# Patient Record
Sex: Female | Born: 1965 | Race: White | Hispanic: No | Marital: Married | Smoking: Never smoker
Health system: Southern US, Community
[De-identification: ages and names within clinical notes are randomized; demographics above are authoritative.]

## PROBLEM LIST (undated history)

## (undated) DIAGNOSIS — M1990 Unspecified osteoarthritis, unspecified site: Secondary | ICD-10-CM

## (undated) DIAGNOSIS — M797 Fibromyalgia: Secondary | ICD-10-CM

## (undated) DIAGNOSIS — K760 Fatty (change of) liver, not elsewhere classified: Secondary | ICD-10-CM

## (undated) DIAGNOSIS — E282 Polycystic ovarian syndrome: Secondary | ICD-10-CM

## (undated) DIAGNOSIS — I10 Essential (primary) hypertension: Secondary | ICD-10-CM

## (undated) DIAGNOSIS — J189 Pneumonia, unspecified organism: Secondary | ICD-10-CM

## (undated) DIAGNOSIS — F339 Major depressive disorder, recurrent, unspecified: Secondary | ICD-10-CM

## (undated) DIAGNOSIS — K829 Disease of gallbladder, unspecified: Secondary | ICD-10-CM

## (undated) DIAGNOSIS — E6609 Other obesity due to excess calories: Secondary | ICD-10-CM

## (undated) DIAGNOSIS — R112 Nausea with vomiting, unspecified: Secondary | ICD-10-CM

## (undated) DIAGNOSIS — E785 Hyperlipidemia, unspecified: Secondary | ICD-10-CM

## (undated) DIAGNOSIS — K869 Disease of pancreas, unspecified: Secondary | ICD-10-CM

## (undated) DIAGNOSIS — R0602 Shortness of breath: Secondary | ICD-10-CM

## (undated) DIAGNOSIS — Z9889 Other specified postprocedural states: Secondary | ICD-10-CM

## (undated) DIAGNOSIS — J45909 Unspecified asthma, uncomplicated: Secondary | ICD-10-CM

## (undated) DIAGNOSIS — F32A Depression, unspecified: Secondary | ICD-10-CM

## (undated) DIAGNOSIS — E119 Type 2 diabetes mellitus without complications: Secondary | ICD-10-CM

## (undated) DIAGNOSIS — F419 Anxiety disorder, unspecified: Secondary | ICD-10-CM

## (undated) DIAGNOSIS — E559 Vitamin D deficiency, unspecified: Secondary | ICD-10-CM

## (undated) DIAGNOSIS — M2550 Pain in unspecified joint: Secondary | ICD-10-CM

## (undated) HISTORY — DX: Hyperlipidemia, unspecified: E78.5

## (undated) HISTORY — DX: Disease of gallbladder, unspecified: K82.9

## (undated) HISTORY — DX: Fatty (change of) liver, not elsewhere classified: K76.0

## (undated) HISTORY — DX: Disease of pancreas, unspecified: K86.9

## (undated) HISTORY — PX: ABDOMINAL HYSTERECTOMY: SHX81

## (undated) HISTORY — DX: Major depressive disorder, recurrent, unspecified: F33.9

## (undated) HISTORY — DX: Vitamin D deficiency, unspecified: E55.9

## (undated) HISTORY — DX: Unspecified asthma, uncomplicated: J45.909

## (undated) HISTORY — DX: Unspecified osteoarthritis, unspecified site: M19.90

## (undated) HISTORY — DX: Other obesity due to excess calories: E66.09

## (undated) HISTORY — DX: Polycystic ovarian syndrome: E28.2

## (undated) HISTORY — PX: TOTAL ABDOMINAL HYSTERECTOMY: SHX209

## (undated) HISTORY — DX: Essential (primary) hypertension: I10

## (undated) HISTORY — DX: Shortness of breath: R06.02

## (undated) HISTORY — PX: REDUCTION MAMMAPLASTY: SUR839

## (undated) HISTORY — DX: Pain in unspecified joint: M25.50

## (undated) HISTORY — DX: Fibromyalgia: M79.7

## (undated) HISTORY — DX: Depression, unspecified: F32.A

---

## 1993-07-12 HISTORY — PX: GALLBLADDER SURGERY: SHX652

## 2003-03-13 ENCOUNTER — Encounter: Admission: RE | Admit: 2003-03-13 | Discharge: 2003-03-13 | Payer: Self-pay | Admitting: Family Medicine

## 2003-03-13 ENCOUNTER — Encounter: Payer: Self-pay | Admitting: Family Medicine

## 2003-06-18 ENCOUNTER — Encounter (INDEPENDENT_AMBULATORY_CARE_PROVIDER_SITE_OTHER): Payer: Self-pay

## 2003-06-18 ENCOUNTER — Inpatient Hospital Stay (HOSPITAL_COMMUNITY): Admission: RE | Admit: 2003-06-18 | Discharge: 2003-06-21 | Payer: Self-pay | Admitting: *Deleted

## 2003-06-18 ENCOUNTER — Encounter (INDEPENDENT_AMBULATORY_CARE_PROVIDER_SITE_OTHER): Payer: Self-pay | Admitting: *Deleted

## 2003-06-22 ENCOUNTER — Inpatient Hospital Stay (HOSPITAL_COMMUNITY): Admission: AD | Admit: 2003-06-22 | Discharge: 2003-06-22 | Payer: Self-pay | Admitting: Obstetrics and Gynecology

## 2003-07-13 LAB — HM COLONOSCOPY

## 2003-10-17 ENCOUNTER — Encounter: Admission: RE | Admit: 2003-10-17 | Discharge: 2003-10-17 | Payer: Self-pay | Admitting: Internal Medicine

## 2003-12-19 ENCOUNTER — Ambulatory Visit (HOSPITAL_COMMUNITY): Admission: RE | Admit: 2003-12-19 | Discharge: 2003-12-19 | Payer: Self-pay | Admitting: Obstetrics and Gynecology

## 2004-01-10 ENCOUNTER — Encounter: Admission: RE | Admit: 2004-01-10 | Discharge: 2004-01-10 | Payer: Self-pay | Admitting: Gastroenterology

## 2004-01-29 ENCOUNTER — Emergency Department (HOSPITAL_COMMUNITY): Admission: EM | Admit: 2004-01-29 | Discharge: 2004-01-30 | Payer: Self-pay | Admitting: Unknown Physician Specialty

## 2004-02-04 ENCOUNTER — Ambulatory Visit (HOSPITAL_COMMUNITY): Admission: RE | Admit: 2004-02-04 | Discharge: 2004-02-04 | Payer: Self-pay | Admitting: Gastroenterology

## 2004-02-04 ENCOUNTER — Encounter (INDEPENDENT_AMBULATORY_CARE_PROVIDER_SITE_OTHER): Payer: Self-pay | Admitting: *Deleted

## 2007-02-24 ENCOUNTER — Ambulatory Visit (HOSPITAL_COMMUNITY): Admission: RE | Admit: 2007-02-24 | Discharge: 2007-02-24 | Payer: Self-pay | Admitting: Emergency Medicine

## 2009-04-24 ENCOUNTER — Other Ambulatory Visit: Admission: RE | Admit: 2009-04-24 | Discharge: 2009-04-24 | Payer: Self-pay | Admitting: Family Medicine

## 2010-11-27 NOTE — Op Note (Signed)
NAME:  Megan Murray, Megan Murray                    ACCOUNT NO.:  [id number]   MEDICAL RECORD NO.:  [id number]                   PATIENT TYPE:  INP   LOCATION:  NA                                   FACILITY:  [facility]   PHYSICIAN:  Tracie Harrier, M.D.              DATE OF BIRTH:  02/28/1966   DATE OF PROCEDURE:  06/18/2003  DATE OF DISCHARGE:                                 OPERATIVE REPORT   PREOPERATIVE DIAGNOSES:  1. Right pelvic mass.  2. Abnormal uterine bleeding.   POSTOPERATIVE DIAGNOSIS:  Benign right ovarian mucinous cystadenoma.   PROCEDURES:  1. Total abdominal hysterectomy and right salpingo-oophorectomy.  2. Pelvic washings.   SURGEON:  Tracie Harrier, M.D.   ASSISTANTFreddy Finner, M.D.   ANESTHESIA:  General.   ESTIMATED BLOOD LOSS:  450 mL.   COMPLICATIONS:  None.   FINDINGS:  At time of laparotomy, a right ovarian neoplasm was encountered,  12 x 12 cm in size.  This was smooth-surfaced but complex on the inside.  Frozen section showed a benign mucinous cystadenoma.  The left ovary was  normal.  The uterus was likewise normal.   The abdomen otherwise was normal to gross inspection.  The appendix was not  visualized and appeared to be retrocecal.   DESCRIPTION OF PROCEDURE:  The patient was taken to the operating room,  where a general endotracheal anesthetic was administered.  The patient was  placed on the operating table, and the abdomen, perineum, and vagina were  prepped and draped in the usual sterile fashion with Betadine and sterile  drapes.  A Foley catheter was inserted.  The abdomen was entered through a  vertical incision and carried down sharply in the usual fashion.  The  peritoneum was atraumatically entered.  The above-noted findings were  encountered.  Pelvic washings were obtained.  The pelvic mass was gently  elevated into the incision.  The infundibulopelvic ligament was clamped and  the right ovary dissected free.  This was elevated  well away from the  ureter.  The infundibulopelvic ligament was then tied with two free ties of  0 Vicryl suture.  This was then submitted for frozen section.  Frozen  section showed a benign mucinous cystadenoma.  Attention was then turned to  hysterectomy.   The uterus was grasped in its cornual regions with two large Kelly clamps.  The retroperitoneal space was entered by transecting the round ligament.  The uterine-ovarian ligaments were then cauterized thoroughly using the  LigaSure device.  Successive bites were then carried down the body of the  uterus using the LigaSure device.  Once we were past the uterine artery  pedicles, curved Heaney clamps were then used to continue the hysterectomy  with dissection occurring along the border of the uterus.  Dissection was  carried out until the vagina was met.  All vascular pedicles were created  very close to the uterine body.  Vascular pedicles were ligated sharply and  suture ligated with 0 Vicryl suture until the vaginal angles were  encountered.  Curved Heaney clamps were then placed on the vaginal angles  and the uterus dissected free.  The vaginal angles were closed with  transfixing sutures of 0 Vicryl.  The vagina was closed with multiple  interrupted sutures of 0 Vicryl.  The pelvis was then thoroughly irrigated  and hemostasis achieved.  Again frozen section showed benign serous  cystadenoma.  The left ovary was normal.  Attention was then turned to  closure.  The pelvis was thoroughly irrigated with hemostasis noted.   All abdominal instruments and packs were removed.  The rectus muscle was  then closed with a running suture of 0 PDS.  The peritoneum was closed with  one ligature in the midline of the incision with 0 Vicryl prior to this.  The fascia was closed with two running sutures placed at the periphery of  the incision and with two running stitches meeting in the midline.  The  subcutaneous tissue was irrigated and made  hemostatic using the Bovie  cautery.  The subcu was then closed with three interrupted sutures of #1  Vicryl.  The skin was then reapproximated with staples.  The final sponge,  needle, and instrument count was correct x3.  The patient received a  preoperative antibiotic.  The patient was then awakened, extubated, and then  taken to the recovery room in good condition.  There were no perioperative  complications.                                               Tracie Harrier, M.D.    REG/MEDQ  D:  06/18/2003  T:  06/18/2003  Job:  [id number]

## 2010-11-27 NOTE — Discharge Summary (Signed)
NAME:  Megan Murray, Megan Murray                    ACCOUNT NO.:  [id number]   MEDICAL RECORD NO.:  [id number]                   PATIENT TYPE:  INP   LOCATION:  9311                                 FACILITY:  [facility]   PHYSICIAN:  Tracie Harrier, M.D.              DATE OF BIRTH:  10/28/1965   DATE OF ADMISSION:  06/18/2003  DATE OF DISCHARGE:  06/21/2003                                 DISCHARGE SUMMARY   HISTORY OF PRESENT ILLNESS:  Mrs. Megan Murray is a 45 year old female gravida 2  para 1 admitted for surgical intervention for a right pelvic mass.  She  underwent evaluation for this which revealed a benign process.  However,  this was a 13 cm right pelvic mass with a normal CA125.  She requested  abdominal hysterectomy at time of surgery.   PAST MEDICAL HISTORY AND SURGICAL HISTORY:  Please see clinic admission  History and Physical.   CURRENT MEDICATIONS:  1. Neurontin.  2. Ambien.  3. Elavil.  4. Zoloft.  5. Wellbutrin.  6. Albuterol p.r.n.   ALLERGIES:  None known.   PHYSICAL EXAMINATION:  Please see clinic admission History and Physical.   ADMITTING DIAGNOSIS:  Pelvic mass.   HOSPITAL COURSE:  The patient was admitted on the same day of surgery -  June 18, 2003 - where she underwent a total abdominal hysterectomy and  right salpingo-oophorectomy.  Intraoperative findings were consistent with a  benign mucinous cystadenoma.  A TAH and RSO was performed and went  uneventfully.   The patient's postoperative course was likewise uneventful.  She was slowly  advanced to a regular diet which she was tolerating well prior to discharge.  She quickly resumed normal bowel and bladder function.  Her hemoglobin  stabilized at 11.6.   Final pathology showed a benign right ovarian mucinous cystadenoma with no  evidence of carcinoma.  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   The patient did do well and was discharged routinely on postoperative day #3  in stable  condition.   DISCHARGE DIAGNOSIS:  Right pelvic mass, now status post total abdominal  hysterectomy and right salpingo-oophorectomy.   PLAN:  1. Home.  2. Percocet #50.  3. Motrin #30.  4. Routine postoperative instructions given.  5. Follow up in the office in 1 week for a routine postoperative checkup and     incision check.  She will have staples removed at that time.                                               Tracie Harrier, M.D.    REG/MEDQ  D:  07/24/2003  T:  07/24/2003  Job:  [id number]

## 2010-11-27 NOTE — Op Note (Signed)
NAME:  Megan Murray, Megan Murray                         ACCOUNT NO.:  [id number]   MEDICAL RECORD NO.:  [id number]                   PATIENT TYPE:  AMB   LOCATION:  ENDO                                 FACILITY:  [facility]   PHYSICIAN:  James L. Malon Kindle., M.D.          DATE OF BIRTH:  08-29-1965   DATE OF PROCEDURE:  02/04/2004  DATE OF DISCHARGE:                                 OPERATIVE REPORT   PROCEDURE:  Colonoscopy and biopsy.   MEDICATIONS:  Fentanyl 140 mcg and Versed 10 mg IV.   INDICATIONS FOR PROCEDURE:  Persistent abdominal pain and diarrhea.  This  was done to rule out inflammatory bowel disease.   DESCRIPTION OF PROCEDURE:  The procedure had been explained to the patient  and consent obtained.  Left lateral decubitus position, the Olympus scope  was inserted and advanced.  We were able to advance easily to the cecum.  The ileocecal valve and appendiceal orifice were seen and the scope was  withdrawn and the cecum, ascending colon, transverse colon, splenic flexure,  descending and sigmoid colon were seen well.  There were no polyps, no other  lesions.  The mucosa was completely endoscopically normal throughout,  although it was slightly friable.  Multiple random biopsies were obtained.  Rectum was free of polyps or other lesions.  There was no significant  diverticular disease.   ASSESSMENT:  Diarrhea, questionable cause, may be functional, 564.5.  Will  rule out microscopic colitis.   PLAN:  Will check pathology and see back in the office in four to six weeks.                                               James L. Malon Kindle., M.D.    Waldron Session  D:  02/04/2004  T:  02/04/2004  Job:  [id number]   cc:   Fayrene Fearing A. Ashley Royalty, M.D.  [address]  [place], [place] [address]  Fax: [phone]   Teena Irani. Arlyce Dice, M.D.  [address]  [place]  [place] [address]  Fax: [phone]

## 2010-11-27 NOTE — H&P (Signed)
NAME:  Megan, Murray NO.:  [id number]   MEDICAL RECORD NO.:  [id number]                   PATIENT TYPE:  INP   LOCATION:  NA                                   [facility]   PHYSICIAN:  Tracie Harrier, M.D.              DATE OF BIRTH:  05-05-1966   DATE OF ADMISSION:  06/17/2003  DATE OF DISCHARGE:                                HISTORY & PHYSICAL   HISTORY OF PRESENT ILLNESS:  [name] is a 45 year old female gravida 2,  para 1, admitted after clinical findings of a pelvic mass.  The patient was  seen by her local medical physician where she was having irregular menstrual  cycles.  Examination at that time showed a pelvic mass.  An ultrasound at  [facility] showed a right pelvic mass 13 cm in size.  This was a  complex mass of the right adnexa, possibly and ovarian neoplasm.  The  patient was seen in my office on June 12, 2003 where she underwent  discussion, review of labs, and CA-125.  Again there is a right pelvic mass 13 cm in size.  The CA-125 obtained was  normal at 24.   Different treatment options were reviewed with the patient.  This is a well  circumscribed entity with a normal CA-125, given her age it is unlikely to  be a carcinoma.  However her surgery will be approached with that  possibility in mind.   After discussion with the patient she is admitted for laparotomy with  vertical incision with right oophorectomy, frozen section and definitive  abdominal hysterectomy, possible left salpingo-oophorectomy as well.  Again  this clinical process discussed with her at length.   PAST MEDICAL HISTORY:  1. History of fibromyalgia.  2. History of depression.  3. History of irritable bowel syndrome.  4. History of asthma.   PAST SURGICAL HISTORY:  1. Cesarean section.  2. Breast reduction.  3. Cholecystectomy.  4. Sinus surgery.   OBSTETRIC HISTORY:  1. Cesarean section times one.  2. Abortion times one.   CURRENT MEDICATIONS:  1. Neurontin.  2. Ambien.  3. Elavil.  4. Zoloft.  5. Wellbutrin.  6. Albuterol p.r.n.   SOCIAL HISTORY:  Negative for alcohol or smoking.   ALLERGIES:  None known.   PHYSICAL EXAMINATION:  VITAL SIGNS:  Stable.  GENERAL:  She is a well-developed, well-nourished female in no acute  distress.  HEENT:  Within normal limits.  NECK:  Supple without adenopathy or thyromegaly.  HEART:  Regular rate and rhythm without murmur, rub, or gallop.  LUNGS:  Clear without wheezing.  BREAST EXAM:  Deferred.  ABDOMEN:  Soft and benign without masses, tenderness or organomegaly.  PELVIC EXAM:  Normal external female genitalia, vagina, cervix clear.  The  uterus is enlarged, it is difficult to ascertain what is enlarged but there  is an enlarged presence about 10 cm in size to the right.  The adnexa on the  left is clear.  EXTREMITIES:  Grossly normal.  NEUROLOGIC:  Grossly normal.   ADMISSION DIAGNOSIS:  Pelvic mass.   PLAN:  1. Laparotomy with right salpingo-oophorectomy and frozen section.  2. Then, abdominal hysterectomy.  3. Left salpingo-oophorectomy and surgical staging if malignancy.   DISCUSSION:  The risk and benefits as well as clinical implications of this  pelvic mass discussed.  Again I discussed the clinical approach to this.  She definitely wants abdominal hysterectomy which I will respect.  The risk  of bleeding, infection, risk of injury to surrounding organs and pulmonary  embolism and deep venous thrombosis discussed with her.  Questions were  answered regarding this surgery.  Her Pap smear is pending, however she has  no history of abnormal Pap smear.                                               Tracie Harrier, M.D.    REG/MEDQ  D:  06/17/2003  T:  06/18/2003  Job:  [id number]

## 2011-05-13 ENCOUNTER — Institutional Professional Consult (permissible substitution): Payer: Self-pay | Admitting: Internal Medicine

## 2012-06-19 ENCOUNTER — Other Ambulatory Visit: Payer: Self-pay | Admitting: Family Medicine

## 2012-06-19 DIAGNOSIS — Z1231 Encounter for screening mammogram for malignant neoplasm of breast: Secondary | ICD-10-CM

## 2012-07-06 ENCOUNTER — Ambulatory Visit: Payer: Self-pay

## 2012-07-31 ENCOUNTER — Ambulatory Visit
Admission: RE | Admit: 2012-07-31 | Discharge: 2012-07-31 | Disposition: A | Discharge status: Home / Self Care | Payer: Medicare Other | Source: Ambulatory Visit | Attending: Family Medicine | Admitting: Family Medicine

## 2012-07-31 DIAGNOSIS — Z1231 Encounter for screening mammogram for malignant neoplasm of breast: Secondary | ICD-10-CM

## 2012-09-05 ENCOUNTER — Other Ambulatory Visit: Payer: Self-pay | Admitting: Family Medicine

## 2012-09-05 DIAGNOSIS — R109 Unspecified abdominal pain: Secondary | ICD-10-CM

## 2012-09-05 DIAGNOSIS — R1031 Right lower quadrant pain: Secondary | ICD-10-CM

## 2012-09-06 ENCOUNTER — Ambulatory Visit
Admission: RE | Admit: 2012-09-06 | Discharge: 2012-09-06 | Disposition: A | Discharge status: Home / Self Care | Payer: Medicare Other | Source: Ambulatory Visit | Attending: Family Medicine | Admitting: Family Medicine

## 2012-09-06 ENCOUNTER — Other Ambulatory Visit: Payer: Self-pay | Admitting: Family Medicine

## 2012-09-06 DIAGNOSIS — R1031 Right lower quadrant pain: Secondary | ICD-10-CM

## 2012-09-06 DIAGNOSIS — R109 Unspecified abdominal pain: Secondary | ICD-10-CM

## 2012-09-06 MED ORDER — IOHEXOL 300 MG/ML  SOLN
100.0000 mL | Freq: Once | INTRAMUSCULAR | Status: AC | PRN
  Administered 2012-09-06: 100 mL via INTRAVENOUS

## 2013-08-02 ENCOUNTER — Ambulatory Visit (INDEPENDENT_AMBULATORY_CARE_PROVIDER_SITE_OTHER): Payer: Medicare Other

## 2013-08-02 ENCOUNTER — Ambulatory Visit (INDEPENDENT_AMBULATORY_CARE_PROVIDER_SITE_OTHER): Payer: Medicare Other | Admitting: Podiatrist

## 2013-08-02 ENCOUNTER — Encounter: Payer: Self-pay | Admitting: Podiatrist

## 2013-08-02 VITALS — BP 155/90 | HR 86 | Resp 12 | Ht 61.0 in | Wt 194.0 lb

## 2013-08-02 DIAGNOSIS — M722 Plantar fascial fibromatosis: Secondary | ICD-10-CM

## 2013-08-02 DIAGNOSIS — R52 Pain, unspecified: Secondary | ICD-10-CM

## 2013-08-02 NOTE — Progress Notes (Signed)
   Subjective:    Patient ID: Megan Murray, female    DOB: 04/28/1966, 48 y.o.   MRN: [id number]  HPI Comments: '' BOTH HEELS ARE SWOLLEN AND HURTING FOR 3 WEEKS. TREATMENT TRIED ORTHOTICS AND ALEVE FOR PAIN BUT NOTHING IS HELPING.''     Review of Systems  Constitutional: Positive for fatigue.  Musculoskeletal: Positive for joint swelling.  Neurological: Positive for weakness.  Psychiatric/Behavioral: The patient is nervous/anxious.   All other systems reviewed and are negative.       Objective:   Physical Exam GENERAL APPEARANCE: Alert, conversant. Appropriately groomed. No acute distress.  VASCULAR: Pedal pulses palpable and strong bilateral.  Capillary refill time is immediate to all digits,  Proximal to distal cooling it warm to warm.  Digital hair growth is present bilateral  NEUROLOGIC: sensation is intact epicritically and protectively to 5.07 monofilament at 5/5 sites bilateral.  Light touch is intact bilateral, vibratory sensation intact bilateral, achilles tendon reflex is intact bilateral.  MUSCULOSKELETAL: Pain on palpation plantar medial aspect of bilateral heels is noted consistent with plantar fasciitis symptomatology. Swelling is also present of bilateral heels.  DERMATOLOGIC: skin color, texture, and turger are within normal limits.  No preulcerative lesions are seen, no interdigital maceration noted.  No open lesions present.  Digital nails are asymptomatic.   X-rays demonstrate inferior and posterior calcaneal spurring bilateral.       Assessment & Plan:  Plantar fasciitis bilateral,   Discussed etiology, pathology, conservative vs. Surgical therapies and at this time a plantar fascial injection was recommended.  The patient agreed and a sterile skin prep was applied.  An injection consisting of kenalog and marcaine mixture was infiltrated at the point of maximal tenderness on the bilateral Heels.  The patient tolerated this well and was given instructions for  aftercare. She was then given instructions for stretching exercises and shoe gear changes. She has inserts from the [employer] store and I encouraged continued use. If these are not beneficial we will consider custom devices.

## 2013-08-02 NOTE — Patient Instructions (Signed)
Wear a good fitting shoe-  Running shoe brands I recommend are [employer], [employer] and [employer].  Always have a shoe fit specialist help you choose your shoes as there are many "varieties" of shoes and they can find you the best fit.  [employer] sports/ [employer] ([place], [place] [employer], [employer] [place] have trained staff to help you in this process.  The [employer] in [place] has a great selection of euro-comfort casual shoes with good comfort and support as well.  A night splint is also good for helping with plantar fasciitis  Plantar Fasciitis (Heel Spur Syndrome) with Rehab The plantar fascia is a fibrous, ligament-like, soft-tissue structure that spans the bottom of the foot. Plantar fasciitis is a condition that causes pain in the foot due to inflammation of the tissue. SYMPTOMS   Pain and tenderness on the underneath side of the foot.  Pain that worsens with standing or walking. CAUSES  Plantar fasciitis is caused by irritation and injury to the plantar fascia on the underneath side of the foot. Common mechanisms of injury include:  Direct trauma to bottom of the foot.  Damage to a small nerve that runs under the foot where the main fascia attaches to the heel bone. Stress placed on the plantar fascia due to any mild increased activity or injury RISK INCREASES WITH:   Obesity.  Poor strength and flexibility.  Improperly fitted shoes.  Tight calf muscles.  Flat feet.  Failure to warm-up properly before activity.  PREVENTION  Warm up and stretch properly before activity.  Strength, flexibility  Maintain a health body weight.  Avoid stress on the plantar fascia.  Wear properly fitted shoes, including arch supports for individuals who have flat feet. PROGNOSIS  If treated properly, then the symptoms of plantar fasciitis usually resolve without surgery. However, occasionally surgery is necessary. RELATED COMPLICATIONS    Recurrent symptoms that may result in a chronic condition.  Problems of the lower back that are caused by compensating for the injury, such as limping.  Pain or weakness of the foot during push-off following surgery.  Chronic inflammation, scarring, and partial or complete fascia tear, occurring more often from repeated injections. TREATMENT  Treatment initially involves the use of ice and medication to help reduce pain and inflammation. The use of strengthening and stretching exercises may help reduce pain with activity, especially stretches of the Achilles tendon.  Your caregiver may recommend that you use arch supports to help reduce stress on the plantar fascia. Often, corticosteroid injections are given to reduce inflammation. If symptoms persist for greater than 6 months despite non-surgical (conservative), then surgery may be recommended.  MEDICATION   If pain medication is necessary, then nonsteroidal anti-inflammatory medications, such as aspirin and ibuprofen, or other minor pain relievers, such as acetaminophen, are often recommended. Corticosteroid injections may be given by your caregiver.  HEAT AND COLD  Cold treatment (icing) relieves pain and reduces inflammation. Cold treatment should be applied for 10 to 15 minutes every 2 to 3 hours for inflammation and pain and immediately after any activity that aggravates your symptoms. Use ice packs or massage the area with a piece of ice (ice massage).  Heat treatment may be used prior to performing the stretching and strengthening activities prescribed by your caregiver, physical therapist, or athletic trainer. Use a heat pack or soak the injury in warm water. SEEK IMMEDIATE MEDICAL CARE IF:  Treatment seems to offer no benefit, or the condition worsens.  Any medications  produce adverse side effects.    EXERCISES-- perform each exercise a total of 10-15 repetitions.  Hold for 30 seconds and perform 3 times per day   RANGE OF  MOTION (ROM) AND STRETCHING EXERCISES - Plantar Fasciitis (Heel Spur Syndrome) These exercises may help you when beginning to rehabilitate your injury.   While completing these exercises, remember:   Restoring tissue flexibility helps normal motion to return to the joints. This allows healthier, less painful movement and activity.  An effective stretch should be held for at least 30 seconds.  A stretch should never be painful. You should only feel a gentle lengthening or release in the stretched tissue. RANGE OF MOTION - Toe Extension, Flexion  Sit with your right / left leg crossed over your opposite knee.  Grasp your toes and gently pull them back toward the top of your foot. You should feel a stretch on the bottom of your toes and/or foot.  Hold this stretch for __________ seconds.  Now, gently pull your toes toward the bottom of your foot. You should feel a stretch on the top of your toes and or foot.  Hold this stretch for __________ seconds. Repeat __________ times. Complete this stretch __________ times per day.  RANGE OF MOTION - Ankle Dorsiflexion, Active Assisted  Remove shoes and sit on a chair that is preferably not on a carpeted surface.  Place right / left foot under knee. Extend your opposite leg for support.  Keeping your heel down, slide your right / left foot back toward the chair until you feel a stretch at your ankle or calf. If you do not feel a stretch, slide your bottom forward to the edge of the chair, while still keeping your heel down.  Hold this stretch for __________ seconds. Repeat __________ times. Complete this stretch __________ times per day.  STRETCH  Gastroc, Standing  Place hands on wall.  Extend right / left leg, keeping the front knee somewhat bent.  Slightly point your toes inward on your back foot.  Keeping your right / left heel on the floor and your knee straight, shift your weight toward the wall, not allowing your back to arch.  You  should feel a gentle stretch in the right / left calf. Hold this position for __________ seconds. Repeat __________ times. Complete this stretch __________ times per day. STRETCH  Soleus, Standing  Place hands on wall.  Extend right / left leg, keeping the other knee somewhat bent.  Slightly point your toes inward on your back foot.  Keep your right / left heel on the floor, bend your back knee, and slightly shift your weight over the back leg so that you feel a gentle stretch deep in your back calf.  Hold this position for __________ seconds. Repeat __________ times. Complete this stretch __________ times per day. STRETCH  Gastrocsoleus, Standing  Note: This exercise can place a lot of stress on your foot and ankle. Please complete this exercise only if specifically instructed by your caregiver.   Place the ball of your right / left foot on a step, keeping your other foot firmly on the same step.  Hold on to the wall or a rail for balance.  Slowly lift your other foot, allowing your body weight to press your heel down over the edge of the step.  You should feel a stretch in your right / left calf.  Hold this position for __________ seconds.  Repeat this exercise with a slight bend in  your right / left knee. Repeat __________ times. Complete this stretch __________ times per day.  STRENGTHENING EXERCISES - Plantar Fasciitis (Heel Spur Syndrome)  These exercises may help you when beginning to rehabilitate your injury. They may resolve your symptoms with or without further involvement from your physician, physical therapist or athletic trainer. While completing these exercises, remember:   Muscles can gain both the endurance and the strength needed for everyday activities through controlled exercises.  Complete these exercises as instructed by your physician, physical therapist or athletic trainer. Progress the resistance and repetitions only as guided.

## 2013-08-24 ENCOUNTER — Ambulatory Visit (INDEPENDENT_AMBULATORY_CARE_PROVIDER_SITE_OTHER): Payer: Medicare Other | Admitting: Podiatrist

## 2013-08-24 ENCOUNTER — Encounter: Payer: Self-pay | Admitting: Podiatrist

## 2013-08-24 VITALS — BP 128/80 | HR 86 | Resp 12

## 2013-08-24 DIAGNOSIS — M722 Plantar fascial fibromatosis: Secondary | ICD-10-CM

## 2013-08-24 NOTE — Progress Notes (Signed)
''   BOTH FOOT ARE STILL HURTING.''  Patent presents for follow up plantar fasciisit bilateral.  States she still has pain but it is much improved from injection therapy.  Relates continued pain with first step in morning.  Objective:  Neurovascular status unchanged and intact.  Continued plantar fasciitis symptomatology is present bilaterally.    Assessment:  Plantar fasciitis  Plan:  Injected bilateral heels with kenalog and marcaine mix for injection 2.  Recommended a night splint as well as an ice pack and continueation of stretching exercises.  Discussed the positive Bacci term benefits of orthotics. Will verify orthotic coverage and notify patient.  She will be seen back for follow up in 3 weeks.

## 2013-08-24 NOTE — Progress Notes (Deleted)
Injections on both heels, night splint with ice pack

## 2013-09-19 ENCOUNTER — Ambulatory Visit: Payer: Medicare Other | Admitting: Podiatrist

## 2013-11-26 ENCOUNTER — Encounter: Payer: Self-pay | Admitting: Podiatry

## 2013-11-26 ENCOUNTER — Ambulatory Visit (INDEPENDENT_AMBULATORY_CARE_PROVIDER_SITE_OTHER): Payer: Medicare Other | Admitting: Podiatry

## 2013-11-26 VITALS — BP 149/87 | HR 74 | Resp 16

## 2013-11-26 DIAGNOSIS — M722 Plantar fascial fibromatosis: Secondary | ICD-10-CM

## 2013-11-26 NOTE — Progress Notes (Signed)
Subjective:     Patient ID: Megan SalesLaurie K Llorente, female   DOB: 01/30/1966, 48 y.o.   MRN: [id number]  HPI patient presents stating my heel started to hurt me in the last week and I'm not sure what I've done. They have never really gotten better I feel like I don't have enough support and the night splint helps 1 foot at a needed for both   Review of Systems     Objective:   Physical Exam Neurovascular status intact with no health history changes noted in patient's noted to have pain in the plantar of both heels with slight discoloration on the medial side spontaneously on both heels.    Assessment:     Chronic plantar fasciitis which has not responded so far to conservative care    Plan:     I don't believe these are reactions to steroid injections but I cannot take that chance I'm not going to reinject them. I dispensed a second night splint scanned for customized orthotics and discussed Chohan-term possibility for shockwave therapy which I think would be an excellent modality for this patient

## 2013-11-27 ENCOUNTER — Telehealth: Payer: Self-pay | Admitting: *Deleted

## 2013-11-27 NOTE — Telephone Encounter (Signed)
Message copied by Enedina FinnerMEADOWS, Boden Stucky J on Tue Nov 27, 2013  3:59 PM ------      Message from: Darreld McleanSMITH, JESSICA M      Created: Tue Nov 27, 2013  1:15 PM      Regarding: Patient in Severe Pain      Contact: [phone]       Patient saw Dr. Charlsie Merlesegal yesterday. States she is in severe pain. Requests a call back today. ------

## 2013-11-27 NOTE — Telephone Encounter (Signed)
i can call her in a prednisone dose kit for her-- I don't do narcotic for plantar fasciitis (but I don't think she is asking for that anyway)  Ask if she has ever had a problem with prednisone pills and if not, call in a prednisone 10 mg taper kit.  That will be immediate relief until she can get the inserts back.    I couldn't really tell if she got an injection from her note-- but it may be a steroid flare if she did and that would go away by tomorrow.

## 2013-11-27 NOTE — Telephone Encounter (Signed)
Yesterday they worked me in.  I have bruises on the bottom of both of my feet.  I saw Dr. Charlsie Merlesegal.  I'm in worse pain today.  I can't wait 7 weeks, he said he wants me to get orthotics but it will take 2 weeks to get them.  Then he wants me to wear them for 5 weeks.  I had to leave work today they hurt so bad.  I feel like he was just blowing me off.  I feel like I wasted a $50 co-pay because I left out of there knowing nothing different.   I have Fibromyalgia on top of the Plantar Fasciitis.  I take Tramadol for it already.  Is there something else that Dr. Irving ShowsEgerton can do for me, she's who I normally see?  I have to have some type of relief, I can't go to work like this.  I told her I would try and get in contact with Dr. Irving ShowsEgerton and get back with her with a response.

## 2013-11-27 NOTE — Telephone Encounter (Signed)
I called and gave her Dr. Faylene MillionEgerton's response.  She stated she's Diabetic,  is it okay for her to take a steroid?  I told her I would have to check with Dr. Irving ShowsEgerton and call her back.

## 2013-11-27 NOTE — Telephone Encounter (Signed)
Its OK but not ideal--- ask if her blood sugar is OK--  Instead of the 10's kit, please call in the prednisone 5 mg and just have her take 1 tab daily for 5 days   OR-- we can up her tramadol.Marland Kitchen

## 2013-11-28 MED ORDER — METHYLPREDNISOLONE 4 MG PO TABS: 4.0000 mg | ORAL_TABLET | Freq: Every day | ORAL | Status: DC

## 2013-11-28 NOTE — Telephone Encounter (Signed)
I called and informed the patient that Dr. Irving ShowsEgerton said if her glucose is under control it should be okay.  She stated it's under control with medicine.  I told her we would send the prescription in to the pharmacy.  She said to go ahead but she's going to check with her doctor before she takes the prednisone.  I told her that's fine.

## 2013-12-10 ENCOUNTER — Other Ambulatory Visit: Payer: Medicare Other

## 2014-01-02 ENCOUNTER — Ambulatory Visit: Payer: Medicare Other | Admitting: Podiatrist

## 2014-07-24 ENCOUNTER — Other Ambulatory Visit: Payer: Self-pay

## 2014-07-24 DIAGNOSIS — Z1231 Encounter for screening mammogram for malignant neoplasm of breast: Secondary | ICD-10-CM

## 2014-08-02 ENCOUNTER — Ambulatory Visit: Payer: Self-pay

## 2014-08-06 ENCOUNTER — Ambulatory Visit: Payer: Self-pay

## 2015-01-23 DIAGNOSIS — E538 Deficiency of other specified B group vitamins: Secondary | ICD-10-CM

## 2015-01-23 HISTORY — DX: Deficiency of other specified B group vitamins: E53.8

## 2015-07-09 ENCOUNTER — Encounter (HOSPITAL_COMMUNITY): Payer: Self-pay | Admitting: Emergency Medicine

## 2015-07-09 ENCOUNTER — Emergency Department (HOSPITAL_COMMUNITY): Payer: Medicare Other

## 2015-07-09 ENCOUNTER — Emergency Department (HOSPITAL_COMMUNITY)
Admission: EM | Admit: 2015-07-09 | Discharge: 2015-07-09 | Disposition: A | Discharge status: Home / Self Care | Payer: Medicare Other | Attending: Emergency Medicine | Admitting: Emergency Medicine

## 2015-07-09 DIAGNOSIS — Z7952 Long term (current) use of systemic steroids: Secondary | ICD-10-CM | POA: Diagnosis not present

## 2015-07-09 DIAGNOSIS — W010XXA Fall on same level from slipping, tripping and stumbling without subsequent striking against object, initial encounter: Secondary | ICD-10-CM | POA: Insufficient documentation

## 2015-07-09 DIAGNOSIS — Y998 Other external cause status: Secondary | ICD-10-CM | POA: Diagnosis not present

## 2015-07-09 DIAGNOSIS — Y9289 Other specified places as the place of occurrence of the external cause: Secondary | ICD-10-CM | POA: Insufficient documentation

## 2015-07-09 DIAGNOSIS — Y9389 Activity, other specified: Secondary | ICD-10-CM | POA: Insufficient documentation

## 2015-07-09 DIAGNOSIS — Z7984 Long term (current) use of oral hypoglycemic drugs: Secondary | ICD-10-CM | POA: Insufficient documentation

## 2015-07-09 DIAGNOSIS — E119 Type 2 diabetes mellitus without complications: Secondary | ICD-10-CM | POA: Insufficient documentation

## 2015-07-09 DIAGNOSIS — S6992XA Unspecified injury of left wrist, hand and finger(s), initial encounter: Secondary | ICD-10-CM | POA: Insufficient documentation

## 2015-07-09 HISTORY — DX: Type 2 diabetes mellitus without complications: E11.9

## 2015-07-09 MED ORDER — ACETAMINOPHEN 325 MG PO TABS
650.0000 mg | ORAL_TABLET | Freq: Once | ORAL | Status: AC
  Administered 2015-07-09: 650 mg via ORAL
  Filled 2015-07-09: qty 2

## 2015-07-09 MED ORDER — OXYCODONE-ACETAMINOPHEN 5-325 MG PO TABS
1.0000 | ORAL_TABLET | Freq: Once | ORAL | Status: DC
  Filled 2015-07-09: qty 1

## 2015-07-09 MED ORDER — IBUPROFEN 800 MG PO TABS: 800.0000 mg | ORAL_TABLET | Freq: Three times a day (TID) | ORAL | Status: DC

## 2015-07-09 MED ORDER — CYCLOBENZAPRINE HCL 10 MG PO TABS: 5.0000 mg | ORAL_TABLET | Freq: Two times a day (BID) | ORAL | Status: DC | PRN

## 2015-07-09 NOTE — ED Notes (Signed)
Tripped over chainsaw in garage , fell onto concrete floor-- left wrist hurts, slight swelling, positive radial pulse, brisk cap refill-- abrasion to left knee-- pt is ambulatory.

## 2015-07-09 NOTE — ED Notes (Signed)
Ice applied to left wrist

## 2015-07-09 NOTE — ED Notes (Signed)
Pt states she cannot take Codeine, "not even synthetic codeine".

## 2015-07-09 NOTE — Discharge Instructions (Signed)
Wrist Sprain °A wrist sprain is a stretch or tear in the strong, fibrous tissues (ligaments) that connect your wrist bones. The ligaments of your wrist may be easily sprained. There are three types of wrist sprains. °· Grade 1. The ligament is not stretched or torn, but the sprain causes pain. °· Grade 2. The ligament is stretched or partially torn. You may be able to move your wrist, but not very much. °· Grade 3. The ligament or muscle completely tears. You may find it difficult or extremely painful to move your wrist even a little. °CAUSES °Often, wrist sprains are a result of a fall or an injury. The force of the impact causes the fibers of your ligament to stretch too much or tear. Common causes of wrist sprains include: °· Overextending your wrist while catching a ball with your hands. °· Repetitive or strenuous extension or bending of your wrist. °· Landing on your hand during a fall. °RISK FACTORS °· Having previous wrist injuries. °· Playing contact sports, such as boxing or wrestling. °· Participating in activities in which falling is common. °· Having poor wrist strength and flexibility. °SIGNS AND SYMPTOMS °· Wrist pain. °· Wrist tenderness. °· Inflammation or bruising of the wrist area. °· Hearing a "pop" or feeling a tear at the time of the injury. °· Decreased wrist movement due to pain, stiffness, or weakness. °DIAGNOSIS °Your health care provider will examine your wrist. In some cases, an X-ray will be taken to make sure you did not break any bones. If your health care provider thinks that you tore a ligament, he or she may order an MRI of your wrist. °TREATMENT °Treatment involves resting and icing your wrist. You may also need to take pain medicines to help lessen pain and inflammation. Your health care provider may recommend keeping your wrist still (immobilized) with a splint to help your sprain heal. When the splint is no longer necessary, you may need to perform strengthening and stretching  exercises. These exercises help you to regain strength and full range of motion in your wrist. Surgery is not usually needed for wrist sprains unless the ligament completely tears. °HOME CARE INSTRUCTIONS °· Rest your wrist. Do not do things that cause pain. °· Wear your wrist splint as directed by your health care provider. °· Take medicines only as directed by your health care provider. °· To ease pain and swelling, apply ice to the injured area. °¨ Put ice in a plastic bag. °¨ Place a towel between your skin and the bag. °¨ Leave the ice on for 20 minutes, 2-3 times a day. °SEEK MEDICAL CARE IF: °· Your pain, discomfort, or swelling gets worse even with treatment. °· You feel sudden numbness in your hand. °  °This information is not intended to replace advice given to you by your health care provider. Make sure you discuss any questions you have with your health care provider. °  °Document Released: 03/01/2014 Document Reviewed: 03/01/2014 °Elsevier Interactive Patient Education ©2016 [employer]vier Inc[employer] ° °

## 2015-07-09 NOTE — ED Notes (Signed)
Ortho Tech called. 

## 2015-07-09 NOTE — Progress Notes (Signed)
Orthopedic Tech Progress Note Patient Details:  Megan Murray 10/31/1965 [id number]  Ortho Devices Type of Ortho Device: Ace wrap, Thumb spica splint, Volar splint Splint Material: Plaster Ortho Device/Splint Location: LUE Ortho Device/Splint Interventions: Ordered, Application   Jennye MoccasinHughes, Megan Murray 07/09/2015, 3:26 PM

## 2015-07-09 NOTE — ED Provider Notes (Signed)
CSN: [id number]     Arrival date & time 07/09/15  1246 History  By signing my name below, I, Essence Howell, attest that this documentation has been prepared under the direction and in the presence of Marlon Pel, PA-C Electronically Signed: Charline Bills, ED Scribe 07/09/2015 at 3:05 PM.   Chief Complaint  Patient presents with  . Wrist Injury   The history is provided by the patient. No language interpreter was used.   HPI Comments: Megan Murray is a 49 y.o. female who presents to the Emergency Department complaining of a fall that occurred approximately 2 hours ago. Pt states that she tripped over a chainsaw and landed on concrete with both hands and knees. She denies head injury or LOC. Pt reports constant left wrist pain that is exacerbated with palpation and moving her fingers. She also reports associated swelling to her left wrist and an abrasion to her left knee. Pt has tried ice since the incident. She denies elbow pain. Pt's tetanus is UTD. Allergy to Codeine.   Past Medical History  Diagnosis Date  . Diabetes mellitus without complication [facility])    Past Surgical History  Procedure Laterality Date  . Abdominal hysterectomy     No family history on file. Social History  Substance Use Topics  . Smoking status: Never Smoker   . Smokeless tobacco: None  . Alcohol Use: No   OB History    No data available     Review of Systems  Musculoskeletal: Positive for joint swelling and arthralgias.  Skin: Positive for wound.  All other systems reviewed and are negative.  Allergies  Codeine  Home Medications   Prior to Admission medications   Medication Sig Start Date End Date Taking? Authorizing Provider  cyclobenzaprine (FLEXERIL) 10 MG tablet Take 0.5-1 tablets (5-10 mg total) by mouth 2 (two) times daily as needed. 07/09/15   Yuriel Lopezmartinez Neva Seat, PA-C  estradiol (ESTRACE) 0.1 MG/GM vaginal cream Place 1 Applicatorful vaginally at bedtime.    Historical Provider, MD   ibuprofen (ADVIL,MOTRIN) 800 MG tablet Take 1 tablet (800 mg total) by mouth 3 (three) times daily. 07/09/15   Marlon Pel, PA-C  JARDIANCE 10 MG TABS  07/13/13   Historical Provider, MD  metFORMIN (GLUCOPHAGE) 500 MG tablet Take by mouth 2 (two) times daily with a meal.    Historical Provider, MD  methylPREDNISolone (MEDROL) 4 MG tablet Take 1 tablet (4 mg total) by mouth daily. 11/28/13   Delories Heinz, DPM  VICTOZA 18 MG/3ML SOPN  07/12/13   Historical Provider, MD  zolpidem (AMBIEN) 10 MG tablet  07/22/13   Historical Provider, MD   BP 158/86 mmHg  Pulse 97  Temp(Src) 99.2 F (37.3 C)  SpO2 97% Physical Exam  Constitutional: She is oriented to person, place, and time. She appears well-developed and well-nourished. No distress.  HENT:  Head: Normocephalic and atraumatic.  Eyes: Conjunctivae and EOM are normal.  Neck: Neck supple. No tracheal deviation present.  Cardiovascular: Normal rate.   Pulmonary/Chest: Effort normal. No respiratory distress.  Musculoskeletal: Normal range of motion.  Pain with ROM of left wrist. Will apply a small amount of flexion and extension to the thumb. Tenderness with movement of all 5 fingers. Tenderness is the most severe at the proximal interphalangeal joint. No ecchymosis, strong radial pulse, no deformity. Sensation intact.  Neurological: She is alert and oriented to person, place, and time.  Skin: Skin is warm and dry.  Psychiatric: She has a normal mood and affect.  Her behavior is normal.  Nursing note and vitals reviewed.  ED Course  Procedures (including critical care time) DIAGNOSTIC STUDIES: Oxygen Saturation is 97% on RA, normal by my interpretation.    COORDINATION OF CARE: 2:46 PM-Discussed treatment plan which includes XR, splint and Tylenol with pt at bedside and pt agreed to plan.   Labs Review Labs Reviewed - No data to display  Imaging Review Dg Wrist Complete Left  07/09/2015  CLINICAL DATA:  Tripped and fell over a  chain saw in her garage today, felt to her knees, caught herself with both hands, severe lateral LEFT wrist pain extending to thumb, initial encounter EXAM: LEFT WRIST - COMPLETE 3+ VIEW COMPARISON:  None FINDINGS: Osseous mineralization normal. Upper normal scapholunate interval. Joint spaces otherwise preserved. No acute fracture, dislocation, or bone destruction. IMPRESSION: No acute osseous abnormalities. Electronically Signed   By: Ulyses SouthwardMark  Boles M.D.   On: 07/09/2015 14:26   I have personally reviewed and evaluated these images and lab results as part of my medical decision-making.   EKG Interpretation None      MDM   Final diagnoses:  Wrist injury, left, initial encounter   Patient X-Ray negative of left wrist for obvious fracture or dislocation. Pain managed in ED. Pt advised to follow up with orthopedics if symptoms persist for possibility of missed fracture diagnosis. Patient given a plaster  Volar/thumb spica splint while in ED since clinically she her exam raises concern for occult fracture. Conservative therapy recommended and discussed. Patient will be dc home & is agreeable with above plan.  I personally performed the services described in this documentation, which was scribed in my presence. The recorded information has been reviewed and is accurate.     Marlon Peliffany Algis Lehenbauer, PA-C 07/09/15 1520  Raeford RazorStephen Kohut, MD 07/10/15 [phone]

## 2015-07-13 LAB — HM PAP SMEAR

## 2015-07-29 DIAGNOSIS — S60212A Contusion of left wrist, initial encounter: Secondary | ICD-10-CM | POA: Insufficient documentation

## 2015-07-29 HISTORY — DX: Contusion of left wrist, initial encounter: S60.212A

## 2015-08-29 ENCOUNTER — Ambulatory Visit
Admission: RE | Admit: 2015-08-29 | Discharge: 2015-08-29 | Disposition: A | Discharge status: Home / Self Care | Payer: Medicare Other | Source: Ambulatory Visit

## 2015-08-29 DIAGNOSIS — Z1231 Encounter for screening mammogram for malignant neoplasm of breast: Secondary | ICD-10-CM

## 2016-10-01 ENCOUNTER — Other Ambulatory Visit: Payer: Self-pay | Admitting: Family Medicine

## 2016-10-01 DIAGNOSIS — Z1231 Encounter for screening mammogram for malignant neoplasm of breast: Secondary | ICD-10-CM

## 2016-10-25 ENCOUNTER — Ambulatory Visit
Admission: RE | Admit: 2016-10-25 | Discharge: 2016-10-25 | Disposition: A | Discharge status: Home / Self Care | Payer: Medicare Other | Source: Ambulatory Visit | Attending: Family Medicine | Admitting: Family Medicine

## 2016-10-25 DIAGNOSIS — Z1231 Encounter for screening mammogram for malignant neoplasm of breast: Secondary | ICD-10-CM

## 2016-10-25 LAB — HM MAMMOGRAPHY

## 2016-11-04 ENCOUNTER — Ambulatory Visit (INDEPENDENT_AMBULATORY_CARE_PROVIDER_SITE_OTHER): Payer: Medicare Other | Admitting: Cardiology

## 2016-11-04 ENCOUNTER — Encounter: Payer: Self-pay | Admitting: Cardiology

## 2016-11-04 ENCOUNTER — Ambulatory Visit: Payer: Medicare Other | Admitting: Cardiology

## 2016-11-04 DIAGNOSIS — R030 Elevated blood-pressure reading, without diagnosis of hypertension: Secondary | ICD-10-CM

## 2016-11-04 DIAGNOSIS — E785 Hyperlipidemia, unspecified: Secondary | ICD-10-CM

## 2016-11-04 DIAGNOSIS — E782 Mixed hyperlipidemia: Secondary | ICD-10-CM | POA: Insufficient documentation

## 2016-11-04 DIAGNOSIS — E781 Pure hyperglyceridemia: Secondary | ICD-10-CM

## 2016-11-04 DIAGNOSIS — E119 Type 2 diabetes mellitus without complications: Secondary | ICD-10-CM

## 2016-11-04 HISTORY — DX: Pure hyperglyceridemia: E78.1

## 2016-11-04 HISTORY — DX: Mixed hyperlipidemia: E78.2

## 2016-11-04 MED ORDER — FENOFIBRATE 54 MG PO TABS: 54.0000 mg | ORAL_TABLET | Freq: Every day | ORAL | 6 refills | Status: DC

## 2016-11-04 NOTE — Progress Notes (Signed)
PCP: Allean Found, MD - [facility]  Clinic Note: Chief Complaint  Patient presents with  . New Patient (Initial Visit)    Pt states no Sx. hypertriglyceridemia/hyperlipidemia    HPI: Megan Murray is a 51 y.o. female who is being seen today for the evaluation of elevated Triglycerides & Cholesterol levels at the request of Merri Brunette, MD.  Interval History: Megan Murray is a very pleasant woman with a history of obesity, diabetes mellitus, type II and difficult to control hyperlipidemia. She also has borderline hypertension. Essentially meeting criteria for Metabolic Syndrome.  She has extensive family history of diabetes and notes that her brother is also having some issues with his lipid management.  Apparently, she has tried different statins, but is not currently on 1. She seems to maybe had some myalgias although they're not listed. Most of what she had was GI issues of nausea and stomachache. She is not sure if she has tried fibrillates but thinks she has tried Educational psychologist. She is trying to get her glycemic control better monitored and managed with her medications and is now on 3 medications. She is aware of her weight and is hoping to adjust her diet with more diabetic appropriate diet. She has a hard time exercising because of arthritic pains in her knees and has been trying to do water exercises which the only issue is getting pool availability. 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  From a cardiac standpoint, besides having exertional dyspnea from deconditioning, she denies any actual cardiac symptoms of chest tightness pressure with rest or exertion. No heart failure symptoms of PND, orthopnea or edema. No rapid irregular heartbeats or palpitations. No syncope/near syncope or TIAs /amaurosis fugax symptoms. No claudication.  Recent Hospitalizations: n/a  Studies Reviewed: n/a  ROS: A comprehensive was  performed. Review of Systems  Constitutional: Negative for malaise/fatigue.  HENT: Negative for congestion.   Respiratory: Negative for cough and shortness of breath.        Unless her allergy symptoms act up  Gastrointestinal: Negative for blood in stool and melena.       Had GI upset with different statins.  Genitourinary: Negative for hematuria.  Musculoskeletal: Positive for back pain, joint pain and myalgias (Various fibromyalgia pain). Negative for falls.  Neurological: Negative for dizziness, focal weakness and weakness.  Endo/Heme/Allergies: Negative for environmental allergies.  Psychiatric/Behavioral: Negative for depression and memory loss. The patient is nervous/anxious. The patient does not have insomnia.    I have reviewed and (if needed) personal updated the patient's problem list, medications, allergies, past medical and surgical history, social and family history. (Since this was her first visit, the entire medical history, social and family history was all personally entered.)  Past Medical History:  Diagnosis Date  . Asthma   . Diabetes mellitus without complication (HCC)   . Dyslipidemia, goal to be determined    High triglycerides and LDL  . Fibromyalgia   . Obesity due to excess calories without serious comorbidity   . Osteoarthritis     Past Surgical History:  Procedure Laterality Date  . ABDOMINAL HYSTERECTOMY    . REDUCTION MAMMAPLASTY      Current Meds  Medication Sig  . ibuprofen (ADVIL,MOTRIN) 800 MG tablet Take 1 tablet (800 mg total) by mouth 3 (three) times daily.  Marland Kitchen JARDIANCE 10 MG TABS Take 10 mg by mouth daily.   . metFORMIN (GLUCOPHAGE) 500 MG tablet Take  500 mg by mouth at bedtime.   . methylPREDNISolone (MEDROL) 4 MG tablet Take 1 tablet (4 mg total) by mouth daily.  . traMADol (ULTRAM) 50 MG tablet Take 50 mg by mouth every 6 (six) hours as needed for moderate pain.  Marland Kitchen VICTOZA 18 MG/3ML SOPN Inject 1.8 mLs into the skin at bedtime.   Marland Kitchen  zolpidem (AMBIEN) 10 MG tablet Take 10 mg by mouth at bedtime as needed. For sleep    Allergies  Allergen Reactions  . Atorvastatin   . Cholestyramine     Gi upset  . Crestor [Rosuvastatin Calcium]   . Simvastatin   . Statins     GI upset; myalgias  . Codeine Palpitations    All codeine  . Meloxicam Rash    Social History   Social History  . Marital status: Married    Spouse name: N/A  . Number of children: 1  . Years of education: N/A   Social History Main Topics  . Smoking status: Never Smoker  . Smokeless tobacco: Never Used  . Alcohol use No  . Drug use: No  . Sexual activity: Yes   Other Topics Concern  . None   Social History Narrative  . None    family history includes COPD in her mother; Cancer in her paternal grandmother; Diabetes in her brother and maternal grandmother; Esophageal cancer in her father; Healthy in her sister; Heart failure in her mother; Liver disease in her paternal grandfather.  Wt Readings from Last 3 Encounters:  11/04/16 192 lb 9.6 oz (87.4 kg)  08/02/13 194 lb (88 kg)    PHYSICAL EXAM BP (!) 146/90   Pulse 90   Ht  (1.549 m)   Wt 192 lb 9.6 oz (87.4 kg)   BMI 36.39 kg/m  General appearance: alert, cooperative, appears stated age, no distress and Moderately obese. Well-nourished, well-groomed. Healthy-appearing HEENT: [place]/AT, EOMI, MMM, anicteric sclera Neck: no adenopathy, no carotid bruit and no JVD Lungs: clear to auscultation bilaterally, normal percussion bilaterally and non-labored Heart: regular rate and rhythm, S1 & S2 normal, no murmur, click, rub or gallop; nondisplaced PMI Abdomen: soft, non-tender; bowel sounds normal; no masses,  no organomegaly;  Extremities: extremities normal, atraumatic, no cyanosis, and edema trace Pulses: 2+ and symmetric;  Skin: mobility and turgor normal and no edema; no rash or lesions Neurologic: Mental status: Alert &  Oriented x 3, thought content appropriate; pleasant mood and  affect Cranial nerves: normal (II-XII grossly intact)    Adult ECG Report n/a   Other studies Reviewed: Additional studies/ records that were reviewed today include:  Recent Labs:  From her PCPs office notes the most recent labs I have are from 2016 and 2017. She had available to me her most recent lipid panel on her smart phone  TC to 75, TG 331, LDL 163, HDL 46 (non-HDL 230)  ASSESSMENT / PLAN: Problem List Items Addressed This Visit    Diabetes mellitus type II, non insulin dependent (HCC)   Elevated blood pressure reading in office without diagnosis of hypertension    She says that she does not have a diagnosis of hypertension, but in response to her blood pressure being high today, she still says that she was quite stressed out rushing to come every to this clinic. She had gone to the wrong [facility] office.  We can reassess her pressures when she comes back to [facility] for lipid management.      Hyperlipidemia with target LDL less  than 100 (Chronic)    With her comorbidities of obesity and levels a potential hypertension and diabetes, she is at risk for metabolic syndrome and therefore should have an LDL goal less than 100. She does not have documented atherosclerotic heart disease, however does have difficult to control lipids and significant risk factors. With elevated triglycerides, this goes along with her diabetes and think appropriate lifestyle modification with diet and exercise step 1. We talked about the importance of the DASH or Mediterranean diet are good options. Essentially reducing carbohydrates and animal fats. Type of the importance of making sure she gets something of exercise and actually doing water aerobics. Her biggest issue has been finding it warmer pool. I suggested the [facility] which has a warm pool: Located with her cold pool.  Plan for now will be to start fenofibrate at low-dose and have her follow-up with our Pharmacist [facility] or Risk  Reduction ([facility]) clinic. We will check another set lipid panel in roughly 3 months to see the effect of fenofibrate. Quite likely would need a higher dose. At [facility], they can review what medications she is actually tried in order to potentially make other recommendations. There are potential new options including a trial medication and even potentially PCSK9 inhibitors. Graciela Husbands in order to get approval for PCSK9 inhibitor we may need to do coronary calcium scoring or CTA down just to establish the presence of atherosclerotic cardiovascular disease.      Relevant Medications   fenofibrate 54 MG tablet   Other Relevant Orders   Lipid panel   Hepatic function panel   Hypertriglyceridemia    See detailed discussion and hyperlipidemia segment. Plan for now we did start fenofibrate 67 mg daily.  We'll also have her increase her crit well to 3000 mg daily Would also recommend starting to CoQ10 titrating up to 300 mg daily which could be 100 mg 3 times a day       Relevant Medications   fenofibrate 54 MG tablet   Other Relevant Orders   Lipid panel   Hepatic function panel      Current medicines are reviewed at length with the patient today. (+/- concerns) She rushed in to the office today having gone to the wrong office and therefore thinks her blood pressures higher than usual. The following changes have been made: See below  Patient Instructions  MEDICATION ADDITIONS  - INCREASE KRILL OIL 3,000 MG  THREE TIME A DAY  - CoQ10  300 mg - start with 100 mg daily for 2 weeks then increase by 100 mg every 2 weeks until you reach 300 mg daily  -Start fenofibrate 67 mg -take one tablet daily    We do labs in 3 months - will mail you labslip at that time Do not eat or drink the day of test until completed LIPID HEPATIC PANEL     Your physician recommends that you schedule a follow-up appointment in  [facility]- DISCUSS CHOLESTEROL 3 MONTHS     Studies Ordered:   Orders  Placed This Encounter  Procedures  . Lipid panel  . Hepatic function panel      Bryan Lemma, M.D., M.S. Interventional Cardiologist   Pager # [phone] Phone # [phone] [address] [place], [place] [address]

## 2016-11-04 NOTE — Patient Instructions (Addendum)
MEDICATION ADDITIONS  - INCREASE KRILL OIL 3,000 MG  THREE TIME A DAY  - CoQ10  300 mg - start with 100 mg daily for 2 weeks then increase by 100 mg every 2 weeks until you reach 300 mg daily  -Start fenofibrate 67 mg -take one tablet daily    We do labs in 3 months - will mail you labslip at that time Do not eat or drink the day of test until completed LIPID HEPATIC PANEL     Your physician recommends that you schedule a follow-up appointment in  [facility]- DISCUSS CHOLESTEROL 3 MONTHS

## 2016-11-09 ENCOUNTER — Encounter: Payer: Self-pay | Admitting: Cardiology

## 2016-11-09 DIAGNOSIS — Z794 Long term (current) use of insulin: Secondary | ICD-10-CM | POA: Insufficient documentation

## 2016-11-09 DIAGNOSIS — E119 Type 2 diabetes mellitus without complications: Secondary | ICD-10-CM | POA: Insufficient documentation

## 2016-11-09 DIAGNOSIS — I152 Hypertension secondary to endocrine disorders: Secondary | ICD-10-CM

## 2016-11-09 DIAGNOSIS — E1159 Type 2 diabetes mellitus with other circulatory complications: Secondary | ICD-10-CM

## 2016-11-09 DIAGNOSIS — I10 Essential (primary) hypertension: Secondary | ICD-10-CM | POA: Insufficient documentation

## 2016-11-09 HISTORY — DX: Hypertension secondary to endocrine disorders: I15.2

## 2016-11-09 HISTORY — DX: Hypertension secondary to endocrine disorders: E11.59

## 2016-11-09 HISTORY — DX: Type 2 diabetes mellitus without complications: E11.9

## 2016-11-09 HISTORY — DX: Essential (primary) hypertension: I10

## 2016-11-09 NOTE — Assessment & Plan Note (Addendum)
See detailed discussion and hyperlipidemia segment. Plan for now we did start fenofibrate 67 mg daily.  We'll also have her increase her crit well to 3000 mg daily Would also recommend starting to CoQ10 titrating up to 300 mg daily which could be 100 mg 3 times a day

## 2016-11-09 NOTE — Assessment & Plan Note (Signed)
With her comorbidities of obesity and levels a potential hypertension and diabetes, she is at risk for metabolic syndrome and therefore should have an LDL goal less than 100. She does not have documented atherosclerotic heart disease, however does have difficult to control lipids and significant risk factors. With elevated triglycerides, this goes along with her diabetes and think appropriate lifestyle modification with diet and exercise step 1. We talked about the importance of the DASH or Mediterranean diet are good options. Essentially reducing carbohydrates and animal fats. Type of the importance of making sure she gets something of exercise and actually doing water aerobics. Her biggest issue has been finding it warmer pool. I suggested the [facility] which has a warm pool: Located with her cold pool.  Plan for now will be to start fenofibrate at low-dose and have her follow-up with our [facility]. We will check another set lipid panel in roughly 3 months to see the effect of fenofibrate. Quite likely would need a higher dose. At [facility], they can review what medications she is actually tried in order to potentially make other recommendations. There are potential new options including a trial medication and even potentially PCSK9 inhibitors. Megan Murray in order to get approval for PCSK9 inhibitor we may need to do coronary calcium scoring or CTA down just to establish the presence of atherosclerotic cardiovascular disease.

## 2016-11-09 NOTE — Assessment & Plan Note (Signed)
She says that she does not have a diagnosis of hypertension, but in response to her blood pressure being high today, she still says that she was quite stressed out rushing to come every to this clinic. She had gone to the wrong [facility] office.  We can reassess her pressures when she comes back to [facility] for lipid management.

## 2016-11-12 ENCOUNTER — Telehealth: Payer: Self-pay | Admitting: Cardiology

## 2016-11-12 NOTE — Telephone Encounter (Signed)
Spoke with pt, she has stopped the fenofibrate and her symptoms went away immediately. Will forward to dr harding to see if he wants her to see the lipid clinic sooner than 3 months.

## 2016-11-12 NOTE — Telephone Encounter (Signed)
Pt c/o medication issue:  1. Name of Medication: fendfibrate 54mg  2. How are you currently taking this medication (dosage and times per day)? 1x day  3. Are you having a reaction (difficulty breathing--STAT)? no 4. What is your medication issue? Cramping and diarrhea

## 2016-11-13 NOTE — Telephone Encounter (Signed)
I find this hard to believe - sounds like a mental block. Unfortunately this is where we stand. She is post be seen in [facility] lipid clinic soon -- will let them try to treat her.  The best way for her to reduce her TG levels along with lipids is to exercise & reduce her dietary carbohydrate & fat intake.  Lower portion size & no grazing.    Bryan Lemmaavid Harding, MD

## 2016-11-15 ENCOUNTER — Other Ambulatory Visit: Payer: Self-pay | Admitting: *Deleted

## 2016-11-15 NOTE — Telephone Encounter (Signed)
Spoke to patient   instruction given - watch carb intake , exercise  Smaller portion.  will move appointment up sooner to discuss other options with [facility].  PATIENT verbalized understanding.

## 2016-11-19 ENCOUNTER — Ambulatory Visit: Payer: Medicare Other | Admitting: Cardiology

## 2016-11-24 ENCOUNTER — Encounter: Payer: Self-pay | Admitting: Pharmacist Clinician (PhC)/ Clinical Pharmacy Specialist

## 2016-11-24 ENCOUNTER — Ambulatory Visit (INDEPENDENT_AMBULATORY_CARE_PROVIDER_SITE_OTHER): Payer: Medicare Other | Admitting: Pharmacist Clinician (PhC)/ Clinical Pharmacy Specialist

## 2016-11-24 DIAGNOSIS — E785 Hyperlipidemia, unspecified: Secondary | ICD-10-CM | POA: Diagnosis not present

## 2016-11-24 NOTE — Patient Instructions (Signed)

## 2016-11-24 NOTE — Assessment & Plan Note (Signed)
Patient with elevated LDL and triglycerides, unable to tolerate everything except krill oil (only 1 capsule/day).  States GI issues too severe to take for more than 2 days after it was suggested that she take for 7-10 days to see if symptoms resolved.  After a [place] discussion, there is really nothing we can give her.  She has not tried ezetimibe, but with her history, I doubt she would take more than 1-2 tablets.  I did encourage her to try increasing the krill oil over the next month or two, up to 4 capsules per day.  I also encouraged her to eat oatmeal as a way to naturally lower cholesterol, but she states it raises her blood sugar because she cannot eat it without dried fruit in it.

## 2016-11-24 NOTE — Progress Notes (Signed)
11/24/2016 Megan Murray Jun 04, 1966 [id number]   HPI:  Megan Murray is a 51 y.o. female patient of Dr Megan Murray, who presents today for a lipid clinic evaluation.  Her medical history is significant for type 2 diabetes (diagnosed 7-8 years ago),  fibromyalgia and anklyosing spondylitis.  She appears to have an extreme sensitivity to cholesterol medications, and has been unable to tolerate anything for more than just 1-2 days.  Regardless of the medication or class, she states they cause severe GI upset and diarrhea.  Interestingly, she has does well on metformin and Victoza for her diabetes, both know for GI issues.  Patient reports last A1c to be 6.7 and most morning blood sugars run 130-140.   She has no cardiovascular risk factors at this time, despite the elevated LDL.    Current Medications:  none  Cholesterol Goals:   LDL <100  Intolerant/previously tried:  All statins were tried thru [facility] Dr. Merri Murray.  EPIC lists atorvastatin, simvastatin, and rosuvastatin as specific contraindications, patient describes GI upset and diarrhea after just 1-3 days on each (dates and strengths unknown)  Tried fenofibrate prescribed by Dr. Herbie Murray. Took x 2 days when same symptoms appeared.  Fish oil also caused similar symptoms, but is tolerating 1 krill oil capsule daily  Family history:   Mother died of HF/COPD at 19, had her first MI in her 45's  Father now 27 no health issues (prev esophageal cancer)  brother with diabetes  Diet:   Mostly home cooked meals; lot of chicken red meat, [place], not much fish; eggs most days of the week; fresh/frozen vegetables (never canned), doesn't eat much fruit due to DM Exercise:    probs with ankles, feet, knees and legs due to chronic pain; has rheumatologist; was told to do water aerobics, but pools too cold; was also told not to take walks because was damaging her her joints.    Labs:    Date ?  TC 275, TG 331, HDL 46, LDL 163  (non-HDL  230)  Current Outpatient Prescriptions  Medication Sig Dispense Refill  . Coenzyme Q10 100 MG TABS Take 100 mg by mouth daily.    Marland Kitchen KRILL OIL PO Take 150 mg by mouth daily.    . naproxen (NAPROSYN) 500 MG tablet Take 500 mg by mouth 2 (two) times daily at 8 am and 10 pm.    . TURMERIC CURCUMIN PO Take 1 tablet by mouth daily.    Marland Kitchen JARDIANCE 10 MG TABS Take 10 mg by mouth daily.     . metFORMIN (GLUCOPHAGE) 500 MG tablet Take 500 mg by mouth at bedtime.     . traMADol (ULTRAM) 50 MG tablet Take 50 mg by mouth every 6 (six) hours as needed for moderate pain.    Marland Kitchen VICTOZA 18 MG/3ML SOPN Inject 1.8 mLs into the skin at bedtime.     Marland Kitchen zolpidem (AMBIEN) 10 MG tablet Take 10 mg by mouth at bedtime as needed. For sleep     No current facility-administered medications for this visit.     Allergies  Allergen Reactions  . Atorvastatin   . Cholestyramine     Gi upset  . Crestor [Rosuvastatin Calcium]   . Fenofibrate Other (See Comments)    STOMACH CRAMPS,AND DIARRHEA  . Simvastatin   . Soy Allergy   . Statins     GI upset; myalgias  . Codeine Palpitations    All codeine  . Meloxicam Rash    Past Medical  History:  Diagnosis Date  . Asthma   . Diabetes mellitus without complication (HCC)   . Dyslipidemia, goal to be determined    High triglycerides and LDL  . Fibromyalgia   . Obesity due to excess calories without serious comorbidity   . Osteoarthritis     There were no vitals taken for this visit.   Hyperlipidemia with target LDL less than 100 Patient with elevated LDL and triglycerides, unable to tolerate everything except krill oil (only 1 capsule/day).  States GI issues too severe to take for more than 2 days after it was suggested that she take for 7-10 days to see if symptoms resolved.  After a [place] discussion, there is really nothing we can give her.  She has not tried ezetimibe, but with her history, I doubt she would take more than 1-2 tablets.  I did encourage her to try  increasing the krill oil over the next month or two, up to 4 capsules per day.  I also encouraged her to eat oatmeal as a way to naturally lower cholesterol, but she states it raises her blood sugar because she cannot eat it without dried fruit in it.     Megan HayKristin Murray PharmD CPP [facility]

## 2017-08-30 LAB — HM DIABETES EYE EXAM

## 2017-09-19 ENCOUNTER — Other Ambulatory Visit: Payer: Self-pay | Admitting: Family Medicine

## 2017-09-19 DIAGNOSIS — Z1231 Encounter for screening mammogram for malignant neoplasm of breast: Secondary | ICD-10-CM

## 2017-10-27 ENCOUNTER — Ambulatory Visit: Payer: Medicare Other

## 2017-11-28 ENCOUNTER — Ambulatory Visit: Payer: Medicare Other

## 2018-03-12 LAB — HEMOGLOBIN A1C: HEMOGLOBIN A1C: 7.1

## 2018-03-14 LAB — LIPID PANEL
Cholesterol: 151 (ref 0–200)
HDL: 47 (ref 35–70)
LDL Cholesterol: 104
Triglycerides: 273 — AB (ref 40–160)

## 2018-03-14 LAB — BASIC METABOLIC PANEL
BUN: 11 (ref 4–21)
Creatinine: 0.5 (ref 0.5–1.1)
GLUCOSE: 169
Potassium: 3.9 (ref 3.4–5.3)
Sodium: 142 (ref 137–147)

## 2018-03-14 LAB — VITAMIN D 25 HYDROXY (VIT D DEFICIENCY, FRACTURES): VIT D 25 HYDROXY: 73.3

## 2018-03-14 LAB — HEPATIC FUNCTION PANEL
ALT: 15 (ref 7–35)
AST: 11 — AB (ref 13–35)

## 2018-04-25 ENCOUNTER — Other Ambulatory Visit: Payer: Self-pay

## 2018-04-25 ENCOUNTER — Encounter: Payer: Self-pay | Admitting: Family Medicine

## 2018-04-25 ENCOUNTER — Ambulatory Visit (INDEPENDENT_AMBULATORY_CARE_PROVIDER_SITE_OTHER): Payer: Medicare Other | Admitting: Family Medicine

## 2018-04-25 ENCOUNTER — Ambulatory Visit (INDEPENDENT_AMBULATORY_CARE_PROVIDER_SITE_OTHER): Payer: Medicare Other

## 2018-04-25 VITALS — BP 124/82 | HR 84 | Temp 98.1°F | Ht 61.0 in | Wt 187.8 lb

## 2018-04-25 DIAGNOSIS — E119 Type 2 diabetes mellitus without complications: Secondary | ICD-10-CM | POA: Diagnosis not present

## 2018-04-25 DIAGNOSIS — M159 Polyosteoarthritis, unspecified: Secondary | ICD-10-CM

## 2018-04-25 DIAGNOSIS — M797 Fibromyalgia: Secondary | ICD-10-CM | POA: Insufficient documentation

## 2018-04-25 DIAGNOSIS — M79672 Pain in left foot: Secondary | ICD-10-CM

## 2018-04-25 DIAGNOSIS — E782 Mixed hyperlipidemia: Secondary | ICD-10-CM | POA: Diagnosis not present

## 2018-04-25 DIAGNOSIS — F339 Major depressive disorder, recurrent, unspecified: Secondary | ICD-10-CM | POA: Insufficient documentation

## 2018-04-25 DIAGNOSIS — M150 Primary generalized (osteo)arthritis: Secondary | ICD-10-CM | POA: Diagnosis not present

## 2018-04-25 HISTORY — DX: Major depressive disorder, recurrent, unspecified: F33.9

## 2018-04-25 HISTORY — DX: Polyosteoarthritis, unspecified: M15.9

## 2018-04-25 MED ORDER — EMPAGLIFLOZIN-METFORMIN HCL ER 25-1000 MG PO TB24: 1.0000 | ORAL_TABLET | Freq: Every day | ORAL | 11 refills | Status: DC

## 2018-04-25 MED ORDER — DULAGLUTIDE 1.5 MG/0.5ML ~~LOC~~ SOAJ: 1.5000 mg | SUBCUTANEOUS | 11 refills | Status: DC

## 2018-04-25 NOTE — Patient Instructions (Addendum)
Please return in 2 months for diabetes follow up.  Also schedule a cpe as well, December or after. Please request records from Dr. Katrinka Blazing.   It was a pleasure meeting you today! Thank you for choosing [place] to meet your healthcare needs! I truly look forward to working with you. If you have any questions or concerns, please send me a message via Mychart or call the office at [phone].  Please go to our [facility] office to get your xrays done. You can walk in M-F between 8am and 5pm. Tell them you are there for xrays ordered by me. They will send me the results, then I will let you know the results with instructions.   Address: [address] [place], [place], [place] [phone]  (office sits at [address] at [address] intersection; from here, turn left onto [place] [address]), take to [address], turn right and go for a mile or so, office will be on left across form [employer] [address] )  We are adjusting your diabetes medications. Please let me know if you have trouble getting them or with them.    Diabetes Mellitus and Nutrition When you have diabetes (diabetes mellitus), it is very important to have healthy eating habits because your blood sugar (glucose) levels are greatly affected by what you eat and drink. Eating healthy foods in the appropriate amounts, at about the same times every day, can help you:  Control your blood glucose.  Lower your risk of heart disease.  Improve your blood pressure.  Reach or maintain a healthy weight.  Every person with diabetes is different, and each person has different needs for a meal plan. Your health care provider may recommend that you work with a diet and nutrition specialist (dietitian) to make a meal plan that is best for you. Your meal plan may vary depending on factors such as:  The calories you need.  The medicines you take.  Your weight.  Your blood glucose, blood pressure, and cholesterol  levels.  Your activity level.  Other health conditions you have, such as heart or kidney disease.  How do carbohydrates affect me? Carbohydrates affect your blood glucose level more than any other type of food. Eating carbohydrates naturally increases the amount of glucose in your blood. Carbohydrate counting is a method for keeping track of how many carbohydrates you eat. Counting carbohydrates is important to keep your blood glucose at a healthy level, especially if you use insulin or take certain oral diabetes medicines. It is important to know how many carbohydrates you can safely have in each meal. This is different for every person. Your dietitian can help you calculate how many carbohydrates you should have at each meal and for snack. Foods that contain carbohydrates include:  Bread, cereal, rice, pasta, and crackers.  Potatoes and corn.  Peas, beans, and lentils.  Milk and yogurt.  Fruit and juice.  Desserts, such as cakes, cookies, ice cream, and candy.  How does alcohol affect me? Alcohol can cause a sudden decrease in blood glucose (hypoglycemia), especially if you use insulin or take certain oral diabetes medicines. Hypoglycemia can be a life-threatening condition. Symptoms of hypoglycemia (sleepiness, dizziness, and confusion) are similar to symptoms of having too much alcohol. If your health care provider says that alcohol is safe for you, follow these guidelines:  Limit alcohol intake to no more than 1 drink per day for nonpregnant women and 2 drinks per day for men. One drink  equals 12 oz of beer, 5 oz of wine, or 1 oz of hard liquor.  Do not drink on an empty stomach.  Keep yourself hydrated with water, diet soda, or unsweetened iced tea.  Keep in mind that regular soda, juice, and other mixers may contain a lot of sugar and must be counted as carbohydrates.  What are tips for following this plan? Reading food labels  Start by checking the serving size on the  label. The amount of calories, carbohydrates, fats, and other nutrients listed on the label are based on one serving of the food. Many foods contain more than one serving per package.  Check the total grams (g) of carbohydrates in one serving. You can calculate the number of servings of carbohydrates in one serving by dividing the total carbohydrates by 15. For example, if a food has 30 g of total carbohydrates, it would be equal to 2 servings of carbohydrates.  Check the number of grams (g) of saturated and trans fats in one serving. Choose foods that have low or no amount of these fats.  Check the number of milligrams (mg) of sodium in one serving. Most people should limit total sodium intake to less than 2,300 mg per day.  Always check the nutrition information of foods labeled as "low-fat" or "nonfat". These foods may be higher in added sugar or refined carbohydrates and should be avoided.  Talk to your dietitian to identify your daily goals for nutrients listed on the label. Shopping  Avoid buying canned, premade, or processed foods. These foods tend to be high in fat, sodium, and added sugar.  Shop around the outside edge of the grocery store. This includes fresh fruits and vegetables, bulk grains, fresh meats, and fresh dairy. Cooking  Use low-heat cooking methods, such as baking, instead of high-heat cooking methods like deep frying.  Cook using healthy oils, such as olive, canola, or sunflower oil.  Avoid cooking with butter, cream, or high-fat meats. Meal planning  Eat meals and snacks regularly, preferably at the same times every day. Avoid going [place] periods of time without eating.  Eat foods high in fiber, such as fresh fruits, vegetables, beans, and whole grains. Talk to your dietitian about how many servings of carbohydrates you can eat at each meal.  Eat 4-6 ounces of lean protein each day, such as lean meat, chicken, fish, eggs, or tofu. 1 ounce is equal to 1 ounce of  meat, chicken, or fish, 1 egg, or 1/4 cup of tofu.  Eat some foods each day that contain healthy fats, such as avocado, nuts, seeds, and fish. Lifestyle   Check your blood glucose regularly.  Exercise at least 30 minutes 5 or more days each week, or as told by your health care provider.  Take medicines as told by your health care provider.  Do not use any products that contain nicotine or tobacco, such as cigarettes and e-cigarettes. If you need help quitting, ask your health care provider.  Work with a Veterinary surgeon or diabetes educator to identify strategies to manage stress and any emotional and social challenges. What are some questions to ask my health care provider?  Do I need to meet with a diabetes educator?  Do I need to meet with a dietitian?  What number can I call if I have questions?  When are the best times to check my blood glucose? Where to find more information:  American Diabetes Association: diabetes.org/food-and-fitness/food  Academy of Nutrition and Dietetics: https://www.vargas.com/  [employer]  Institute of Diabetes and Digestive and Kidney Diseases (NIH): FindJewelers.cz Summary  A healthy meal plan will help you control your blood glucose and maintain a healthy lifestyle.  Working with a diet and nutrition specialist (dietitian) can help you make a meal plan that is best for you.  Keep in mind that carbohydrates and alcohol have immediate effects on your blood glucose levels. It is important to count carbohydrates and to use alcohol carefully. This information is not intended to replace advice given to you by your health care provider. Make sure you discuss any questions you have with your health care provider. Document Released: 03/25/2005 Document Revised: 08/02/2016 Document Reviewed: 08/02/2016 Elsevier Interactive Patient Education  [employer].

## 2018-04-25 NOTE — Progress Notes (Signed)
Subjective  CC:  Chief Complaint  Patient presents with  . Establish Care    Transfer Care from [facility], [place]  . Foot Pain    pain in the Left side of food, stabbing pain     HPI: Megan Murray is a 52 y.o. female who presents to [facility] today to establish care with me as a new patient.   She has the following concerns or needs:  52 year old diabetic patient with multiple chronic problems presents to establish care.  Had been with her primary care doctor for the last 12 years, however she was unable to see her due to how busy she got.  She is anxious about the change.  I reviewed records in care everywhere but do not have her most recent records.  Diabetes follow up: Her diabetic control is reported as Worse.  She reports her diabetes has been well controlled however most recently it was 7.1 and she imparts this change to starting Repatha.  She denies diabetic complications.  She is on multiple medications.  She is overweight. She denies exertional CP or SOB or symptomatic hypoglycemia. She denies foot sores or paresthesias.  She takes metformin 500 mg once nightly.  She takes Victoza nightly injections.  She is on low-dose Jardiance.  These medications have been stable and unchanged for several years.  She thinks she has had the Pneumovax but is not sure.  Will need to check old records  She has hypertension and hyperlipidemia.  She reports these of been well controlled.  She denies chest pain or CAD or CHF  She struggles with depression and anxiety which is multifactorial.  She has chronic pain and fibromyalgia.  Recently, her brother has been very sick and this is worrying her.  She is tearful during the interview.  She uses tramadol and Naprosyn for her pain.  Health maintenance: Due for colorectal cancer screening, mammogram, complete physical.  Flu shot up-to-date  Her main concern is intermittent sharp left stabbing foot pain.  Has been  ongoing for the last 1 or 2 months.  Unrelated to walking or sitting.  Pain is fleeting.  On the lateral midfoot.  She denies trauma.  She does have a diagnosis of osteoarthritis.  No heel pain.  No toe pain.  No redness, warmth or swelling. Immunization History  Administered Date(s) Administered  . Influenza-Unspecified 03/12/2018  . Tdap 07/13/2015    Diabetes Related Lab Review: Lab Results  Component Value Date   HGBA1C 7.1 03/12/2018   No results found for: MICROALBUR, MALB24HUR No results found for: CREATININE, BUN, NA, K, CL, CO2 No results found for: CHOL No results found for: HDL No results found for: LDLCALC No results found for: TRIG No results found for: CHOLHDL No results found for: LDLDIRECT The ASCVD Risk score Denman George DC Jr., et al., 2013) failed to calculate for the following reasons:   Cannot find a previous HDL lab   Cannot find a previous total cholesterol lab  Assessment  1. Type 2 diabetes mellitus without complication, without [place]ert-term current use of insulin (HCC)   2. Fibromyalgia   3. Mixed hyperlipidemia   4. Primary osteoarthritis involving multiple joints   5. Left foot pain      Plan   Type 2 diabetes: Marginally controlled.  Educated on goals.  Increase Jardiance to 25 and see if she can tolerate metformin at 1000.  Combination pill ordered.  Change to Trulicity once weekly injections.  Follow-up 2 months.  Work on diet.  Needs eye exam.  Fibromyalgia stable by her report  Mixed hyperlipidemia and hypertension currently well controlled.  Will check old records for recent labs  Foot pain: Check x-ray.  Firm soled shoes.  Monitor.  To podiatry if worsens.  Follow up:  Return in about 8 weeks (around 06/20/2018) for follow up of diabetes and hypertension. Orders Placed This Encounter  Procedures  . DG Foot Complete Left  . HM PAP SMEAR  . Hemoglobin A1c  . HM COLONOSCOPY   Meds ordered this encounter  Medications  . Empagliflozin-metFORMIN  HCl ER 25-1000 MG TB24    Sig: Take 1 tablet by mouth daily.    Dispense:  30 tablet    Refill:  11  . Dulaglutide (TRULICITY) 1.5 MG/0.5ML SOPN    Sig: Inject 1.5 mg into the skin once a week.    Dispense:  4 pen    Refill:  11     Depression screen PHQ 2/9 04/25/2018  Decreased Interest 1  Down, Depressed, Hopeless 1  PHQ - 2 Score 2  Altered sleeping 2  Tired, decreased energy 3  Change in appetite 1  Feeling bad or failure about yourself  2  Trouble concentrating 0  Moving slowly or fidgety/restless 0  Suicidal thoughts 0  PHQ-9 Score 10  Difficult doing work/chores Somewhat difficult   GAD 7 : Generalized Anxiety Score 04/25/2018  Nervous, Anxious, on Edge 1  Control/stop worrying 1  Worry too much - different things 1  Trouble relaxing 2  Restless 0  Easily annoyed or irritable 2  Afraid - awful might happen 0  Total GAD 7 Score 7  Anxiety Difficulty Somewhat difficult     We updated and reviewed the patient's past history in detail and it is documented below.  Patient Active Problem List   Diagnosis Date Noted  . Fibromyalgia 04/25/2018  . Major depression, recurrent, chronic (HCC) 04/25/2018    Dr. Evelene Croon   . Osteoarthritis, multiple sites 04/25/2018  . Essential hypertension 11/09/2016  . Type 2 diabetes mellitus without complications (HCC) 11/09/2016  . Hypertriglyceridemia 11/04/2016  . Mixed hyperlipidemia 11/04/2016  . B12 deficiency 01/23/2015   Health Maintenance  Topic Date Due  . PNEUMOCOCCAL POLYSACCHARIDE VACCINE AGE 76-64 HIGH RISK  05/16/1967  . FOOT EXAM  05/16/1975  . HIV Screening  05/15/1980  . COLONOSCOPY  05/16/2015  . MAMMOGRAM  10/25/2017  . HEMOGLOBIN A1C  09/10/2018  . OPHTHALMOLOGY EXAM  10/11/2018  . TETANUS/TDAP  07/12/2025  . INFLUENZA VACCINE  Completed   Immunization History  Administered Date(s) Administered  . Influenza-Unspecified 03/12/2018  . Tdap 07/13/2015   Current Meds  Medication Sig  . cyclobenzaprine  (FLEXERIL) 10 MG tablet Take by mouth.  Marland Kitchen glucose blood (ONE TOUCH ULTRA TEST) test strip TEST BID  . naproxen (NAPROSYN) 500 MG tablet Take 500 mg by mouth 2 (two) times daily at 8 am and 10 pm.  . traMADol (ULTRAM) 50 MG tablet Take 50 mg by mouth every 6 (six) hours as needed for moderate pain.  Marland Kitchen zolpidem (AMBIEN) 10 MG tablet Take 10 mg by mouth at bedtime as needed. For sleep  . [DISCONTINUED] JARDIANCE 10 MG TABS Take 10 mg by mouth daily.   . [DISCONTINUED] metFORMIN (GLUCOPHAGE) 500 MG tablet Take 500 mg by mouth at bedtime.   . [DISCONTINUED] TURMERIC CURCUMIN PO Take 1 tablet by mouth daily.  . [DISCONTINUED] VICTOZA 18 MG/3ML SOPN Inject 1.8 mLs into  the skin at bedtime.     Allergies: Patient is allergic to atorvastatin; cholestyramine; crestor [rosuvastatin calcium]; fenofibrate; simvastatin; soy allergy; statins; codeine; and meloxicam. Past Medical History Patient  has a past medical history of Asthma, Diabetes mellitus without complication (HCC), Dyslipidemia, goal to be determined, Fibromyalgia, Hyperlipidemia, Hypertension, Major depression, recurrent, chronic (HCC) (04/25/2018), Obesity due to excess calories without serious comorbidity, and Osteoarthritis. Past Surgical History Patient  has a past surgical history that includes Abdominal hysterectomy and Reduction mammaplasty. Family History: Patient family history includes Arthritis in her maternal grandmother; COPD in her mother; Cancer in her paternal grandmother; Depression in her brother; Diabetes in her brother, maternal grandfather, and maternal grandmother; Drug abuse in her father; Esophageal cancer in her father; Healthy in her sister; Hearing loss in her paternal grandmother; Heart attack in her brother; Heart disease in her brother; Heart failure in her mother; Liver disease in her paternal grandfather. Social History:  Patient  reports that she has never smoked. She has never used smokeless tobacco. She reports  that she does not drink alcohol or use drugs.  Review of Systems: Constitutional: negative for fever or malaise Ophthalmic: negative for photophobia, double vision or loss of vision Cardiovascular: negative for chest pain, dyspnea on exertion, or new LE swelling Respiratory: negative for SOB or persistent cough Gastrointestinal: negative for abdominal pain, change in bowel habits or melena Genitourinary: negative for dysuria or gross hematuria Musculoskeletal: negative for new gait disturbance or muscular weakness Integumentary: negative for new or persistent rashes Neurological: negative for TIA or stroke symptoms Psychiatric: negative for SI or delusions Allergic/Immunologic: negative for hives  Patient Care Team    Relationship Specialty Notifications Start End  Willow Ora, MD PCP - General Family Medicine  04/25/18     Objective  Vitals: BP 124/82   Pulse 84   Temp 98.1 F (36.7 C)   Ht 5\' 1"  (1.549 m)   Wt 187 lb 12.8 oz (85.2 kg)   SpO2 94%   BMI 35.48 kg/m  General:  Well developed, well nourished, no acute distress  Psych:  Alert and oriented,normal mood and affect, but tearful HEENT:  Normocephalic, atraumatic, non-icteric sclera, PERRL, oropharynx is without mass or exudate, supple neck without adenopathy, mass or thyromegaly Cardiovascular:  RRR without gallop, rub or murmur, nondisplaced PMI Respiratory:  Good breath sounds bilaterally, CTAB with normal respiratory effort Gastrointestinal: normal bowel sounds, soft, non-tender, no noted masses. No HSM MSK: no deformities, contusions. Joints are without erythema or swelling, left base of metatarsal, fifth is tender without warmth redness or swelling.  Negative squeeze test.  Negative heel tenderness. Skin:  Warm, no rashes or suspicious lesions noted Neurologic:    Mental status is normal. Gross motor and sensory exams are normal. Normal gait Diabetic Foot Exam: Appearance - no lesions, ulcers or calluses Skin  - no sigificant pallor or erythema Monofilament testing - sensitive bilaterally in following locations:  Right - Great toe, medial, central, lateral ball and posterior foot intact  Left - Great toe, medial, central, lateral ball and posterior foot intact Pulses - +2 distally bilaterally    Commons side effects, risks, benefits, and alternatives for medications and treatment plan prescribed today were discussed, and the patient expressed understanding of the given instructions. Patient is instructed to call or message via MyChart if he/she has any questions or concerns regarding our treatment plan. No barriers to understanding were identified. We discussed Red Flag symptoms and signs in detail. Patient expressed understanding regarding what  to do in case of urgent or emergency type symptoms.   Medication list was reconciled, printed and provided to the patient in AVS. Patient instructions and summary information was reviewed with the patient as documented in the AVS. This note was prepared with assistance of Dragon voice recognition software. Occasional wrong-word or sound-a-like substitutions may have occurred due to the inherent limitations of voice recognition software

## 2018-04-26 ENCOUNTER — Encounter: Payer: Self-pay | Admitting: Family Medicine

## 2018-05-09 ENCOUNTER — Other Ambulatory Visit: Payer: Self-pay | Admitting: Family Medicine

## 2018-05-09 NOTE — Telephone Encounter (Signed)
Requested medication (s) are due for refill today: yes  Requested medication (s) are on the active medication list: yes  Last refill:  By another provider  Future visit scheduled: yes, 06/21/18  Notes to clinic:  Medication previously filled by a different provider; LOV: 04/25/18    Requested Prescriptions  Pending Prescriptions Disp Refills   traMADol (ULTRAM) 50 MG tablet 30 tablet     Sig: Take 1 tablet (50 mg total) by mouth every 6 (six) hours as needed for moderate pain.     Not Delegated - Analgesics:  Opioid Agonists Failed - 05/09/2018 11:28 AM      Failed - This refill cannot be delegated      Failed - Urine Drug Screen completed in last 360 days.      Passed - Valid encounter within last 6 months    Recent Outpatient Visits          2 weeks ago Type 2 diabetes mellitus without complication, without [place]y-term current use of insulin [facility])   [facility] Ji[place], Malachi Bonds, MD      Future Appointments            In 1 month Willow Ora, MD [facility], [place]   In 2 months Willow Ora, MD [facility], [place]

## 2018-05-09 NOTE — Telephone Encounter (Signed)
Copied from CRM [id number]. Topic: Quick Communication - Rx Refill/Question >> May 09, 2018 11:24 AM Gerrianne Scale wrote: Medication: traMADol (ULTRAM) 50 MG tablet  Has the patient contacted their pharmacy? No. Pt is new to Dr Mardelle Matte (Agent: If no, request that the patient contact the pharmacy for the refill.) (Agent: If yes, when and what did the pharmacy advise?)  Preferred Pharmacy (with phone number or street name): [id number] - [place], [place] - [address] [phone] (Phone) [phone] (Fax)    Agent: Please be advised that RX refills may take up to 3 business days. We ask that you follow-up with your pharmacy.

## 2018-05-11 ENCOUNTER — Encounter: Payer: Self-pay | Admitting: Emergency Medicine

## 2018-05-16 ENCOUNTER — Encounter: Payer: Self-pay | Admitting: Family Medicine

## 2018-05-16 DIAGNOSIS — K760 Fatty (change of) liver, not elsewhere classified: Secondary | ICD-10-CM

## 2018-05-16 HISTORY — DX: Fatty (change of) liver, not elsewhere classified: K76.0

## 2018-05-16 MED ORDER — TRAMADOL HCL 50 MG PO TABS: 50.0000 mg | ORAL_TABLET | Freq: Four times a day (QID) | ORAL | 0 refills | Status: DC | PRN

## 2018-05-18 ENCOUNTER — Ambulatory Visit: Payer: Self-pay

## 2018-05-18 NOTE — Telephone Encounter (Signed)
Pt c/o productive cough x 3 days. The cough is continuous and makes it hard for pt to sleep. Pt stated that her chest feels "heavy." Pt denies shortness of breath, wheezing, fever. Pt denies any radiating pain to the neck, jaw, arm, shoulder or back. Pt is having post nasal drip and chest congestion. Pt stated the coughing and drainage is worse at night. Pt is coughing up clear thick phlegm. Pt stated that she has tried drinking extra fluids and warm fluids at night. She has been sucking on sugar free cough drops and taking Dimetapp.  Care advice given to pt and pt verbalized understanding. Appointment made for pt tomorrow at 9:30 with PCP.  Reason for Disposition . [1] Continuous (nonstop) coughing interferes with work or school AND [2] no improvement using cough treatment per Care Advice  Answer Assessment - Initial Assessment Questions 1. ONSET: "When did the cough begin?"      3 days ago 2. SEVERITY: "How bad is the cough today?"      Frequent cough and at night keeping pt awake 3. RESPIRATORY DISTRESS: "Describe your breathing."      starts to cough and has a "coughing spasm" 4. FEVER: "Do you have a fever?" If so, ask: "What is your temperature, how was it measured, and when did it start?"     no 5. SPUTUM: "Describe the color of your sputum" (clear, white, yellow, green)     Clear- thick 6. HEMOPTYSIS: "Are you coughing up any blood?" If so ask: "How much?" (flecks, streaks, tablespoons, etc.)     no 7. CARDIAC HISTORY: "Do you have any history of heart disease?" (e.g., heart attack, congestive heart failure)      no 8. LUNG HISTORY: "Do you have any history of lung disease?"  (e.g., pulmonary embolus, asthma, emphysema)     2004 chronic bronchitis (the winter of 2004) 9. PE RISK FACTORS: "Do you have a history of blood clots?" (or: recent major surgery, recent prolonged travel, bedridden)     no 10. OTHER SYMPTOMS: "Do you have any other symptoms?" (e.g., runny nose, wheezing, chest  pain)       Runny nose, chest heavyness, chest congestion 11. PREGNANCY: "Is there any chance you are pregnant?" "When was your last menstrual period?"       n/a 12. TRAVEL: "Have you traveled out of the country in the last month?" (e.g., travel history, exposures)       n/a  Protocols used: COUGH - ACUTE PRODUCTIVE-A-AH

## 2018-05-19 ENCOUNTER — Encounter: Payer: Self-pay | Admitting: Family Medicine

## 2018-05-19 ENCOUNTER — Ambulatory Visit (INDEPENDENT_AMBULATORY_CARE_PROVIDER_SITE_OTHER): Payer: Medicare Other | Admitting: Family Medicine

## 2018-05-19 ENCOUNTER — Other Ambulatory Visit: Payer: Self-pay

## 2018-05-19 VITALS — BP 124/84 | HR 85 | Temp 98.3°F | Ht 61.0 in | Wt 189.6 lb

## 2018-05-19 DIAGNOSIS — J301 Allergic rhinitis due to pollen: Secondary | ICD-10-CM

## 2018-05-19 DIAGNOSIS — J4521 Mild intermittent asthma with (acute) exacerbation: Secondary | ICD-10-CM

## 2018-05-19 DIAGNOSIS — R05 Cough: Secondary | ICD-10-CM

## 2018-05-19 DIAGNOSIS — J42 Unspecified chronic bronchitis: Secondary | ICD-10-CM | POA: Diagnosis not present

## 2018-05-19 DIAGNOSIS — B9689 Other specified bacterial agents as the cause of diseases classified elsewhere: Secondary | ICD-10-CM

## 2018-05-19 DIAGNOSIS — R059 Cough, unspecified: Secondary | ICD-10-CM

## 2018-05-19 HISTORY — DX: Allergic rhinitis due to pollen: J30.1

## 2018-05-19 MED ORDER — PREDNISONE 10 MG PO TABS: ORAL_TABLET | ORAL | 0 refills | Status: DC

## 2018-05-19 MED ORDER — FLUCONAZOLE 150 MG PO TABS: ORAL_TABLET | ORAL | 0 refills | Status: DC

## 2018-05-19 MED ORDER — AZITHROMYCIN 250 MG PO TABS: ORAL_TABLET | ORAL | 0 refills | Status: DC

## 2018-05-19 MED ORDER — BENZONATATE 100 MG PO CAPS: 100.0000 mg | ORAL_CAPSULE | Freq: Two times a day (BID) | ORAL | 0 refills | Status: DC | PRN

## 2018-05-19 NOTE — Progress Notes (Signed)
Subjective  CC:  Chief Complaint  Patient presents with  . Cough    productive, with clear draingage, tightness in her chest x 1 week     HPI: SUBJECTIVE:  Megan Murray is a 52 y.o. female who complains of congestion, nasal blockage, post nasal drip, cough described as barky, dry, harsh, painful and paroxysmal and denies sinus, high fevers. Symptoms have been present for 5-6 weeks! And started with URI sxs. 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 feels chest tightness and chest soreness from coughing. Using robitussin with some relief. Holding ace inhibitor (started about 6 weeks ago). Patient does not smoke cigarettes.  Assessment  1. Cough   2. Protracted bacterial bronchitis (HCC)      Plan  Discussion:  Treat for bacterial bronchitis due to prolonged course and worsening symptoms. ? Atypical pertussis etc ... Treat cough for bronchospasm with pred. Treat allergies.  Education regarding differences between viral and bacterial infections and treatment options are discussed.  Supportive care measures are recommended.  We discussed the use of mucolytic's, decongestants, antihistamines and antitussives as needed.  Tylenol or Advil are recommended if needed.  Follow up: prn   No orders of the defined types were placed in this encounter.  Meds ordered this encounter  Medications  . predniSONE (DELTASONE) 10 MG tablet    Sig: Take 4 tabs qd x 2 days, 3 qd x 2 days, 2 qd x 2d, 1qd x 3 days    Dispense:  21 tablet    Refill:  0  . azithromycin (ZITHROMAX) 250 MG tablet    Sig: Take 2 tabs today, then 1 tab daily for 4 days    Dispense:  1 each    Refill:  0  . benzonatate (TESSALON) 100 MG capsule    Sig: Take 1 capsule (100 mg total) by mouth 2 (two) times daily as needed for cough.    Dispense:  20 capsule    Refill:  0      I reviewed  the patients updated PMH, FH, and SocHx.  Social History: Patient  reports that she has never smoked. She has never used smokeless tobacco. She reports that she does not drink alcohol or use drugs.  Patient Active Problem List   Diagnosis Date Noted  . Fatty liver 05/16/2018  . Fibromyalgia 04/25/2018  . Major depression, recurrent, chronic (HCC) 04/25/2018  . Osteoarthritis, multiple sites 04/25/2018  . Essential hypertension 11/09/2016  . Type 2 diabetes mellitus without complications (HCC) 11/09/2016  . Hypertriglyceridemia 11/04/2016  . Mixed hyperlipidemia 11/04/2016  . B12 deficiency 01/23/2015    Review of Systems: Cardiovascular: negative for chest pain Respiratory: negative for SOB or hemoptysis Gastrointestinal: negative for abdominal pain Genitourinary: negative for dysuria or gross hematuria Current Meds  Medication Sig  . BD PEN NEEDLE NANO U/F 32G X 4 MM MISC U UTD FOR INJECTION OF VICTOZA  . Dulaglutide (TRULICITY) 1.5 MG/0.5ML SOPN Inject 1.5 mg into the skin once a week.  . Empagliflozin-metFORMIN HCl ER 25-1000 MG TB24 Take 1 tablet by mouth daily.  Marland Kitchen glucose blood (ONE TOUCH ULTRA TEST) test strip TEST BID  . naproxen (NAPROSYN) 500 MG tablet Take 500 mg by mouth 2 (two) times daily at 8 am and 10 pm.  . ONE TOUCH ULTRA TEST test strip 2 (two) times daily. for testing  .  REPATHA SURECLICK 140 MG/ML SOAJ ADM 1 ML [place] Q 2 WKS  . traMADol (ULTRAM) 50 MG tablet Take 1 tablet (50 mg total) by mouth every 6 (six) hours as needed for moderate pain.  Marland Kitchen zolpidem (AMBIEN) 10 MG tablet Take 10 mg by mouth at bedtime as needed. For sleep  . [DISCONTINUED] acetaminophen (TYLENOL) 500 MG tablet Take by mouth.  . [DISCONTINUED] cyclobenzaprine (FLEXERIL) 10 MG tablet Take by mouth.    Objective  Vitals: BP 124/84   Pulse 85   Temp 98.3 F (36.8 C)   Ht 5\' 1"  (1.549 m)   Wt 189 lb 9.6 oz (86 kg)   SpO2 98%   BMI 35.82 kg/m  General: no acute distress but hacking cough  present Psych:  Alert and oriented, normal mood and affect HEENT:  Normocephalic, atraumatic, supple neck, moist mucous membranes, mildly erythematous pharynx without exudate, mild lymphadenopathy, supple neck Cardiovascular:  RRR without murmur. no edema Respiratory:  Good breath sounds bilaterally, CTAB with normal respiratory effort with occasional rhonchi, + ant chest wall tpp Skin:  Warm, no rashes Neurologic:   Mental status is normal. normal gait  Commons side effects, risks, benefits, and alternatives for medications and treatment plan prescribed today were discussed, and the patient expressed understanding of the given instructions. Patient is instructed to call or message via MyChart if he/she has any questions or concerns regarding our treatment plan. No barriers to understanding were identified. We discussed Red Flag symptoms and signs in detail. Patient expressed understanding regarding what to do in case of urgent or emergency type symptoms.  Medication list was reconciled, printed and provided to the patient in AVS. Patient instructions and summary information was reviewed with the patient as documented in the AVS. This note was prepared with assistance of Dragon voice recognition software. Occasional wrong-word or sound-a-like substitutions may have occurred due to the inherent limitations of voice recognition software

## 2018-05-19 NOTE — Patient Instructions (Signed)
Please follow up if symptoms do not improve or as needed.   Start the new medications and add zyrtec nightly.   You can also try flonase.   Come back if things do not get better.   Continue to hold lisinopril for now.

## 2018-06-14 ENCOUNTER — Other Ambulatory Visit: Payer: Self-pay | Admitting: Family Medicine

## 2018-06-15 MED ORDER — TRAMADOL HCL 50 MG PO TABS: 50.0000 mg | ORAL_TABLET | Freq: Four times a day (QID) | ORAL | 0 refills | Status: DC | PRN

## 2018-06-15 NOTE — Telephone Encounter (Signed)
Last OV: 04/25/2018 Last Fill: 05/16/2018 #30 with 0 RF

## 2018-06-15 NOTE — Telephone Encounter (Signed)
Needs UDS and pain contract at next visit. This was med from prior PCP.

## 2018-06-21 ENCOUNTER — Encounter: Payer: Self-pay | Admitting: Family Medicine

## 2018-06-21 ENCOUNTER — Other Ambulatory Visit: Payer: Self-pay

## 2018-06-21 ENCOUNTER — Ambulatory Visit (INDEPENDENT_AMBULATORY_CARE_PROVIDER_SITE_OTHER): Payer: Medicare Other | Admitting: Family Medicine

## 2018-06-21 VITALS — BP 126/86 | HR 75 | Temp 98.8°F | Resp 14 | Ht 61.0 in | Wt 186.0 lb

## 2018-06-21 DIAGNOSIS — E119 Type 2 diabetes mellitus without complications: Secondary | ICD-10-CM | POA: Diagnosis not present

## 2018-06-21 DIAGNOSIS — E782 Mixed hyperlipidemia: Secondary | ICD-10-CM | POA: Diagnosis not present

## 2018-06-21 DIAGNOSIS — M797 Fibromyalgia: Secondary | ICD-10-CM

## 2018-06-21 DIAGNOSIS — E781 Pure hyperglyceridemia: Secondary | ICD-10-CM | POA: Diagnosis not present

## 2018-06-21 DIAGNOSIS — Z789 Other specified health status: Secondary | ICD-10-CM

## 2018-06-21 HISTORY — DX: Other specified health status: Z78.9

## 2018-06-21 LAB — POCT GLYCOSYLATED HEMOGLOBIN (HGB A1C): Hemoglobin A1C: 6.9 % — AB (ref 4.0–5.6)

## 2018-06-21 LAB — MICROALBUMIN / CREATININE URINE RATIO
Creatinine,U: 50.8 mg/dL
MICROALB UR: 1 mg/dL (ref 0.0–1.9)
Microalb Creat Ratio: 1.9 mg/g (ref 0.0–30.0)

## 2018-06-21 NOTE — Patient Instructions (Signed)
Please return in January as scheduled. Come fasting  Keep the same medicines. Its ok to stop the repatha.   Work on diet as much as you can. Eat veggies and fruit for snacks if possible (avoiding the processed snacks).  If you have any questions or concerns, please don't hesitate to send me a message via MyChart or call the office at [phone]. Thank you for visiting with us today! It's our pleasure caring for you.

## 2018-06-21 NOTE — Progress Notes (Signed)
Subjective  CC:  Chief Complaint  Patient presents with  . Diabetes    HPI: Megan Murray is a 52 y.o. female who presents to the office today for follow up of diabetes and problems listed above in the chief complaint.   Diabetes follow up: Her diabetic control is reported as Improved. She feels strongly repatha elevates her blood sugars and would like to stop it.  She denies exertional CP or SOB or symptomatic hypoglycemia. She denies foot sores or paresthesias. Diet has been difficult due to caring for her ill brother who was recently hospitalized. No new complaints. Her fasting sugars are down to 130s consistently. We increased jardiance and changed to trulicity at last visit: she is tolerating changes well.   Mixed hyperlidemia: reviewed old chart; has been to cards for lipid clinic.  I reviewed that note.  Has failed all statins.  Has multiple medication intolerances.  She had been started on Repatha but no longer wants to use that due to perceived hyperglycemia.  Cardiology did not want to start any other medications.  She is never been on Zetia but he did not think it would be likely that she tolerated it.  She does have hypertriglyceridemia.  She is never been on a new triglyceride lowering agent.    Assessment  1. Type 2 diabetes mellitus without complication, without Soderquist-term current use of insulin (HCC)   2. Fibromyalgia   3. Familial hypertriglyceridemia   4. Statin intolerance   5. Mixed hyperlipidemia    Plan   Diabetes is currently improving and fairly well controlled.  Continue current medications and recheck at her physical next month.  Check urine testing.    Marland Kitchen. Hypertriglyceridemia: Diabetic without heart disease.  In the future consider starting new shellfish based therapy.  Fibromyalgia and stress management: Counseling done.  Chronic pain medications, urine drug screen and contract for tramadol done today.  Follow up: Return in about 4 weeks (around 07/19/2018)  for complete physical.. Orders Placed This Encounter  Procedures  . Microalbumin / creatinine urine ratio  . Pain Mgmt, Profile 8 w/Conf, U  . POCT HgB A1C   No orders of the defined types were placed in this encounter.     Immunization History  Administered Date(s) Administered  . Influenza-Unspecified 03/12/2018  . Pneumococcal Polysaccharide-23 01/21/2015  . Tdap 03/03/2012, 07/13/2015    Diabetes Related Lab Review: Lab Results  Component Value Date   HGBA1C 6.9 (A) 06/21/2018   HGBA1C 7.1 03/12/2018    Lab Results  Component Value Date   HGBA1C 6.9 (A) 06/21/2018   HGBA1C 7.1 03/12/2018    No results found for: Concepcion ElkMICROALBUR, [id number] Lab Results  Component Value Date   CREATININE 0.5 03/14/2018   BUN 11 03/14/2018   NA 142 03/14/2018   K 3.9 03/14/2018   Lab Results  Component Value Date   CHOL 151 03/14/2018   Lab Results  Component Value Date   HDL 47 03/14/2018   Lab Results  Component Value Date   LDLCALC 104 03/14/2018   Lab Results  Component Value Date   TRIG 273 (A) 03/14/2018   No results found for: CHOLHDL No results found for: LDLDIRECT The 10-year ASCVD risk score Denman George(Goff DC Jr., et al., 2013) is: 2.6%*   Values used to calculate the score:     Age: [age] years     Sex: Female     Is Non-Hispanic African American: No     Diabetic: Yes  Tobacco smoker: No     Systolic Blood Pressure: 126 mmHg     Is BP treated: No     HDL Cholesterol: 47 mg/dL*     Total Cholesterol: 151 mg/dL*     * - Cholesterol units were assumed for this score calculation I have reviewed the PMH, Fam and Soc history. Patient Active Problem List   Diagnosis Date Noted  . Statin intolerance 06/21/2018    Priority: High  . Fibromyalgia 04/25/2018    Priority: High    On chronic tramadol   . Major depression, recurrent, chronic (HCC) 04/25/2018    Priority: High    Dr. Evelene Croon   . Essential hypertension 11/09/2016    Priority: High  . Type 2 diabetes mellitus  without complications (HCC) 11/09/2016    Priority: High  . 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    Priority: High  . Mixed hyperlipidemia 11/04/2016    Priority: High    Failed statins; hyperglycemia with repatha. Stopped 06/2018    . Fatty liver 05/16/2018    Priority: Medium    By CT scan   . Osteoarthritis, multiple sites 04/25/2018    Priority: Medium  . Seasonal allergic rhinitis due to pollen 05/19/2018    Priority: Low  . B12 deficiency 01/23/2015    Priority: Low    Social History: Patient  reports that she has never smoked. She has never used smokeless tobacco. She reports that she does not drink alcohol or use drugs.  Review of Systems: Ophthalmic: negative for eye pain, loss of vision or double vision Cardiovascular: negative for chest pain Respiratory: negative for SOB or persistent cough Gastrointestinal: negative for abdominal pain Genitourinary: negative for dysuria or gross hematuria MSK: negative for foot lesions Neurologic: negative for weakness or gait disturbance  Objective  Vitals: BP 126/86   Pulse 75   Temp 98.8 F (37.1 C) (Oral)   Resp 14   Ht 5\' 1"  (1.549 m)   Wt 186 lb (84.4 kg)   SpO2 99%   BMI 35.14 kg/m  General: well appearing, no acute distress  Psych:  Alert and oriented, normal mood and affect HEENT:  Normocephalic, atraumatic, moist mucous membranes, supple neck  Cardiovascular:  Nl S1 and S2, RRR without murmur, gallop or rub. no edema Respiratory:  Good breath sounds bilaterally, CTAB with normal effort, no rales Gastrointestinal: normal BS, soft, nontender Skin:  Warm, no rashes Neurologic:   Mental status is normal. normal gait Foot exam: no erythema, pallor, or cyanosis visible nl proprioception and sensation to monofilament testing bilaterally, +2 distal pulses bilaterally    Diabetic education: ongoing education regarding chronic disease management for diabetes was given today. We continue to reinforce the ABC's of  diabetic management: A1c (<7 or 8 dependent upon patient), tight blood pressure control, and cholesterol management with goal LDL < 100 minimally. We discuss diet strategies, exercise recommendations, medication options and possible side effects. At each visit, we review recommended immunizations and preventive care recommendations for diabetics and stress that good diabetic control can prevent other problems. See below for this patient's data.    Commons side effects, risks, benefits, and alternatives for medications and treatment plan prescribed today were discussed, and the patient expressed understanding of the given instructions. Patient is instructed to call or message via MyChart if he/she has any questions or concerns regarding our treatment plan. No barriers to understanding were identified. We discussed Red Flag symptoms and signs in detail. Patient expressed understanding regarding what to do in case  of urgent or emergency type symptoms.   Medication list was reconciled, printed and provided to the patient in AVS. Patient instructions and summary information was reviewed with the patient as documented in the AVS. This note was prepared with assistance of Dragon voice recognition software. Occasional wrong-word or sound-a-like substitutions may have occurred due to the inherent limitations of voice recognition software

## 2018-06-22 ENCOUNTER — Encounter: Payer: Self-pay | Admitting: Family Medicine

## 2018-06-22 LAB — PAIN MGMT, PROFILE 8 W/CONF, U
6 ACETYLMORPHINE: NEGATIVE ng/mL (ref <10)
ALCOHOL METABOLITES: NEGATIVE ng/mL (ref <500)
AMPHETAMINES: NEGATIVE ng/mL (ref <500)
Benzodiazepines: NEGATIVE ng/mL (ref <100)
Buprenorphine, Urine: NEGATIVE ng/mL (ref <5)
CREATININE: 51.7 mg/dL
Cocaine Metabolite: NEGATIVE ng/mL (ref <150)
MDMA: NEGATIVE ng/mL (ref <500)
Marijuana Metabolite: NEGATIVE ng/mL (ref <20)
OPIATES: NEGATIVE ng/mL (ref <100)
Oxidant: NEGATIVE ug/mL (ref <200)
Oxycodone: NEGATIVE ng/mL (ref <100)
pH: 5.51 (ref 4.5–9.0)

## 2018-07-07 ENCOUNTER — Other Ambulatory Visit: Payer: Self-pay | Admitting: Family Medicine

## 2018-07-07 NOTE — Telephone Encounter (Signed)
Reviewed chart. UDS and tramadol contract signed 06/2018. Refilled x 6 months.

## 2018-07-07 NOTE — Telephone Encounter (Signed)
Last OV: 06/21/18 Last Fill: 06/15/18 #30 no refills

## 2018-07-24 ENCOUNTER — Encounter: Payer: Self-pay | Admitting: Family Medicine

## 2018-07-24 ENCOUNTER — Other Ambulatory Visit: Payer: Self-pay

## 2018-07-24 ENCOUNTER — Ambulatory Visit (INDEPENDENT_AMBULATORY_CARE_PROVIDER_SITE_OTHER): Payer: Medicare Other | Admitting: Family Medicine

## 2018-07-24 VITALS — BP 130/86 | HR 76 | Temp 98.1°F | Resp 16 | Ht 62.0 in | Wt 188.2 lb

## 2018-07-24 DIAGNOSIS — E782 Mixed hyperlipidemia: Secondary | ICD-10-CM | POA: Diagnosis not present

## 2018-07-24 DIAGNOSIS — I10 Essential (primary) hypertension: Secondary | ICD-10-CM

## 2018-07-24 DIAGNOSIS — E781 Pure hyperglyceridemia: Secondary | ICD-10-CM | POA: Diagnosis not present

## 2018-07-24 DIAGNOSIS — M25531 Pain in right wrist: Secondary | ICD-10-CM

## 2018-07-24 DIAGNOSIS — Z1239 Encounter for other screening for malignant neoplasm of breast: Secondary | ICD-10-CM | POA: Diagnosis not present

## 2018-07-24 DIAGNOSIS — Z0000 Encounter for general adult medical examination without abnormal findings: Secondary | ICD-10-CM | POA: Diagnosis not present

## 2018-07-24 DIAGNOSIS — M797 Fibromyalgia: Secondary | ICD-10-CM

## 2018-07-24 DIAGNOSIS — Z789 Other specified health status: Secondary | ICD-10-CM

## 2018-07-24 DIAGNOSIS — E119 Type 2 diabetes mellitus without complications: Secondary | ICD-10-CM | POA: Diagnosis not present

## 2018-07-24 LAB — CBC WITH DIFFERENTIAL/PLATELET
BASOS PCT: 0.6 % (ref 0.0–3.0)
Basophils Absolute: 0.1 10*3/uL (ref 0.0–0.1)
Eosinophils Absolute: 0.2 10*3/uL (ref 0.0–0.7)
Eosinophils Relative: 2.4 % (ref 0.0–5.0)
HCT: 49.4 % — ABNORMAL HIGH (ref 36.0–46.0)
Hemoglobin: 16.6 g/dL — ABNORMAL HIGH (ref 12.0–15.0)
LYMPHS ABS: 2.7 10*3/uL (ref 0.7–4.0)
Lymphocytes Relative: 33.3 % (ref 12.0–46.0)
MCHC: 33.5 g/dL (ref 30.0–36.0)
MCV: 90.4 fl (ref 78.0–100.0)
MONO ABS: 0.4 10*3/uL (ref 0.1–1.0)
Monocytes Relative: 5.4 % (ref 3.0–12.0)
NEUTROS ABS: 4.8 10*3/uL (ref 1.4–7.7)
NEUTROS PCT: 58.3 % (ref 43.0–77.0)
PLATELETS: 238 10*3/uL (ref 150.0–400.0)
RBC: 5.46 Mil/uL — ABNORMAL HIGH (ref 3.87–5.11)
RDW: 13.5 % (ref 11.5–15.5)
WBC: 8.2 10*3/uL (ref 4.0–10.5)

## 2018-07-24 LAB — COMPREHENSIVE METABOLIC PANEL
ALT: 22 U/L (ref 0–35)
AST: 16 U/L (ref 0–37)
Albumin: 4.5 g/dL (ref 3.5–5.2)
Alkaline Phosphatase: 88 U/L (ref 39–117)
BUN: 11 mg/dL (ref 6–23)
CHLORIDE: 104 meq/L (ref 96–112)
CO2: 21 mEq/L (ref 19–32)
Calcium: 9.5 mg/dL (ref 8.4–10.5)
Creatinine, Ser: 0.47 mg/dL (ref 0.40–1.20)
GFR: 147.22 mL/min (ref >60.00)
GLUCOSE: 140 mg/dL — AB (ref 70–99)
Potassium: 4 mEq/L (ref 3.5–5.1)
SODIUM: 137 meq/L (ref 135–145)
Total Bilirubin: 0.6 mg/dL (ref 0.2–1.2)
Total Protein: 7 g/dL (ref 6.0–8.3)

## 2018-07-24 LAB — LIPID PANEL
CHOL/HDL RATIO: 6
Cholesterol: 248 mg/dL — ABNORMAL HIGH (ref 0–200)
HDL: 40.5 mg/dL (ref >39.00)
NonHDL: 207.45
Triglycerides: 362 mg/dL — ABNORMAL HIGH (ref 0.0–149.0)
VLDL: 72.4 mg/dL — AB (ref 0.0–40.0)

## 2018-07-24 LAB — POCT GLYCOSYLATED HEMOGLOBIN (HGB A1C): HEMOGLOBIN A1C: 7.1 % — AB (ref 4.0–5.6)

## 2018-07-24 LAB — TSH: TSH: 2.42 u[IU]/mL (ref 0.35–4.50)

## 2018-07-24 LAB — LDL CHOLESTEROL, DIRECT: Direct LDL: 139 mg/dL

## 2018-07-24 MED ORDER — ICOSAPENT ETHYL 0.5 G PO CAPS: 0.5000 mg | ORAL_CAPSULE | Freq: Every day | ORAL | 11 refills | Status: DC

## 2018-07-24 MED ORDER — SITAGLIPTIN PHOSPHATE 50 MG PO TABS: 50.0000 mg | ORAL_TABLET | Freq: Every day | ORAL | 3 refills | Status: DC

## 2018-07-24 MED ORDER — PREDNISONE 10 MG PO TABS: ORAL_TABLET | ORAL | 0 refills | Status: DC

## 2018-07-24 NOTE — Patient Instructions (Signed)
Please return in 3 months for diabetes and cholesterol follow up  We are starting 2 new medications today. One for diabetes and one for your cholesterol levels. Please keep taking all of your other medications.   I have ordered prednisone to take for one week for your wrist pain. Please follow up with Dr. Nickola Major as well. Hold your naprosyn while you are on the prednisone and note it will increase your sugars so avoid sugars and drink plenty of water.   We will call you with information regarding your referral appointment. Mammogram.  If you do not hear from [place] within the next 2 weeks, please let me know. It can take 1-2 weeks to get appointments set up with the specialists.   If you have any questions or concerns, please don't hesitate to send me a message via MyChart or call the office at [phone]. Thank you for visiting with [place] today! It's our pleasure caring for you.  Please do these things to maintain good health!   Exercise at least 30-45 minutes a day,  4-5 days a week.   Eat a low-fat diet with lots of fruits and vegetables, up to 7-9 servings per day.  Drink plenty of water daily. Try to drink 8 8oz glasses per day.  Seatbelts can save your life. Always wear your seatbelt.  Place Smoke Detectors on every level of your home and check batteries every year.  Schedule an appointment with an eye doctor for an eye exam every 1-2 years  Safe sex - use condoms to protect yourself from STDs if you could be exposed to these types of infections. Use birth control if you do not want to become pregnant and are sexually active.  Avoid heavy alcohol use. If you drink, keep it to less than 2 drinks/day and not every day.  Health Care Power of Attorney.  Choose someone you trust that could speak for you if you became unable to speak for yourself.  Depression is common in our stressful world.If you're feeling down or losing interest in things you normally enjoy, please come in for a  visit.  If anyone is threatening or hurting you, please get help. Physical or Emotional Violence is never OK.

## 2018-07-24 NOTE — Progress Notes (Signed)
Subjective  Chief Complaint  Patient presents with  . Annual Exam  . Diabetes  . Hyperlipidemia  . Hypertension    HPI: Megan Murray is a 53 y.o. female who presents to [facility] [place] today for a Female Wellness Visit. She also has the concerns and/or needs as listed above in the chief complaint. These will be addressed in addition to the Health Maintenance Visit.   Wellness Visit: annual visit with health maintenance review and exam without Pap   Health maintenance: Overdue for mammogram.  Status post hysterectomy.  Immunizations are up-to-date.  Obesity: Weight is stable.  Continues to struggle with diet and exercise mainly due to constant stress related to family problems, ailing brother, and currently undergoing a fibromyalgia flare.  Has had 2 to 4 weeks of increased pain.  Using tramadol with fair control.  She does have a rheumatologist but has not seen her in over a year.  Chronic disease f/u and/or acute problem visit: (deemed necessary to be done in addition to the wellness visit):  Diabetes follow up: Her diabetic control is reported as Unchanged.  Due for recheck after increasing her medications by adding Trulicity and SGLT2 inhibitor.  She is taking her medications without adverse effects.  Diet is unchanged. She denies exertional CP or SOB or symptomatic hypoglycemia. She denies foot sores or paresthesias.  Eye exam is up-to-date, no retinopathy  Hypertension is well controlled.Feeling well. Taking medications w/o adverse effects. No symptoms of CHF, angina; no palpitations, sob, cp or lower extremity edema. Compliant with meds.   Hyperlipidemia/hypertriglyceridemia, intolerant to most cholesterol-lowering medications: Fasting today for recheck.  Discussed option of starting Vascepa.  She complains of 2-week history of right wrist pain associated with some swelling and warmth.  No injury.  She denies history of gout.  She reports she does have  osteoarthritis.  Has used brace and NSAIDs with mild relief.  No radiation of pain or paresthesias.  Fibromyalgia is active, mainly induced and triggered by stress.  Using tramadol nightly.  She has failed other medications like Cymbalta. Immunization History  Administered Date(s) Administered  . Influenza-Unspecified 03/12/2018  . Pneumococcal Polysaccharide-23 01/21/2015  . Tdap 03/03/2012, 07/13/2015    Diabetes Related Lab Review: Lab Results  Component Value Date   HGBA1C 7.1 (A) 07/24/2018   HGBA1C 6.9 (A) 06/21/2018   HGBA1C 7.1 03/12/2018     Lab Results  Component Value Date   MICROALBUR 1.0 06/21/2018   Lab Results  Component Value Date   CREATININE 0.5 03/14/2018   BUN 11 03/14/2018   NA 142 03/14/2018   K 3.9 03/14/2018   Lab Results  Component Value Date   CHOL 151 03/14/2018   Lab Results  Component Value Date   HDL 47 03/14/2018   Lab Results  Component Value Date   LDLCALC 104 03/14/2018   Lab Results  Component Value Date   TRIG 273 (A) 03/14/2018   No results found for: CHOLHDL No results found for: LDLDIRECT The 10-year ASCVD risk score Denman George(Goff DC Jr., et al., 2013) is: 2.8%*   Values used to calculate the score:     Age: [age] years     Sex: Female     Is Non-Hispanic African American: No     Diabetic: Yes     Tobacco smoker: No     Systolic Blood Pressure: 130 mmHg     Is BP treated: No     HDL Cholesterol: 47 mg/dL*  Total Cholesterol: 151 mg/dL*     * - Cholesterol units were assumed for this score calculation   Assessment  1. Annual physical exam   2. Type 2 diabetes mellitus without complication, without Spiller-term current use of insulin (HCC)   3. Familial hypertriglyceridemia   4. Essential hypertension   5. Fibromyalgia   6. Mixed hyperlipidemia   7. Statin intolerance   8. Breast cancer screening   9. Arthralgia of right wrist      Plan  Female Wellness Visit:  Age appropriate Health Maintenance and Prevention  measures were discussed with patient. Included topics are cancer screening recommendations, ways to keep healthy (see AVS) including dietary and exercise recommendations, regular eye and dental care, use of seat belts, and avoidance of moderate alcohol use and tobacco use.  Mammogram ordered and encouraged patient to get it done.  BMI: discussed patient's BMI and encouraged positive lifestyle modifications to help get to or maintain a target BMI.  HM needs and immunizations were addressed and ordered. See below for orders. See HM and immunization section for updates.  Routine labs and screening tests ordered including cmp, cbc and lipids where appropriate.  Discussed recommendations regarding Vit D and calcium supplementation (see AVS)  Chronic disease management visit and/or acute problem visit:  Hypertension is well controlled  Diabetes: Control is fairly well controlled.  Would like A1c to be lower.  Education given.  She struggles with diet and stress.  Add Januvia.  Risk and benefits discussed.  Recheck 3 months.  Hyperlipidemia and hypertriglyceridemia: Start Vascepa.  Recheck 3 months   Arthralgia of right wrist: Question osteoarthritis flare versus gout.  Prednisone x7 days.  Risk and benefits discussed.  Follow-up with rheumatology recommended  Depression, chronic: Managed by psychiatry.  Well-controlled.  Discussed stress management.  Follow up: Return in about 3 months (around 10/23/2018) for follow up Diabetes, follow up hypercholesterolemia.  Orders Placed This Encounter  Procedures  . MM DIGITAL SCREENING BILATERAL  . CBC with Differential/Platelet  . Comprehensive metabolic panel  . Lipid panel  . HIV Antibody (routine testing w rflx)  . TSH  . POCT glycosylated hemoglobin (Hb A1C)   Meds ordered this encounter  Medications  . Icosapent Ethyl (VASCEPA) 0.5 g CAPS    Sig: Take 0.5 mg by mouth at bedtime.    Dispense:  30 capsule    Refill:  11  . sitaGLIPtin  (JANUVIA) 50 MG tablet    Sig: Take 1 tablet (50 mg total) by mouth daily.    Dispense:  90 tablet    Refill:  3  . predniSONE (DELTASONE) 10 MG tablet    Sig: Take 4 tabs qd x 2 days, 3 qd x 2 days, 2 qd x 2d, 1qd x 3 days    Dispense:  21 tablet    Refill:  0      Lifestyle: Body mass index is 34.42 kg/m. Wt Readings from Last 3 Encounters:  07/24/18 188 lb 3.2 oz (85.4 kg)  06/21/18 186 lb (84.4 kg)  05/19/18 189 lb 9.6 oz (86 kg)   Diet: low fat Exercise: rarely,   Patient Active Problem List   Diagnosis Date Noted  . Statin intolerance 06/21/2018    Priority: High  . Fibromyalgia 04/25/2018    Priority: High    On chronic tramadol   . Major depression, recurrent, chronic (HCC) 04/25/2018    Priority: High    Dr. Evelene Croon   . Essential hypertension 11/09/2016    Priority:  High  . Type 2 diabetes mellitus without complications (HCC) 11/09/2016    Priority: High  . 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    Priority: High  . Mixed hyperlipidemia 11/04/2016    Priority: High    Failed statins; hyperglycemia with repatha. Stopped 06/2018    . Fatty liver 05/16/2018    Priority: Medium    By CT scan   . Osteoarthritis, multiple sites 04/25/2018    Priority: Medium  . Seasonal allergic rhinitis due to pollen 05/19/2018    Priority: Low  . B12 deficiency 01/23/2015    Priority: Low   Health Maintenance  Topic Date Due  . HIV Screening  05/15/1980  . MAMMOGRAM  12/11/2018 (Originally 10/25/2017)  . OPHTHALMOLOGY EXAM  10/11/2018  . HEMOGLOBIN A1C  12/21/2018  . FOOT EXAM  04/26/2019  . URINE MICROALBUMIN  06/22/2019  . TETANUS/TDAP  07/12/2025  . COLONOSCOPY  01/09/2028  . INFLUENZA VACCINE  Completed  . PNEUMOCOCCAL POLYSACCHARIDE VACCINE AGE 23-64 HIGH RISK  Completed   Immunization History  Administered Date(s) Administered  . Influenza-Unspecified 03/12/2018  . Pneumococcal Polysaccharide-23 01/21/2015  . Tdap 03/03/2012, 07/13/2015   We updated  and reviewed the patient's past history in detail and it is documented below. Allergies: Patient is allergic to atorvastatin; cholestyramine; crestor [rosuvastatin calcium]; fenofibrate; simvastatin; soy allergy; statins; codeine; and meloxicam. Past Medical History Patient  has a past medical history of Asthma, Diabetes mellitus without complication (HCC), Dyslipidemia, goal to be determined, Fatty liver (05/16/2018), Fibromyalgia, Hyperlipidemia, Hypertension, Major depression, recurrent, chronic (HCC) (04/25/2018), Obesity due to excess calories without serious comorbidity, and Osteoarthritis. Past Surgical History Patient  has a past surgical history that includes Abdominal hysterectomy and Reduction mammaplasty. Family History: Patient family history includes Arthritis in her maternal grandmother; COPD in her mother; Cancer in her paternal grandmother; Depression in her brother; Diabetes in her brother, maternal grandfather, and maternal grandmother; Drug abuse in her father; Esophageal cancer in her father; Healthy in her sister; Hearing loss in her paternal grandmother; Heart attack in her brother; Heart disease in her brother; Heart failure in her mother; Liver disease in her paternal grandfather. Social History:  Patient  reports that she has never smoked. She has never used smokeless tobacco. She reports that she does not drink alcohol or use drugs.  Review of Systems: Constitutional: negative for fever or malaise Ophthalmic: negative for photophobia, double vision or loss of vision Cardiovascular: negative for chest pain, dyspnea on exertion, or new LE swelling Respiratory: negative for SOB or persistent cough Gastrointestinal: negative for abdominal pain, change in bowel habits or melena Genitourinary: negative for dysuria or gross hematuria, no abnormal uterine bleeding or disharge Musculoskeletal: negative for new gait disturbance or muscular weakness Integumentary: negative for new  or persistent rashes, no breast lumps Neurological: negative for TIA or stroke symptoms Psychiatric: negative for SI or delusions Allergic/Immunologic: negative for hives  Patient Care Team    Relationship Specialty Notifications Start End  Willow Ora, MD PCP - General Family Medicine  04/25/18   Milagros Evener, MD Consulting Physician Psychiatry  06/21/18   Marykay Lex, MD Consulting Physician Cardiology  06/21/18   Zenovia Jordan, MD Consulting Physician Rheumatology  07/24/18     Objective  Vitals: BP 130/86   Pulse 76   Temp 98.1 F (36.7 C) (Oral)   Resp 16   Ht 5\' 2"  (1.575 m)   Wt 188 lb 3.2 oz (85.4 kg)   SpO2 98%   BMI 34.42 kg/m  General:  Well developed, well nourished, no acute distress  Psych:  Alert and orientedx3,normal mood and affect HEENT:  Normocephalic, atraumatic, non-icteric sclera, PERRL, oropharynx is clear without mass or exudate, supple neck without adenopathy, mass or thyromegaly Cardiovascular:  Normal S1, S2, RRR without gallop, rub or murmur, nondisplaced PMI Respiratory:  Good breath sounds bilaterally, CTAB with normal respiratory effort Gastrointestinal: normal bowel sounds, soft, non-tender, no noted masses. No HSM MSK: no deformities, contusions. Joints are without erythema or swelling. Spine and CVA region are nontender Skin:  Warm, no rashes or suspicious lesions noted Neurologic:    Mental status is normal. CN 2-11 are normal. Gross motor and sensory exams are normal. Normal gait. No tremor Breast Exam: Status post breast reduction, well-healed scars.  No mass, skin retraction or nipple discharge is appreciated in either breast. No axillary adenopathy. Fibrocystic changes are not noted    Commons side effects, risks, benefits, and alternatives for medications and treatment plan prescribed today were discussed, and the patient expressed understanding of the given instructions. Patient is instructed to call or message via MyChart if  he/she has any questions or concerns regarding our treatment plan. No barriers to understanding were identified. We discussed Red Flag symptoms and signs in detail. Patient expressed understanding regarding what to do in case of urgent or emergency type symptoms.   Medication list was reconciled, printed and provided to the patient in AVS. Patient instructions and summary information was reviewed with the patient as documented in the AVS. This note was prepared with assistance of Dragon voice recognition software. Occasional wrong-word or sound-a-like substitutions may have occurred due to the inherent limitations of voice recognition software

## 2018-07-25 ENCOUNTER — Encounter: Payer: Self-pay | Admitting: Family Medicine

## 2018-07-25 LAB — HIV ANTIBODY (ROUTINE TESTING W REFLEX): HIV: NONREACTIVE

## 2018-07-31 ENCOUNTER — Other Ambulatory Visit: Payer: Self-pay | Admitting: Family Medicine

## 2018-07-31 DIAGNOSIS — Z1231 Encounter for screening mammogram for malignant neoplasm of breast: Secondary | ICD-10-CM

## 2018-08-04 DIAGNOSIS — M797 Fibromyalgia: Secondary | ICD-10-CM | POA: Diagnosis not present

## 2018-08-04 DIAGNOSIS — R5383 Other fatigue: Secondary | ICD-10-CM | POA: Diagnosis not present

## 2018-08-04 DIAGNOSIS — M150 Primary generalized (osteo)arthritis: Secondary | ICD-10-CM | POA: Diagnosis not present

## 2018-08-04 DIAGNOSIS — M2550 Pain in unspecified joint: Secondary | ICD-10-CM | POA: Diagnosis not present

## 2018-08-25 ENCOUNTER — Ambulatory Visit: Payer: Medicare Other

## 2018-10-13 ENCOUNTER — Ambulatory Visit: Payer: Medicare Other

## 2018-10-17 ENCOUNTER — Other Ambulatory Visit: Payer: Self-pay | Admitting: Family Medicine

## 2018-10-17 ENCOUNTER — Encounter: Payer: Self-pay | Admitting: Family Medicine

## 2018-10-17 MED ORDER — ZOLPIDEM TARTRATE 10 MG PO TABS: 10.0000 mg | ORAL_TABLET | Freq: Every evening | ORAL | 5 refills | Status: DC | PRN

## 2018-10-24 ENCOUNTER — Encounter: Payer: Self-pay | Admitting: Family Medicine

## 2018-10-24 ENCOUNTER — Other Ambulatory Visit: Payer: Self-pay

## 2018-10-24 ENCOUNTER — Ambulatory Visit (INDEPENDENT_AMBULATORY_CARE_PROVIDER_SITE_OTHER): Payer: Medicare Other | Admitting: Family Medicine

## 2018-10-24 VITALS — BP 124/88 | Temp 98.3°F | Resp 16 | Wt 183.4 lb

## 2018-10-24 DIAGNOSIS — E782 Mixed hyperlipidemia: Secondary | ICD-10-CM | POA: Diagnosis not present

## 2018-10-24 DIAGNOSIS — F339 Major depressive disorder, recurrent, unspecified: Secondary | ICD-10-CM

## 2018-10-24 DIAGNOSIS — E119 Type 2 diabetes mellitus without complications: Secondary | ICD-10-CM

## 2018-10-24 DIAGNOSIS — I10 Essential (primary) hypertension: Secondary | ICD-10-CM

## 2018-10-24 DIAGNOSIS — Z789 Other specified health status: Secondary | ICD-10-CM | POA: Diagnosis not present

## 2018-10-24 NOTE — Progress Notes (Signed)
I have discussed the procedure for the virtual visit with the patient who has given consent to proceed with assessment and treatment.   Tiara S Simmons, CMA     

## 2018-10-24 NOTE — Progress Notes (Signed)
Virtual Visit via Video Note  Subjective  CC:  Chief Complaint  Patient presents with  . Follow-up  . Diabetes    Average over past 14 days is 142 fasting  . Hypertension  . Hyperlipidemia    HPI:  I connected with Megan Murray on 10/24/18 at  9:00 AM EDT by a video enabled telemedicine application and verified that I am speaking with the correct person using two identifiers. Location patient: Home Location provider: [facility], Office Persons participating in the virtual visit: STEVETTE FOLLETTE, Willow Ora, MD Rita Ohara, CMA   I discussed the limitations of evaluation and management by telemedicine and the availability of in person appointments. The patient expressed understanding and agreed to proceed. . 53 y.o. female   Diabetes follow up: Her diabetic control is reported as Improved.  She was started on Januvia in addition to her other multiple medications at last visit.  She has been checking fasting sugars, ranging from 1 20-1 53 with an average of 142 over the last 2 weeks.  She has not been checking postprandials.  She denies symptoms of hyperglycemia.  No adverse effects from her medications.  She feels well. She denies exertional CP or SOB or symptomatic hypoglycemia. She denies foot sores or paresthesias.  Her eye exam is due but was postponed of the pandemic.  She is up-to-date on her immunizations  Hypertension control is good.  Hypertriglyceridemia and mixed lipidemia started on Vascepa at last visit due to statin intolerance.  No adverse effects.  She is eating at home all of her meals.  She has been able to lose 5 pounds.  Depression is doing much better since stay-at-home border.  She is less stressed because she has less to do.  She is coping better with stressors including brothers chronic illness and she is the main caretaker, as well her father was diagnosed with cirrhosis and suffering some of the complications.  She thought something  was wrong with his mental status at times, now feels validated.  Fibromyalgia pain continues to be present but not as debilitating. Immunization History  Administered Date(s) Administered  . Influenza-Unspecified 03/12/2018  . Pneumococcal Polysaccharide-23 01/21/2015  . Tdap 03/03/2012, 07/13/2015    Diabetes Related Lab Review: Lab Results  Component Value Date   HGBA1C 7.1 (A) 07/24/2018   Lab Results  Component Value Date   MICROALBUR 1.0 06/21/2018   Lab Results  Component Value Date   CREATININE 0.47 07/24/2018   BUN 11 07/24/2018   NA 137 07/24/2018   K 4.0 07/24/2018   CL 104 07/24/2018   CO2 21 07/24/2018   Lab Results  Component Value Date   CHOL 248 (H) 07/24/2018   CHOL 151 03/14/2018   Lab Results  Component Value Date   HDL 40.50 07/24/2018   HDL 47 03/14/2018   Lab Results  Component Value Date   LDLCALC 104 03/14/2018   Lab Results  Component Value Date   TRIG 362.0 (H) 07/24/2018   TRIG 273 (A) 03/14/2018   Lab Results  Component Value Date   CHOLHDL 6 07/24/2018   Lab Results  Component Value Date   LDLDIRECT 139.0 07/24/2018   The 10-year ASCVD risk score Denman George DC Jr., et al., 2013) is: 5.2%   Values used to calculate the score:     Age: 81 years     Sex: Female     Is Non-Hispanic African American: No     Diabetic:  Yes     Tobacco smoker: No     Systolic Blood Pressure: 124 mmHg     Is BP treated: No     HDL Cholesterol: 40.5 mg/dL     Total Cholesterol: 248 mg/dL Wt Readings from Last 3 Encounters:  10/24/18 183 lb 6.4 oz (83.2 kg)  07/24/18 188 lb 3.2 oz (85.4 kg)  06/21/18 186 lb (84.4 kg)    Assessment  1. Type 2 diabetes mellitus without complication, without Camire-term current use of insulin (HCC)   2. Essential hypertension   3. Statin intolerance   4. Mixed hyperlipidemia   5. Major depression, recurrent, chronic (HCC)      Plan   Diabetes type 2: Control is fair.  Sounds like it is improving.  Continue current  management strategies.  Recheck A1c in 3 months in the office.  No change in medications at this time.  She will get her eye visit when she can.  Continue diabetic diet and weight loss  Hypertension is well controlled  Hypertriglyceridemia: Continue medications and check fasting lipids at next visit.  If LDL remains elevated, consider PSK 9 drug therapy and/or Zetia  Depression and fibromyalgia are improving.  Counseling done I discussed the assessment and treatment plan with the patient. The patient was provided an opportunity to ask questions and all were answered. The patient agreed with the plan and demonstrated an understanding of the instructions.   The patient was advised to call back or seek an in-person evaluation if the symptoms worsen or if the condition fails to improve as anticipated. Follow up: Return in about 3 months (around 01/23/2019) for follow up of diabetes and hypertension, follow up hypercholesterolemia.  Visit date not found  No orders of the defined types were placed in this encounter.     I reviewed the patients updated PMH, FH, and SocHx.    Patient Active Problem List   Diagnosis Date Noted  . Statin intolerance 06/21/2018    Priority: High  . Fibromyalgia 04/25/2018    Priority: High  . Major depression, recurrent, chronic (HCC) 04/25/2018    Priority: High  . Essential hypertension 11/09/2016    Priority: High  . Type 2 diabetes mellitus without complications (HCC) 11/09/2016    Priority: High  . 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    Priority: High  . Mixed hyperlipidemia 11/04/2016    Priority: High  . Fatty liver 05/16/2018    Priority: Medium  . Osteoarthritis, multiple sites 04/25/2018    Priority: Medium  . Seasonal allergic rhinitis due to pollen 05/19/2018    Priority: Low  . B12 deficiency 01/23/2015    Priority: Low   Current Meds  Medication Sig  . diclofenac (VOLTAREN) 75 MG EC tablet TAKE 1 TABLET BY MOUTH TWICE DAILY  .  Dulaglutide (TRULICITY) 1.5 MG/0.5ML SOPN Inject 1.5 mg into the skin once a week.  . Empagliflozin-metFORMIN HCl ER 25-1000 MG TB24 Take 1 tablet by mouth daily.  Marland Kitchen glucose blood (ONE TOUCH ULTRA TEST) test strip TEST BID  . Icosapent Ethyl (VASCEPA) 0.5 g CAPS Take 0.5 mg by mouth at bedtime.  . naproxen (NAPROSYN) 500 MG tablet Take 500 mg by mouth 2 (two) times daily at 8 am and 10 pm.  . sitaGLIPtin (JANUVIA) 50 MG tablet Take 1 tablet (50 mg total) by mouth daily.  . traMADol (ULTRAM) 50 MG tablet TAKE 1 TABLET BY MOUTH EVERY 6 HOURS AS NEEDED FOR MODERATE PAIN  . zolpidem (AMBIEN) 10 MG tablet Take  1 tablet (10 mg total) by mouth at bedtime as needed. For sleep    Allergies: Patient is allergic to atorvastatin; cholestyramine; crestor [rosuvastatin calcium]; fenofibrate; simvastatin; soy allergy; statins; codeine; and meloxicam. Family History: Patient family history includes Arthritis in her maternal grandmother; COPD in her mother; Cancer in her paternal grandmother; Cirrhosis in her father; Depression in her brother; Diabetes in her brother, maternal grandfather, and maternal grandmother; Drug abuse in her father; Esophageal cancer in her father; Healthy in her sister; Hearing loss in her paternal grandmother; Heart attack in her brother; Heart disease in her brother; Heart failure in her mother; Liver disease in her paternal grandfather. Social History:  Patient  reports that she has never smoked. She has never used smokeless tobacco. She reports that she does not drink alcohol or use drugs.  Review of Systems: Constitutional: Negative for fever malaise or anorexia Cardiovascular: negative for chest pain Respiratory: negative for SOB or persistent cough Gastrointestinal: negative for abdominal pain  OBJECTIVE Vitals: BP 124/88   Temp 98.3 F (36.8 C) (Oral)   Resp 16   Wt 183 lb 6.4 oz (83.2 kg)   BMI 33.54 kg/m  General: no acute distress , A&Ox3 Psych: Appears happy and  calm, normal speech, normal cognition Willow Oraamille L Tramaine Sauls, MD

## 2018-10-24 NOTE — Patient Instructions (Addendum)
Please return in 3 months for diabetes follow up and fasting lipid follow up.  If you have any questions or concerns, please don't hesitate to send me a message via MyChart or call the office at [phone]. Thank you for visiting with [place] today! It's our pleasure caring for you.

## 2018-11-22 ENCOUNTER — Encounter: Payer: Self-pay | Admitting: Family Medicine

## 2018-11-23 ENCOUNTER — Encounter: Payer: Self-pay | Admitting: Family Medicine

## 2018-11-23 ENCOUNTER — Ambulatory Visit (INDEPENDENT_AMBULATORY_CARE_PROVIDER_SITE_OTHER): Payer: Medicare Other | Admitting: Family Medicine

## 2018-11-23 ENCOUNTER — Other Ambulatory Visit: Payer: Self-pay

## 2018-11-23 VITALS — Wt 181.4 lb

## 2018-11-23 DIAGNOSIS — B86 Scabies: Secondary | ICD-10-CM | POA: Diagnosis not present

## 2018-11-23 MED ORDER — PERMETHRIN 5 % EX CREA: 1.0000 "application " | TOPICAL_CREAM | Freq: Once | CUTANEOUS | 0 refills | Status: AC

## 2018-11-23 NOTE — Progress Notes (Signed)
Virtual Visit via Video Note  Subjective  CC:  Chief Complaint  Patient presents with  . Rash    She notice about 2 -3 weeks ago. It started on the back of one hand and it has now spread on to inside of both hands and down both arms. Denies changing any soaps, lotions, etc     I connected with Robby SermonLaurie K Mckiver on 11/23/18 at  8:40 AM EDT by a video enabled telemedicine application and verified that I am speaking with the correct person using two identifiers. Location patient: Home Location provider: [facility] [address] [address] Persons participating in the virtual visit: Megan SalesLaurie K Hiemstra, Willow Oraamille L Bradyn Vassey, MD Rita Oharaiara Simmons, CMA  I discussed the limitations of evaluation and management by telemedicine and the availability of in person appointments. The patient expressed understanding and agreed to proceed. HPI: Megan SalesLaurie K Murray is a 53 y.o. female who was contacted today to address the problems listed above in the chief complaint. . 53 year old female noticed the red itchy bumps on the back of her right hand about 2 weeks ago.since she has noticed several other red itching bumps on hands and left forearm. This week she noted a similar bump on her left cheek. She denies groin, axillary or torso involvement. No f/c/s or associated sxs. She has been mainly home but has been to the hospital on several occassions: her father passed suddenly on April 22nd and her brother had a limb amputation. As well, she has been out in the yard and outside more. She has been very stressed. Denies scratching or picking at skin. No tick bites. No other affected family members in the home. No hives or blisters or pain   Assessment  1. Scabies      Plan   Possible scabies:  Educated on differential. Will empirically treat. See avs. F/u if not improving.  I discussed the assessment and treatment plan with the patient. The patient was provided an opportunity to ask questions and all were answered.  The patient agreed with the plan and demonstrated an understanding of the instructions.   The patient was advised to call back or seek an in-person evaluation if the symptoms worsen or if the condition fails to improve as anticipated. Follow up: Return for as scheduled.  01/23/2019  Meds ordered this encounter  Medications  . permethrin (ELIMITE) 5 % cream    Sig: Apply 1 application topically once for 1 dose. Cover skin and leave on overnight, then shower in the am    Dispense:  60 g    Refill:  0      I reviewed the patients updated PMH, FH, and SocHx.    Patient Active Problem List   Diagnosis Date Noted  . Statin intolerance 06/21/2018    Priority: High  . Fibromyalgia 04/25/2018    Priority: High  . Major depression, recurrent, chronic (HCC) 04/25/2018    Priority: High  . Essential hypertension 11/09/2016    Priority: High  . Type 2 diabetes mellitus without complications (HCC) 11/09/2016    Priority: High  . 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    Priority: High  . Mixed hyperlipidemia 11/04/2016    Priority: High  . Fatty liver 05/16/2018    Priority: Medium  . Osteoarthritis, multiple sites 04/25/2018    Priority: Medium  . Seasonal allergic rhinitis due to pollen 05/19/2018    Priority: Low  . B12 deficiency 01/23/2015    Priority: Low  Current Meds  Medication Sig  . diclofenac (VOLTAREN) 75 MG EC tablet TAKE 1 TABLET BY MOUTH TWICE DAILY  . diclofenac sodium (VOLTAREN) 1 % GEL APP 0.5 TO 1 GRAM EXT AA QID PRN  . Dulaglutide (TRULICITY) 1.5 MG/0.5ML SOPN Inject 1.5 mg into the skin once a week.  . Empagliflozin-metFORMIN HCl ER 25-1000 MG TB24 Take 1 tablet by mouth daily.  Marland Kitchen glucose blood (ONE TOUCH ULTRA TEST) test strip TEST BID  . Icosapent Ethyl (VASCEPA) 0.5 g CAPS Take 0.5 mg by mouth at bedtime.  . ONE TOUCH ULTRA TEST test strip 2 (two) times daily. for testing  . sitaGLIPtin (JANUVIA) 50 MG tablet Take 1 tablet (50 mg total) by mouth daily.   . traMADol (ULTRAM) 50 MG tablet TAKE 1 TABLET BY MOUTH EVERY 6 HOURS AS NEEDED FOR MODERATE PAIN  . zolpidem (AMBIEN) 10 MG tablet Take 1 tablet (10 mg total) by mouth at bedtime as needed. For sleep    Allergies: Patient is allergic to atorvastatin; cholestyramine; crestor [rosuvastatin calcium]; fenofibrate; simvastatin; soy allergy; statins; codeine; and meloxicam. Family History: Patient family history includes Arthritis in her maternal grandmother; COPD in her mother; Cancer in her paternal grandmother; Cirrhosis in her father; Depression in her brother; Diabetes in her brother, maternal grandfather, and maternal grandmother; Drug abuse in her father; Esophageal cancer in her father; Healthy in her sister; Hearing loss in her paternal grandmother; Heart attack in her brother; Heart disease in her brother; Heart failure in her mother; Liver disease in her paternal grandfather. Social History:  Patient  reports that she has never smoked. She has never used smokeless tobacco. She reports that she does not drink alcohol or use drugs.  Review of Systems: Constitutional: Negative for fever malaise or anorexia Cardiovascular: negative for chest pain Respiratory: negative for SOB or persistent cough Gastrointestinal: negative for abdominal pain  OBJECTIVE Vitals: Wt 181 lb 6.4 oz (82.3 kg)   BMI 33.18 kg/m  General: no acute distress , A&Ox3 Difficult to see clearly on video but red distinct papules present on dorsal hands w/o associated erythema; no vesicles.   Willow Ora, MD

## 2018-11-23 NOTE — Patient Instructions (Signed)
Scabies, Adult  Scabies is a skin condition that happens when very small insects get under the skin (infestation). This causes a rash and severe itchiness. Scabies can spread from person to person (is contagious). If you get scabies, it is common for others in your household to get scabies too.  With proper treatment, symptoms usually go away in 2-4 weeks. Scabies usually does not cause lasting problems.  What are the causes?  This condition is caused by mites (Sarcoptes scabiei, or human itch mites) that can only be seen with a microscope. The mites get into the top layer of skin and lay eggs. Scabies can spread from person to person through:  · Close contact with a person who has scabies.  · Contact with infested items, such as towels, bedding, or clothing.  What increases the risk?  This condition is more likely to develop in:  · People who live in nursing homes and other extended-care facilities.  · People who have sexual contact with a partner who has scabies.  · Young children who attend child care facilities.  · People who care for others who are at increased risk for scabies.  What are the signs or symptoms?  Symptoms of this condition may include:  · Severe itchiness. This is often worse at night.  · A rash that includes tiny red bumps or blisters. The rash commonly occurs on the wrist, elbow, armpit, fingers, waist, groin, or buttocks. Bumps may form a line (burrow) in some areas.  · Skin irritation. This can include scaly patches or sores.  How is this diagnosed?  This condition is diagnosed with a physical exam. Your health care provider will look closely at your skin. In some cases, your health care provider may take a sample of your affected skin (skin scraping) and have it examined under a microscope.  How is this treated?  This condition may be treated with:  · Medicated cream or lotion that kills the mites. This is spread on the entire body and left on for several hours. Usually, one treatment with  medicated cream or lotion is enough to kill all of the mites. In severe cases, the treatment may be repeated.  · Medicated cream that relieves itching.  · Medicines that help to relieve itching.  · Medicines that kill the mites. This treatment is rarely used.  Follow these instructions at home:    Medicines  · Take or apply over-the-counter and prescription medicines as told by your health care provider.  · Apply medicated cream or lotion as told by your health care provider.  · Do not wash off the medicated cream or lotion until the necessary amount of time has passed.  Skin Care  · Avoid scratching your affected skin.  · Keep your fingernails closely trimmed to reduce injury from scratching.  · Take cool baths or apply cool washcloths to help reduce itching.  General instructions  · Clean all items that you recently had contact with, including bedding, clothing, and furniture. Do this on the same day that your treatment starts.  ? Use hot water when you wash items.  ? Place unwashable items into closed, airtight plastic bags for at least 3 days. The mites cannot live for more than 3 days away from human skin.  ? Vacuum furniture and mattresses that you use.  · Make sure that other people who may have been infested are examined by a health care provider. These include members of your household and anyone who   may have had contact with infested items.  · Keep all follow-up visits as told by your health care provider. This is important.  Contact a health care provider if:  · You have itching that does not go away after 4 weeks of treatment.  · You continue to develop new bumps or burrows.  · You have redness, swelling, or pain in your rash area after treatment.  · You have fluid, blood, or pus coming from your rash.  This information is not intended to replace advice given to you by your health care provider. Make sure you discuss any questions you have with your health care provider.  Document Released: 03/19/2015  Document Revised: 12/04/2015 Document Reviewed: 01/28/2015  Elsevier Interactive Patient Education © 2019 [employer]

## 2018-11-28 ENCOUNTER — Encounter: Payer: Self-pay | Admitting: Family Medicine

## 2018-12-01 ENCOUNTER — Ambulatory Visit (INDEPENDENT_AMBULATORY_CARE_PROVIDER_SITE_OTHER): Payer: Medicare Other | Admitting: Family Medicine

## 2018-12-01 ENCOUNTER — Ambulatory Visit: Payer: Medicare Other

## 2018-12-01 ENCOUNTER — Encounter: Payer: Self-pay | Admitting: Family Medicine

## 2018-12-01 ENCOUNTER — Other Ambulatory Visit: Payer: Self-pay

## 2018-12-01 VITALS — BP 130/78 | HR 78 | Temp 98.3°F | Resp 16 | Ht 62.0 in | Wt 184.4 lb

## 2018-12-01 DIAGNOSIS — B081 Molluscum contagiosum: Secondary | ICD-10-CM | POA: Diagnosis not present

## 2018-12-01 MED ORDER — IMIQUIMOD 5 % EX CREA: TOPICAL_CREAM | CUTANEOUS | 2 refills | Status: DC

## 2018-12-01 NOTE — Progress Notes (Signed)
Subjective  CC:  Chief Complaint  Patient presents with  . Rash    Started on hands and has moved up to elbows.. She has used the cream twice and can not tell any improvement    HPI: Megan Murray is a 53 y.o. female who presents to the office today to address the problems listed above in the chief complaint.  See last note. Has rash on hands.   Assessment  1. Molluscum contagiosum      Plan   MC:  Classic. Educated and discussed prognosis. May try aldara if would like. Reassured.   Follow up: prn  01/23/2019  No orders of the defined types were placed in this encounter.  Meds ordered this encounter  Medications  . imiquimod (ALDARA) 5 % cream    Sig: Apply topically 3 (three) times a week.    Dispense:  12 each    Refill:  2      I reviewed the patients updated PMH, FH, and SocHx.    Patient Active Problem List   Diagnosis Date Noted  . Statin intolerance 06/21/2018    Priority: High  . Fibromyalgia 04/25/2018    Priority: High  . Major depression, recurrent, chronic (HCC) 04/25/2018    Priority: High  . Essential hypertension 11/09/2016    Priority: High  . Type 2 diabetes mellitus without complications (HCC) 11/09/2016    Priority: High  . 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    Priority: High  . Mixed hyperlipidemia 11/04/2016    Priority: High  . Fatty liver 05/16/2018    Priority: Medium  . Osteoarthritis, multiple sites 04/25/2018    Priority: Medium  . Seasonal allergic rhinitis due to pollen 05/19/2018    Priority: Low  . B12 deficiency 01/23/2015    Priority: Low   Current Meds  Medication Sig  . diclofenac (VOLTAREN) 75 MG EC tablet TAKE 1 TABLET BY MOUTH TWICE DAILY  . diclofenac sodium (VOLTAREN) 1 % GEL APP 0.5 TO 1 GRAM EXT AA QID PRN  . Dulaglutide (TRULICITY) 1.5 MG/0.5ML SOPN Inject 1.5 mg into the skin once a week.  . Empagliflozin-metFORMIN HCl ER 25-1000 MG TB24 Take 1 tablet by mouth daily.  Marland Kitchen glucose blood (ONE TOUCH  ULTRA TEST) test strip TEST BID  . Icosapent Ethyl (VASCEPA) 0.5 g CAPS Take 0.5 mg by mouth at bedtime.  . ONE TOUCH ULTRA TEST test strip 2 (two) times daily. for testing  . sitaGLIPtin (JANUVIA) 50 MG tablet Take 1 tablet (50 mg total) by mouth daily.  . traMADol (ULTRAM) 50 MG tablet TAKE 1 TABLET BY MOUTH EVERY 6 HOURS AS NEEDED FOR MODERATE PAIN  . zolpidem (AMBIEN) 10 MG tablet Take 1 tablet (10 mg total) by mouth at bedtime as needed. For sleep    Allergies: Patient is allergic to atorvastatin; cholestyramine; crestor [rosuvastatin calcium]; fenofibrate; simvastatin; soy allergy; statins; codeine; and meloxicam. Family History: Patient family history includes Arthritis in her maternal grandmother; COPD in her mother; Cancer in her paternal grandmother; Cirrhosis in her father; Depression in her brother; Diabetes in her brother, maternal grandfather, and maternal grandmother; Drug abuse in her father; Esophageal cancer in her father; Healthy in her sister; Hearing loss in her paternal grandmother; Heart attack in her brother; Heart disease in her brother; Heart failure in her mother; Liver disease in her paternal grandfather. Social History:  Patient  reports that she has never smoked. She has never used smokeless tobacco. She reports that she does not drink alcohol  or use drugs.  Review of Systems: Constitutional: Negative for fever malaise or anorexia Cardiovascular: negative for chest pain Respiratory: negative for SOB or persistent cough Gastrointestinal: negative for abdominal pain  Objective  Vitals: BP 130/78   Pulse 78   Temp 98.3 F (36.8 C) (Oral)   Resp 16   Ht 5\' 2"  (1.575 m)   Wt 184 lb 6.4 oz (83.6 kg)   SpO2 98%   BMI 33.73 kg/m  General: no acute distress , A&Ox3 HEENT: PEERL, conjunctiva normal, Oropharynx moist,neck is supple Cardiovascular:  RRR without murmur or gallop.  Respiratory:  Good breath sounds bilaterally, CTAB with normal respiratory effort  Skin:  Warm, several annular dome shaped flesh colored lesions with central pitting on hands and one on face     Commons side effects, risks, benefits, and alternatives for medications and treatment plan prescribed today were discussed, and the patient expressed understanding of the given instructions. Patient is instructed to call or message via MyChart if he/she has any questions or concerns regarding our treatment plan. No barriers to understanding were identified. We discussed Red Flag symptoms and signs in detail. Patient expressed understanding regarding what to do in case of urgent or emergency type symptoms.   Medication list was reconciled, printed and provided to the patient in AVS. Patient instructions and summary information was reviewed with the patient as documented in the AVS. This note was prepared with assistance of Dragon voice recognition software. Occasional wrong-word or sound-a-like substitutions may have occurred due to the inherent limitations of voice recognition software

## 2018-12-01 NOTE — Patient Instructions (Signed)
Please follow up if symptoms do not improve or as needed.    Molluscum Contagiosum, Adult Molluscum contagiosum is a skin infection that can cause a rash. Your rash may go away on its own, or you may need to have a procedure or use medicine to treat the rash. What are the causes? This condition is caused by a virus. The virus can spread from person to person (is contagious). It can spread through:  Skin-to-skin contact with an infected person.  Contact with an object that has the virus on it (contaminated object), such as a towel or clothing.  Sexual activity. What increases the risk? You may be more likely to develop this condition if you:  Live in an area where the weather is moist and warm.  Have a weak disease-fighting system (immune system). What are the signs or symptoms? The main symptom of this condition is a painless rash that appears 2-7 weeks after exposure to the virus. The rash is made up of small, dome-shaped bumps on the skin. The bumps may:  Affect the genitals, thighs, face, neck, or abdomen.  Be pink or flesh-colored.  Appear one by one or in groups.  Range from the size of a pinhead to the size of a pencil eraser.  Feel firm, smooth, and waxy.  Have a pit in the middle.  Itch. For most people, the rash does not itch. How is this diagnosed? This condition may be diagnosed based on:  Your symptoms and medical history.  A physical exam.  Scraping the bumps to collect a skin sample for testing. How is this treated? The rash usually goes away within 2 months, but it can sometimes take 6-12 months for it to clear completely. For some people, the rash may go away on its own, without treatment. In some cases, treatment may be needed to keep the virus from infecting other people or to keep the rash from spreading to other parts of your body. Treatment may also be done if you have anxiety or stress because of the way the rash looks. If you do need treatment, the  options may include:  Surgery to remove the bumps by freezing them (cryosurgery).  A procedure to scrape off the bumps (curettage).  A procedure to remove the bumps with a laser.  Putting medicine on the bumps (topical treatment). Follow these instructions at home: General instructions  Take or apply over-the-counter and prescription medicines only as told by your health care provider.  Do not scratch or pick at the bumps. Scratching or picking can cause the rash to spread to other parts of your body. Preventing infection [place] as you have bumps on your skin, the infection can spread to other people. To prevent this from happening:  Do not share clothing or towels with others until the bumps go away.  Do not use a public swimming pool, sauna, or shower until the bumps go away.  Avoid close contact with others until the bumps go away. This includes sexual contact.  Wash your hands often with soap and water. If soap and water are not available, use hand sanitizer.  Cover the bumps with clothing or a bandage when you will be near other people. Contact a health care provider if:  The bumps are spreading.  The bumps are becoming red and sore.  The bumps have not gone away after 12 months. Summary  Molluscum contagiosum is a skin infection that can cause a rash made up of small, dome-shaped bumps.  The infection is caused by a virus.  The rash usually goes away within 2 months, but it can sometimes take 6-12 months for it to clear completely.  The rash often goes away on its own. However, treatment is sometimes recommended to keep the virus from infecting other people or to keep it from spreading to other parts of your body. This information is not intended to replace advice given to you by your health care provider. Make sure you discuss any questions you have with your health care provider. Document Released: 01/23/2014 Document Revised: 07/11/2017 Document Reviewed:  07/11/2017 Elsevier Interactive Patient Education  2019 [employer]

## 2018-12-11 ENCOUNTER — Other Ambulatory Visit: Payer: Self-pay | Admitting: Family Medicine

## 2018-12-20 ENCOUNTER — Encounter: Payer: Self-pay | Admitting: Family Medicine

## 2018-12-20 DIAGNOSIS — E119 Type 2 diabetes mellitus without complications: Secondary | ICD-10-CM

## 2018-12-21 MED ORDER — GLUCOSE BLOOD VI STRP: ORAL_STRIP | 1 refills | Status: DC

## 2018-12-21 MED ORDER — ONETOUCH ULTRA 2 W/DEVICE KIT: 1.0000 | PACK | Freq: Two times a day (BID) | 0 refills | Status: DC

## 2019-01-02 ENCOUNTER — Encounter: Payer: Self-pay | Admitting: Family Medicine

## 2019-01-02 DIAGNOSIS — M797 Fibromyalgia: Secondary | ICD-10-CM

## 2019-01-02 DIAGNOSIS — M159 Polyosteoarthritis, unspecified: Secondary | ICD-10-CM

## 2019-01-10 DIAGNOSIS — M79604 Pain in right leg: Secondary | ICD-10-CM | POA: Diagnosis not present

## 2019-01-10 DIAGNOSIS — M542 Cervicalgia: Secondary | ICD-10-CM | POA: Diagnosis not present

## 2019-01-10 DIAGNOSIS — M79605 Pain in left leg: Secondary | ICD-10-CM | POA: Diagnosis not present

## 2019-01-10 DIAGNOSIS — M79601 Pain in right arm: Secondary | ICD-10-CM | POA: Diagnosis not present

## 2019-01-10 DIAGNOSIS — M79602 Pain in left arm: Secondary | ICD-10-CM | POA: Diagnosis not present

## 2019-01-15 DIAGNOSIS — M25572 Pain in left ankle and joints of left foot: Secondary | ICD-10-CM | POA: Diagnosis not present

## 2019-01-15 DIAGNOSIS — M79604 Pain in right leg: Secondary | ICD-10-CM | POA: Diagnosis not present

## 2019-01-15 DIAGNOSIS — M25562 Pain in left knee: Secondary | ICD-10-CM | POA: Diagnosis not present

## 2019-01-15 DIAGNOSIS — M79605 Pain in left leg: Secondary | ICD-10-CM | POA: Diagnosis not present

## 2019-01-15 DIAGNOSIS — M25571 Pain in right ankle and joints of right foot: Secondary | ICD-10-CM | POA: Diagnosis not present

## 2019-01-19 DIAGNOSIS — M25571 Pain in right ankle and joints of right foot: Secondary | ICD-10-CM | POA: Diagnosis not present

## 2019-01-19 DIAGNOSIS — M25572 Pain in left ankle and joints of left foot: Secondary | ICD-10-CM | POA: Diagnosis not present

## 2019-01-19 DIAGNOSIS — M79604 Pain in right leg: Secondary | ICD-10-CM | POA: Diagnosis not present

## 2019-01-19 DIAGNOSIS — M25562 Pain in left knee: Secondary | ICD-10-CM | POA: Diagnosis not present

## 2019-01-19 DIAGNOSIS — M79605 Pain in left leg: Secondary | ICD-10-CM | POA: Diagnosis not present

## 2019-01-22 DIAGNOSIS — M25562 Pain in left knee: Secondary | ICD-10-CM | POA: Diagnosis not present

## 2019-01-22 DIAGNOSIS — M79604 Pain in right leg: Secondary | ICD-10-CM | POA: Diagnosis not present

## 2019-01-22 DIAGNOSIS — M25571 Pain in right ankle and joints of right foot: Secondary | ICD-10-CM | POA: Diagnosis not present

## 2019-01-22 DIAGNOSIS — M25572 Pain in left ankle and joints of left foot: Secondary | ICD-10-CM | POA: Diagnosis not present

## 2019-01-22 DIAGNOSIS — M79605 Pain in left leg: Secondary | ICD-10-CM | POA: Diagnosis not present

## 2019-01-23 ENCOUNTER — Other Ambulatory Visit: Payer: Self-pay

## 2019-01-23 ENCOUNTER — Encounter: Payer: Self-pay | Admitting: Family Medicine

## 2019-01-23 ENCOUNTER — Ambulatory Visit (INDEPENDENT_AMBULATORY_CARE_PROVIDER_SITE_OTHER): Payer: Medicare Other | Admitting: Family Medicine

## 2019-01-23 VITALS — BP 142/92 | HR 79 | Temp 98.0°F | Resp 16 | Ht 62.0 in | Wt 184.4 lb

## 2019-01-23 DIAGNOSIS — I10 Essential (primary) hypertension: Secondary | ICD-10-CM | POA: Diagnosis not present

## 2019-01-23 DIAGNOSIS — E781 Pure hyperglyceridemia: Secondary | ICD-10-CM | POA: Diagnosis not present

## 2019-01-23 DIAGNOSIS — F339 Major depressive disorder, recurrent, unspecified: Secondary | ICD-10-CM

## 2019-01-23 DIAGNOSIS — M797 Fibromyalgia: Secondary | ICD-10-CM | POA: Diagnosis not present

## 2019-01-23 DIAGNOSIS — E119 Type 2 diabetes mellitus without complications: Secondary | ICD-10-CM

## 2019-01-23 DIAGNOSIS — Z789 Other specified health status: Secondary | ICD-10-CM

## 2019-01-23 DIAGNOSIS — E782 Mixed hyperlipidemia: Secondary | ICD-10-CM

## 2019-01-23 LAB — POCT GLYCOSYLATED HEMOGLOBIN (HGB A1C): Hemoglobin A1C: 6.9 % — AB (ref 4.0–5.6)

## 2019-01-23 LAB — COMPREHENSIVE METABOLIC PANEL
ALT: 16 U/L (ref 0–35)
AST: 13 U/L (ref 0–37)
Albumin: 4.6 g/dL (ref 3.5–5.2)
Alkaline Phosphatase: 83 U/L (ref 39–117)
BUN: 11 mg/dL (ref 6–23)
CO2: 25 mEq/L (ref 19–32)
Calcium: 9 mg/dL (ref 8.4–10.5)
Chloride: 104 mEq/L (ref 96–112)
Creatinine, Ser: 0.4 mg/dL (ref 0.40–1.20)
GFR: 166.53 mL/min (ref >60.00)
Glucose, Bld: 140 mg/dL — ABNORMAL HIGH (ref 70–99)
Potassium: 4.2 mEq/L (ref 3.5–5.1)
Sodium: 139 mEq/L (ref 135–145)
Total Bilirubin: 0.7 mg/dL (ref 0.2–1.2)
Total Protein: 6.9 g/dL (ref 6.0–8.3)

## 2019-01-23 LAB — LDL CHOLESTEROL, DIRECT: Direct LDL: 118 mg/dL

## 2019-01-23 LAB — LIPID PANEL
Cholesterol: 224 mg/dL — ABNORMAL HIGH (ref 0–200)
HDL: 37.9 mg/dL — ABNORMAL LOW (ref >39.00)
Total CHOL/HDL Ratio: 6
Triglycerides: 424 mg/dL — ABNORMAL HIGH (ref 0.0–149.0)

## 2019-01-23 MED ORDER — BUPROPION HCL ER (XL) 150 MG PO TB24: 150.0000 mg | ORAL_TABLET | Freq: Every day | ORAL | 3 refills | Status: DC

## 2019-01-23 MED ORDER — EZETIMIBE 10 MG PO TABS: 10.0000 mg | ORAL_TABLET | Freq: Every day | ORAL | 3 refills | Status: DC

## 2019-01-23 NOTE — Progress Notes (Signed)
Subjective  CC:  Chief Complaint  Patient presents with  . Diabetes    Have been getting some high readings at home  . Hyperlipidemia  . Anxiety    Has gotten worse over the past few months, has had some home/life stress  . Depression    Has gotten worse over the past few months, has had some home/life stress  . Numbness    She reports that her bottom lip in the middle will get numb and twitch, unsure if it is stress related    HPI: Megan Murray is a 53 y.o. female who presents to the office today for follow up of diabetes, hypertension and problems listed above in the chief complaint.   Diabetic f/u: Her diabetic control is reported as Unchanged. Tolerating meds.  She denies exertional CP or SOB or symptomatic hypoglycemia. She denies foot sores.    Hypertension f/u: Control is good . Pt reports she is doing well. taking medications as instructed, no medication side effects noted, no TIAs, no chest pain on exertion, no dyspnea on exertion, no swelling of ankles.  She denies adverse effects from his BP medications. Compliance with medication is good.    Hyperlipidemia f/u: Patient presents for follow up of lipids. We started vascepa in January and tolerating it fairly well although gives her heartburn at times. Reviewed intolerances to statins and repatha. LDL remains above goal. Never tried zetia.    Depression and anxiety: very active again. "i'm falling apart", tearful. 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 She is disabled herself from fibromyalgia. Had seen Dr. Toy Care in past. Last treated for depression in early 2000's. Remembers doing ok on wellbutrin. No SI. Hasn't yet been able to establish with psych nor Trey Paula. But she would like to.   Fibromyalgia: stable on ultram. UDS and contract up to date. ambien for sleep. PMDD reviewed and is appropriate  Assessment  1. Essential hypertension   2. Type 2 diabetes mellitus without  complication, without [place]-term current use of insulin ([place])   3. Familial hypertriglyceridemia   4. Fibromyalgia   5. Major depression, recurrent, chronic ([place])   6. Mixed hyperlipidemia   7. Statin intolerance      Plan   Diabetes is currently well controlled. Reassured. Continue same meds  Hypertension is currently marginally controlled. Elevated reading today but she is very upset. Will get more readings; adjust meds IF remains elevated.   Hyperlipidemia f/u:  Recheck on vascepa. Fasting today. Start zetia. Failed statins. Not a psk-9 candidate.   Depression: restart wellbutrin. Refer to psych and [place].  Diabetic education: ongoing education regarding chronic disease management for diabetes was given today. We continue to reinforce the ABC's of diabetic management: A1c (<7 or 8 dependent upon patient), tight blood pressure control, and cholesterol management with goal LDL < 100 minimally. We discuss diet strategies, exercise recommendations, medication options and possible side effects. At each visit, we review recommended immunizations and preventive care recommendations for diabetics and stress that good diabetic control can prevent other problems. See below for this patient's data. Hypertension education: ongoing education regarding management of these chronic disease states was given. Management strategies discussed on successive visits include dietary and exercise recommendations, goals of achieving and maintaining IBW, and lifestyle modifications aiming for adequate sleep and minimizing stressors.   Follow up: Return in about 3 months (around 04/25/2019) for follow up of diabetes and hypertension, mood follow up..  Orders Placed This  Encounter  Procedures  . Lipid panel  . Comprehensive metabolic panel  . POCT glycosylated hemoglobin (Hb A1C)   Meds ordered this encounter  Medications  . buPROPion (WELLBUTRIN XL) 150 MG 24 hr tablet    Sig: Take 1 tablet (150 mg total) by  mouth daily.    Dispense:  90 tablet    Refill:  3  . ezetimibe (ZETIA) 10 MG tablet    Sig: Take 1 tablet (10 mg total) by mouth daily.    Dispense:  90 tablet    Refill:  3      I reviewed the patients updated PMH, FH, and SocHx.  Patient Active Problem List   Diagnosis Date Noted  . Statin intolerance 06/21/2018    Priority: High  . Fibromyalgia 04/25/2018    Priority: High  . Major depression, recurrent, chronic ([place]) 04/25/2018    Priority: High  . Essential hypertension 11/09/2016    Priority: High  . Type 2 diabetes mellitus without complications ([place]) 76/54/6503    Priority: High  . 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    Priority: High  . Mixed hyperlipidemia 11/04/2016    Priority: High  . Fatty liver 05/16/2018    Priority: Medium  . Osteoarthritis, multiple sites 04/25/2018    Priority: Medium  . Seasonal allergic rhinitis due to pollen 05/19/2018    Priority: Low  . B12 deficiency 01/23/2015    Priority: Low   Immunization History  Administered Date(s) Administered  . Influenza-Unspecified 03/12/2018  . Pneumococcal Polysaccharide-23 01/21/2015  . Tdap 03/03/2012, 07/13/2015   Health Maintenance  Topic Date Due  . MAMMOGRAM  10/25/2017  . OPHTHALMOLOGY EXAM  10/11/2018  . HEMOGLOBIN A1C  01/22/2019  . INFLUENZA VACCINE  02/10/2019  . FOOT EXAM  04/26/2019  . URINE MICROALBUMIN  06/22/2019  . TETANUS/TDAP  07/12/2025  . COLONOSCOPY  01/09/2028  . PNEUMOCOCCAL POLYSACCHARIDE VACCINE AGE 59-64 HIGH RISK  Completed  . HIV Screening  Completed   Diabetes and HTN Related Lab Review: Lab Results  Component Value Date   HGBA1C 6.9 (A) 01/23/2019   HGBA1C 7.1 (A) 07/24/2018   HGBA1C 6.9 (A) 06/21/2018    Lab Results  Component Value Date   MICROALBUR 1.0 06/21/2018   Lab Results  Component Value Date   CREATININE 0.47 07/24/2018   BUN 11 07/24/2018   NA 137 07/24/2018   K 4.0 07/24/2018   CL 104 07/24/2018   CO2 21 07/24/2018   Lab  Results  Component Value Date   CHOL 248 (H) 07/24/2018   CHOL 151 03/14/2018   Lab Results  Component Value Date   HDL 40.50 07/24/2018   HDL 47 03/14/2018   Lab Results  Component Value Date   LDLCALC 104 03/14/2018   Lab Results  Component Value Date   TRIG 362.0 (H) 07/24/2018   TRIG 273 (A) 03/14/2018   Lab Results  Component Value Date   CHOLHDL 6 07/24/2018   Lab Results  Component Value Date   LDLDIRECT 139.0 07/24/2018   The 10-year ASCVD risk score Mikey Bussing DC Jr., et al., 2013) is: 6.8%   Values used to calculate the score:     Age: 57 years     Sex: Female     Is Non-Hispanic African American: No     Diabetic: Yes     Tobacco smoker: No     Systolic Blood Pressure: 546 mmHg     Is BP treated: No     HDL Cholesterol: 40.5 mg/dL  Total Cholesterol: 248 mg/dL  BP Readings from Last 3 Encounters:  01/23/19 (!) 142/92  12/01/18 130/78  10/24/18 124/88   Wt Readings from Last 3 Encounters:  01/23/19 184 lb 6.4 oz (83.6 kg)  12/01/18 184 lb 6.4 oz (83.6 kg)  11/23/18 181 lb 6.4 oz (82.3 kg)    Allergies: Patient is allergic to atorvastatin; cholestyramine; crestor [rosuvastatin calcium]; fenofibrate; simvastatin; soy allergy; statins; codeine; and meloxicam. Family History: Patient family history includes Arthritis in her maternal grandmother; COPD in her mother; Cancer in her paternal grandmother; Cirrhosis in her father; Depression in her brother; Diabetes in her brother, maternal grandfather, and maternal grandmother; Drug abuse in her father; Esophageal cancer in her father; Healthy in her sister; Hearing loss in her paternal grandmother; Heart attack in her brother; Heart disease in her brother; Heart failure in her mother; Liver disease in her paternal grandfather. Social History:  Patient  reports that she has never smoked. She has never used smokeless tobacco. She reports that she does not drink alcohol or use drugs.  Review of Systems:  Ophthalmic: negative for eye pain, loss of vision or double vision Cardiovascular: negative for chest pain Respiratory: negative for SOB or persistent cough Gastrointestinal: negative for abdominal pain Genitourinary: negative for dysuria or gross hematuria MSK: negative for foot lesions Neurologic: negative for weakness or gait disturbance Current Meds  Medication Sig  . Blood Glucose Monitoring Suppl (ONE TOUCH ULTRA 2) w/Device KIT 1 kit by Does not apply route 2 (two) times daily.  . diclofenac (VOLTAREN) 75 MG EC tablet TAKE 1 TABLET BY MOUTH TWICE DAILY  . diclofenac sodium (VOLTAREN) 1 % GEL APP 0.5 TO 1 GRAM EXT AA QID PRN  . Dulaglutide (TRULICITY) 1.5 PO/2.4MP SOPN Inject 1.5 mg into the skin once a week.  . Empagliflozin-metFORMIN HCl ER 25-1000 MG TB24 Take 1 tablet by mouth daily.  Marland Kitchen glucose blood (ONE TOUCH ULTRA TEST) test strip TEST BID  . glucose blood (ONE TOUCH ULTRA TEST) test strip for testing twice daily  . Icosapent Ethyl (VASCEPA) 0.5 g CAPS Take 0.5 mg by mouth at bedtime.  . imiquimod (ALDARA) 5 % cream Apply topically 3 (three) times a week.  . sitaGLIPtin (JANUVIA) 50 MG tablet Take 1 tablet (50 mg total) by mouth daily.  . traMADol (ULTRAM) 50 MG tablet TAKE 1 TABLET BY MOUTH EVERY 6 HOURS AS NEEDED FOR MODERATE PAIN  . zolpidem (AMBIEN) 10 MG tablet Take 1 tablet (10 mg total) by mouth at bedtime as needed. For sleep    Objective  Vitals: BP (!) 142/92   Pulse 79   Temp 98 F (36.7 C) (Oral)   Resp 16   Ht '5\' 2"'$  (1.575 m)   Wt 184 lb 6.4 oz (83.6 kg)   SpO2 98%   BMI 33.73 kg/m  General: well appearing, but crying. Psych:  Alert and oriented, normal mood and affect HEENT:  Normocephalic, atraumatic, moist mucous membranes, supple neck  Cardiovascular:  Nl S1 and S2, RRR without murmur, gallop or rub. no edema Respiratory:  Good breath sounds bilaterally, CTAB with normal effort, no rales Gastrointestinal: normal BS, soft, nontender Skin:  Warm,  no rashes Neurologic:   Mental status is normal. normal gait    Commons side effects, risks, benefits, and alternatives for medications and treatment plan prescribed today were discussed, and the patient expressed understanding of the given instructions. Patient is instructed to call or message via MyChart if he/she has any questions or concerns regarding  our treatment plan. No barriers to understanding were identified. We discussed Red Flag symptoms and signs in detail. Patient expressed understanding regarding what to do in case of urgent or emergency type symptoms.   Medication list was reconciled, printed and provided to the patient in AVS. Patient instructions and summary information was reviewed with the patient as documented in the AVS. This note was prepared with assistance of Dragon voice recognition software. Occasional wrong-word or sound-a-like substitutions may have occurred due to the inherent limitations of voice recognition software

## 2019-01-23 NOTE — Patient Instructions (Signed)
Please return in 3 months for diabetes follow up and mood follow up.   Start the zetia slowly as discussed. Start the wellbutrin.  Please call [facility] to schedule an appointment with Dr. Trey Paula; she is a therapist here at our [place] office.  The phone number is: [phone] Try to get established with a psychiatrist as well.   Your diabetes is controlled!  I will release your lab results to you on your MyChart account with further instructions. Please reply with any questions.     If you have any questions or concerns, please don't hesitate to send me a message via MyChart or call the office at [phone]. Thank you for visiting with Megan Murray today! It's our pleasure caring for you.

## 2019-01-29 DIAGNOSIS — M79605 Pain in left leg: Secondary | ICD-10-CM | POA: Diagnosis not present

## 2019-01-29 DIAGNOSIS — M79604 Pain in right leg: Secondary | ICD-10-CM | POA: Diagnosis not present

## 2019-01-29 DIAGNOSIS — M25562 Pain in left knee: Secondary | ICD-10-CM | POA: Diagnosis not present

## 2019-01-29 DIAGNOSIS — M25571 Pain in right ankle and joints of right foot: Secondary | ICD-10-CM | POA: Diagnosis not present

## 2019-01-29 DIAGNOSIS — M25572 Pain in left ankle and joints of left foot: Secondary | ICD-10-CM | POA: Diagnosis not present

## 2019-02-02 DIAGNOSIS — M79605 Pain in left leg: Secondary | ICD-10-CM | POA: Diagnosis not present

## 2019-02-02 DIAGNOSIS — M25572 Pain in left ankle and joints of left foot: Secondary | ICD-10-CM | POA: Diagnosis not present

## 2019-02-02 DIAGNOSIS — M79604 Pain in right leg: Secondary | ICD-10-CM | POA: Diagnosis not present

## 2019-02-02 DIAGNOSIS — M25562 Pain in left knee: Secondary | ICD-10-CM | POA: Diagnosis not present

## 2019-02-02 DIAGNOSIS — M25571 Pain in right ankle and joints of right foot: Secondary | ICD-10-CM | POA: Diagnosis not present

## 2019-02-05 DIAGNOSIS — M79605 Pain in left leg: Secondary | ICD-10-CM | POA: Diagnosis not present

## 2019-02-05 DIAGNOSIS — M25571 Pain in right ankle and joints of right foot: Secondary | ICD-10-CM | POA: Diagnosis not present

## 2019-02-05 DIAGNOSIS — M25572 Pain in left ankle and joints of left foot: Secondary | ICD-10-CM | POA: Diagnosis not present

## 2019-02-05 DIAGNOSIS — M79604 Pain in right leg: Secondary | ICD-10-CM | POA: Diagnosis not present

## 2019-02-05 DIAGNOSIS — M25562 Pain in left knee: Secondary | ICD-10-CM | POA: Diagnosis not present

## 2019-02-08 ENCOUNTER — Ambulatory Visit: Payer: Medicare Other | Admitting: Psychology

## 2019-02-08 ENCOUNTER — Telehealth: Payer: Self-pay | Admitting: Physical Therapy

## 2019-02-08 NOTE — Telephone Encounter (Signed)
Copied from [place] [id number]. Topic: Appointment Scheduling - Scheduling Inquiry for Clinic >> Feb 08, 2019  2:56 PM Rainey Pines A wrote: Patient would like to be seen in office for anxiety and depression/therapy. Patient stated that she tried to do a evisit today with  [facility] today and they had technical difficulties and was to go to a different practice. Patient wants to speak with Dr Jonni Sanger or her nurse. Patient looking for resolution because she was referred by Dr. Jonni Sanger

## 2019-02-08 NOTE — Telephone Encounter (Signed)
Given Covid pandemic, in person counseling will availability will need to be researched.  I recommend looking at her insurance coverage for "covered practices" and then calling them to see if they are seeing people in person at this time.

## 2019-02-08 NOTE — Telephone Encounter (Signed)
Called Megan Murray she reports that her and [name] had technical difficulties trying to have a virtual visit. She states she is not happy with [name] told her to call [facility] to get a face to face evaluation. She did call but they did not accept her insurance. She is wanting to get in with someone other than [name] if possible. Please advise

## 2019-02-09 DIAGNOSIS — M25562 Pain in left knee: Secondary | ICD-10-CM | POA: Diagnosis not present

## 2019-02-09 DIAGNOSIS — M79605 Pain in left leg: Secondary | ICD-10-CM | POA: Diagnosis not present

## 2019-02-09 DIAGNOSIS — M25571 Pain in right ankle and joints of right foot: Secondary | ICD-10-CM | POA: Diagnosis not present

## 2019-02-09 DIAGNOSIS — M79604 Pain in right leg: Secondary | ICD-10-CM | POA: Diagnosis not present

## 2019-02-09 DIAGNOSIS — M25572 Pain in left ankle and joints of left foot: Secondary | ICD-10-CM | POA: Diagnosis not present

## 2019-02-09 NOTE — Telephone Encounter (Signed)
Spoke with pt she states that it is to much work for her to call herself so at this time she will not move forward with counseling.

## 2019-02-14 ENCOUNTER — Other Ambulatory Visit: Payer: Self-pay | Admitting: *Deleted

## 2019-02-14 DIAGNOSIS — E119 Type 2 diabetes mellitus without complications: Secondary | ICD-10-CM

## 2019-02-14 MED ORDER — GLUCOSE BLOOD VI STRP: ORAL_STRIP | 2 refills | Status: DC

## 2019-02-19 ENCOUNTER — Ambulatory Visit: Payer: Medicare Other | Admitting: Psychology

## 2019-02-19 DIAGNOSIS — M25572 Pain in left ankle and joints of left foot: Secondary | ICD-10-CM | POA: Diagnosis not present

## 2019-02-19 DIAGNOSIS — M79604 Pain in right leg: Secondary | ICD-10-CM | POA: Diagnosis not present

## 2019-02-19 DIAGNOSIS — M79605 Pain in left leg: Secondary | ICD-10-CM | POA: Diagnosis not present

## 2019-02-19 DIAGNOSIS — M25562 Pain in left knee: Secondary | ICD-10-CM | POA: Diagnosis not present

## 2019-02-19 DIAGNOSIS — M25571 Pain in right ankle and joints of right foot: Secondary | ICD-10-CM | POA: Diagnosis not present

## 2019-02-23 ENCOUNTER — Telehealth: Payer: Self-pay | Admitting: Family Medicine

## 2019-02-23 DIAGNOSIS — M79605 Pain in left leg: Secondary | ICD-10-CM | POA: Diagnosis not present

## 2019-02-23 DIAGNOSIS — M25562 Pain in left knee: Secondary | ICD-10-CM | POA: Diagnosis not present

## 2019-02-23 DIAGNOSIS — M79604 Pain in right leg: Secondary | ICD-10-CM | POA: Diagnosis not present

## 2019-02-23 DIAGNOSIS — M25571 Pain in right ankle and joints of right foot: Secondary | ICD-10-CM | POA: Diagnosis not present

## 2019-02-23 DIAGNOSIS — M25572 Pain in left ankle and joints of left foot: Secondary | ICD-10-CM | POA: Diagnosis not present

## 2019-02-23 NOTE — Telephone Encounter (Signed)
I called patient to schedule an appointment for a virtual visit tomorrow since [facility] and other [facility] offices were booked today. She declined. She wanted to speak to a clinical team member. I transferred the call to [place] to assist.   Copied from [place] [id number]. Topic: Appointment Scheduling - Scheduling Inquiry for Clinic >> Feb 23, 2019 12:20 PM Pauline Good wrote: Reason for CRM: pt need appt for ear pain x 2 days. Pt want appt today after 3 with anyone

## 2019-02-23 NOTE — Telephone Encounter (Signed)
Triaged call. Pt is reports she had a severe headache yesterday that was causing earpain. She states she has tried several OTC medications as well as Tramadol. SXS of HA are better today but reports ear heaviness and slight sore throat. Pt made aware all [facility] are full with no appointment, offered Saturday which is virtually(Refused), ER and  Urgent Care (refused). She then started cursing and stated she will find a new provider before hanging up.

## 2019-02-24 ENCOUNTER — Other Ambulatory Visit: Payer: Self-pay | Admitting: Family Medicine

## 2019-02-26 DIAGNOSIS — M79604 Pain in right leg: Secondary | ICD-10-CM | POA: Diagnosis not present

## 2019-02-26 DIAGNOSIS — M25572 Pain in left ankle and joints of left foot: Secondary | ICD-10-CM | POA: Diagnosis not present

## 2019-02-26 DIAGNOSIS — M25562 Pain in left knee: Secondary | ICD-10-CM | POA: Diagnosis not present

## 2019-02-26 DIAGNOSIS — M79605 Pain in left leg: Secondary | ICD-10-CM | POA: Diagnosis not present

## 2019-02-26 DIAGNOSIS — M25571 Pain in right ankle and joints of right foot: Secondary | ICD-10-CM | POA: Diagnosis not present

## 2019-03-02 DIAGNOSIS — M25572 Pain in left ankle and joints of left foot: Secondary | ICD-10-CM | POA: Diagnosis not present

## 2019-03-02 DIAGNOSIS — M25562 Pain in left knee: Secondary | ICD-10-CM | POA: Diagnosis not present

## 2019-03-02 DIAGNOSIS — M79604 Pain in right leg: Secondary | ICD-10-CM | POA: Diagnosis not present

## 2019-03-02 DIAGNOSIS — M79605 Pain in left leg: Secondary | ICD-10-CM | POA: Diagnosis not present

## 2019-03-02 DIAGNOSIS — M25571 Pain in right ankle and joints of right foot: Secondary | ICD-10-CM | POA: Diagnosis not present

## 2019-03-05 ENCOUNTER — Ambulatory Visit: Payer: Medicare Other | Admitting: Psychology

## 2019-03-05 DIAGNOSIS — M79604 Pain in right leg: Secondary | ICD-10-CM | POA: Diagnosis not present

## 2019-03-05 DIAGNOSIS — M25571 Pain in right ankle and joints of right foot: Secondary | ICD-10-CM | POA: Diagnosis not present

## 2019-03-05 DIAGNOSIS — M25562 Pain in left knee: Secondary | ICD-10-CM | POA: Diagnosis not present

## 2019-03-05 DIAGNOSIS — M25572 Pain in left ankle and joints of left foot: Secondary | ICD-10-CM | POA: Diagnosis not present

## 2019-03-05 DIAGNOSIS — M79605 Pain in left leg: Secondary | ICD-10-CM | POA: Diagnosis not present

## 2019-03-13 ENCOUNTER — Other Ambulatory Visit: Payer: Self-pay | Admitting: Family Medicine

## 2019-03-13 DIAGNOSIS — M25562 Pain in left knee: Secondary | ICD-10-CM | POA: Diagnosis not present

## 2019-03-13 DIAGNOSIS — M25572 Pain in left ankle and joints of left foot: Secondary | ICD-10-CM | POA: Diagnosis not present

## 2019-03-13 DIAGNOSIS — M25571 Pain in right ankle and joints of right foot: Secondary | ICD-10-CM | POA: Diagnosis not present

## 2019-03-13 DIAGNOSIS — M79605 Pain in left leg: Secondary | ICD-10-CM | POA: Diagnosis not present

## 2019-03-13 DIAGNOSIS — M79604 Pain in right leg: Secondary | ICD-10-CM | POA: Diagnosis not present

## 2019-03-15 DIAGNOSIS — M25571 Pain in right ankle and joints of right foot: Secondary | ICD-10-CM | POA: Diagnosis not present

## 2019-03-15 DIAGNOSIS — M79605 Pain in left leg: Secondary | ICD-10-CM | POA: Diagnosis not present

## 2019-03-15 DIAGNOSIS — M79604 Pain in right leg: Secondary | ICD-10-CM | POA: Diagnosis not present

## 2019-03-15 DIAGNOSIS — M25562 Pain in left knee: Secondary | ICD-10-CM | POA: Diagnosis not present

## 2019-03-15 DIAGNOSIS — M25572 Pain in left ankle and joints of left foot: Secondary | ICD-10-CM | POA: Diagnosis not present

## 2019-03-26 DIAGNOSIS — M25562 Pain in left knee: Secondary | ICD-10-CM | POA: Diagnosis not present

## 2019-03-26 DIAGNOSIS — M79605 Pain in left leg: Secondary | ICD-10-CM | POA: Diagnosis not present

## 2019-03-26 DIAGNOSIS — M25571 Pain in right ankle and joints of right foot: Secondary | ICD-10-CM | POA: Diagnosis not present

## 2019-03-26 DIAGNOSIS — M25572 Pain in left ankle and joints of left foot: Secondary | ICD-10-CM | POA: Diagnosis not present

## 2019-03-26 DIAGNOSIS — M79604 Pain in right leg: Secondary | ICD-10-CM | POA: Diagnosis not present

## 2019-03-28 ENCOUNTER — Other Ambulatory Visit: Payer: Self-pay

## 2019-03-28 ENCOUNTER — Ambulatory Visit (INDEPENDENT_AMBULATORY_CARE_PROVIDER_SITE_OTHER): Payer: Medicare Other

## 2019-03-28 DIAGNOSIS — Z23 Encounter for immunization: Secondary | ICD-10-CM | POA: Diagnosis not present

## 2019-03-28 NOTE — Progress Notes (Signed)
Pt came into clinic today very irate demanding she receive the High dose FLU shot Fluad. Pt was told due to her  age she would only qualify for regular dose Flu vaccine, the patient began to get really upset cursing stating it made no sense why she could not receive the high dose vaccine when she had received it for the last three years by her previous provider Dr. Tamala Julian. I in turn told the patient I would speak with Dr. Jonni Sanger to see if she could get the vaccine. Dr. Peterson Lombard the vaccine but wanted me to inform the patient that prior to the vaccine she would have to be informed that she may be charged full price for the vaccine and that it was contraindicated for her. Pt okayed and was given the high dose vaccine

## 2019-03-30 DIAGNOSIS — M79605 Pain in left leg: Secondary | ICD-10-CM | POA: Diagnosis not present

## 2019-03-30 DIAGNOSIS — M25562 Pain in left knee: Secondary | ICD-10-CM | POA: Diagnosis not present

## 2019-03-30 DIAGNOSIS — M25572 Pain in left ankle and joints of left foot: Secondary | ICD-10-CM | POA: Diagnosis not present

## 2019-03-30 DIAGNOSIS — M79604 Pain in right leg: Secondary | ICD-10-CM | POA: Diagnosis not present

## 2019-03-30 DIAGNOSIS — M25571 Pain in right ankle and joints of right foot: Secondary | ICD-10-CM | POA: Diagnosis not present

## 2019-04-04 ENCOUNTER — Telehealth: Payer: Self-pay | Admitting: *Deleted

## 2019-04-04 DIAGNOSIS — M79604 Pain in right leg: Secondary | ICD-10-CM | POA: Diagnosis not present

## 2019-04-04 DIAGNOSIS — M25572 Pain in left ankle and joints of left foot: Secondary | ICD-10-CM | POA: Diagnosis not present

## 2019-04-04 DIAGNOSIS — M25562 Pain in left knee: Secondary | ICD-10-CM | POA: Diagnosis not present

## 2019-04-04 DIAGNOSIS — M25571 Pain in right ankle and joints of right foot: Secondary | ICD-10-CM | POA: Diagnosis not present

## 2019-04-04 DIAGNOSIS — M79605 Pain in left leg: Secondary | ICD-10-CM | POA: Diagnosis not present

## 2019-04-04 NOTE — Telephone Encounter (Signed)
Patient called in asking to transfer care from Dr. Jonni Sanger to [name], PA-C so that she can remain at the [facility]

## 2019-04-04 NOTE — Telephone Encounter (Signed)
Let patient know of approval.  3 month appt with Dr. Jonni Sanger canceled, TOC appt with [facility] made.

## 2019-04-04 NOTE — Telephone Encounter (Signed)
Yes, please.

## 2019-04-04 NOTE — Telephone Encounter (Signed)
Ok with me if Dr. Jonni Sanger gives ok. Would need to be scheduled for TOC visit.

## 2019-04-06 DIAGNOSIS — M25562 Pain in left knee: Secondary | ICD-10-CM | POA: Diagnosis not present

## 2019-04-06 DIAGNOSIS — M79605 Pain in left leg: Secondary | ICD-10-CM | POA: Diagnosis not present

## 2019-04-06 DIAGNOSIS — M25572 Pain in left ankle and joints of left foot: Secondary | ICD-10-CM | POA: Diagnosis not present

## 2019-04-06 DIAGNOSIS — M25571 Pain in right ankle and joints of right foot: Secondary | ICD-10-CM | POA: Diagnosis not present

## 2019-04-06 DIAGNOSIS — M79604 Pain in right leg: Secondary | ICD-10-CM | POA: Diagnosis not present

## 2019-04-09 DIAGNOSIS — M25572 Pain in left ankle and joints of left foot: Secondary | ICD-10-CM | POA: Diagnosis not present

## 2019-04-09 DIAGNOSIS — M25562 Pain in left knee: Secondary | ICD-10-CM | POA: Diagnosis not present

## 2019-04-09 DIAGNOSIS — M25571 Pain in right ankle and joints of right foot: Secondary | ICD-10-CM | POA: Diagnosis not present

## 2019-04-09 DIAGNOSIS — M79604 Pain in right leg: Secondary | ICD-10-CM | POA: Diagnosis not present

## 2019-04-09 DIAGNOSIS — M79605 Pain in left leg: Secondary | ICD-10-CM | POA: Diagnosis not present

## 2019-04-12 ENCOUNTER — Other Ambulatory Visit: Payer: Self-pay | Admitting: Family Medicine

## 2019-04-16 ENCOUNTER — Encounter: Payer: Self-pay | Admitting: Gastroenterology

## 2019-04-24 ENCOUNTER — Other Ambulatory Visit: Payer: Self-pay | Admitting: Family Medicine

## 2019-04-25 ENCOUNTER — Ambulatory Visit: Payer: Medicare Other | Admitting: Family Medicine

## 2019-04-26 ENCOUNTER — Ambulatory Visit (INDEPENDENT_AMBULATORY_CARE_PROVIDER_SITE_OTHER): Payer: Medicare Other | Admitting: Physician Assistant

## 2019-04-26 ENCOUNTER — Encounter: Payer: Self-pay | Admitting: Physician Assistant

## 2019-04-26 ENCOUNTER — Other Ambulatory Visit: Payer: Self-pay

## 2019-04-26 VITALS — BP 120/82 | HR 81 | Temp 98.5°F | Resp 16 | Ht 62.0 in | Wt 182.0 lb

## 2019-04-26 DIAGNOSIS — I10 Essential (primary) hypertension: Secondary | ICD-10-CM

## 2019-04-26 DIAGNOSIS — E119 Type 2 diabetes mellitus without complications: Secondary | ICD-10-CM | POA: Diagnosis not present

## 2019-04-26 DIAGNOSIS — E782 Mixed hyperlipidemia: Secondary | ICD-10-CM | POA: Diagnosis not present

## 2019-04-26 DIAGNOSIS — M159 Polyosteoarthritis, unspecified: Secondary | ICD-10-CM

## 2019-04-26 DIAGNOSIS — F339 Major depressive disorder, recurrent, unspecified: Secondary | ICD-10-CM

## 2019-04-26 DIAGNOSIS — R5382 Chronic fatigue, unspecified: Secondary | ICD-10-CM

## 2019-04-26 DIAGNOSIS — E538 Deficiency of other specified B group vitamins: Secondary | ICD-10-CM | POA: Diagnosis not present

## 2019-04-26 DIAGNOSIS — M8949 Other hypertrophic osteoarthropathy, multiple sites: Secondary | ICD-10-CM

## 2019-04-26 DIAGNOSIS — M797 Fibromyalgia: Secondary | ICD-10-CM

## 2019-04-26 DIAGNOSIS — K219 Gastro-esophageal reflux disease without esophagitis: Secondary | ICD-10-CM

## 2019-04-26 HISTORY — DX: Gastro-esophageal reflux disease without esophagitis: K21.9

## 2019-04-26 HISTORY — DX: Chronic fatigue, unspecified: R53.82

## 2019-04-26 LAB — LIPID PANEL
Cholesterol: 232 mg/dL — ABNORMAL HIGH (ref 0–200)
HDL: 39.4 mg/dL (ref >39.00)
NonHDL: 192.68
Total CHOL/HDL Ratio: 6
Triglycerides: 369 mg/dL — ABNORMAL HIGH (ref 0.0–149.0)
VLDL: 73.8 mg/dL — ABNORMAL HIGH (ref 0.0–40.0)

## 2019-04-26 LAB — HEMOGLOBIN A1C: Hgb A1c MFr Bld: 6.9 % — ABNORMAL HIGH (ref 4.6–6.5)

## 2019-04-26 LAB — CBC WITH DIFFERENTIAL/PLATELET
Basophils Absolute: 0 10*3/uL (ref 0.0–0.1)
Basophils Relative: 0.6 % (ref 0.0–3.0)
Eosinophils Absolute: 0.2 10*3/uL (ref 0.0–0.7)
Eosinophils Relative: 2.5 % (ref 0.0–5.0)
HCT: 48 % — ABNORMAL HIGH (ref 36.0–46.0)
Hemoglobin: 16.2 g/dL — ABNORMAL HIGH (ref 12.0–15.0)
Lymphocytes Relative: 26.7 % (ref 12.0–46.0)
Lymphs Abs: 2 10*3/uL (ref 0.7–4.0)
MCHC: 33.7 g/dL (ref 30.0–36.0)
MCV: 89.6 fl (ref 78.0–100.0)
Monocytes Absolute: 0.5 10*3/uL (ref 0.1–1.0)
Monocytes Relative: 6.2 % (ref 3.0–12.0)
Neutro Abs: 4.8 10*3/uL (ref 1.4–7.7)
Neutrophils Relative %: 64 % (ref 43.0–77.0)
Platelets: 216 10*3/uL (ref 150.0–400.0)
RBC: 5.36 Mil/uL — ABNORMAL HIGH (ref 3.87–5.11)
RDW: 13.5 % (ref 11.5–15.5)
WBC: 7.5 10*3/uL (ref 4.0–10.5)

## 2019-04-26 LAB — COMPREHENSIVE METABOLIC PANEL
ALT: 19 U/L (ref 0–35)
AST: 21 U/L (ref 0–37)
Albumin: 4.7 g/dL (ref 3.5–5.2)
Alkaline Phosphatase: 90 U/L (ref 39–117)
BUN: 15 mg/dL (ref 6–23)
CO2: 24 mEq/L (ref 19–32)
Calcium: 9.2 mg/dL (ref 8.4–10.5)
Chloride: 103 mEq/L (ref 96–112)
Creatinine, Ser: 0.39 mg/dL — ABNORMAL LOW (ref 0.40–1.20)
GFR: 171.3 mL/min (ref >60.00)
Glucose, Bld: 145 mg/dL — ABNORMAL HIGH (ref 70–99)
Potassium: 4.3 mEq/L (ref 3.5–5.1)
Sodium: 137 mEq/L (ref 135–145)
Total Bilirubin: 0.8 mg/dL (ref 0.2–1.2)
Total Protein: 7.3 g/dL (ref 6.0–8.3)

## 2019-04-26 LAB — LDL CHOLESTEROL, DIRECT: Direct LDL: 125 mg/dL

## 2019-04-26 LAB — VITAMIN B12: Vitamin B-12: 513 pg/mL (ref 211–911)

## 2019-04-26 LAB — VITAMIN D 25 HYDROXY (VIT D DEFICIENCY, FRACTURES): VITD: 55.88 ng/mL (ref 30.00–100.00)

## 2019-04-26 LAB — TSH: TSH: 1.06 u[IU]/mL (ref 0.35–4.50)

## 2019-04-26 LAB — H. PYLORI ANTIBODY, IGG: H Pylori IgG: NEGATIVE

## 2019-04-26 MED ORDER — PANTOPRAZOLE SODIUM 40 MG PO TBEC: 40.0000 mg | DELAYED_RELEASE_TABLET | Freq: Every day | ORAL | 3 refills | Status: DC

## 2019-04-26 NOTE — Patient Instructions (Addendum)
Please go to the lab today for blood work.  I will call you with your results. We will alter treatment regimen(s) if indicated by your results.   Please start the dietary recommendations below. I also want you to start taking the Protonix daily as directed over the next few weeks to calm down the symptoms you are having.  Please follow-up with Gastroenterology as scheduled.   We will start a new medication for depression and anxiety once lab results are in and we make sure that thyroid is not a big culprit of this.   We will make further changes once we get these results in.  If was very nice meeting you today!   Food Choices for Gastroesophageal Reflux Disease, Adult When you have gastroesophageal reflux disease (GERD), the foods you eat and your eating habits are very important. Choosing the right foods can help ease your discomfort. Think about working with a nutrition specialist (dietitian) to help you make good choices. What are tips for following this plan?  Meals  Choose healthy foods that are low in fat, such as fruits, vegetables, whole grains, low-fat dairy products, and lean meat, fish, and poultry.  Eat small meals often instead of 3 large meals a day. Eat your meals slowly, and in a place where you are relaxed. Avoid bending over or lying down until 2-3 hours after eating.  Avoid eating meals 2-3 hours before bed.  Avoid drinking a lot of liquid with meals.  Cook foods using methods other than frying. Bake, grill, or broil food instead.  Avoid or limit: ? Chocolate. ? Peppermint or spearmint. ? Alcohol. ? Pepper. ? Black and decaffeinated coffee. ? Black and decaffeinated tea. ? Bubbly (carbonated) soft drinks. ? Caffeinated energy drinks and soft drinks.  Limit high-fat foods such as: ? Fatty meat or fried foods. ? Whole milk, cream, butter, or ice cream. ? Nuts and nut butters. ? Pastries, donuts, and sweets made with butter or shortening.  Avoid foods  that cause symptoms. These foods may be different for everyone. Common foods that cause symptoms include: ? Tomatoes. ? Oranges, lemons, and limes. ? Peppers. ? Spicy food. ? Onions and garlic. ? Vinegar. Lifestyle  Maintain a healthy weight. Ask your doctor what weight is healthy for you. If you need to lose weight, work with your doctor to do so safely.  Exercise for at least 30 minutes for 5 or more days each week, or as told by your doctor.  Wear loose-fitting clothes.  Do not smoke. If you need help quitting, ask your doctor.  Sleep with the head of your bed higher than your feet. Use a wedge under the mattress or blocks under the bed frame to raise the head of the bed. Summary  When you have gastroesophageal reflux disease (GERD), food and lifestyle choices are very important in easing your symptoms.  Eat small meals often instead of 3 large meals a day. Eat your meals slowly, and in a place where you are relaxed.  Limit high-fat foods such as fatty meat or fried foods.  Avoid bending over or lying down until 2-3 hours after eating.  Avoid peppermint and spearmint, caffeine, alcohol, and chocolate. This information is not intended to replace advice given to you by your health care provider. Make sure you discuss any questions you have with your health care provider. Document Released: 12/28/2011 Document Revised: 10/19/2018 Document Reviewed: 08/03/2016 Elsevier Patient Education  2020 [employer].

## 2019-04-26 NOTE — Assessment & Plan Note (Signed)
Off of medication for 4 weeks due to increasing GERD symptoms with Vascepa.  Has been intolerant to statins before.  Will recheck fasting lipid panel to assess current state.

## 2019-04-26 NOTE — Assessment & Plan Note (Signed)
Suspect this is related to her depression but this could very well be multifactorial.  We will check full lab panel today to include CBC, TSH, vitamin D, L57 and metabolic panel.

## 2019-04-26 NOTE — Assessment & Plan Note (Signed)
BP normotensive. Asymptomatic. Continue diet and exercise. Will monitor.

## 2019-04-26 NOTE — Assessment & Plan Note (Signed)
Nash-standing history. Controlled with stretches, Voltaren gel and occasional Tramadol when needed. Continue current regimen.

## 2019-04-26 NOTE — Assessment & Plan Note (Signed)
Previously followed by Dr. Toy Care.  No current treatment.  Having some ongoing symptoms.  Suspect her chronic fatigue is mainly stemming from depression but we are checking a lab panel today.  Discussed with her if this is unremarkable we really should consider restarting medication for this.  Counseling recommended.  Handout given to patient.

## 2019-04-26 NOTE — Progress Notes (Signed)
Patient presents to clinic today to transfer care from Dr. Jonni Sanger at our [facility] office.  Patient notes this location is more beneficial to her.  Patient does have some ongoing issues she would like to discuss today.   DM II  -- controlled without complication. Is currently on a regimen of Januvia 50 mg once daily, Synjardy 25-1000 mg daily and Trulicity 1.5 mg once weekly. Endorses taking as directed. Does not check glucose levels as sugars have been well controlled overall.  Denies known history of nephropathy, neuropathy or retinopathy from diabetes.  Is not currently on ACE or ARB.  Is due for urine microalbumin/creatinine ratio.  Last eye exam 1 year ago -- has had appt but canceled twice.  Has rescheduled this for next Saturday. Denies significant vision changes.   HLD w DM II --Mixed hyperlipidemia and history of familial hypertriglyceridemia. Currently on Vasscepa nightly. Off of medication x 4 weeks due to worsening GERD.  Patient is unable to tolerate statins.  Is trying to watch her diet and keep active.  Osteoarthritis of multiple sites Tramadol -- once per day at night. Rarely takes BID. OA multiple sites, Fibromyalgia.   Insomnia Ambien since 2004. Cannot sleep without this. Has tried other medications and to wean off of this without success previously.   Patient endorses sensation of sternal tightness, heart burn, increase belching an dearly satiety. Denies abdominal pain or vomiting. Occasional nausea. Father with hx of esophageal Ca. Significant family history of GERD.  Fatigue -- 6 weeks. Stressors -- since April -- father diet in April, taking care of estate during [place], husband losing job in December (company closing). + hx depression, previously on Wellbutrin but stopped due to GERD symptoms.  Notes depressed mood and anhedonia. Denies SI/HI. Has history of Vit D and B12 deficiency, only Vit D being treated currently. Denies snoring, non-restful sleep or noted apneic  episodes.    Past Medical History:  Diagnosis Date  . Asthma   . Diabetes mellitus without complication ([place])   . Dyslipidemia, goal to be determined    High triglycerides and LDL  . Fatty liver 05/16/2018   By CT scan  . Fibromyalgia   . Hyperlipidemia   . Hypertension   . Major depression, recurrent, chronic ([place]) 04/25/2018   Dr. Toy Care  . Obesity due to excess calories without serious comorbidity   . Osteoarthritis     Current Outpatient Medications on File Prior to Visit  Medication Sig Dispense Refill  . Blood Glucose Monitoring Suppl (ONE TOUCH ULTRA 2) w/Device KIT 1 kit by Does not apply route 2 (two) times daily. 1 each 0  . diclofenac sodium (VOLTAREN) 1 % GEL APP 0.5 TO 1 GRAM EXT AA QID PRN    . glucose blood (ONE TOUCH ULTRA TEST) test strip for testing twice daily 100 each 1  . glucose blood (ONE TOUCH ULTRA TEST) test strip TEST BID 200 each 2  . Icosapent Ethyl (VASCEPA) 0.5 g CAPS Take 0.5 mg by mouth at bedtime. 30 capsule 11  . sitaGLIPtin (JANUVIA) 50 MG tablet Take 1 tablet (50 mg total) by mouth daily. 90 tablet 3  . SYNJARDY XR 25-1000 MG TB24 TAKE 1 TABLET BY MOUTH DAILY 30 tablet 0  . traMADol (ULTRAM) 50 MG tablet TAKE 1 TABLET BY MOUTH EVERY 6 HOURS AS NEEDED FOR MODERATE PAIN 30 tablet 5  . TRULICITY 1.5 XJ/8.8TG SOPN INJECT 1.5 MG(0.5MLS) UNDER THE SKIN ONCE A WEEK 2 mL 3  .  zolpidem (AMBIEN) 10 MG tablet TAKE 1 TABLET(10 MG) BY MOUTH AT BEDTIME AS NEEDED FOR SLEEP 30 tablet 3  . [DISCONTINUED] diclofenac (VOLTAREN) 75 MG EC tablet TAKE 1 TABLET BY MOUTH TWICE DAILY 60 tablet 5   No current facility-administered medications on file prior to visit.     Allergies  Allergen Reactions  . Atorvastatin   . Cholestyramine     Gi upset  . Crestor [Rosuvastatin Calcium]   . Fenofibrate Other (See Comments)    STOMACH CRAMPS,AND DIARRHEA  . Simvastatin   . Soy Allergy   . Statins     GI upset; myalgias  . Codeine Palpitations    All codeine  .  Meloxicam Rash    Family History  Problem Relation Age of Onset  . COPD Mother        Died at 55  . Heart failure Mother        Not sure of details  . Esophageal cancer Father        Survived cancer treatment and surgery  . Drug abuse Father   . Cirrhosis Father        NAFLD  . Healthy Sister   . Diabetes Brother   . Depression Brother   . Heart attack Brother   . Heart disease Brother   . Diabetes Maternal Grandmother   . Arthritis Maternal Grandmother   . Cancer Paternal Grandmother   . Hearing loss Paternal Grandmother   . Liver disease Paternal Grandfather   . Diabetes Maternal Grandfather     Social History   Socioeconomic History  . Marital status: Married    Spouse name: Not on file  . Number of children: 1  . Years of education: Not on file  . Highest education level: Not on file  Occupational History  . Occupation: disabled due to fibromyalgia and depression  Social Needs  . Financial resource strain: Not on file  . Food insecurity    Worry: Not on file    Inability: Not on file  . Transportation needs    Medical: Not on file    Non-medical: Not on file  Tobacco Use  . Smoking status: Never Smoker  . Smokeless tobacco: Never Used  Substance and Sexual Activity  . Alcohol use: No  . Drug use: No  . Sexual activity: Yes  Lifestyle  . Physical activity    Days per week: Not on file    Minutes per session: Not on file  . Stress: Not on file  Relationships  . Social Herbalist on phone: Not on file    Gets together: Not on file    Attends religious service: Not on file    Active member of club or organization: Not on file    Attends meetings of clubs or organizations: Not on file    Relationship status: Not on file  Other Topics Concern  . Not on file  Social History Narrative  . Not on file   Review of Systems - See HPI.  All other ROS are negative.  BP 120/82   Pulse 81   Temp 98.5 F (36.9 C) (Temporal)   Resp 16   Ht 5' 2"  (1.575 m)   Wt 182 lb (82.6 kg)   SpO2 98%   BMI 33.29 kg/m   Physical Exam Vitals signs reviewed.  Constitutional:      Appearance: Normal appearance.  HENT:     Head: Normocephalic and atraumatic.  Right Ear: Tympanic membrane normal.     Left Ear: Tympanic membrane normal.     Mouth/Throat:     Mouth: Mucous membranes are moist.  Eyes:     Conjunctiva/sclera: Conjunctivae normal.     Pupils: Pupils are equal, round, and reactive to light.  Neck:     Musculoskeletal: Neck supple.  Cardiovascular:     Rate and Rhythm: Normal rate and regular rhythm.     Pulses: Normal pulses.     Heart sounds: Normal heart sounds.  Pulmonary:     Effort: Pulmonary effort is normal.     Breath sounds: Normal breath sounds.  Abdominal:     General: Abdomen is flat. Bowel sounds are normal. There is no distension.     Palpations: Abdomen is soft. There is no mass.     Tenderness: There is no abdominal tenderness.     Hernia: No hernia is present.  Neurological:     General: No focal deficit present.     Mental Status: She is alert and oriented to person, place, and time.  Psychiatric:        Mood and Affect: Mood normal.    Diabetic Foot Form - Detailed   Diabetic Foot Exam - detailed Diabetic Foot exam was performed with the following findings: Yes 04/26/2019 11:09 AM  Visual Foot Exam completed.: Yes  Can the patient see the bottom of their feet?: Yes Are the shoes appropriate in style and fit?: Yes Is there swelling or and abnormal foot shape?: No Is there a claw toe deformity?: No Is there elevated skin temparature?: No Is there foot or ankle muscle weakness?: No Normal Range of Motion: Yes Pulse Foot Exam completed.: Yes  Right posterior Tibialias: Present Left posterior Tibialias: Present  Right Dorsalis Pedis: Present Left Dorsalis Pedis: Present  Sensory Foot Exam Completed.: Yes Semmes-Weinstein Monofilament Test R Site 1-Great Toe: Pos L Site 1-Great Toe: Pos         Assessment/Plan: Essential hypertension BP normotensive. Asymptomatic. Continue diet and exercise. Will monitor.  Osteoarthritis, multiple sites Navis-standing history. Controlled with stretches, Voltaren gel and occasional Tramadol when needed. Continue current regimen.   Gastroesophageal reflux disease without esophagitis No current alarm signs or symptoms thankfully but she has having persistent symptoms.  Dietary recommendations reviewed.  Handout given.  Will start trial of pantoprazole once daily.  Follow-up with gastroenterology as scheduled.  We will check an H. pylori test today along with her other lab work.  Type 2 diabetes mellitus without complications (HCC) Previously well controlled.  Foot examination updated today and within normal limits.  Eye examination up-to-date.  Immunizations are up-to-date.  We will repeat labs today to assess current control.  She is on combination of medications including both a DPP 4 inhibitor and a GLP-1 receptor antagonist which typically do not offer added benefit by utilizing both together. Discussed we will likely remove Januvia but will review labs first.   Mixed hyperlipidemia Off of medication for 4 weeks due to increasing GERD symptoms with Vascepa.  Has been intolerant to statins before.  Will recheck fasting lipid panel to assess current state.    B12 deficiency We will check B12 level today.  Major depression, recurrent, chronic ([facility]) Previously followed by Dr. Toy Care.  No current treatment.  Having some ongoing symptoms.  Suspect her chronic fatigue is mainly stemming from depression but we are checking a lab panel today.  Discussed with her if this is unremarkable we really should consider restarting medication  for this.  Counseling recommended.  Handout given to patient.  Chronic fatigue Suspect this is related to her depression but this could very well be multifactorial.  We will check full lab panel today to include CBC, TSH,  vitamin D, Q76 and metabolic panel.    [name], PA-C

## 2019-04-26 NOTE — Assessment & Plan Note (Signed)
Previously well controlled.  Foot examination updated today and within normal limits.  Eye examination up-to-date.  Immunizations are up-to-date.  We will repeat labs today to assess current control.  She is on combination of medications including both a DPP 4 inhibitor and a GLP-1 receptor antagonist which typically do not offer added benefit by utilizing both together. Discussed we will likely remove Januvia but will review labs first.

## 2019-04-26 NOTE — Assessment & Plan Note (Signed)
No current alarm signs or symptoms thankfully but she has having persistent symptoms.  Dietary recommendations reviewed.  Handout given.  Will start trial of pantoprazole once daily.  Follow-up with gastroenterology as scheduled.  We will check an H. pylori test today along with her other lab work.

## 2019-04-26 NOTE — Assessment & Plan Note (Signed)
We will check B12 level today 

## 2019-05-01 ENCOUNTER — Telehealth: Payer: Self-pay | Admitting: Physician Assistant

## 2019-05-01 NOTE — Telephone Encounter (Signed)
Pt called this morning 7:41 am and would like call back about her lab results. Mychart server is down and pt unable to log into mychart. Please call back.

## 2019-05-01 NOTE — Telephone Encounter (Signed)
Please advise on lab results.

## 2019-05-01 NOTE — Telephone Encounter (Signed)
Called patient and advised of lab results. See result note

## 2019-05-03 ENCOUNTER — Telehealth: Payer: Self-pay | Admitting: Emergency Medicine

## 2019-05-03 ENCOUNTER — Other Ambulatory Visit: Payer: Self-pay | Admitting: Emergency Medicine

## 2019-05-03 DIAGNOSIS — F339 Major depressive disorder, recurrent, unspecified: Secondary | ICD-10-CM

## 2019-05-03 MED ORDER — FLUOXETINE HCL 20 MG PO TABS: 20.0000 mg | ORAL_TABLET | Freq: Every day | ORAL | 1 refills | Status: DC

## 2019-05-03 NOTE — Telephone Encounter (Signed)
In regards to patient concern about medication -- Fluoxetine is one of the most activating SSRI medication, meaning it is most likely to cause her to feel actually less fatigued and more energy. This is why the medication is taken first thing in the morning usually so people get the activating benefits of it throughout the day. The class of medication chosen, SSRI is the class of medications for anxiety/depression that have the lowest side effect profile -- compared to SNRI, TCA, Mood stabilizers, etc -- which is another reason this was chosen.       Based on our discussion in office (have only seen her once) she has ongoing history of depression and has been on other medication before to try and help this. I know that the multiple stressors mentioned at her visit coupled with clear signs/symptoms of depression -- down mood, fatigue, anhedonia, etc that this is at least partly the cause of her fatigue. Her lab workup so far is unremarkable for anything contributory to the fatigue, with the exception of elevated RBC for which we recommended Hematology consult to be on the safe side, but she declined this. There are other things that can certainly be checked to see if the are contributory to her level of fatigue but the mood is definitely a contributor and I do recommend treatment for this due to how much it is impacting the quality of her life.

## 2019-05-03 NOTE — Telephone Encounter (Signed)
After speaking to patient about stating the Prozac 20 mg medication. She looked up the medication and the first side effect listed was fatigue and drowsiness. She wants to know why this medication was a choice if a side effect is fatigue and drowsiness in which we are trying to fix.  She did become tearful/crying on the phone.  She states" she felt like she was being pushed off" She wanted to talk directly to PCP. She states "she feels like she is missed diagnosed". She called about counseling before and due to having difficulties with the video technology was a disaster and she didn't want to do that again.    Please advise

## 2019-05-04 NOTE — Telephone Encounter (Signed)
Advised patient of medication benefits. She declines to take medication Patient states she has started monitoring her blood sugars in the morning FBS 120 When she would have severe fatigued, unable to drive a vehicle around 3-4 pm. Her BS was 172 after she ate at 3pm By 7-7:30pm she started feeling better again but not as fatigued.  This morning FBS 120.  She states she was going to continue this over the weekend to find a pattern.

## 2019-05-04 NOTE — Telephone Encounter (Signed)
Those are normal range blood sugars for someone with diabetes. Goal for fasting glucose typically 90-110 and for non-fasting glucose in < 180. I am fine with her trying to keep track or a journal of symptoms/timing so we can see if there is truly a pattern for her.

## 2019-05-04 NOTE — Telephone Encounter (Signed)
Advised patient of PCP recommendations. Advised to monitor symptoms and sugar levels and let us know the correlation and will advise from there. She is agreeable.

## 2019-05-10 ENCOUNTER — Other Ambulatory Visit: Payer: Self-pay

## 2019-05-10 DIAGNOSIS — Z20822 Contact with and (suspected) exposure to covid-19: Secondary | ICD-10-CM

## 2019-05-12 ENCOUNTER — Telehealth: Payer: Self-pay

## 2019-05-12 LAB — NOVEL CORONAVIRUS, NAA: SARS-CoV-2, NAA: DETECTED — AB

## 2019-05-12 NOTE — Telephone Encounter (Signed)
Called patient and she is aware that she is COVID positive. Patient stated that her symptoms have improved from 3 days ago. Patient does not have a fever or SOB. Patient stated that she does have a cough, nasal congestion and fatigue. Patient was advised to go to the ER if she develops a high fever or has trouble breathing. Patient verbalized understanding. Results were sent to provider.

## 2019-05-15 ENCOUNTER — Ambulatory Visit: Payer: Medicare Other | Admitting: Gastroenterology

## 2019-05-18 ENCOUNTER — Other Ambulatory Visit: Payer: Self-pay | Admitting: Family Medicine

## 2019-05-21 NOTE — Telephone Encounter (Signed)
[name] to address now. Thanks .

## 2019-05-22 ENCOUNTER — Encounter: Payer: Self-pay | Admitting: Physician Assistant

## 2019-05-22 ENCOUNTER — Ambulatory Visit (INDEPENDENT_AMBULATORY_CARE_PROVIDER_SITE_OTHER): Payer: Medicare Other | Admitting: Physician Assistant

## 2019-05-22 ENCOUNTER — Ambulatory Visit: Payer: Self-pay

## 2019-05-22 ENCOUNTER — Other Ambulatory Visit: Payer: Self-pay

## 2019-05-22 VITALS — Temp 98.6°F

## 2019-05-22 DIAGNOSIS — B9689 Other specified bacterial agents as the cause of diseases classified elsewhere: Secondary | ICD-10-CM | POA: Diagnosis not present

## 2019-05-22 DIAGNOSIS — J019 Acute sinusitis, unspecified: Secondary | ICD-10-CM

## 2019-05-22 DIAGNOSIS — U071 COVID-19: Secondary | ICD-10-CM | POA: Diagnosis not present

## 2019-05-22 MED ORDER — FLUCONAZOLE 150 MG PO TABS: 150.0000 mg | ORAL_TABLET | Freq: Once | ORAL | 0 refills | Status: AC

## 2019-05-22 MED ORDER — AMOXICILLIN-POT CLAVULANATE 875-125 MG PO TABS: 1.0000 | ORAL_TABLET | Freq: Two times a day (BID) | ORAL | 0 refills | Status: DC

## 2019-05-22 NOTE — Patient Instructions (Signed)
Instructions sent to MyChart.    Please take antibiotic as directed.  Increase fluid intake.  Use Saline nasal spray.  Take a daily multivitamin. Use the Diflucan as directed if needed.  Place a humidifier in the bedroom.  Please call or return clinic if symptoms are not improving.  Sinusitis Sinusitis is redness, soreness, and swelling (inflammation) of the paranasal sinuses. Paranasal sinuses are air pockets within the bones of your face (beneath the eyes, the middle of the forehead, or above the eyes). In healthy paranasal sinuses, mucus is able to drain out, and air is able to circulate through them by way of your nose. However, when your paranasal sinuses are inflamed, mucus and air can become trapped. This can allow bacteria and other germs to grow and cause infection. Sinusitis can develop quickly and last only a short time (acute) or continue over a Muradyan period (chronic). Sinusitis that lasts for more than 12 weeks is considered chronic.  CAUSES  Causes of sinusitis include:  Allergies.  Structural abnormalities, such as displacement of the cartilage that separates your nostrils (deviated septum), which can decrease the air flow through your nose and sinuses and affect sinus drainage.  Functional abnormalities, such as when the small hairs (cilia) that line your sinuses and help remove mucus do not work properly or are not present. SYMPTOMS  Symptoms of acute and chronic sinusitis are the same. The primary symptoms are pain and pressure around the affected sinuses. Other symptoms include:  Upper toothache.  Earache.  Headache.  Bad breath.  Decreased sense of smell and taste.  A cough, which worsens when you are lying flat.  Fatigue.  Fever.  Thick drainage from your nose, which often is green and may contain pus (purulent).  Swelling and warmth over the affected sinuses. DIAGNOSIS  Your caregiver will perform a physical exam. During the exam, your caregiver may:   Look in your nose for signs of abnormal growths in your nostrils (nasal polyps).  Tap over the affected sinus to check for signs of infection.  View the inside of your sinuses (endoscopy) with a special imaging device with a light attached (endoscope), which is inserted into your sinuses. If your caregiver suspects that you have chronic sinusitis, one or more of the following tests may be recommended:  Allergy tests.  Nasal culture A sample of mucus is taken from your nose and sent to a lab and screened for bacteria.  Nasal cytology A sample of mucus is taken from your nose and examined by your caregiver to determine if your sinusitis is related to an allergy. TREATMENT  Most cases of acute sinusitis are related to a viral infection and will resolve on their own within 10 days. Sometimes medicines are prescribed to help relieve symptoms (pain medicine, decongestants, nasal steroid sprays, or saline sprays).  However, for sinusitis related to a bacterial infection, your caregiver will prescribe antibiotic medicines. These are medicines that will help kill the bacteria causing the infection.  Rarely, sinusitis is caused by a fungal infection. In theses cases, your caregiver will prescribe antifungal medicine. For some cases of chronic sinusitis, surgery is needed. Generally, these are cases in which sinusitis recurs more than 3 times per year, despite other treatments. HOME CARE INSTRUCTIONS   Drink plenty of water. Water helps thin the mucus so your sinuses can drain more easily.  Use a humidifier.  Inhale steam 3 to 4 times a day (for example, sit in the bathroom with the shower running).  Apply a warm, moist washcloth to your face 3 to 4 times a day, or as directed by your caregiver.  Use saline nasal sprays to help moisten and clean your sinuses.  Take over-the-counter or prescription medicines for pain, discomfort, or fever only as directed by your caregiver. SEEK IMMEDIATE MEDICAL  CARE IF:  You have increasing pain or severe headaches.  You have nausea, vomiting, or drowsiness.  You have swelling around your face.  You have vision problems.  You have a stiff neck.  You have difficulty breathing. MAKE SURE YOU:   Understand these instructions.  Will watch your condition.  Will get help right away if you are not doing well or get worse. Document Released: 06/28/2005 Document Revised: 09/20/2011 Document Reviewed: 07/13/2011 [facility] Patient Information 2014 [place], [place].

## 2019-05-22 NOTE — Telephone Encounter (Signed)
Patient scheduled for VV this afternoon.

## 2019-05-22 NOTE — Telephone Encounter (Signed)
Patient called and she states that she has been Dx with COVID-19 15 days ago.  She states that her symptoms have changes and she now has dry cough. Sinus pressure and low grade fever. She states her breathing is fine and she has no chest pain or tightness. She feels that she may have developed a sinus infection. Care advice read to patient. She verbalized understanding.  Call transferred to office for scheduling.  Reason for Disposition . [1] Continuous (nonstop) coughing interferes with work or school AND [2] no improvement using cough treatment per protocol  Answer Assessment - Initial Assessment Questions 1. COVID-19 DIAGNOSIS: "Who made your Coronavirus (COVID-19) diagnosis?" "Was it confirmed by a positive lab test?" If not diagnosed by a HCP, ask "Are there lots of cases (community spread) where you live?" (See public health department website, if unsure)     15th day 2. ONSET: "When did the COVID-19 symptoms start?"      15 days ago 3. WORST SYMPTOM: "What is your worst symptom?" (e.g., cough, fever, shortness of breath, muscle aches)    Low grade temp, sinus pressure 4. COUGH: "Do you have a cough?" If so, ask: "How bad is the cough?"       cough 5. FEVER: "Do you have a fever?" If so, ask: "What is your temperature, how was it measured, and when did it start?"     99 6. RESPIRATORY STATUS: "Describe your breathing?" (e.g., shortness of breath, wheezing, unable to speak)     Able to breath  7. BETTER-SAME-WORSE: "Are you getting better, staying the same or getting worse compared to yesterday?"  If getting worse, ask, "In what way?"    worse 8. HIGH RISK DISEASE: "Do you have any chronic medical problems?" (e.g., asthma, heart or lung disease, weak immune system, etc.)   no 9. PREGNANCY: "Is there any chance you are pregnant?" "When was your last menstrual period?"    N/A 10. OTHER SYMPTOMS: "Do you have any other symptoms?"  (e.g., chills, fatigue, headache, loss of smell or taste,  muscle pain, sore throat)      Fatigue, cold symptoms  Protocols used: CORONAVIRUS (COVID-19) DIAGNOSED OR SUSPECTED-A-AH

## 2019-05-22 NOTE — Progress Notes (Signed)
Virtual Visit via Video   I connected with patient on 05/22/19 at  2:30 PM EST by a video enabled telemedicine application and verified that I am speaking with the correct person using two identifiers.  Location patient: Home Location provider: [facility], Office Persons participating in the virtual visit: Patient, Provider, [place] ([name])  I discussed the limitations of evaluation and management by telemedicine and the availability of in person appointments. The patient expressed understanding and agreed to proceed.  Subjective:   HPI:   Patient presents via Video Visit c/o URI-like symptoms. Patient tested positive for COVID 15 days ago, cough has worsened since Friday. Also notes fatigue, congestion and sinus pressure around left eye, and bilateral maxillary sinuses. Sinuses are painful to touch. Some mild dizziness with COVID symptoms.  Endorses low-grade fever, Tmax 29F yesterday. Has history of acute sinusitis, states this feels similar. Also notes inside of her mouth has been sore along posterior palate. Has been taking Tylenol and Mucinex with mild relief. Denies productive cough, sore throat, shortness of breath, difficulty breathing, headache.    ROS:   See pertinent positives and negatives per HPI.  Patient Active Problem List   Diagnosis Date Noted  . Chronic fatigue 04/26/2019  . Gastroesophageal reflux disease without esophagitis 04/26/2019  . Statin intolerance 06/21/2018  . Seasonal allergic rhinitis due to pollen 05/19/2018  . Fatty liver 05/16/2018  . Fibromyalgia 04/25/2018  . Major depression, recurrent, chronic ([place]) 04/25/2018  . Osteoarthritis, multiple sites 04/25/2018  . Essential hypertension 11/09/2016  . Type 2 diabetes mellitus without complications ([place]) 38/32/9191  . 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  . Mixed hyperlipidemia 11/04/2016  . B12 deficiency 01/23/2015    Social History   Tobacco Use  . Smoking status: Never  Smoker  . Smokeless tobacco: Never Used  Substance Use Topics  . Alcohol use: No    Current Outpatient Medications:  .  Blood Glucose Monitoring Suppl (ONE TOUCH ULTRA 2) w/Device KIT, 1 kit by Does not apply route 2 (two) times daily., Disp: 1 each, Rfl: 0 .  diclofenac sodium (VOLTAREN) 1 % GEL, APP 0.5 TO 1 GRAM EXT AA QID PRN, Disp: , Rfl:  .  FLUoxetine (PROZAC) 20 MG tablet, Take 1 tablet (20 mg total) by mouth daily., Disp: 30 tablet, Rfl: 1 .  glucose blood (ONE TOUCH ULTRA TEST) test strip, for testing twice daily, Disp: 100 each, Rfl: 1 .  glucose blood (ONE TOUCH ULTRA TEST) test strip, TEST BID, Disp: 200 each, Rfl: 2 .  Icosapent Ethyl (VASCEPA) 0.5 g CAPS, Take 0.5 mg by mouth at bedtime., Disp: 30 capsule, Rfl: 11 .  pantoprazole (PROTONIX) 40 MG tablet, Take 1 tablet (40 mg total) by mouth daily., Disp: 30 tablet, Rfl: 3 .  sitaGLIPtin (JANUVIA) 50 MG tablet, Take 1 tablet (50 mg total) by mouth daily., Disp: 90 tablet, Rfl: 3 .  SYNJARDY XR 25-1000 MG TB24, TAKE 1 TABLET BY MOUTH DAILY, Disp: 30 tablet, Rfl: 0 .  traMADol (ULTRAM) 50 MG tablet, TAKE 1 TABLET BY MOUTH EVERY 6 HOURS AS NEEDED FOR MODERATE PAIN, Disp: 30 tablet, Rfl: 0 .  TRULICITY 1.54 YO/0.54YO SOPN, INJECT 1.5 MG(0.5MLS) UNDER THE SKIN ONCE A WEEK, Disp: 2 mL, Rfl: 3 .  zolpidem (AMBIEN) 10 MG tablet, TAKE 1 TABLET(10 MG) BY MOUTH AT BEDTIME AS NEEDED FOR SLEEP, Disp: 30 tablet, Rfl: 3 .  amoxicillin-clavulanate (AUGMENTIN) 875-125 MG tablet, Take 1 tablet by mouth 2 (two) times daily., Disp: 14 tablet, Rfl:  0 .  fluconazole (DIFLUCAN) 150 MG tablet, Take 1 tablet (150 mg total) by mouth once for 1 dose. May repeat in 3 days., Disp: 2 tablet, Rfl: 0  Allergies  Allergen Reactions  . Atorvastatin   . Cholestyramine     Gi upset  . Crestor [Rosuvastatin Calcium]   . Fenofibrate Other (See Comments)    STOMACH CRAMPS,AND DIARRHEA  . Simvastatin   . Soy Allergy   . Statins     GI upset; myalgias  .  Codeine Palpitations    All codeine  . Meloxicam Rash    Objective:   Temp 98.6 F (37 C) (Oral)   Patient is well-developed, well-nourished in no acute distress.  Resting comfortably at home.  Head is normocephalic, atraumatic.  No labored breathing.  Speech is clear and coherent with logical content.  Patient is alert and oriented at baseline.   Assessment and Plan:   1. Acute bacterial sinusitis Rx Augmentin.  Increase fluids.  Rest.  Saline nasal spray.  Probiotic.  Mucinex as directed.  Humidifier in bedroom. Rx Diflucan sent in in case of yeast (patient with history of this). She has been instructed to start a daily probiotic.  Call or return to clinic if symptoms are not improving.  - amoxicillin-clavulanate (AUGMENTIN) 875-125 MG tablet; Take 1 tablet by mouth 2 (two) times daily.  Dispense: 14 tablet; Refill: 0    Megan Murray, [place] 05/22/2019

## 2019-05-22 NOTE — Progress Notes (Signed)
I have discussed the procedure for the virtual visit with the patient who has given consent to proceed with assessment and treatment.   Bryahna Lesko S Laini Urick, CMA     

## 2019-05-31 ENCOUNTER — Ambulatory Visit: Payer: Medicare Other | Admitting: Physician Assistant

## 2019-06-04 ENCOUNTER — Other Ambulatory Visit: Payer: Self-pay | Admitting: *Deleted

## 2019-06-04 MED ORDER — SYNJARDY XR 25-1000 MG PO TB24: 1.0000 | ORAL_TABLET | Freq: Every day | ORAL | 1 refills | Status: DC

## 2019-06-12 LAB — HM DIABETES EYE EXAM

## 2019-06-13 ENCOUNTER — Encounter: Payer: Self-pay | Admitting: Physician Assistant

## 2019-06-13 ENCOUNTER — Other Ambulatory Visit: Payer: Self-pay

## 2019-06-13 ENCOUNTER — Ambulatory Visit (INDEPENDENT_AMBULATORY_CARE_PROVIDER_SITE_OTHER): Payer: Medicare Other | Admitting: Physician Assistant

## 2019-06-13 VITALS — Temp 98.5°F

## 2019-06-13 DIAGNOSIS — R058 Other specified cough: Secondary | ICD-10-CM

## 2019-06-13 DIAGNOSIS — J9801 Acute bronchospasm: Secondary | ICD-10-CM | POA: Diagnosis not present

## 2019-06-13 DIAGNOSIS — U071 COVID-19: Secondary | ICD-10-CM | POA: Diagnosis not present

## 2019-06-13 DIAGNOSIS — R05 Cough: Secondary | ICD-10-CM

## 2019-06-13 MED ORDER — PROMETHAZINE-DM 6.25-15 MG/5ML PO SYRP: 5.0000 mL | ORAL_SOLUTION | Freq: Four times a day (QID) | ORAL | 0 refills | Status: DC | PRN

## 2019-06-13 MED ORDER — ALBUTEROL SULFATE HFA 108 (90 BASE) MCG/ACT IN AERS: 2.0000 | INHALATION_SPRAY | Freq: Four times a day (QID) | RESPIRATORY_TRACT | 2 refills | Status: DC | PRN

## 2019-06-13 NOTE — Telephone Encounter (Signed)
Pt called in asking for the cough medication and the inhaler. Pt uses [facility] in [place]. She states that she saw Einar Pheasant this morning for a VV. Please advise

## 2019-06-13 NOTE — Patient Instructions (Addendum)
-  Instructions sent to MyChart.  -Please keep hydrated and get plenty of rest. -Start the albuterol as directed when needed to help with bronchospasm and any chest tightness. Do not take more than as directed -Start the cough syrup to help calm this down. This should also help with irritation int he muscles between ribs -Tylenol if needed for tenderness -Start OTC non-drowsy antihistamine like Claritin or Xyzal. Also start nasal steroid like Nasacort. This will help with nasal inflammation and to help reduce any fluid behind the ear drum that may be making you feel dizzy/off-balance.  Let me know if things are not improving dramatically in the next 5 days or so.

## 2019-06-13 NOTE — Progress Notes (Signed)
Virtual Visit via Video   I connected with patient on 06/13/19 at 10:30 AM EST by a video enabled telemedicine application and verified that I am speaking with the correct person using two identifiers.  Location patient: Home Location provider: [facility], Office Persons participating in the virtual visit: Patient, Provider, PA-Student Drucilla Schmidt), CMA (Eduard Clos)  I discussed the limitations of evaluation and management by telemedicine and the availability of in person appointments. The patient expressed understanding and agreed to proceed.  Subjective:   HPI:   Patient presents via Doxy.Me for c/o cough. Endorses ongoing dry cough associated with fatigue. Notes intermittent dizziness 1-2x/day with movements such as bending over. Has been having coughing fits with difficulty catching her breath. Notes some chest heaviness around the area of her sternum and around her ribs bilaterally. She is easily winded and tired with minimal activity. Has been taking Mucinex which she believes helps keep the cough under control. Denies fever, chills, sputum production, sinus pressure, nasal congestion, SOB, wheezing.   ROS:   See pertinent positives and negatives per HPI.  Patient Active Problem List   Diagnosis Date Noted  . Chronic fatigue 04/26/2019  . Gastroesophageal reflux disease without esophagitis 04/26/2019  . Statin intolerance 06/21/2018  . Seasonal allergic rhinitis due to pollen 05/19/2018  . Fatty liver 05/16/2018  . Fibromyalgia 04/25/2018  . Major depression, recurrent, chronic ([place]) 04/25/2018  . Osteoarthritis, multiple sites 04/25/2018  . Essential hypertension 11/09/2016  . Type 2 diabetes mellitus without complications ([place]) 54/03/8118  . 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  . Mixed hyperlipidemia 11/04/2016  . B12 deficiency 01/23/2015    Social History   Tobacco Use  . Smoking status: Never Smoker  . Smokeless tobacco: Never Used   Substance Use Topics  . Alcohol use: No    Current Outpatient Medications:  .  Blood Glucose Monitoring Suppl (ONE TOUCH ULTRA 2) w/Device KIT, 1 kit by Does not apply route 2 (two) times daily., Disp: 1 each, Rfl: 0 .  diclofenac sodium (VOLTAREN) 1 % GEL, APP 0.5 TO 1 GRAM EXT AA QID PRN, Disp: , Rfl:  .  Empagliflozin-metFORMIN HCl ER (SYNJARDY XR) 25-1000 MG TB24, Take 1 tablet by mouth daily., Disp: 30 tablet, Rfl: 1 .  FLUoxetine (PROZAC) 20 MG tablet, Take 1 tablet (20 mg total) by mouth daily., Disp: 30 tablet, Rfl: 1 .  glucose blood (ONE TOUCH ULTRA TEST) test strip, for testing twice daily, Disp: 100 each, Rfl: 1 .  glucose blood (ONE TOUCH ULTRA TEST) test strip, TEST BID, Disp: 200 each, Rfl: 2 .  Icosapent Ethyl (VASCEPA) 0.5 g CAPS, Take 0.5 mg by mouth at bedtime., Disp: 30 capsule, Rfl: 11 .  pantoprazole (PROTONIX) 40 MG tablet, Take 1 tablet (40 mg total) by mouth daily., Disp: 30 tablet, Rfl: 3 .  sitaGLIPtin (JANUVIA) 50 MG tablet, Take 1 tablet (50 mg total) by mouth daily., Disp: 90 tablet, Rfl: 3 .  traMADol (ULTRAM) 50 MG tablet, TAKE 1 TABLET BY MOUTH EVERY 6 HOURS AS NEEDED FOR MODERATE PAIN, Disp: 30 tablet, Rfl: 0 .  TRULICITY 1.5 JY/7.8GN SOPN, INJECT 1.5 MG(0.5MLS) UNDER THE SKIN ONCE A WEEK, Disp: 2 mL, Rfl: 3 .  zolpidem (AMBIEN) 10 MG tablet, TAKE 1 TABLET(10 MG) BY MOUTH AT BEDTIME AS NEEDED FOR SLEEP, Disp: 30 tablet, Rfl: 3  Allergies  Allergen Reactions  . Atorvastatin   . Cholestyramine     Gi upset  . Crestor [Rosuvastatin Calcium]   . Fenofibrate Other (  See Comments)    STOMACH CRAMPS,AND DIARRHEA  . Simvastatin   . Soy Allergy   . Statins     GI upset; myalgias  . Codeine Palpitations    All codeine  . Meloxicam Rash    Objective:   Temp 98.5 F (36.9 C) (Temporal)   Patient is well-developed, well-nourished in no acute distress.  Resting comfortably at home.  Head is normocephalic, atraumatic.  No labored breathing.  Speech is  clear and coherent with logical content.  Patient is alert and oriented at baseline.   Assessment and Plan:   1. Post-viral cough syndrome 2. Bronchospasm Post-COVID. No residual SOB. Fatigue improved from acute COVID but still having ongoing easy fatigability. Stable on video today. Start Albuterol for tightness and bronchospasm. Start antihistamine to help with any nasal inflammation and Flonase OTC -- suspect mild ETD causing mild positional dizziness. Rx promethazine-dm for cough. Follow-up via MyChart if symptoms are not improving in the next 5-7 days. Call ASAP for any new or worsening symptoms.     [name], PA-C 06/13/2019

## 2019-06-13 NOTE — Progress Notes (Signed)
I have discussed the procedure for the virtual visit with the patient who has given consent to proceed with assessment and treatment.   Renaud Celli S Zaydenn Balaguer, CMA     

## 2019-06-13 NOTE — Addendum Note (Signed)
Addended by: Brunetta Jeans on: 06/13/2019 03:12 PM   Modules accepted: Orders

## 2019-06-15 ENCOUNTER — Other Ambulatory Visit: Payer: Self-pay | Admitting: Physician Assistant

## 2019-06-15 MED ORDER — TRULICITY 1.5 MG/0.5ML ~~LOC~~ SOAJ: SUBCUTANEOUS | 3 refills | Status: DC

## 2019-06-19 ENCOUNTER — Other Ambulatory Visit: Payer: Self-pay | Admitting: Emergency Medicine

## 2019-06-19 DIAGNOSIS — R05 Cough: Secondary | ICD-10-CM

## 2019-06-19 DIAGNOSIS — R058 Other specified cough: Secondary | ICD-10-CM

## 2019-06-19 NOTE — Telephone Encounter (Signed)
Pt called back to follow up on message sent yesterday. Pt states she's still not feeling any better. Please advise and Thank you!  Call pt @ [phone].

## 2019-06-19 NOTE — Telephone Encounter (Signed)
Please call patient to make sure nothing is worsening. At this point, want to set her up with Pulmonology for ongoing symptoms s/p COVID infection

## 2019-06-19 NOTE — Telephone Encounter (Signed)
Please continue a pulmonology referral.  Continue supportive measures discussed at last visit.  If the cough syrup makes her sleepy, she can continue to use at night.  Okay to send in Rx for Tessalon 100 mg capsules.  Take 1 capsule up to 3 times daily as needed for cough.

## 2019-06-19 NOTE — Telephone Encounter (Signed)
Spoke with patient and she is still having the cough-dry. The cough syrup helps but puts her to sleep. She does become wheezing after a coughing spell. She is using the medications recommended Claritin, Albuterol and cough syrup. She can't do the Nasocort. She is agreeable with referral to Pulmonology.

## 2019-06-20 ENCOUNTER — Other Ambulatory Visit: Payer: Self-pay | Admitting: Physician Assistant

## 2019-06-20 DIAGNOSIS — M159 Polyosteoarthritis, unspecified: Secondary | ICD-10-CM

## 2019-06-20 DIAGNOSIS — M797 Fibromyalgia: Secondary | ICD-10-CM

## 2019-06-20 DIAGNOSIS — R058 Other specified cough: Secondary | ICD-10-CM

## 2019-06-20 DIAGNOSIS — R05 Cough: Secondary | ICD-10-CM

## 2019-06-20 DIAGNOSIS — J9801 Acute bronchospasm: Secondary | ICD-10-CM

## 2019-06-20 MED ORDER — BENZONATATE 100 MG PO CAPS: 100.0000 mg | ORAL_CAPSULE | Freq: Three times a day (TID) | ORAL | 0 refills | Status: DC | PRN

## 2019-06-20 MED ORDER — TRAMADOL HCL 50 MG PO TABS: ORAL_TABLET | ORAL | 0 refills | Status: DC

## 2019-06-20 MED ORDER — PROMETHAZINE-DM 6.25-15 MG/5ML PO SYRP: 5.0000 mL | ORAL_SOLUTION | Freq: Every day | ORAL | 0 refills | Status: DC

## 2019-06-20 NOTE — Telephone Encounter (Signed)
Rx sent to pharmacy   

## 2019-06-20 NOTE — Telephone Encounter (Signed)
Pt called in asking for refills on her Tramadol and the promethazine DM

## 2019-06-20 NOTE — Telephone Encounter (Signed)
Indication for chronic opioid: OA multiple sites, Fibromyalgia Medication and dose: Tramadol 50 mg # pills per month: 30 on 05/21/19 Last UDS date: None Opioid Treatment Agreement signed (Y/N): Yes 06/21/18 Opioid Treatment Agreement last reviewed with patient:   [facility] reviewed this encounter (include red flags):

## 2019-06-27 ENCOUNTER — Institutional Professional Consult (permissible substitution): Payer: Medicare Other | Admitting: Pulmonary Disease

## 2019-06-28 ENCOUNTER — Ambulatory Visit: Payer: Medicare Other | Admitting: Pulmonary Disease

## 2019-06-28 ENCOUNTER — Other Ambulatory Visit: Payer: Self-pay

## 2019-06-28 ENCOUNTER — Other Ambulatory Visit: Payer: Self-pay | Admitting: Physician Assistant

## 2019-06-28 ENCOUNTER — Encounter: Payer: Self-pay | Admitting: Pulmonary Disease

## 2019-06-28 DIAGNOSIS — J9801 Acute bronchospasm: Secondary | ICD-10-CM | POA: Diagnosis not present

## 2019-06-28 DIAGNOSIS — R05 Cough: Secondary | ICD-10-CM

## 2019-06-28 DIAGNOSIS — F339 Major depressive disorder, recurrent, unspecified: Secondary | ICD-10-CM

## 2019-06-28 DIAGNOSIS — R058 Other specified cough: Secondary | ICD-10-CM

## 2019-06-28 MED ORDER — PROMETHAZINE-DM 6.25-15 MG/5ML PO SYRP: 5.0000 mL | ORAL_SOLUTION | Freq: Every day | ORAL | 1 refills | Status: DC

## 2019-06-28 MED ORDER — BREO ELLIPTA 200-25 MCG/INH IN AEPB: 1.0000 | INHALATION_SPRAY | Freq: Every day | RESPIRATORY_TRACT | 3 refills | Status: DC

## 2019-06-28 MED ORDER — ALBUTEROL SULFATE HFA 108 (90 BASE) MCG/ACT IN AERS: 2.0000 | INHALATION_SPRAY | Freq: Four times a day (QID) | RESPIRATORY_TRACT | 2 refills | Status: DC | PRN

## 2019-06-28 MED ORDER — BREO ELLIPTA 100-25 MCG/INH IN AEPB: 1.0000 | INHALATION_SPRAY | Freq: Every day | RESPIRATORY_TRACT | 0 refills | Status: DC

## 2019-06-28 NOTE — Patient Instructions (Signed)
Persistent cough after recent Covid infection  Cough medicine Steroid inhaler Albuterol as needed  May still take a few weeks before cough completely resolves  Did send in a prescription for your cough medicine You may stop taking Tessalon Perles if it is not really helping

## 2019-06-28 NOTE — Progress Notes (Signed)
Subjective:    Patient ID: Megan Murray, female    DOB: 03/03/1966, 53 y.o.   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 Asthma   . Diabetes mellitus without complication ([place])   . Dyslipidemia, goal to be determined    High triglycerides and LDL  . Fatty liver 05/16/2018   By CT scan  . Fibromyalgia   . Hyperlipidemia   . Hypertension   . Major depression, recurrent, chronic ([place]) 04/25/2018   Dr. Toy Care  . Obesity due to excess calories without serious comorbidity   . Osteoarthritis    Social History   Socioeconomic History  . Marital status: Married    Spouse name: Not on file  . Number of children: 1  . Years of education: Not on file  . Highest education level: Not on file  Occupational History  . Occupation: disabled due to fibromyalgia and depression  Tobacco Use  . Smoking status: Never Smoker  . Smokeless tobacco: Never Used  Substance and Sexual Activity  . Alcohol use: No  . Drug use: No  . Sexual activity: Yes  Other Topics Concern  . Not on file  Social History Narrative  . Not on file   Social Determinants of Health    Financial Resource Strain:   . Difficulty of Paying Living Expenses: Not on file  Food Insecurity:   . Worried About Charity fundraiser in the Last Year: Not on file  . Ran Out of Food in the Last Year: Not on file  Transportation Needs:   . Lack of Transportation (Medical): Not on file  . Lack of Transportation (Non-Medical): Not on file  Physical Activity:   . Days of Exercise per Week: Not on file  . Minutes of Exercise per Session: Not on file  Stress:   . Feeling of Stress : Not on file  Social Connections:   . Frequency of Communication with Friends and Family: Not on file  . Frequency of Social Gatherings with Friends and Family: Not on file  . Attends Religious Services: Not on file  . Active Member of Clubs or Organizations: Not on file  . Attends Archivist Meetings: Not on file  . Marital Status: Not on file  Intimate Partner Violence:   . Fear of Current or Ex-Partner: Not on file  . Emotionally Abused: Not on file  . Physically Abused: Not on file  . Sexually Abused: Not on file   Family History  Problem Relation Age of Onset  . COPD Mother        Died at 11  . Heart failure Mother  Not sure of details  . Esophageal cancer Father        Survived cancer treatment and surgery  . Drug abuse Father   . Cirrhosis Father        NAFLD  . Healthy Sister   . Diabetes Brother   . Depression Brother   . Heart attack Brother   . Heart disease Brother   . Diabetes Maternal Grandmother   . Arthritis Maternal Grandmother   . Cancer Paternal Grandmother   . Hearing loss Paternal Grandmother   . Liver disease Paternal Grandfather   . Diabetes Maternal Grandfather       Review of Systems  Constitutional: Negative for fever and unexpected weight change.  HENT: Positive for sneezing. Negative for congestion, dental problem, ear pain, nosebleeds, postnasal drip, rhinorrhea, sinus pressure, sore throat and trouble swallowing.   Eyes: Negative for  redness and itching.  Respiratory: Positive for cough, shortness of breath and wheezing. Negative for chest tightness.   Cardiovascular: Negative for palpitations and leg swelling.  Gastrointestinal: Negative for nausea and vomiting.  Genitourinary: Negative for dysuria.  Musculoskeletal: Negative for joint swelling.  Skin: Negative for rash.  Allergic/Immunologic: Negative.  Negative for environmental allergies, food allergies and immunocompromised state.  Neurological: Positive for headaches.  Hematological: Does not bruise/bleed easily.  Psychiatric/Behavioral: Negative for dysphoric mood. The patient is nervous/anxious.        Objective:   Physical Exam Constitutional:      Appearance: Normal appearance.  HENT:     Head: Normocephalic and atraumatic.     Nose: Nose normal. No congestion.     Mouth/Throat:     Mouth: Mucous membranes are moist.  Eyes:     Pupils: Pupils are equal, round, and reactive to light.  Cardiovascular:     Rate and Rhythm: Normal rate and regular rhythm.     Pulses: Normal pulses.     Heart sounds: No murmur. No friction rub.  Pulmonary:     Effort: Pulmonary effort is normal. No respiratory distress.     Breath sounds: Normal breath sounds. No stridor. No wheezing or rhonchi.  Musculoskeletal:        General: Normal range of motion.     Cervical back: Normal range of motion. No rigidity or tenderness.  Lymphadenopathy:     Cervical: No cervical adenopathy.  Skin:    General: Skin is warm and dry.  Neurological:     General: No focal deficit present.     Mental Status: She is alert.  Psychiatric:        Mood and Affect: Mood normal.        Behavior: Behavior normal.    Vitals:   06/28/19 1134  BP: 138/86  Pulse: 96  SpO2: 97%      Assessment & Plan:  .  Post viral syndrome cough -Patient with significant symptoms limiting activities -Leaving her with significant fatigue -Appears to have a component of airway  hyperresponsiveness  With a history of previous airway hyperactivity -Recent infection may have made her airway hyperactive  Plan Inhaled steroid/LABA-Breo 200 once daily Albuterol use as needed Prescription for promethazine with dextromethorphan  Chest x-ray if no resolution of symptoms  PFT further down the line if condition continues to be protracted  A wait-and-see approach is most appropriate

## 2019-06-29 ENCOUNTER — Telehealth: Payer: Self-pay | Admitting: Pulmonary Disease

## 2019-06-29 DIAGNOSIS — R058 Other specified cough: Secondary | ICD-10-CM

## 2019-06-29 DIAGNOSIS — J9801 Acute bronchospasm: Secondary | ICD-10-CM

## 2019-06-29 DIAGNOSIS — R05 Cough: Secondary | ICD-10-CM

## 2019-06-29 MED ORDER — PROMETHAZINE-DM 6.25-15 MG/5ML PO SYRP: ORAL_SOLUTION | ORAL | 1 refills | Status: DC

## 2019-06-29 NOTE — Telephone Encounter (Signed)
This patient was seen by Dr. Ander Slade on 06/28/19. The prescription for the Promethazine-DM had "take 5 mls by mouth at bedtime, Tessalon for daytime cough"  Corrected prescription sent to the pharmacy.  I called the patient to make her aware. Nothing further needed at this time.

## 2019-07-11 ENCOUNTER — Telehealth: Payer: Self-pay | Admitting: Pulmonary Disease

## 2019-07-11 ENCOUNTER — Ambulatory Visit: Payer: Medicare Other

## 2019-07-11 ENCOUNTER — Other Ambulatory Visit: Payer: Self-pay | Admitting: Emergency Medicine

## 2019-07-11 DIAGNOSIS — M150 Primary generalized (osteo)arthritis: Secondary | ICD-10-CM

## 2019-07-11 DIAGNOSIS — R058 Other specified cough: Secondary | ICD-10-CM

## 2019-07-11 DIAGNOSIS — M159 Polyosteoarthritis, unspecified: Secondary | ICD-10-CM

## 2019-07-11 DIAGNOSIS — R05 Cough: Secondary | ICD-10-CM

## 2019-07-11 DIAGNOSIS — M797 Fibromyalgia: Secondary | ICD-10-CM

## 2019-07-11 NOTE — Telephone Encounter (Signed)
Spoke with pt. She is going to come to the office tomorrow morning and get the xray. Order has been placed. Nothing further was needed.

## 2019-07-11 NOTE — Telephone Encounter (Signed)
Yes, we can get the chest x-ray

## 2019-07-11 NOTE — Telephone Encounter (Signed)
Spoke with pt. States that she is not feeling well. Reports increased coughing, shortness of breath, increased fatigue and chest tightness. Cough is non productive at this time. Denies fever/chills, congestion/runny nose, sore throat, severe headache, joint pain, unexplained muscle aches, loss of taste or smell, rash, N/V/D, abdominal pain, redness around/in the eye or unexplained bruising or bleeding.  Pt states that Dr. Ander Slade advised her that if she did not improve, she would need a CXR. She is willing to come to the office if he wants her to have that done.  Dr. Ander Slade - please advise.  Thanks.

## 2019-07-11 NOTE — Telephone Encounter (Signed)
Last rx Tramadol 06/20/19 #30 CSC: 06/21/18 LOV: 06/13/19

## 2019-07-12 ENCOUNTER — Other Ambulatory Visit: Payer: Self-pay | Admitting: Family Medicine

## 2019-07-12 ENCOUNTER — Ambulatory Visit (INDEPENDENT_AMBULATORY_CARE_PROVIDER_SITE_OTHER): Payer: Medicare Other

## 2019-07-12 DIAGNOSIS — R05 Cough: Secondary | ICD-10-CM | POA: Diagnosis not present

## 2019-07-12 NOTE — Telephone Encounter (Signed)
Refill request

## 2019-07-16 ENCOUNTER — Telehealth: Payer: Self-pay

## 2019-07-16 NOTE — Telephone Encounter (Signed)
Patient called in stating that she had received an AVS from an appt on 12.30.20 sign by Con Memos. Patient states that she never had an appt that day, but the AVS listed all of her recent health diagnoses. She was confused because she hasn't seen [place] since the beginning of December and didn't want to get any charges. I informed patient that on her past appt desk, there was no appt listed and that she wasn't going to be charged for any appts. Informed patient that I am not sure why she received an AVS for an appt that never existed. Patient stated, "I don't know if you know this or not, but Selena Batten is my third provider this year, and I will keep changing providers because I will not put up with this." Informed patient that I would route PCP a message.

## 2019-07-16 NOTE — Telephone Encounter (Signed)
Reviewed patient's chart and MyChart messages and do not see any encounter from our office on 07/11/2019. I wonder if it is where the system changed and is now releasing old encounters for viewing on MyChart? I do see where patient saw a specialist on the day mentioned. Please reiterate to patient there will be no charge from [place] on that date. As for the comments made, I am not sure why she felt the need to share that. If she is unhappy for some reason she is welcome to find care elsewhere.

## 2019-07-20 ENCOUNTER — Other Ambulatory Visit: Payer: Self-pay | Admitting: Family Medicine

## 2019-07-20 NOTE — Telephone Encounter (Signed)
[facility] patient

## 2019-07-23 ENCOUNTER — Encounter: Payer: Self-pay | Admitting: Physician Assistant

## 2019-07-27 ENCOUNTER — Encounter: Payer: Self-pay | Admitting: Emergency Medicine

## 2019-07-27 DIAGNOSIS — M797 Fibromyalgia: Secondary | ICD-10-CM

## 2019-07-27 DIAGNOSIS — M159 Polyosteoarthritis, unspecified: Secondary | ICD-10-CM

## 2019-07-27 NOTE — Telephone Encounter (Signed)
Tramadol last rx 06/20/19 #30 Patient taking Tramadol once daily for osteoarthritis and fibromyalgia.

## 2019-07-29 MED ORDER — TRAMADOL HCL 50 MG PO TABS: ORAL_TABLET | ORAL | 0 refills | Status: DC

## 2019-07-29 NOTE — Telephone Encounter (Signed)
Medication has been refilled.

## 2019-07-30 ENCOUNTER — Other Ambulatory Visit: Payer: Self-pay | Admitting: *Deleted

## 2019-08-07 ENCOUNTER — Other Ambulatory Visit: Payer: Self-pay | Admitting: *Deleted

## 2019-08-07 ENCOUNTER — Other Ambulatory Visit: Payer: Self-pay | Admitting: Emergency Medicine

## 2019-08-07 DIAGNOSIS — E118 Type 2 diabetes mellitus with unspecified complications: Secondary | ICD-10-CM

## 2019-08-07 DIAGNOSIS — K219 Gastro-esophageal reflux disease without esophagitis: Secondary | ICD-10-CM

## 2019-08-07 MED ORDER — PANTOPRAZOLE SODIUM 40 MG PO TBEC: 40.0000 mg | DELAYED_RELEASE_TABLET | Freq: Every day | ORAL | 1 refills | Status: DC

## 2019-08-07 MED ORDER — SYNJARDY XR 25-1000 MG PO TB24: 1.0000 | ORAL_TABLET | Freq: Every day | ORAL | 1 refills | Status: DC

## 2019-08-08 ENCOUNTER — Other Ambulatory Visit: Payer: Self-pay | Admitting: Emergency Medicine

## 2019-08-08 DIAGNOSIS — E118 Type 2 diabetes mellitus with unspecified complications: Secondary | ICD-10-CM

## 2019-08-08 MED ORDER — TRULICITY 1.5 MG/0.5ML ~~LOC~~ SOAJ: SUBCUTANEOUS | 1 refills | Status: DC

## 2019-08-09 ENCOUNTER — Ambulatory Visit: Payer: Medicare Other | Admitting: Primary Care

## 2019-08-09 ENCOUNTER — Ambulatory Visit: Payer: Medicare Other | Admitting: Pulmonary Disease

## 2019-08-10 ENCOUNTER — Encounter: Payer: Self-pay | Admitting: Primary Care

## 2019-08-10 ENCOUNTER — Ambulatory Visit: Payer: Medicare Other | Admitting: Primary Care

## 2019-08-10 ENCOUNTER — Other Ambulatory Visit: Payer: Self-pay

## 2019-08-10 VITALS — BP 136/90 | HR 78 | Temp 97.3°F | Ht 61.5 in | Wt 187.8 lb

## 2019-08-10 DIAGNOSIS — J9801 Acute bronchospasm: Secondary | ICD-10-CM

## 2019-08-10 DIAGNOSIS — R0602 Shortness of breath: Secondary | ICD-10-CM

## 2019-08-10 DIAGNOSIS — J301 Allergic rhinitis due to pollen: Secondary | ICD-10-CM

## 2019-08-10 DIAGNOSIS — J45991 Cough variant asthma: Secondary | ICD-10-CM

## 2019-08-10 DIAGNOSIS — R058 Other specified cough: Secondary | ICD-10-CM

## 2019-08-10 DIAGNOSIS — R05 Cough: Secondary | ICD-10-CM | POA: Diagnosis not present

## 2019-08-10 HISTORY — DX: Cough variant asthma: J45.991

## 2019-08-10 LAB — BASIC METABOLIC PANEL
BUN: 13 mg/dL (ref 6–23)
CO2: 24 mEq/L (ref 19–32)
Calcium: 9.2 mg/dL (ref 8.4–10.5)
Chloride: 102 mEq/L (ref 96–112)
Creatinine, Ser: 0.51 mg/dL (ref 0.40–1.20)
GFR: 125.56 mL/min (ref >60.00)
Glucose, Bld: 336 mg/dL — ABNORMAL HIGH (ref 70–99)
Potassium: 3.9 mEq/L (ref 3.5–5.1)
Sodium: 136 mEq/L (ref 135–145)

## 2019-08-10 LAB — CBC WITH DIFFERENTIAL/PLATELET
Basophils Absolute: 0 10*3/uL (ref 0.0–0.1)
Basophils Relative: 0.3 % (ref 0.0–3.0)
Eosinophils Absolute: 0.2 10*3/uL (ref 0.0–0.7)
Eosinophils Relative: 1.9 % (ref 0.0–5.0)
HCT: 47.4 % — ABNORMAL HIGH (ref 36.0–46.0)
Hemoglobin: 15.9 g/dL — ABNORMAL HIGH (ref 12.0–15.0)
Lymphocytes Relative: 25.7 % (ref 12.0–46.0)
Lymphs Abs: 2.2 10*3/uL (ref 0.7–4.0)
MCHC: 33.5 g/dL (ref 30.0–36.0)
MCV: 90.4 fl (ref 78.0–100.0)
Monocytes Absolute: 0.5 10*3/uL (ref 0.1–1.0)
Monocytes Relative: 5.1 % (ref 3.0–12.0)
Neutro Abs: 5.9 10*3/uL (ref 1.4–7.7)
Neutrophils Relative %: 67 % (ref 43.0–77.0)
Platelets: 216 10*3/uL (ref 150.0–400.0)
RBC: 5.25 Mil/uL — ABNORMAL HIGH (ref 3.87–5.11)
RDW: 14 % (ref 11.5–15.5)
WBC: 8.8 10*3/uL (ref 4.0–10.5)

## 2019-08-10 MED ORDER — PROMETHAZINE-DM 6.25-15 MG/5ML PO SYRP: ORAL_SOLUTION | ORAL | 1 refills | Status: DC

## 2019-08-10 NOTE — Assessment & Plan Note (Addendum)
-   Continue ocean nasal spray at bedtime  - Trial Flonase every other day  - Consider adding Singulair

## 2019-08-10 NOTE — Patient Instructions (Addendum)
Recommendations: Continue Breo one puff daily (rinse mouth) Take tramadol every 6 hours to suppress cough Continue prescription cough medication Continue ocean nasal spray at night  Try flonase nasal spray every other day at night  Cough - Goal is to eliminate cough for three days and then taper off cough medication  - Avoid clearing throat or coughing - No mint or menthol products - Sips of water throughout the day   Orders: Full PFTs   Follow-up: After PFTs with Dr. Val Eagle or Waynetta Sandy

## 2019-08-10 NOTE — Assessment & Plan Note (Addendum)
-   Persistent cough with bronchospasm, worse at night/cold  - Some improvement with ICS/LABA   - Continue Breo 200 one puff daily - Take tramadol every 6 hours to suppress cough - Refill promethazine-dm 5ml at bedtime - Goal is to eliminate cough for three days and then taper off cough medication. Avoid clearing throat or coughing. No mint or menthol products. Sips of water throughout the day  - Needs PFTs with follow-up afterwards  - If not better consider HRCT if DLCO decreased

## 2019-08-10 NOTE — Progress Notes (Signed)
$'@Patient'i$  ID: Megan Murray, female    DOB: 08/12/1965, 54 y.o.   MRN: [id number]  Chief Complaint  Patient presents with  . Follow-up    Referring provider: Delorse Limber  HPI: 54 year old female, never smoked (second hand smoke) PMH significant for Covid in October 2020, post viral cough syndrome, cough variant asthma, diabetes, fibromyalgia, anxiety/depression, hypercholesterolemia. Patient of Dr. Ander Slade, seen for initial consult on 06/28/19. Started on Breo 200 one puff daily, prn albuterol    08/10/2019 Patient presents today for 2 week follow-up. States that she still has persistent cough, Breo has helped minimize it. Cough is worse at night or in the cold. Associated fatigue, wheezing and shortness of breath with coughing fits. She has been compliant with Breo 200 and Protonix. Reports nasal congestion but does not tolerate Nasacort. She uses ocean nasal spray at night. States that her mother smoked and had COPD, she was exposed to second hand smoked when younger. She also work at a pool in 2004 and during this time reports frequent bronchitis.   Allergies  Allergen Reactions  . Atorvastatin   . Cholestyramine     Gi upset  . Crestor [Rosuvastatin Calcium]   . Fenofibrate Other (See Comments)    STOMACH CRAMPS,AND DIARRHEA  . Simvastatin   . Soy Allergy   . Statins     GI upset; myalgias  . Codeine Palpitations    All codeine  . Meloxicam Rash    Immunization History  Administered Date(s) Administered  . Fluad Quad(high Dose 65+) 03/28/2019  . Influenza-Unspecified 03/12/2018  . Pneumococcal Polysaccharide-23 01/21/2015  . Tdap 03/03/2012, 07/13/2015    Past Medical History:  Diagnosis Date  . Asthma   . Diabetes mellitus without complication ([place])   . Dyslipidemia, goal to be determined    High triglycerides and LDL  . Fatty liver 05/16/2018   By CT scan  . Fibromyalgia   . Hyperlipidemia   . Hypertension   . Major depression, recurrent, chronic  ([place]) 04/25/2018   Dr. Toy Care  . Obesity due to excess calories without serious comorbidity   . Osteoarthritis     Tobacco History: Social History   Tobacco Use  Smoking Status Never Smoker  Smokeless Tobacco Never Used   Counseling given: Not Answered   Outpatient Medications Prior to Visit  Medication Sig Dispense Refill  . albuterol (VENTOLIN HFA) 108 (90 Base) MCG/ACT inhaler Inhale 2 puffs into the lungs every 6 (six) hours as needed for wheezing or shortness of breath. 6.7 g 2  . Blood Glucose Monitoring Suppl (ONE TOUCH ULTRA 2) w/Device KIT 1 kit by Does not apply route 2 (two) times daily. 1 each 0  . diclofenac sodium (VOLTAREN) 1 % GEL APP 0.5 TO 1 GRAM EXT AA QID PRN    . Dulaglutide (TRULICITY) 1.5 TI/4.5YK SOPN INJECT 1.5 MG(0.5MLS) UNDER THE SKIN ONCE A WEEK 12 pen 1  . Empagliflozin-metFORMIN HCl ER (SYNJARDY XR) 25-1000 MG TB24 Take 1 tablet by mouth daily. 90 tablet 1  . FLUoxetine (PROZAC) 20 MG capsule TAKE 1 CAPSULE(20 MG) BY MOUTH DAILY 30 capsule 2  . fluticasone furoate-vilanterol (BREO ELLIPTA) 100-25 MCG/INH AEPB Inhale 1 puff into the lungs daily. 14 each 0  . fluticasone furoate-vilanterol (BREO ELLIPTA) 200-25 MCG/INH AEPB Inhale 1 puff into the lungs daily. 1 each 3  . glucose blood (ONE TOUCH ULTRA TEST) test strip for testing twice daily 100 each 1  . glucose blood (ONE TOUCH ULTRA TEST)  test strip TEST BID 200 each 2  . pantoprazole (PROTONIX) 40 MG tablet Take 1 tablet (40 mg total) by mouth daily. 90 tablet 1  . traMADol (ULTRAM) 50 MG tablet TAKE 1 TABLET BY MOUTH EVERY 6 HOURS AS NEEDED FOR MODERATE PAIN 30 tablet 0  . VASCEPA 0.5 g CAPS TAKE 1 CAPSULE BY MOUTH AT BEDTIME 30 capsule 11  . zolpidem (AMBIEN) 10 MG tablet TAKE 1 TABLET(10 MG) BY MOUTH AT BEDTIME AS NEEDED FOR SLEEP 30 tablet 3  . promethazine-dextromethorphan (PROMETHAZINE-DM) 6.25-15 MG/5ML syrup Take 5 mls by mouth at bedtime. 473 mL 1   No facility-administered medications prior to  visit.   Review of Systems  Review of Systems  Constitutional: Positive for fatigue.  HENT: Positive for congestion.   Respiratory: Positive for cough and wheezing.    Physical Exam  BP 136/90 (BP Location: Right Arm, Cuff Size: Normal)   Pulse 78   Temp (!) 97.3 F (36.3 C) (Temporal)   Ht 5' 1.5" (1.562 m)   Wt 187 lb 12.8 oz (85.2 kg)   SpO2 98%   BMI 34.91 kg/m  Physical Exam Constitutional:      Appearance: Normal appearance.  HENT:     Mouth/Throat:     Comments: Deferred d/t masking Cardiovascular:     Rate and Rhythm: Normal rate and regular rhythm.  Pulmonary:     Effort: Pulmonary effort is normal.     Breath sounds: Normal breath sounds. No wheezing or rhonchi.  Neurological:     Mental Status: She is alert.      Lab Results:  CBC    Component Value Date/Time   WBC 8.8 08/10/2019 1041   RBC 5.25 (H) 08/10/2019 1041   HGB 15.9 (H) 08/10/2019 1041   HCT 47.4 (H) 08/10/2019 1041   PLT 216.0 08/10/2019 1041   MCV 90.4 08/10/2019 1041   MCHC 33.5 08/10/2019 1041   RDW 14.0 08/10/2019 1041   LYMPHSABS 2.2 08/10/2019 1041   MONOABS 0.5 08/10/2019 1041   EOSABS 0.2 08/10/2019 1041   BASOSABS 0.0 08/10/2019 1041    BMET    Component Value Date/Time   NA 136 08/10/2019 1041   NA 142 03/14/2018 0000   K 3.9 08/10/2019 1041   CL 102 08/10/2019 1041   CO2 24 08/10/2019 1041   GLUCOSE 336 (H) 08/10/2019 1041   BUN 13 08/10/2019 1041   BUN 11 03/14/2018 0000   CREATININE 0.51 08/10/2019 1041   CALCIUM 9.2 08/10/2019 1041    BNP No results found for: BNP  ProBNP No results found for: PROBNP  Imaging: DG Chest 2 View  Result Date: 07/12/2019 CLINICAL DATA:  Cough EXAM: CHEST - 2 VIEW COMPARISON:  04/19/2011 FINDINGS: The heart size and mediastinal contours are within normal limits. Both lungs are clear. The visualized skeletal structures are unremarkable. IMPRESSION: No active cardiopulmonary disease. Electronically Signed   By: Kathreen Devoid    On: 07/12/2019 15:03     Assessment & Plan:   Cough variant asthma - Persistent cough with bronchospasm, worse at night/cold  - Some improvement with ICS/LABA   - Continue Breo 200 one puff daily - Take tramadol every 6 hours to suppress cough - Refill promethazine-dm 86m at bedtime - Goal is to eliminate cough for three days and then taper off cough medication. Avoid clearing throat or coughing. No mint or menthol products. Sips of water throughout the day  - Needs PFTs with follow-up afterwards  - If not better  consider HRCT if DLCO decreased    Seasonal allergic rhinitis due to pollen - Continue ocean nasal spray at bedtime  - Trial Flonase every other day  - Consider adding Singulair     Martyn Ehrich, NP 08/10/2019

## 2019-08-11 LAB — BRAIN NATRIURETIC PEPTIDE: Brain Natriuretic Peptide: 12 pg/mL (ref <100)

## 2019-08-13 LAB — IGE: IgE (Immunoglobulin E), Serum: 4 kU/L (ref <=114)

## 2019-08-13 NOTE — Progress Notes (Signed)
Labs ok. BS elevated 336. Awaiting IgE still

## 2019-08-13 NOTE — Progress Notes (Signed)
IgE was normal. Follow up in March with PFTs as scheduled

## 2019-08-15 ENCOUNTER — Other Ambulatory Visit: Payer: Self-pay | Admitting: Physician Assistant

## 2019-08-15 MED ORDER — ZOLPIDEM TARTRATE 10 MG PO TABS: ORAL_TABLET | ORAL | 1 refills | Status: DC

## 2019-08-29 ENCOUNTER — Other Ambulatory Visit: Payer: Self-pay | Admitting: Physician Assistant

## 2019-08-29 DIAGNOSIS — M797 Fibromyalgia: Secondary | ICD-10-CM

## 2019-08-29 DIAGNOSIS — M159 Polyosteoarthritis, unspecified: Secondary | ICD-10-CM

## 2019-08-29 MED ORDER — TRAMADOL HCL 50 MG PO TABS: ORAL_TABLET | ORAL | 0 refills | Status: DC

## 2019-09-21 ENCOUNTER — Other Ambulatory Visit: Payer: Self-pay | Admitting: Emergency Medicine

## 2019-09-21 DIAGNOSIS — M159 Polyosteoarthritis, unspecified: Secondary | ICD-10-CM

## 2019-09-21 DIAGNOSIS — M797 Fibromyalgia: Secondary | ICD-10-CM

## 2019-09-21 DIAGNOSIS — F339 Major depressive disorder, recurrent, unspecified: Secondary | ICD-10-CM

## 2019-09-21 MED ORDER — TRAMADOL HCL 50 MG PO TABS: ORAL_TABLET | ORAL | 0 refills | Status: DC

## 2019-09-21 MED ORDER — FLUOXETINE HCL 20 MG PO CAPS: ORAL_CAPSULE | ORAL | 1 refills | Status: DC

## 2019-09-21 NOTE — Telephone Encounter (Signed)
Indication for medication: Fibromyalgia, Osteoarthritis of multiple joints Medication and dose: Tramadol 50 mg # pills per month: #30 on 08/29/19 Last UDS date: 06/21/2018 Opioid Treatment Agreement signed (Y/N): Yes 06/21/2018 Opioid Treatment Agreement last reviewed with patient:   [facility] reviewed this encounter (include red flags):     LOV: 06/13/19 Post viral cough

## 2019-09-27 ENCOUNTER — Encounter: Payer: Self-pay | Admitting: Physician Assistant

## 2019-09-28 NOTE — Telephone Encounter (Signed)
She would need to be seen by the Dentist for potential dental infection. Looks like she already has had assessment with them and they have given her instruction. Would recommend she follow their recommendations and if not improving follow-up with them to determine antibiotics/next steps.

## 2019-10-02 ENCOUNTER — Other Ambulatory Visit (HOSPITAL_COMMUNITY): Payer: Medicare Other

## 2019-10-05 ENCOUNTER — Ambulatory Visit: Payer: Medicare Other | Admitting: Primary Care

## 2019-10-15 ENCOUNTER — Other Ambulatory Visit: Payer: Self-pay | Admitting: Emergency Medicine

## 2019-10-15 DIAGNOSIS — M797 Fibromyalgia: Secondary | ICD-10-CM

## 2019-10-15 DIAGNOSIS — M159 Polyosteoarthritis, unspecified: Secondary | ICD-10-CM

## 2019-10-15 MED ORDER — ZOLPIDEM TARTRATE 10 MG PO TABS: ORAL_TABLET | ORAL | 1 refills | Status: DC

## 2019-10-15 NOTE — Telephone Encounter (Signed)
Ambien last rx 08/15/19 #30 1 RF Tramadol last rx 09/21/2019 #30 LOV: 06/13/19 Post viral cough syndrome

## 2019-10-19 ENCOUNTER — Other Ambulatory Visit: Payer: Self-pay | Admitting: Emergency Medicine

## 2019-10-19 DIAGNOSIS — M159 Polyosteoarthritis, unspecified: Secondary | ICD-10-CM

## 2019-10-19 DIAGNOSIS — M797 Fibromyalgia: Secondary | ICD-10-CM

## 2019-10-19 NOTE — Telephone Encounter (Signed)
Tramadol last rx 09/21/2019 #30

## 2019-10-21 ENCOUNTER — Other Ambulatory Visit: Payer: Self-pay | Admitting: Family Medicine

## 2019-10-21 MED ORDER — TRAMADOL HCL 50 MG PO TABS: ORAL_TABLET | ORAL | 0 refills | Status: DC

## 2019-10-22 ENCOUNTER — Telehealth: Payer: Self-pay | Admitting: Physician Assistant

## 2019-10-22 NOTE — Telephone Encounter (Signed)
You can disregard previous message - the medication refill was sent to Dr Mardelle Matte who sent it back to Megan Murray.  Pt was able to get it filled and she has picked it up.

## 2019-10-22 NOTE — Telephone Encounter (Signed)
Fine with me

## 2019-10-22 NOTE — Telephone Encounter (Signed)
I'm not taking transfers anymore. Ty.

## 2019-10-22 NOTE — Telephone Encounter (Signed)
Patient requesting TOC from Elane Fritz to [place], Marilu Favre.   Please advise

## 2019-10-22 NOTE — Telephone Encounter (Signed)
Her Tramadol was sent to the pharmacy yesterday by me so she needs to call them to pick up. We got duplicate requests for medication so one was denied since we had already approved the other request.

## 2019-10-22 NOTE — Telephone Encounter (Signed)
She had a medication that was denied through [facility] and she would like to know why. The medication was tramadol. Please call patient back to discuss.

## 2019-10-24 ENCOUNTER — Encounter: Payer: Self-pay | Admitting: Physician Assistant

## 2019-10-24 ENCOUNTER — Telehealth: Payer: Self-pay | Admitting: Physician Assistant

## 2019-10-24 ENCOUNTER — Other Ambulatory Visit: Payer: Self-pay

## 2019-10-24 ENCOUNTER — Telehealth (INDEPENDENT_AMBULATORY_CARE_PROVIDER_SITE_OTHER): Payer: Medicare Other | Admitting: Physician Assistant

## 2019-10-24 DIAGNOSIS — E118 Type 2 diabetes mellitus with unspecified complications: Secondary | ICD-10-CM

## 2019-10-24 DIAGNOSIS — G894 Chronic pain syndrome: Secondary | ICD-10-CM | POA: Diagnosis not present

## 2019-10-24 DIAGNOSIS — M8949 Other hypertrophic osteoarthropathy, multiple sites: Secondary | ICD-10-CM

## 2019-10-24 DIAGNOSIS — M159 Polyosteoarthritis, unspecified: Secondary | ICD-10-CM

## 2019-10-24 DIAGNOSIS — M797 Fibromyalgia: Secondary | ICD-10-CM

## 2019-10-24 DIAGNOSIS — M150 Primary generalized (osteo)arthritis: Secondary | ICD-10-CM

## 2019-10-24 DIAGNOSIS — F339 Major depressive disorder, recurrent, unspecified: Secondary | ICD-10-CM | POA: Diagnosis not present

## 2019-10-24 MED ORDER — FLUOXETINE HCL 40 MG PO CAPS: 40.0000 mg | ORAL_CAPSULE | Freq: Every day | ORAL | 3 refills | Status: DC

## 2019-10-24 MED ORDER — ZOLPIDEM TARTRATE ER 12.5 MG PO TBCR: 12.5000 mg | EXTENDED_RELEASE_TABLET | Freq: Every evening | ORAL | 0 refills | Status: DC | PRN

## 2019-10-24 NOTE — Telephone Encounter (Signed)
I have placed a Attending Physician's statement in the bin upfront with a charge sheet.

## 2019-10-24 NOTE — Telephone Encounter (Signed)
Paperwork in your folder for review and completion 

## 2019-10-24 NOTE — Progress Notes (Signed)
Virtual Visit via Video   I connected with patient on 10/24/19 at 10:00 AM EDT by a video enabled telemedicine application and verified that I am speaking with the correct person using two identifiers.  Location patient: Home Location provider: [facility], Office Persons participating in the virtual visit: Patient, Provider, [place] ([name])  I discussed the limitations of evaluation and management by telemedicine and the availability of in person appointments. The patient expressed understanding and agreed to proceed.  Subjective:   HPI:   Patient presents via [facility] today to follow-up on multiple chronic issues.  Patient with longstanding history of fibromyalgia, osteoarthritis of multiple sites and chronic pain.  Patient has been on disability since 2004.  Currently patient is on a regimen of Fluoxetine 20 mg and Tramadol 50 mg. Was previously on Duloxetine and Gabapentin but did not tolerate well..  Noted simple activities such as vacuuming or going grocery shopping believe her debilitated for 1+ days afterward. Was given injections previously without any improvement. Is willing to consider seeing a Physical Medicine specialist again.  Patient also with history of depression, currently on fluoxetine 20 mg daily.  Patient notes that the medication helps keep her mood stable.  Does not feel like she is having such labile emotions.  Notes does help somewhat with depressed mood with depressed mood is still present.  Denies acute anxiety or panic attack.  Is able to sleep with the use of her Ambien nightly, averaging 4 to 5 hours of sleep.  If she does not take this medicine then she can get no sleep.  Patient denies suicidal thought or ideation.   Patient also with history of type 2 diabetes, currently on a regimen of Trulicity and Synjardy.  Fasting blood sugars are averaging 130s. Patient endorses trying to watch what she eats. Notes earlier in the year sugars were higher as  she was getting over respiratory infection.   ROS:   See pertinent positives and negatives per HPI.  Patient Active Problem List   Diagnosis Date Noted  . Cough variant asthma 08/10/2019  . Chronic fatigue 04/26/2019  . Gastroesophageal reflux disease without esophagitis 04/26/2019  . Statin intolerance 06/21/2018  . Seasonal allergic rhinitis due to pollen 05/19/2018  . Fatty liver 05/16/2018  . Fibromyalgia 04/25/2018  . Major depression, recurrent, chronic ([place]) 04/25/2018  . Osteoarthritis, multiple sites 04/25/2018  . Essential hypertension 11/09/2016  . Type II diabetes mellitus with complication ([place]) 74/16/3845  . 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  . Mixed hyperlipidemia 11/04/2016  . B12 deficiency 01/23/2015    Social History   Tobacco Use  . Smoking status: Never Smoker  . Smokeless tobacco: Never Used  Substance Use Topics  . Alcohol use: No    Current Outpatient Medications:  .  albuterol (VENTOLIN HFA) 108 (90 Base) MCG/ACT inhaler, Inhale 2 puffs into the lungs every 6 (six) hours as needed for wheezing or shortness of breath., Disp: 6.7 g, Rfl: 2 .  Blood Glucose Monitoring Suppl (ONE TOUCH ULTRA 2) w/Device KIT, 1 kit by Does not apply route 2 (two) times daily., Disp: 1 each, Rfl: 0 .  diclofenac sodium (VOLTAREN) 1 % GEL, APP 0.5 TO 1 GRAM EXT AA QID PRN, Disp: , Rfl:  .  Dulaglutide (TRULICITY) 1.5 XM/4.6OE SOPN, INJECT 1.5 MG(0.5MLS) UNDER THE SKIN ONCE A WEEK, Disp: 12 pen, Rfl: 1 .  Empagliflozin-metFORMIN HCl ER (SYNJARDY XR) 25-1000 MG TB24, Take 1 tablet by mouth daily., Disp: 90 tablet, Rfl: 1 .  FLUoxetine (  PROZAC) 20 MG capsule, TAKE 1 CAPSULE(20 MG) BY MOUTH DAILY, Disp: 90 capsule, Rfl: 1 .  glucose blood (ONE TOUCH ULTRA TEST) test strip, for testing twice daily, Disp: 100 each, Rfl: 1 .  glucose blood (ONE TOUCH ULTRA TEST) test strip, TEST BID, Disp: 200 each, Rfl: 2 .  pantoprazole (PROTONIX) 40 MG tablet, Take 1 tablet (40 mg total)  by mouth daily., Disp: 90 tablet, Rfl: 1 .  traMADol (ULTRAM) 50 MG tablet, TAKE 1 TABLET BY MOUTH EVERY 6 HOURS AS NEEDED FOR MODERATE PAIN, Disp: 30 tablet, Rfl: 0 .  VASCEPA 0.5 g CAPS, TAKE 1 CAPSULE BY MOUTH AT BEDTIME, Disp: 30 capsule, Rfl: 11 .  zolpidem (AMBIEN) 10 MG tablet, TAKE 1 TABLET(10 MG) BY MOUTH AT BEDTIME AS NEEDED FOR SLEEP, Disp: 30 tablet, Rfl: 1 .  fluticasone furoate-vilanterol (BREO ELLIPTA) 100-25 MCG/INH AEPB, Inhale 1 puff into the lungs daily. (Patient not taking: Reported on 10/24/2019), Disp: 14 each, Rfl: 0 .  fluticasone furoate-vilanterol (BREO ELLIPTA) 200-25 MCG/INH AEPB, Inhale 1 puff into the lungs daily. (Patient not taking: Reported on 10/24/2019), Disp: 1 each, Rfl: 3  Allergies  Allergen Reactions  . Atorvastatin   . Cholestyramine     Gi upset  . Crestor [Rosuvastatin Calcium]   . Fenofibrate Other (See Comments)    STOMACH CRAMPS,AND DIARRHEA  . Simvastatin   . Soy Allergy   . Statins     GI upset; myalgias  . Codeine Palpitations    All codeine  . Meloxicam Rash    Objective:   There were no vitals taken for this visit.  Patient is well-developed, well-nourished in no acute distress.  Resting comfortably at home.  Head is normocephalic, atraumatic.  No labored breathing.  Speech is clear and coherent with logical content.  Patient is alert and oriented at baseline.   Assessment and Plan:   1. Major depression, recurrent, chronic (HCC) Increase Fluoxetine to 40 mg daily. Will also change Ambien to CR to see if we can help further with sleep maintenance instead of just sleep onset. In-office follow-up scheduled.  - zolpidem (AMBIEN CR) 12.5 MG CR tablet; Take 1 tablet (12.5 mg total) by mouth at bedtime as needed for sleep.  Dispense: 30 tablet; Refill: 0 - FLUoxetine (PROZAC) 40 MG capsule; Take 1 capsule (40 mg total) by mouth daily.  Dispense: 30 capsule; Refill: 3  2. Type II diabetes mellitus with complication (HCC) Taking  medications as directed. In-office appointment scheduled to update BP, foot exam, urine microalbumin. Continue current regimen for now.  3. Primary osteoarthritis involving multiple joints 4. Chronic pain syndrome 5. Fibromyalgia Will work on renewal of her paperwork. Referral to Physical Medicine placed to help with further pain control. Will also look into patient assistance for aqua therapy. She has done well with this previously but cost was a barrier. Recommend trying gentle stretches at home.  - Ambulatory referral to Physical Medicine Rehab    [name], PA-C 10/24/2019

## 2019-10-24 NOTE — Progress Notes (Signed)
I have discussed the procedure for the virtual visit with the patient who has given consent to proceed with assessment and treatment.   Lilliauna Van S Kenny Stern, CMA     

## 2019-10-24 NOTE — Telephone Encounter (Signed)
PT called and states she spoke to Gra[facility] and decided she wanted to stay with him.

## 2019-10-25 ENCOUNTER — Encounter: Payer: Self-pay | Admitting: Physician Assistant

## 2019-10-25 NOTE — Patient Instructions (Signed)
Instructions sent to MyChart

## 2019-10-29 ENCOUNTER — Encounter: Payer: Self-pay | Admitting: Physical Medicine and Rehabilitation

## 2019-10-30 DIAGNOSIS — Z0279 Encounter for issue of other medical certificate: Secondary | ICD-10-CM

## 2019-11-01 NOTE — Telephone Encounter (Signed)
Paperwork completed and signed. Med list attached to paperwork. Ready for fax. Please inform patient is ready for pickup.

## 2019-11-01 NOTE — Telephone Encounter (Signed)
Advised patient her paperwork was completed and ready at the front for pick up.

## 2019-11-12 ENCOUNTER — Ambulatory Visit (HOSPITAL_COMMUNITY)
Admission: RE | Admit: 2019-11-12 | Discharge: 2019-11-12 | Disposition: A | Discharge status: Home / Self Care | Payer: Medicare Other | Source: Ambulatory Visit | Attending: Physical Medicine and Rehabilitation | Admitting: Physical Medicine and Rehabilitation

## 2019-11-12 ENCOUNTER — Encounter: Payer: Self-pay | Admitting: Physical Medicine and Rehabilitation

## 2019-11-12 ENCOUNTER — Other Ambulatory Visit: Payer: Self-pay

## 2019-11-12 ENCOUNTER — Encounter
Payer: Medicare Other | Attending: Physical Medicine and Rehabilitation | Admitting: Physical Medicine and Rehabilitation

## 2019-11-12 VITALS — BP 132/86 | HR 83 | Temp 97.2°F | Ht 61.0 in | Wt 182.0 lb

## 2019-11-12 DIAGNOSIS — Z79891 Long term (current) use of opiate analgesic: Secondary | ICD-10-CM | POA: Insufficient documentation

## 2019-11-12 DIAGNOSIS — M1712 Unilateral primary osteoarthritis, left knee: Secondary | ICD-10-CM | POA: Insufficient documentation

## 2019-11-12 DIAGNOSIS — Z5181 Encounter for therapeutic drug level monitoring: Secondary | ICD-10-CM | POA: Diagnosis present

## 2019-11-12 DIAGNOSIS — G894 Chronic pain syndrome: Secondary | ICD-10-CM | POA: Insufficient documentation

## 2019-11-12 DIAGNOSIS — M7918 Myalgia, other site: Secondary | ICD-10-CM

## 2019-11-12 DIAGNOSIS — M1711 Unilateral primary osteoarthritis, right knee: Secondary | ICD-10-CM | POA: Insufficient documentation

## 2019-11-12 DIAGNOSIS — M797 Fibromyalgia: Secondary | ICD-10-CM | POA: Diagnosis not present

## 2019-11-12 HISTORY — DX: Myalgia, other site: M79.18

## 2019-11-12 HISTORY — DX: Unilateral primary osteoarthritis, left knee: M17.12

## 2019-11-12 HISTORY — DX: Unilateral primary osteoarthritis, right knee: M17.11

## 2019-11-12 MED ORDER — LIDOCAINE 5 % EX PTCH: 2.0000 | MEDICATED_PATCH | CUTANEOUS | 5 refills | Status: DC

## 2019-11-12 NOTE — Patient Instructions (Signed)
  1. Can take over tramadol Rx.  Once get drug screen back, can prescribe tramadol for pt- will do tramadol 50 mg 2x/day as needed  2. Myofascial release for hands  3. Theracane 2-4 minutes firm pressure- no massage- on areas that are painful-   4. Lidocaine patches- 2 patches 12 hrs on;12 hrs off- #60 5 RFs  5. Come back for trigger point injections in next 2 weeks.   6. Tennis balls- can use on buttocks, back of thighs and low back- 2-4 minutes firm pressure  1. Dr Nance Pew- The survivor's Handbook to Fibromyalgia and Myofascial Pain Syndrome 2. Theracane- 2-4 minutes on each trigger point- hold firm pressure, don't massage; can get for $20-30 online- will never need replacing 3. Tennis balls- 2-5 minutes on each trigger point- buttocks, back of thighs, calves, low and mid back- can throw in dryer x1 to make softer. 4. Magnesium 400 mg 2-3x/day for muscle tightness- titrate up until loose stools 5. Lidocaine patches- 2-3 patches 12 hrs on;12 hrs off- areas of most pain- never on bottom of feet 6. Rolling pin- can use on calves, thighs and arms, and buttocks- roll slowly over muscles firmly- roll towards heart   7. F/U in 2 weeks for trigger point injections.

## 2019-11-12 NOTE — Progress Notes (Signed)
Subjective:    Patient ID: Megan Murray, female    DOB: 1965/12/23, 54 y.o.   MRN: [id number]  HPI  HPI Patient is a 54 yr old R handed female with PMHx of DM, fibromyalgia- in 2000, DJD and depression who's here for chronic pain evaluation.    Had xrays of knees- but been a couple of years.  Told her "had DJD in every joint"   Had HLA B27- gene  Told her has the gene- but hasn't been dx'd with anklylosing spondylitis.    Tested (+) for COVID 10/20- developed a cough- kept for 5 months "post viral cough".  Lots of inhaled steroids- cleared up in 2/21.   Tooth hurting in 2/21- put in bone graft in.  Took bone graft out- was candidate initially for implant. Not now.  Was told presented like "lupus pt".    Never had trigger point injections.  Had steroid injection in past in R hip and B/L knees-  Made her BGs go >300.  Doesn't want to do again.   Lidocaine patches were real helpful- but insurance stopped paying for them Dry needling also helped- in calves mainly.  Water therapy- helped a lot- but cannot pay copays a couple times per week.    Right now, worst pain is Neck- posterior- Makes head and shoulders hurt.   Times pain flares Of course, but just tries to do what's possible.   Tramadol- only takes 1x/day- usually takes [employer] will only let her get 30 pills at a time.     As far as DJD the thing that hurts most is, knees, ankles, feet, etc.  R knee hurts worse.    Always have fibrofog- since had COVID- been worse.   Allergic to Meloxicam Not allergic to any other NSAIDs.  Taking voltaren gel and also has 50 mg Diclofenac, but "doesn't do anything" so doesn't take.  Another pill to take, and hasn't noticed a lot of difference with diclofenac for DJD/arthritis.    Pain Inventory Average Pain 7 Pain Right Now 5 My pain is constant, stabbing and aching  In the last 24 hours, has pain interfered with the  following? General activity 7 Relation with others 5 Enjoyment of life 7 What TIME of day is your pain at its worst? all Sleep (in general) Poor  Pain is worse with: standing and some activites Pain improves with: heat/ice, therapy/exercise and medication Relief from Meds: 3  Mobility walk without assistance ability to climb steps?  yes do you drive?  yes  Function disabled: date disabled . I need assistance with the following:  meal prep, household duties and shopping  Neuro/Psych weakness spasms confusion depression anxiety  Prior Studies new  Physicians involved in your care new   Family History  Problem Relation Age of Onset  . COPD Mother        Died at 44  . Heart failure Mother        Not sure of details  . Esophageal cancer Father        Survived cancer treatment and surgery  . Drug abuse Father   . Cirrhosis Father        NAFLD  . Healthy Sister   . Diabetes Brother   . Depression Brother   . Heart attack Brother   . Heart disease Brother   . Diabetes Maternal Grandmother   . Arthritis Maternal Grandmother   . Cancer Paternal Grandmother   . Hearing loss Paternal Grandmother   .  Liver disease Paternal Grandfather   . Diabetes Maternal Grandfather    Social History   Socioeconomic History  . Marital status: Married    Spouse name: Not on file  . Number of children: 1  . Years of education: Not on file  . Highest education level: Not on file  Occupational History  . Occupation: disabled due to fibromyalgia and depression  Tobacco Use  . Smoking status: Never Smoker  . Smokeless tobacco: Never Used  Substance and Sexual Activity  . Alcohol use: No  . Drug use: No  . Sexual activity: Yes  Other Topics Concern  . Not on file  Social History Narrative  . Not on file   Social Determinants of Health   Financial Resource Strain:   . Difficulty of Paying Living Expenses:   Food Insecurity:   . Worried About Charity fundraiser in the  Last Year:   . Arboriculturist in the Last Year:   Transportation Needs:   . Film/video editor (Medical):   Marland Kitchen Lack of Transportation (Non-Medical):   Physical Activity:   . Days of Exercise per Week:   . Minutes of Exercise per Session:   Stress:   . Feeling of Stress :   Social Connections:   . Frequency of Communication with Friends and Family:   . Frequency of Social Gatherings with Friends and Family:   . Attends Religious Services:   . Active Member of Clubs or Organizations:   . Attends Archivist Meetings:   Marland Kitchen Marital Status:    Past Surgical History:  Procedure Laterality Date  . ABDOMINAL HYSTERECTOMY    . REDUCTION MAMMAPLASTY     Past Medical History:  Diagnosis Date  . Asthma   . Diabetes mellitus without complication ([place])   . Dyslipidemia, goal to be determined    High triglycerides and LDL  . Fatty liver 05/16/2018   By CT scan  . Fibromyalgia   . Hyperlipidemia   . Hypertension   . Major depression, recurrent, chronic ([place]) 04/25/2018   Dr. Toy Care  . Obesity due to excess calories without serious comorbidity   . Osteoarthritis    Pulse 83   Temp (!) 97.2 F (36.2 C)   Ht 5' 1" (1.549 m)   Wt 182 lb (82.6 kg)   SpO2 97%   BMI 34.39 kg/m   Opioid Risk Score:   Fall Risk Score:  `1  Depression screen PHQ 2/9  Depression screen [facility] 2/9 11/12/2019 10/24/2019 04/26/2019 10/24/2018 07/24/2018 04/25/2018  Decreased Interest 1 2 0 0 1 1  Down, Depressed, Hopeless 3 2 0 0 1 1  PHQ - 2 Score 4 4 0 0 2 2  Altered sleeping 0 1 3 - 3 2  Tired, decreased energy _0 - 3 3  Change in appetite 1 0 0 - 1 1  Feeling bad or failure about yourself  0 0 0 - 0 2  Trouble concentrating 1 0 0 - 0 0  Moving slowly or fidgety/restless 0 0 0 - 0 0  Suicidal thoughts 0 0 0 - 0 0  PHQ-9 Score _1 - 9 10  Difficult doing work/chores - - Somewhat difficult - Somewhat difficult Somewhat difficult    Review of Systems  Constitutional: Negative.   HENT:  Negative.   Eyes: Negative.   Respiratory: Negative.   Cardiovascular: Negative.   Gastrointestinal: Positive for constipation and diarrhea.  Endocrine:  High blood sugar  Genitourinary: Negative.   Musculoskeletal: Positive for arthralgias, back pain, myalgias and neck pain.       Spasms   Skin: Negative.   Allergic/Immunologic: Negative.   Neurological: Positive for weakness.  Psychiatric/Behavioral: Positive for confusion and dysphoric mood.  All other systems reviewed and are negative.      Objective:   Physical Exam   Physical Exam  Awake, alert, appropriate, NAD Some crepitus in B/L - 1+ smooth ROM of B/L knees No effusions seen Tender points on exam at least 10  Also trigger point - in upper traps, levators,s scalenes, and splenius capitus and pecs and rhomboids B/L       Assessment & Plan:   1. Can take over tramadol Rx.  Once get drug screen back, can prescribe tramadol for pt- will do tramadol 50 mg 2x/day as needed- opiate contract and urine drug screen to be done today.   2. Myofascial release for hands- showed pt.   3. Theracane 2-4 minutes firm pressure- no massage- on areas that are painful-   4. Lidocaine patches- 2 patches 12 hrs on;12 hrs off- #60 5 RFs- needs for myofascial and fibromyalgia pain.   5. Come back for trigger point injections in next 2 weeks.   6. Tennis balls- can use on buttocks, back of thighs and low back- 2-4 minutes firm pressure  1. Dr Sharion Balloon- The survivor's Handbook to Fibromyalgia and Myofascial Pain Syndrome 2. Theracane- 2-4 minutes on each trigger point- hold firm pressure, don't massage; can get for $20-30 online- will never need replacing 3. Tennis balls- 2-5 minutes on each trigger point- buttocks, back of thighs, calves, low and mid back- can throw in dryer x1 to make softer. 4. Magnesium 400 mg 2-3x/day for muscle tightness- titrate up until loose stools 5. Lidocaine patches- 2-3 patches 12 hrs  on;12 hrs off- areas of most pain- never on bottom of feet 6. Rolling pin- can use on calves, thighs and arms, and buttocks- roll slowly over muscles firmly- roll towards heart   7. F/U in 2 weeks for trigger point injections.   I spent a total of 55 minutes on appointment- as detailed above.

## 2019-11-14 LAB — TOXASSURE SELECT,+ANTIDEPR,UR

## 2019-11-21 ENCOUNTER — Telehealth: Payer: Self-pay | Admitting: *Deleted

## 2019-11-21 DIAGNOSIS — M797 Fibromyalgia: Secondary | ICD-10-CM

## 2019-11-21 DIAGNOSIS — M159 Polyosteoarthritis, unspecified: Secondary | ICD-10-CM

## 2019-11-21 MED ORDER — TRAMADOL HCL 50 MG PO TABS: ORAL_TABLET | ORAL | 3 refills | Status: DC

## 2019-11-21 NOTE — Telephone Encounter (Signed)
Sent in Rx for tramadol 50 mg q8 hours prn #90 3 refills Thank you

## 2019-11-21 NOTE — Telephone Encounter (Signed)
Megan Murray called and is requesting medication(tramadol)  be sent in to the pharmacy since her urine drug screen was consistent with medications prescribed. Please advise.

## 2019-11-22 NOTE — Telephone Encounter (Signed)
Left message on VM per DPR. 

## 2019-11-23 ENCOUNTER — Telehealth: Payer: Self-pay | Admitting: *Deleted

## 2019-11-23 NOTE — Telephone Encounter (Signed)
Urine drug screen for this encounter is consistent for prescribed medication 

## 2019-11-30 ENCOUNTER — Other Ambulatory Visit: Payer: Self-pay

## 2019-11-30 ENCOUNTER — Encounter: Payer: Medicare Other | Admitting: Physical Medicine and Rehabilitation

## 2019-11-30 ENCOUNTER — Encounter: Payer: Self-pay | Admitting: Physical Medicine and Rehabilitation

## 2019-11-30 VITALS — BP 136/90 | HR 96 | Temp 97.5°F | Ht 61.0 in | Wt 181.0 lb

## 2019-11-30 DIAGNOSIS — M1711 Unilateral primary osteoarthritis, right knee: Secondary | ICD-10-CM | POA: Diagnosis not present

## 2019-11-30 DIAGNOSIS — M797 Fibromyalgia: Secondary | ICD-10-CM

## 2019-11-30 DIAGNOSIS — M7918 Myalgia, other site: Secondary | ICD-10-CM | POA: Diagnosis not present

## 2019-11-30 NOTE — Progress Notes (Signed)
Patient is a 54 yr old R handed female with PMHx of DM, fibromyalgia- in 2000, DJD and depression who's here for chronic pain evaluation.    Tramadol- mostly takes 1/day- occ 2x/day.   Lidocaine patches are great- slept better than has in years. Had a theracane- has been using it- working some.   Ordered Mg- if takes 1 tab/in bathrom all day [place].      Plan: 1. Try 1/2 tab daily- if possible- since 1 tab makes her have diarrhea.   2. Patient here for trigger point injections for myofascial pain  Consent done and on chart.  Cleaned areas with alcohol and injected using a 27 gauge 1.5 inch needle  Injected 4.5cc Using 1% Lidocaine with no EPI  Upper traps B/L Levators B/L Posterior scalenes B/L Middle scalenes Splenius Capitus Pectoralis Major B/L Rhomboids R only Infraspinatus Teres Major/minor Thoracic paraspinals Lumbar paraspinals Other injections- L deltoid/triceps   Patient's level of pain prior was 3/10 Current level of pain after injections is 2/10- a little looser  There was no bleeding or complications.  Patient was advised to drink a lot of water on day after injections to flush system Will have increased soreness for 12-48 hours after injections.  Can use Lidocaine patches the day AFTER injections Can use theracane on day of injections in places didn't inject Can use heating pad/ice  4-6 hours AFTER injections  3. Showed you where to put lidocaine patches- to cover part of neck as well.   4. F/U in 4 weeks for trigger point injections

## 2019-11-30 NOTE — Patient Instructions (Signed)
Plan: 1. Try 1/2 tab daily- if possible- since 1 tab makes her have diarrhea.   2. Patient here for trigger point injections for myofascial pain  Consent done and on chart.  Cleaned areas with alcohol and injected using a 27 gauge 1.5 inch needle  Injected 4.5cc Using 1% Lidocaine with no EPI  Upper traps B/L Levators B/L Posterior scalenes B/L Middle scalenes Splenius Capitus Pectoralis Major B/L Rhomboids R only Infraspinatus Teres Major/minor Thoracic paraspinals Lumbar paraspinals Other injections- L deltoid/triceps   Patient's level of pain prior was 3/10 Current level of pain after injections is 2/10- a little looser  There was no bleeding or complications.  Patient was advised to drink a lot of water on day after injections to flush system Will have increased soreness for 12-48 hours after injections.  Can use Lidocaine patches the day AFTER injections Can use theracane on day of injections in places didn't inject Can use heating pad/ice  4-6 hours AFTER injections  3. Showed you where to put lidocaine patches- to cover part of neck as well.   4. F/U in 4 weeks for trigger point injections

## 2019-12-09 ENCOUNTER — Other Ambulatory Visit: Payer: Self-pay | Admitting: Family Medicine

## 2019-12-09 DIAGNOSIS — E119 Type 2 diabetes mellitus without complications: Secondary | ICD-10-CM

## 2019-12-17 ENCOUNTER — Other Ambulatory Visit: Payer: Self-pay | Admitting: Physician Assistant

## 2019-12-17 DIAGNOSIS — F339 Major depressive disorder, recurrent, unspecified: Secondary | ICD-10-CM

## 2019-12-17 MED ORDER — ZOLPIDEM TARTRATE ER 12.5 MG PO TBCR: 12.5000 mg | EXTENDED_RELEASE_TABLET | Freq: Every evening | ORAL | 0 refills | Status: DC | PRN

## 2020-01-04 ENCOUNTER — Encounter
Payer: Medicare Other | Attending: Physical Medicine and Rehabilitation | Admitting: Physical Medicine and Rehabilitation

## 2020-01-04 ENCOUNTER — Encounter: Payer: Self-pay | Admitting: Physical Medicine and Rehabilitation

## 2020-01-04 ENCOUNTER — Other Ambulatory Visit: Payer: Self-pay

## 2020-01-04 VITALS — BP 134/78 | HR 101 | Temp 97.6°F | Ht 61.0 in | Wt 180.2 lb

## 2020-01-04 DIAGNOSIS — M1711 Unilateral primary osteoarthritis, right knee: Secondary | ICD-10-CM | POA: Insufficient documentation

## 2020-01-04 DIAGNOSIS — M7918 Myalgia, other site: Secondary | ICD-10-CM | POA: Insufficient documentation

## 2020-01-04 DIAGNOSIS — G894 Chronic pain syndrome: Secondary | ICD-10-CM | POA: Insufficient documentation

## 2020-01-04 DIAGNOSIS — M1712 Unilateral primary osteoarthritis, left knee: Secondary | ICD-10-CM | POA: Insufficient documentation

## 2020-01-04 DIAGNOSIS — Z5181 Encounter for therapeutic drug level monitoring: Secondary | ICD-10-CM | POA: Insufficient documentation

## 2020-01-04 DIAGNOSIS — M797 Fibromyalgia: Secondary | ICD-10-CM | POA: Insufficient documentation

## 2020-01-04 DIAGNOSIS — Z79891 Long term (current) use of opiate analgesic: Secondary | ICD-10-CM | POA: Diagnosis present

## 2020-01-04 NOTE — Patient Instructions (Signed)
  Plan: 1. Has refills on Tramadol currently.   2. Diclofenac gel - up to 4x/day- on shoulders- AC joints/top of shoulders  3. Patient here for trigger point injections for myofascial pain/fibromyalgia  Consent done and on chart.  Cleaned areas with alcohol and injected using a 27 gauge 1.5 inch needle  Injected 6cc Using 1% Lidocaine with no EPI  Upper traps B/L Levators B/L Posterior scalenes B/L Middle scalenes Splenius Capitus B/L- upper neck Pectoralis Major B/L Rhomboids B/L- lower rhomboids Infraspinatus Teres Major/minor Thoracic paraspinals Lumbar paraspinals Other injections- B/L below ribs on sides laterally    Patient's level of pain prior was- 7/10 Current level of pain after injections is- 3/10- and has more ROM in neck  There was no bleeding or complications.  Patient was advised to drink a lot of water on day after injections to flush system Will have increased soreness for 12-48 hours after injections.  Can use Lidocaine patches the day AFTER injections Can use theracane on day of injections in places didn't inject Can use heating pad 4-6 hours AFTER injections  4. Theracane- please use at least 3x/week- for ~ 20-30 minutes- 2-4 minutes on each trigger point.   5. F/U in 4-6 weeks.

## 2020-01-04 NOTE — Progress Notes (Signed)
Patient is a 54 yr old R handed female with PMHx of DM, fibromyalgia- in 2000, DJD and depression who's here for chronic pain f/u.     Thought initially maybe, wasn't sure if was working-  until started getting tight again. In the last week.   Last 2 days, has gone back to real tight.  HA's last 2 nights.   Today, feels hung over from HA.   Also started having pain in other places, since main pain was improved, these pains started showing up.   Was the first time did trigger point injections last visit.  Still using lidocaine patches- are best thing ever for pain.   Theracane- almost every day for the past 2 weeks.  Also did ice across shoulders- pain relief goes away after off ice.   Son bought house and renovating- family moved in to her house early with dog- clutter- and cleaned all day- which made her worse.  Paid for cleaning last night.    Tramadol- taking 1x/day- some days 2x/day.    Plan: 1. Has refills on Tramadol currently.   2. Diclofenac gel - up to 4x/day- on shoulders- AC joints/top of shoulders  3. Patient here for trigger point injections for myofascial pain/fibromyalgia  Consent done and on chart.  Cleaned areas with alcohol and injected using a 27 gauge 1.5 inch needle  Injected 6cc Using 1% Lidocaine with no EPI  Upper traps B/L Levators B/L Posterior scalenes B/L Middle scalenes Splenius Capitus B/L- upper neck Pectoralis Major B/L Rhomboids B/L- lower rhomboids Infraspinatus Teres Major/minor Thoracic paraspinals Lumbar paraspinals Other injections- B/L below ribs on sides laterally    Patient's level of pain prior was- 7/10 Current level of pain after injections is- 3/10- and has more ROM in neck  There was no bleeding or complications.  Patient was advised to drink a lot of water on day after injections to flush system Will have increased soreness for 12-48 hours after injections.  Can use Lidocaine patches the day AFTER  injections Can use theracane on day of injections in places didn't inject Can use heating pad 4-6 hours AFTER injections  4. Theracane- please use at least 3x/week- for ~ 20-30 minutes- 2-4 minutes on each trigger point.   5. F/U in 4-6 weeks.    I spent a total of 30 minutes on visit- as detailed above.

## 2020-01-16 ENCOUNTER — Other Ambulatory Visit: Payer: Self-pay | Admitting: Physician Assistant

## 2020-01-16 DIAGNOSIS — F339 Major depressive disorder, recurrent, unspecified: Secondary | ICD-10-CM

## 2020-01-16 MED ORDER — ZOLPIDEM TARTRATE ER 12.5 MG PO TBCR: 12.5000 mg | EXTENDED_RELEASE_TABLET | Freq: Every evening | ORAL | 0 refills | Status: DC | PRN

## 2020-01-16 NOTE — Telephone Encounter (Signed)
Pt called in asking for a new script of the Ambien 10mg  tabs. Pt uses [facility].

## 2020-01-16 NOTE — Telephone Encounter (Signed)
Ambien last rx 12/17/19 #30 LOV: 10/24/19 Depression [facility]: 06/21/18 Ambien and Tramadol-Dr Mardelle Matte

## 2020-01-17 MED ORDER — ZOLPIDEM TARTRATE 10 MG PO TABS: 10.0000 mg | ORAL_TABLET | Freq: Every evening | ORAL | 0 refills | Status: DC | PRN

## 2020-01-17 NOTE — Addendum Note (Signed)
Addended by: Con Memos on: 01/17/2020 01:55 PM   Modules accepted: Orders

## 2020-01-17 NOTE — Telephone Encounter (Signed)
Called patient advised rx was sent to the pharmacy

## 2020-01-17 NOTE — Telephone Encounter (Signed)
Patient called back today and states the Ambien CR 12.5 mg was sent to the pharmacy but is not covered by her insurance.  Patient wanted the Ambien 10 mg refilled. Will call patient pharmacy-[facility] Pharmacist states she needs a PA for 12.5 mg. She has to fail Belsomra, Trazodone and Restoril The Ambien 10 mg is covered. Can this be changed to the Ambien 10 mg at bedtime

## 2020-01-21 ENCOUNTER — Other Ambulatory Visit: Payer: Self-pay | Admitting: Family Medicine

## 2020-01-21 ENCOUNTER — Other Ambulatory Visit: Payer: Self-pay | Admitting: General Practice

## 2020-01-21 DIAGNOSIS — E118 Type 2 diabetes mellitus with unspecified complications: Secondary | ICD-10-CM

## 2020-01-21 MED ORDER — TRULICITY 1.5 MG/0.5ML ~~LOC~~ SOAJ: SUBCUTANEOUS | 1 refills | Status: DC

## 2020-01-28 IMAGING — DX DG CHEST 2V
2 series · 2 of 2 positions shown · non-contrast
Comparison: 04/19/2011

CLINICAL DATA: Cough

EXAM:
CHEST - 2 VIEW

[chest pa]
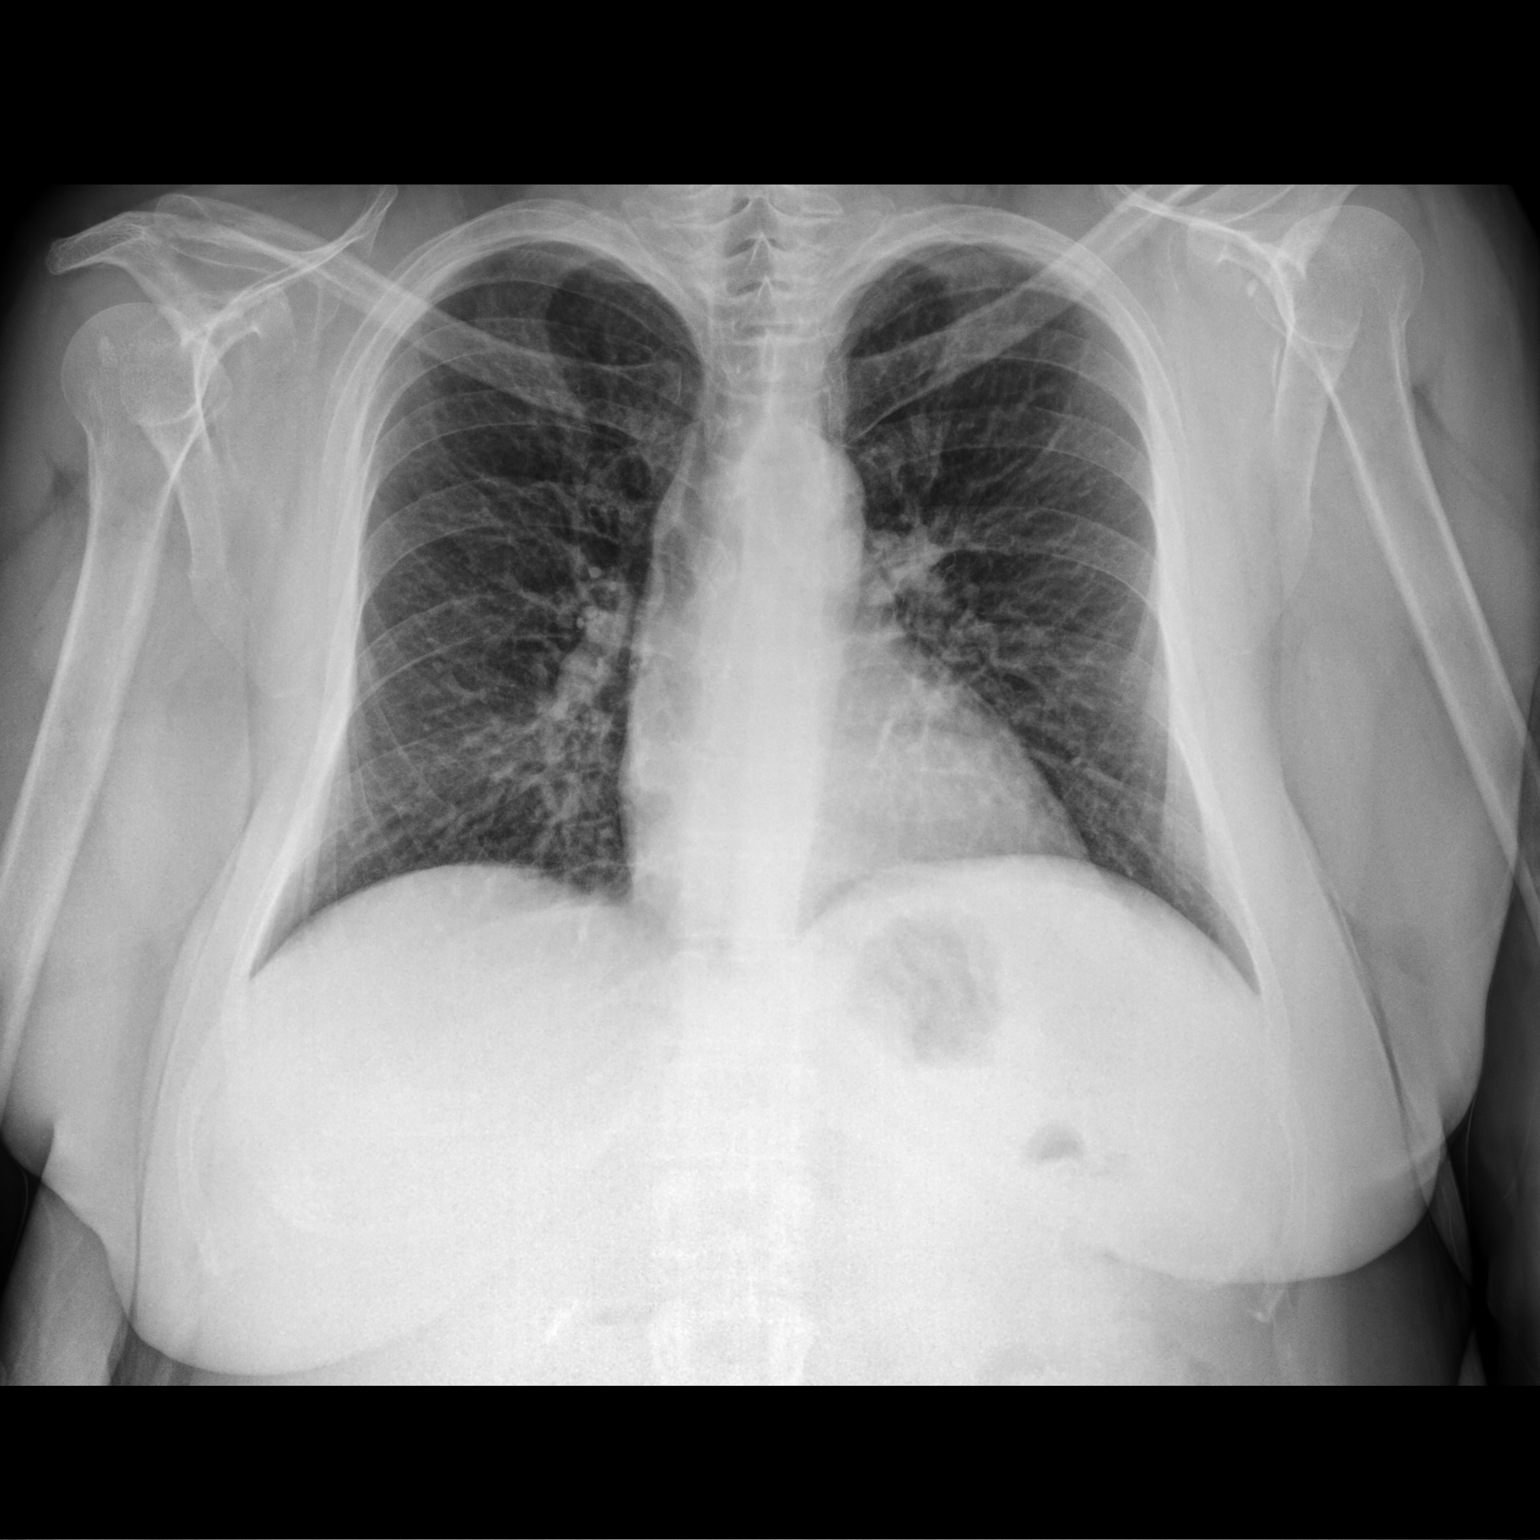

[chest lat]
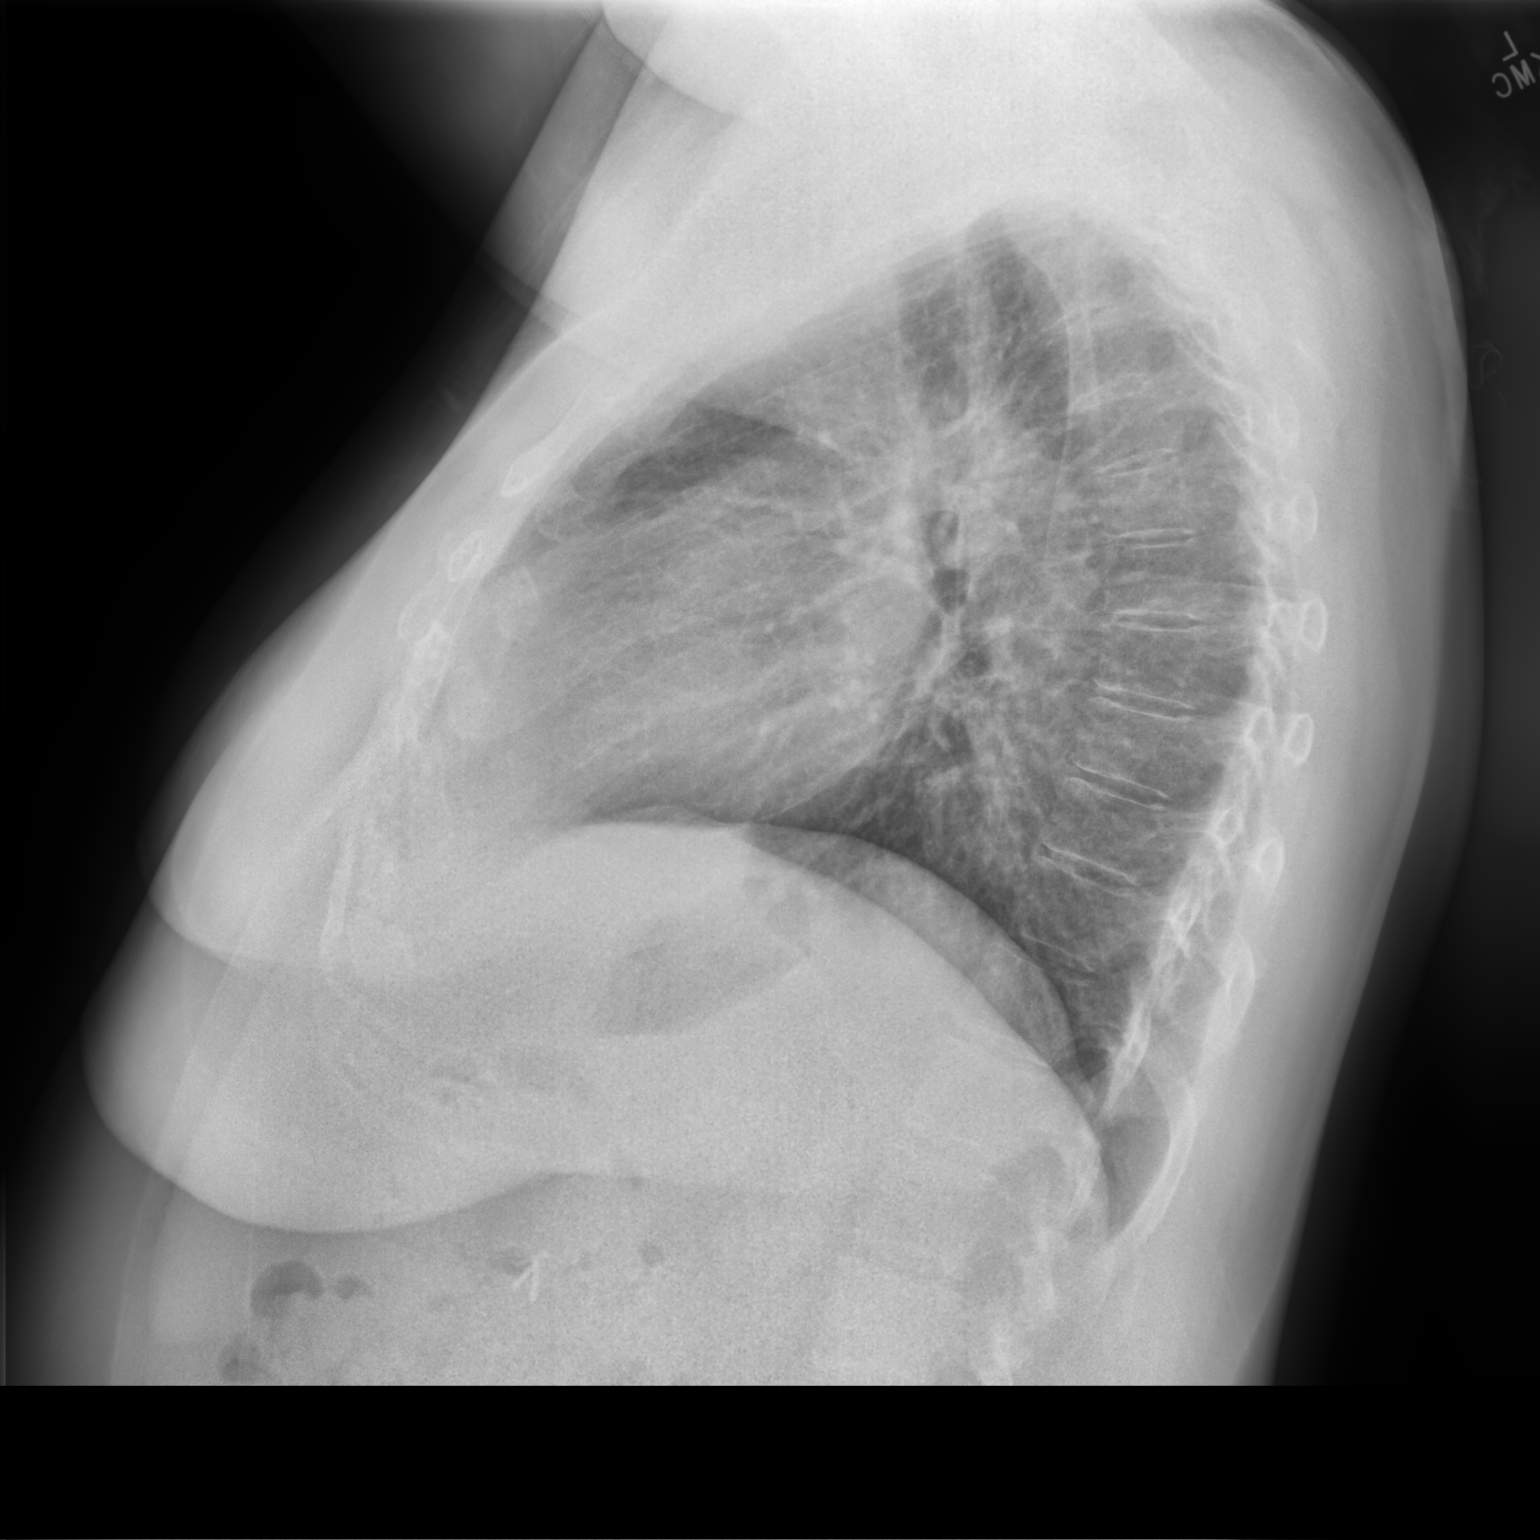

[2 of 2 positions shown; findings below may reference images not displayed]

FINDINGS: The heart size and mediastinal contours are within normal limits.
Both lungs are clear. The visualized skeletal structures are
unremarkable.
IMPRESSION: No active cardiopulmonary disease.

## 2020-02-01 ENCOUNTER — Other Ambulatory Visit: Payer: Self-pay | Admitting: Physician Assistant

## 2020-02-01 DIAGNOSIS — K219 Gastro-esophageal reflux disease without esophagitis: Secondary | ICD-10-CM

## 2020-02-06 ENCOUNTER — Ambulatory Visit: Payer: Medicare Other | Admitting: Physical Medicine and Rehabilitation

## 2020-02-06 ENCOUNTER — Other Ambulatory Visit: Payer: Self-pay | Admitting: Physician Assistant

## 2020-02-06 DIAGNOSIS — E118 Type 2 diabetes mellitus with unspecified complications: Secondary | ICD-10-CM

## 2020-02-14 ENCOUNTER — Telehealth: Payer: Self-pay | Admitting: Physician Assistant

## 2020-02-14 NOTE — Telephone Encounter (Signed)
Pharmacy appeal paperwork completed for her Ambien.  Given to CMA for fax.

## 2020-02-14 NOTE — Telephone Encounter (Signed)
Form placed in Cody's bin

## 2020-02-14 NOTE — Telephone Encounter (Signed)
Form in your folder for completion 

## 2020-02-14 NOTE — Telephone Encounter (Signed)
Form faxed back to [employer]

## 2020-02-18 ENCOUNTER — Other Ambulatory Visit: Payer: Self-pay | Admitting: Physician Assistant

## 2020-02-18 DIAGNOSIS — F339 Major depressive disorder, recurrent, unspecified: Secondary | ICD-10-CM

## 2020-02-18 NOTE — Telephone Encounter (Signed)
Zolpidem LFD 01/17/20 #30 with no refills LOV 10/24/19 NOV none

## 2020-02-27 ENCOUNTER — Other Ambulatory Visit: Payer: Self-pay | Admitting: Physician Assistant

## 2020-02-27 DIAGNOSIS — F339 Major depressive disorder, recurrent, unspecified: Secondary | ICD-10-CM

## 2020-03-05 ENCOUNTER — Ambulatory Visit (INDEPENDENT_AMBULATORY_CARE_PROVIDER_SITE_OTHER): Payer: Medicare Other | Admitting: Physician Assistant

## 2020-03-05 ENCOUNTER — Other Ambulatory Visit: Payer: Self-pay

## 2020-03-05 ENCOUNTER — Encounter: Payer: Self-pay | Admitting: Physician Assistant

## 2020-03-05 VITALS — BP 110/80 | HR 77 | Temp 98.0°F | Resp 16 | Ht 61.0 in | Wt 180.0 lb

## 2020-03-05 DIAGNOSIS — F339 Major depressive disorder, recurrent, unspecified: Secondary | ICD-10-CM | POA: Diagnosis not present

## 2020-03-05 DIAGNOSIS — M7918 Myalgia, other site: Secondary | ICD-10-CM | POA: Diagnosis not present

## 2020-03-05 DIAGNOSIS — E119 Type 2 diabetes mellitus without complications: Secondary | ICD-10-CM | POA: Diagnosis not present

## 2020-03-05 LAB — LIPID PANEL
Cholesterol: 234 mg/dL — ABNORMAL HIGH (ref 0–200)
HDL: 49.2 mg/dL (ref >39.00)
NonHDL: 184.32
Total CHOL/HDL Ratio: 5
Triglycerides: 319 mg/dL — ABNORMAL HIGH (ref 0.0–149.0)
VLDL: 63.8 mg/dL — ABNORMAL HIGH (ref 0.0–40.0)

## 2020-03-05 LAB — COMPREHENSIVE METABOLIC PANEL
ALT: 17 U/L (ref 0–35)
AST: 14 U/L (ref 0–37)
Albumin: 4.5 g/dL (ref 3.5–5.2)
Alkaline Phosphatase: 79 U/L (ref 39–117)
BUN: 10 mg/dL (ref 6–23)
CO2: 26 mEq/L (ref 19–32)
Calcium: 9.3 mg/dL (ref 8.4–10.5)
Chloride: 103 mEq/L (ref 96–112)
Creatinine, Ser: 0.43 mg/dL (ref 0.40–1.20)
GFR: 152.56 mL/min (ref >60.00)
Glucose, Bld: 139 mg/dL — ABNORMAL HIGH (ref 70–99)
Potassium: 4 mEq/L (ref 3.5–5.1)
Sodium: 138 mEq/L (ref 135–145)
Total Bilirubin: 0.7 mg/dL (ref 0.2–1.2)
Total Protein: 7 g/dL (ref 6.0–8.3)

## 2020-03-05 LAB — MICROALBUMIN / CREATININE URINE RATIO
Creatinine,U: 50.1 mg/dL
Microalb Creat Ratio: 1.7 mg/g (ref 0.0–30.0)
Microalb, Ur: 0.8 mg/dL (ref 0.0–1.9)

## 2020-03-05 LAB — HEMOGLOBIN A1C: Hgb A1c MFr Bld: 6.7 % — ABNORMAL HIGH (ref 4.6–6.5)

## 2020-03-05 LAB — LDL CHOLESTEROL, DIRECT: Direct LDL: 148 mg/dL

## 2020-03-05 MED ORDER — ONETOUCH ULTRA 2 W/DEVICE KIT: 1.0000 | PACK | Freq: Two times a day (BID) | 0 refills | Status: DC

## 2020-03-05 MED ORDER — LIDOCAINE 5 % EX PTCH: 2.0000 | MEDICATED_PATCH | CUTANEOUS | 5 refills | Status: DC

## 2020-03-05 MED ORDER — GLUCOSE BLOOD VI STRP: ORAL_STRIP | 1 refills | Status: DC

## 2020-03-05 NOTE — Progress Notes (Signed)
.   History of Present Illness: Patient is a 54 y.o. female who presents to clinic today for follow-up of Diabetes Mellitus II, previously controlled with neuropathy.  Patient currently on medication regimen of Synjardy and Trulicity.Megan Murray taking medications as directed. Endorses tolerating medications well overall.  Was recently evaluated by ophthalmology and sent to a retinal specialist due to some slight changes noted.  Has been receiving injections with improvement.  Patient states she read about rare side effect of vision change with Trulicity.  Would like to discuss a change to Ozempic or other medication. denies numbness or tingling of hands or feet.  Notes good urinary output.  Denies polyphagia, polyuria or polydipsia.  Endorses checking blood glucose as directed.  Fasting glucose averaging 120s to 140s.  Notes she is in need of a new glucometer.  Patient also with history of fibromyalgia and myofascial pain syndrome, currently followed by pain specialist (Dr. Dagoberto Ligas).  Is currently on a regimen of tramadol and Lidoderm patches.  Has been receiving trigger point injections every month.  Notes with these her pain is down to will be 1-2 out of 10, which is the most pain relief she is having quite some time.  Patient states she is very happy with her care at specialist office but her visits are costing her $300 each which she cannot afford.  Has been appealing with her insurance to get better coverage for these visits.  Until she can get this worked out is wanting to know if we will temporarily take over her medications.  Latest Maintenance: A1C --  Lab Results  Component Value Date   HGBA1C 6.9 (H) 04/26/2019   Diabetic Eye Exam --  Is scheduled for this Friday with her retinal specialist for follow-up.  Urine Microalbumin -- overdue.  Foot Exam -- up-to-date    Past Medical History:  Diagnosis Date   Asthma    Diabetes mellitus without complication ([place])    Dyslipidemia, goal  to be determined    High triglycerides and LDL   Fatty liver 05/16/2018   By CT scan   Fibromyalgia    Hyperlipidemia    Hypertension    Major depression, recurrent, chronic ([place]) 04/25/2018   Dr. Toy Care   Obesity due to excess calories without serious comorbidity    Osteoarthritis     Current Outpatient Medications on File Prior to Visit  Medication Sig Dispense Refill   albuterol (VENTOLIN HFA) 108 (90 Base) MCG/ACT inhaler Inhale 2 puffs into the lungs every 6 (six) hours as needed for wheezing or shortness of breath. 6.7 g 2   diclofenac sodium (VOLTAREN) 1 % GEL APP 0.5 TO 1 GRAM EXT AA QID PRN     Dulaglutide (TRULICITY) 1.5 LZ/7.6BH SOPN INJECT 1.5 MG(0.5MLS) UNDER THE SKIN ONCE A WEEK 12 pen 1   ezetimibe (ZETIA) 10 MG tablet TAKE 1 TABLET(10 MG) BY MOUTH DAILY 90 tablet 2   FLUoxetine (PROZAC) 40 MG capsule TAKE 1 CAPSULE(40 MG) BY MOUTH DAILY 30 capsule 3   lidocaine (LIDODERM) 5 % Place 2 patches onto the skin daily. Suggest neck/shoulders- Remove & Discard patch within 12 hours or as directed by MD 60 patch 5   pantoprazole (PROTONIX) 40 MG tablet TAKE 1 TABLET(40 MG) BY MOUTH DAILY 90 tablet 1   SYNJARDY XR 25-1000 MG TB24 TAKE 1 TABLET BY MOUTH DAILY 90 tablet 1   traMADol (ULTRAM) 50 MG tablet TAKE 1 TABLET BY MOUTH EVERY 8 HOURS AS NEEDED FOR MODERATE PAIN  90 tablet 3   VASCEPA 0.5 g CAPS TAKE 1 CAPSULE BY MOUTH AT BEDTIME 30 capsule 11   zolpidem (AMBIEN) 10 MG tablet TAKE 1 TABLET(10 MG) BY MOUTH AT BEDTIME AS NEEDED FOR SLEEP 30 tablet 1   [DISCONTINUED] diclofenac (VOLTAREN) 75 MG EC tablet TAKE 1 TABLET BY MOUTH TWICE DAILY 60 tablet 5   No current facility-administered medications on file prior to visit.    Allergies  Allergen Reactions   Atorvastatin    Cholestyramine     Gi upset   Crestor [Rosuvastatin Calcium]    Fenofibrate Other (See Comments)    STOMACH CRAMPS,AND DIARRHEA   Simvastatin    Soy Allergy    Statins     GI  upset; myalgias   Codeine Palpitations    All codeine   Meloxicam Rash    Family History  Problem Relation Age of Onset   COPD Mother        Died at 60   Heart failure Mother        Not sure of details   Esophageal cancer Father        Survived cancer treatment and surgery   Drug abuse Father    Cirrhosis Father        NAFLD   Healthy Sister    Diabetes Brother    Depression Brother    Heart attack Brother    Heart disease Brother    Diabetes Maternal Grandmother    Arthritis Maternal Grandmother    Cancer Paternal Grandmother    Hearing loss Paternal Grandmother    Liver disease Paternal Grandfather    Diabetes Maternal Grandfather     Social History   Socioeconomic History   Marital status: Married    Spouse name: Not on file   Number of children: 1   Years of education: Not on file   Highest education level: Not on file  Occupational History   Occupation: disabled due to fibromyalgia and depression  Tobacco Use   Smoking status: Never Smoker   Smokeless tobacco: Never Used  Scientific laboratory technician Use: Never used  Substance and Sexual Activity   Alcohol use: No   Drug use: No   Sexual activity: Yes  Other Topics Concern   Not on file  Social History Narrative   Not on file   Social Determinants of Health   Financial Resource Strain:    Difficulty of Paying Living Expenses: Not on file  Food Insecurity:    Worried About Charity fundraiser in the Last Year: Not on file   [employer] of Food in the Last Year: Not on file  Transportation Needs:    Lack of Transportation (Medical): Not on file   Lack of Transportation (Non-Medical): Not on file  Physical Activity:    Days of Exercise per Week: Not on file   Minutes of Exercise per Session: Not on file  Stress:    Feeling of Stress : Not on file  Social Connections:    Frequency of Communication with Friends and Family: Not on file   Frequency of Social Gatherings  with Friends and Family: Not on file   Attends Religious Services: Not on file   Active Member of Clubs or Organizations: Not on file   Attends Archivist Meetings: Not on file   Marital Status: Not on file   Review of Systems: Pertinent ROS are listed in HPI  Physical Examination: BP 110/80    Pulse 77  Temp 98 F (36.7 C) (Temporal)    Resp 16    Ht $R'5\' 1"'ZF$  (1.549 m)    Wt 180 lb (81.6 kg)    SpO2 98%    BMI 34.01 kg/m  General appearance: alert, cooperative, appears stated age and no distress Head: Normocephalic, without obvious abnormality, atraumatic Lungs: clear to auscultation bilaterally Heart: regular rate and rhythm, S1, S2 normal, no murmur, click, rub or gallop Skin: Skin color, texture, turgor normal. No rashes or lesions Neurologic: Alert and oriented X 3, normal strength and tone. Normal symmetric reflexes. Normal coordination and gait  Assessment/Plan: 1. Type 2 diabetes mellitus without complication, without [place]-term current use of insulin (HCC) Taking medications as directed and overall tolerating well.  Discussed with her unlikely that the Trulicity is contributing to vision changes but if she has concerns, as [place] as insurance will cover and labs are stable today we can switch to Ozempic.  Repeat A1c, CMP, lipid panel.  We will also obtain urine microalbumin/creatinine ratio.  Eye examinations are up-to-date.  Follow-up scheduled for Friday with retinal specialist.  New diabetic testing supplies sent to pharmacy.  Again will alter treatment regimen based on results. - Hemoglobin A1c - Microalbumin / creatinine urine ratio - Comprehensive metabolic panel - Lipid panel - Blood Glucose Monitoring Suppl (ONE TOUCH ULTRA 2) w/Device KIT; 1 kit by Does not apply route 2 (two) times daily.  Dispense: 1 kit; Refill: 0 - glucose blood (ONE TOUCH ULTRA TEST) test strip; for testing twice daily  Dispense: 100 each; Refill: 1  2. Major depression, recurrent,  chronic ([place]) Patient is doing very well.  We will continue current regimen.  3. Myofascial pain Hopefully patient's insurance appeal will go through as she seems very happy with the care from her pain management specialist, having substantial relief of pain.  I am very happy for her as a something she has struggled with for quite some time and has a detrimental effect on her mood and other health conditions including diabetes.  Hopefully she will be able to remain there.  For now we will take over her medications.   This visit occurred during the SARS-CoV-2 public health emergency.  Safety protocols were in place, including screening questions prior to the visit, additional usage of staff PPE, and extensive cleaning of exam room while observing appropriate contact time as indicated for disinfecting solutions.

## 2020-03-05 NOTE — Patient Instructions (Signed)
Please go to the lab today for blood work.  I will call you with your results. We will alter treatment regimen(s) if indicated by your results.   I will take over medications for now. Continue with the insurance appeal. I am going to reach out to Dr. Berline Chough.   Hang in there!   Diabetes Mellitus and Nutrition, Adult When you have diabetes (diabetes mellitus), it is very important to have healthy eating habits because your blood sugar (glucose) levels are greatly affected by what you eat and drink. Eating healthy foods in the appropriate amounts, at about the same times every day, can help you:  Control your blood glucose.  Lower your risk of heart disease.  Improve your blood pressure.  Reach or maintain a healthy weight. Every person with diabetes is different, and each person has different needs for a meal plan. Your health care provider may recommend that you work with a diet and nutrition specialist (dietitian) to make a meal plan that is best for you. Your meal plan may vary depending on factors such as:  The calories you need.  The medicines you take.  Your weight.  Your blood glucose, blood pressure, and cholesterol levels.  Your activity level.  Other health conditions you have, such as heart or kidney disease. How do carbohydrates affect me? Carbohydrates, also called carbs, affect your blood glucose level more than any other type of food. Eating carbs naturally raises the amount of glucose in your blood. Carb counting is a method for keeping track of how many carbs you eat. Counting carbs is important to keep your blood glucose at a healthy level, especially if you use insulin or take certain oral diabetes medicines. It is important to know how many carbs you can safely have in each meal. This is different for every person. Your dietitian can help you calculate how many carbs you should have at each meal and for each snack. Foods that contain carbs include:  Bread,  cereal, rice, pasta, and crackers.  Potatoes and corn.  Peas, beans, and lentils.  Milk and yogurt.  Fruit and juice.  Desserts, such as cakes, cookies, ice cream, and candy. How does alcohol affect me? Alcohol can cause a sudden decrease in blood glucose (hypoglycemia), especially if you use insulin or take certain oral diabetes medicines. Hypoglycemia can be a life-threatening condition. Symptoms of hypoglycemia (sleepiness, dizziness, and confusion) are similar to symptoms of having too much alcohol. If your health care provider says that alcohol is safe for you, follow these guidelines:  Limit alcohol intake to no more than 1 drink per day for nonpregnant women and 2 drinks per day for men. One drink equals 12 oz of beer, 5 oz of wine, or 1 oz of hard liquor.  Do not drink on an empty stomach.  Keep yourself hydrated with water, diet soda, or unsweetened iced tea.  Keep in mind that regular soda, juice, and other mixers may contain a lot of sugar and must be counted as carbs. What are tips for following this plan?  Reading food labels  Start by checking the serving size on the "Nutrition Facts" label of packaged foods and drinks. The amount of calories, carbs, fats, and other nutrients listed on the label is based on one serving of the item. Many items contain more than one serving per package.  Check the total grams (g) of carbs in one serving. You can calculate the number of servings of carbs in one serving by  dividing the total carbs by 15. For example, if a food has 30 g of total carbs, it would be equal to 2 servings of carbs.  Check the number of grams (g) of saturated and trans fats in one serving. Choose foods that have low or no amount of these fats.  Check the number of milligrams (mg) of salt (sodium) in one serving. Most people should limit total sodium intake to less than 2,300 mg per day.  Always check the nutrition information of foods labeled as "low-fat" or  "nonfat". These foods may be higher in added sugar or refined carbs and should be avoided.  Talk to your dietitian to identify your daily goals for nutrients listed on the label. Shopping  Avoid buying canned, premade, or processed foods. These foods tend to be high in fat, sodium, and added sugar.  Shop around the outside edge of the grocery store. This includes fresh fruits and vegetables, bulk grains, fresh meats, and fresh dairy. Cooking  Use low-heat cooking methods, such as baking, instead of high-heat cooking methods like deep frying.  Cook using healthy oils, such as olive, canola, or sunflower oil.  Avoid cooking with butter, cream, or high-fat meats. Meal planning  Eat meals and snacks regularly, preferably at the same times every day. Avoid going [place] periods of time without eating.  Eat foods high in fiber, such as fresh fruits, vegetables, beans, and whole grains. Talk to your dietitian about how many servings of carbs you can eat at each meal.  Eat 4-6 ounces (oz) of lean protein each day, such as lean meat, chicken, fish, eggs, or tofu. One oz of lean protein is equal to: ? 1 oz of meat, chicken, or fish. ? 1 egg. ?  cup of tofu.  Eat some foods each day that contain healthy fats, such as avocado, nuts, seeds, and fish. Lifestyle  Check your blood glucose regularly.  Exercise regularly as told by your health care provider. This may include: ? 150 minutes of moderate-intensity or vigorous-intensity exercise each week. This could be brisk walking, biking, or water aerobics. ? Stretching and doing strength exercises, such as yoga or weightlifting, at least 2 times a week.  Take medicines as told by your health care provider.  Do not use any products that contain nicotine or tobacco, such as cigarettes and e-cigarettes. If you need help quitting, ask your health care provider.  Work with a Veterinary surgeon or diabetes educator to identify strategies to manage stress and  any emotional and social challenges. Questions to ask a health care provider  Do I need to meet with a diabetes educator?  Do I need to meet with a dietitian?  What number can I call if I have questions?  When are the best times to check my blood glucose? Where to find more information:  American Diabetes Association: diabetes.org  Academy of Nutrition and Dietetics: www.eatright.[employer] of Diabetes and Digestive and Kidney Diseases (NIH): CarFlippers.tn Summary  A healthy meal plan will help you control your blood glucose and maintain a healthy lifestyle.  Working with a diet and nutrition specialist (dietitian) can help you make a meal plan that is best for you.  Keep in mind that carbohydrates (carbs) and alcohol have immediate effects on your blood glucose levels. It is important to count carbs and to use alcohol carefully. This information is not intended to replace advice given to you by your health care provider. Make sure you discuss any questions you have  with your health care provider. Document Revised: 06/10/2017 Document Reviewed: 08/02/2016 Elsevier Patient Education  2020 [employer].

## 2020-03-07 ENCOUNTER — Other Ambulatory Visit: Payer: Self-pay | Admitting: Physician Assistant

## 2020-03-07 ENCOUNTER — Other Ambulatory Visit: Payer: Self-pay | Admitting: Emergency Medicine

## 2020-03-07 DIAGNOSIS — E119 Type 2 diabetes mellitus without complications: Secondary | ICD-10-CM

## 2020-03-07 MED ORDER — OZEMPIC (0.25 OR 0.5 MG/DOSE) 2 MG/1.5ML ~~LOC~~ SOPN: 0.5000 mg | PEN_INJECTOR | SUBCUTANEOUS | 6 refills | Status: DC

## 2020-03-14 ENCOUNTER — Other Ambulatory Visit: Payer: Self-pay | Admitting: Physician Assistant

## 2020-03-14 DIAGNOSIS — F339 Major depressive disorder, recurrent, unspecified: Secondary | ICD-10-CM

## 2020-04-14 ENCOUNTER — Other Ambulatory Visit: Payer: Self-pay | Admitting: Physician Assistant

## 2020-04-14 DIAGNOSIS — Z1231 Encounter for screening mammogram for malignant neoplasm of breast: Secondary | ICD-10-CM

## 2020-04-21 ENCOUNTER — Other Ambulatory Visit: Payer: Self-pay | Admitting: Physician Assistant

## 2020-04-21 ENCOUNTER — Other Ambulatory Visit: Payer: Self-pay

## 2020-04-21 DIAGNOSIS — F339 Major depressive disorder, recurrent, unspecified: Secondary | ICD-10-CM

## 2020-04-21 NOTE — Telephone Encounter (Signed)
Ambien 30 tabs x1 at bed time for sleep  CSC last signed 06/21/2018  Last refill 01/16/2020

## 2020-04-22 ENCOUNTER — Other Ambulatory Visit: Payer: Self-pay | Admitting: Physician Assistant

## 2020-04-22 DIAGNOSIS — F339 Major depressive disorder, recurrent, unspecified: Secondary | ICD-10-CM

## 2020-04-22 DIAGNOSIS — F5101 Primary insomnia: Secondary | ICD-10-CM

## 2020-04-22 NOTE — Telephone Encounter (Signed)
Patient called back today requesting a refill for the Ambien. Was requested on Sunday and patient is out of medication.  Ambien last rx 02/18/20 #30 LOV: 03/05/20 Diabetes  Can you fill in PCP absence

## 2020-04-22 NOTE — Telephone Encounter (Signed)
Last OV 03/05/20 ZOlpidem last filled 04/21/20 #30 with 0

## 2020-05-02 ENCOUNTER — Other Ambulatory Visit: Payer: Self-pay | Admitting: Pulmonary Disease

## 2020-05-02 DIAGNOSIS — J9801 Acute bronchospasm: Secondary | ICD-10-CM

## 2020-05-02 DIAGNOSIS — R058 Other specified cough: Secondary | ICD-10-CM

## 2020-05-05 ENCOUNTER — Other Ambulatory Visit: Payer: Self-pay | Admitting: Physician Assistant

## 2020-05-05 DIAGNOSIS — K219 Gastro-esophageal reflux disease without esophagitis: Secondary | ICD-10-CM

## 2020-05-09 ENCOUNTER — Other Ambulatory Visit: Payer: Self-pay | Admitting: Physician Assistant

## 2020-05-09 ENCOUNTER — Ambulatory Visit: Payer: Medicare Other

## 2020-05-09 DIAGNOSIS — E118 Type 2 diabetes mellitus with unspecified complications: Secondary | ICD-10-CM

## 2020-05-17 ENCOUNTER — Other Ambulatory Visit: Payer: Self-pay | Admitting: Family Medicine

## 2020-05-17 DIAGNOSIS — F5101 Primary insomnia: Secondary | ICD-10-CM

## 2020-05-19 NOTE — Telephone Encounter (Signed)
LFD 04/22/20 #30 with no refills LOV 03/05/20 NOV none

## 2020-05-21 ENCOUNTER — Other Ambulatory Visit: Payer: Self-pay | Admitting: Physician Assistant

## 2020-05-21 DIAGNOSIS — K219 Gastro-esophageal reflux disease without esophagitis: Secondary | ICD-10-CM

## 2020-05-21 NOTE — Telephone Encounter (Signed)
Patient is requesting advanced refills on the following to prevent any  missed doses.  Protonix 40mg   90 tabs 1 refill with zero remaining  Last refill 04/28/2020

## 2020-06-12 ENCOUNTER — Other Ambulatory Visit: Payer: Self-pay | Admitting: Physician Assistant

## 2020-06-12 DIAGNOSIS — E119 Type 2 diabetes mellitus without complications: Secondary | ICD-10-CM

## 2020-06-24 ENCOUNTER — Ambulatory Visit: Payer: Medicare Other

## 2020-07-13 ENCOUNTER — Other Ambulatory Visit: Payer: Self-pay | Admitting: Physical Medicine and Rehabilitation

## 2020-07-13 DIAGNOSIS — M159 Polyosteoarthritis, unspecified: Secondary | ICD-10-CM

## 2020-07-13 DIAGNOSIS — M797 Fibromyalgia: Secondary | ICD-10-CM

## 2020-07-13 DIAGNOSIS — M8949 Other hypertrophic osteoarthropathy, multiple sites: Secondary | ICD-10-CM

## 2020-07-14 ENCOUNTER — Other Ambulatory Visit: Payer: Self-pay | Admitting: Physician Assistant

## 2020-07-14 DIAGNOSIS — F5101 Primary insomnia: Secondary | ICD-10-CM

## 2020-07-14 MED ORDER — ZOLPIDEM TARTRATE 10 MG PO TABS: ORAL_TABLET | ORAL | 0 refills | Status: DC

## 2020-07-14 NOTE — Telephone Encounter (Signed)
LFD 05/19/20 #30 with 1 refill LOV 03/05/20 NOV none

## 2020-07-22 ENCOUNTER — Other Ambulatory Visit: Payer: Self-pay | Admitting: Physician Assistant

## 2020-07-31 ENCOUNTER — Telehealth: Payer: Self-pay | Admitting: Physician Assistant

## 2020-07-31 DIAGNOSIS — E119 Type 2 diabetes mellitus without complications: Secondary | ICD-10-CM

## 2020-07-31 MED ORDER — OZEMPIC (0.25 OR 0.5 MG/DOSE) 2 MG/1.5ML ~~LOC~~ SOPN: 0.5000 mg | PEN_INJECTOR | SUBCUTANEOUS | 6 refills | Status: DC

## 2020-07-31 NOTE — Telephone Encounter (Signed)
Spoke with patient about sending her Ozempic medication to [facility] in [place]. Patient states she didn't want to drive to the [place] on [address]. [facility] [place].

## 2020-07-31 NOTE — Telephone Encounter (Signed)
Pt called in stating that [facility] is out of the Ozempic and they don't know when they will get it in. She states that she hasn't had her medication in almost 2wks  Please advise on what pt should do   Pt can be reached at the home #

## 2020-08-02 ENCOUNTER — Other Ambulatory Visit: Payer: Self-pay | Admitting: Physician Assistant

## 2020-08-02 DIAGNOSIS — F339 Major depressive disorder, recurrent, unspecified: Secondary | ICD-10-CM

## 2020-08-05 ENCOUNTER — Encounter: Payer: Self-pay | Admitting: Physician Assistant

## 2020-08-05 DIAGNOSIS — E119 Type 2 diabetes mellitus without complications: Secondary | ICD-10-CM

## 2020-08-05 MED ORDER — OZEMPIC (0.25 OR 0.5 MG/DOSE) 2 MG/1.5ML ~~LOC~~ SOPN: 0.5000 mg | PEN_INJECTOR | SUBCUTANEOUS | 1 refills | Status: DC

## 2020-08-12 ENCOUNTER — Other Ambulatory Visit: Payer: Self-pay | Admitting: Physician Assistant

## 2020-08-12 DIAGNOSIS — E118 Type 2 diabetes mellitus with unspecified complications: Secondary | ICD-10-CM

## 2020-08-13 ENCOUNTER — Other Ambulatory Visit: Payer: Self-pay | Admitting: Physician Assistant

## 2020-08-13 DIAGNOSIS — E118 Type 2 diabetes mellitus with unspecified complications: Secondary | ICD-10-CM

## 2020-08-14 ENCOUNTER — Other Ambulatory Visit: Payer: Self-pay | Admitting: Physician Assistant

## 2020-08-14 DIAGNOSIS — F5101 Primary insomnia: Secondary | ICD-10-CM

## 2020-08-15 NOTE — Telephone Encounter (Signed)
Patient called in today stating that she is out of this medication and would like to have it refilled today.

## 2020-08-15 NOTE — Telephone Encounter (Signed)
Ambien last rx 07/14/20 #30 LOV: 03/05/20 DM CSC: 06/21/18  TOC with Ramon Dredge 11/03/20

## 2020-08-20 DIAGNOSIS — M79676 Pain in unspecified toe(s): Secondary | ICD-10-CM | POA: Insufficient documentation

## 2020-08-20 HISTORY — DX: Pain in unspecified toe(s): M79.676

## 2020-09-12 ENCOUNTER — Other Ambulatory Visit: Payer: Self-pay | Admitting: Family Medicine

## 2020-09-13 NOTE — Telephone Encounter (Signed)
requesting refills Rx Diclofenac  The original prescription was discontinued on 04/26/2019  Please advise

## 2020-09-15 NOTE — Telephone Encounter (Signed)
She is not my patient. She transferred from me.  Not sure who is covering for cody's refill requests.  May need appointment with someone else if for new problem.

## 2020-09-16 ENCOUNTER — Telehealth: Payer: Self-pay

## 2020-09-16 NOTE — Telephone Encounter (Signed)
Patient is requesting the following:  Diclofenac sodium 75mg  tabs Last visit:03/05/20 Next visit:11/03/20 w/Edward Saguier Last refill: Patient is currently using the gel

## 2020-09-16 NOTE — Telephone Encounter (Signed)
Left detail massage, need to call PCP for refills

## 2020-09-19 ENCOUNTER — Other Ambulatory Visit: Payer: Self-pay

## 2020-09-19 DIAGNOSIS — E119 Type 2 diabetes mellitus without complications: Secondary | ICD-10-CM

## 2020-09-19 MED ORDER — ONETOUCH ULTRA VI STRP: ORAL_STRIP | 0 refills | Status: DC

## 2020-09-25 MED ORDER — DICLOFENAC SODIUM 75 MG PO TBEC: 75.0000 mg | DELAYED_RELEASE_TABLET | Freq: Two times a day (BID) | ORAL | 1 refills | Status: DC

## 2020-09-25 NOTE — Telephone Encounter (Signed)
Spoke with patient and patient stated that she does tolerate the Volatren gel well. She also states that she has been on the Diclofenac sodium for some time now.

## 2020-09-25 NOTE — Addendum Note (Signed)
Addended by: Worthy Rancher B on: 09/25/2020 12:28 PM   Modules accepted: Orders

## 2020-10-16 ENCOUNTER — Other Ambulatory Visit: Payer: Self-pay | Admitting: Family

## 2020-10-16 DIAGNOSIS — F5101 Primary insomnia: Secondary | ICD-10-CM

## 2020-10-22 NOTE — Telephone Encounter (Signed)
Patient called back needing this prescription - her new doctors appointment is not till May

## 2020-10-29 ENCOUNTER — Other Ambulatory Visit: Payer: Self-pay | Admitting: Family Medicine

## 2020-11-03 ENCOUNTER — Encounter: Payer: Self-pay | Admitting: Medical

## 2020-11-03 ENCOUNTER — Ambulatory Visit (INDEPENDENT_AMBULATORY_CARE_PROVIDER_SITE_OTHER): Payer: Medicare Other | Admitting: Medical

## 2020-11-03 ENCOUNTER — Other Ambulatory Visit: Payer: Self-pay

## 2020-11-03 VITALS — BP 135/85 | HR 71 | Temp 98.1°F | Resp 18 | Ht 61.0 in | Wt 183.0 lb

## 2020-11-03 DIAGNOSIS — F339 Major depressive disorder, recurrent, unspecified: Secondary | ICD-10-CM

## 2020-11-03 DIAGNOSIS — F5101 Primary insomnia: Secondary | ICD-10-CM | POA: Diagnosis not present

## 2020-11-03 DIAGNOSIS — I10 Essential (primary) hypertension: Secondary | ICD-10-CM

## 2020-11-03 DIAGNOSIS — M797 Fibromyalgia: Secondary | ICD-10-CM

## 2020-11-03 DIAGNOSIS — M7918 Myalgia, other site: Secondary | ICD-10-CM

## 2020-11-03 DIAGNOSIS — E785 Hyperlipidemia, unspecified: Secondary | ICD-10-CM | POA: Diagnosis not present

## 2020-11-03 DIAGNOSIS — K219 Gastro-esophageal reflux disease without esophagitis: Secondary | ICD-10-CM

## 2020-11-03 DIAGNOSIS — E119 Type 2 diabetes mellitus without complications: Secondary | ICD-10-CM | POA: Diagnosis not present

## 2020-11-03 DIAGNOSIS — Z79899 Other long term (current) drug therapy: Secondary | ICD-10-CM

## 2020-11-03 DIAGNOSIS — G894 Chronic pain syndrome: Secondary | ICD-10-CM

## 2020-11-03 LAB — COMPREHENSIVE METABOLIC PANEL
ALT: 18 U/L (ref 0–35)
AST: 14 U/L (ref 0–37)
Albumin: 4.4 g/dL (ref 3.5–5.2)
Alkaline Phosphatase: 94 U/L (ref 39–117)
BUN: 10 mg/dL (ref 6–23)
CO2: 24 mEq/L (ref 19–32)
Calcium: 9.4 mg/dL (ref 8.4–10.5)
Chloride: 104 mEq/L (ref 96–112)
Creatinine, Ser: 0.46 mg/dL (ref 0.40–1.20)
GFR: 107.69 mL/min (ref >60.00)
Glucose, Bld: 156 mg/dL — ABNORMAL HIGH (ref 70–99)
Potassium: 4 mEq/L (ref 3.5–5.1)
Sodium: 139 mEq/L (ref 135–145)
Total Bilirubin: 0.7 mg/dL (ref 0.2–1.2)
Total Protein: 7.2 g/dL (ref 6.0–8.3)

## 2020-11-03 LAB — LIPID PANEL
Cholesterol: 226 mg/dL — ABNORMAL HIGH (ref 0–200)
HDL: 43.7 mg/dL (ref >39.00)
NonHDL: 182.48
Total CHOL/HDL Ratio: 5
Triglycerides: 292 mg/dL — ABNORMAL HIGH (ref 0.0–149.0)
VLDL: 58.4 mg/dL — ABNORMAL HIGH (ref 0.0–40.0)

## 2020-11-03 LAB — LDL CHOLESTEROL, DIRECT: Direct LDL: 148 mg/dL

## 2020-11-03 MED ORDER — OZEMPIC (1 MG/DOSE) 2 MG/1.5ML ~~LOC~~ SOPN: 1.0000 mg | PEN_INJECTOR | SUBCUTANEOUS | 1 refills | Status: DC

## 2020-11-03 NOTE — Addendum Note (Signed)
Addended by: Rosita Kea on: 11/03/2020 11:34 AM   Modules accepted: Orders

## 2020-11-03 NOTE — Addendum Note (Signed)
Addended by: Maximino Sarin on: 11/03/2020 10:56 AM   Modules accepted: Orders

## 2020-11-03 NOTE — Addendum Note (Signed)
Addended by: Gwenevere Abbot on: 11/03/2020 11:05 AM   Modules accepted: Orders

## 2020-11-03 NOTE — Progress Notes (Signed)
Subjective:    Patient ID: Megan Murray, female    DOB: May 24, 1966, 55 y.o.   MRN: [id number]  HPI  Pt in for first time here but former Pt Megan Murray.   Pt has hx of high cholesterol. Pt is on zetia and krill oil. Pt states she had side effects to statins. Last cholesterol was in august 2021.   Pt has diabetes. Last a1c was 6.7 with our office.  Pt is on ozempic 0.5 mg once a week. Pt states 2.5 months ago her a1c was 7.6 at [facility]. Prior  was on trulicity. Also on synjardy.     Pt has depression and anxiety. Pt is on prozac 40 mg daily. On medication for about a year and half. Pt states feels like emotions are mildly blunted. On med for about 1.5 years.   Pt has hx of heart burn. Pt states heart burn controlled. No longer having to use protonix. Occasionally takes omeprazole.   Pt has hx of insomnia. Pt is on ambien.   Hx of fibromyalgia and some fascial pain history.  Some myofascial pain. I review physicatrist notes. Pt has been getting 90 tabs rx of tramadol. Most of time she takes one tab daily. Occasionally 2 tabs daily. Pt states in past tried cymbalta, gabapentin and lyrica. All either did not work or did not work.     Review of Systems  Constitutional: Negative for chills and fatigue.  HENT: Negative for congestion and ear discharge.   Eyes: Negative for pain and visual disturbance.  Respiratory: Negative for cough, chest tightness, shortness of breath and wheezing.   Cardiovascular: Negative for chest pain and palpitations.  Gastrointestinal: Negative for abdominal pain, diarrhea, rectal pain and vomiting.  Genitourinary: Negative for dysuria.  Musculoskeletal: Negative for back pain and gait problem.  Skin: Negative for pallor and rash.  Neurological: Negative for dizziness, syncope, speech difficulty, weakness, numbness and headaches.  Hematological: Negative for adenopathy. Does not bruise/bleed easily.  Psychiatric/Behavioral: Negative for behavioral  problems, decreased concentration, dysphoric mood and suicidal ideas. The patient is not nervous/anxious and is not hyperactive.     Past Medical History:  Diagnosis Date  . Asthma   . Diabetes mellitus without complication ([place])   . Dyslipidemia, goal to be determined    High triglycerides and LDL  . Fatty liver 05/16/2018   By CT scan  . Fibromyalgia   . Hyperlipidemia   . Hypertension   . Major depression, recurrent, chronic ([place]) 04/25/2018   Dr. Toy Care  . Obesity due to excess calories without serious comorbidity   . Osteoarthritis      Social History   Socioeconomic History  . Marital status: Married    Spouse name: Not on file  . Number of children: 1  . Years of education: Not on file  . Highest education level: Not on file  Occupational History  . Occupation: disabled due to fibromyalgia and depression  Tobacco Use  . Smoking status: Never Smoker  . Smokeless tobacco: Never Used  Vaping Use  . Vaping Use: Never used  Substance and Sexual Activity  . Alcohol use: No  . Drug use: No  . Sexual activity: Yes  Other Topics Concern  . Not on file  Social History Narrative  . Not on file   Social Determinants of Health   Financial Resource Strain: Not on file  Food Insecurity: Not on file  Transportation Needs: Not on file  Physical Activity: Not on file  Stress:  Not on file  Social Connections: Not on file  Intimate Partner Violence: Not on file    Past Surgical History:  Procedure Laterality Date  . ABDOMINAL HYSTERECTOMY    . REDUCTION MAMMAPLASTY      Family History  Problem Relation Age of Onset  . COPD Mother        Died at 42  . Heart failure Mother        Not sure of details  . Esophageal cancer Father        Survived cancer treatment and surgery  . Drug abuse Father   . Cirrhosis Father        NAFLD  . Healthy Sister   . Diabetes Brother   . Depression Brother   . Heart attack Brother   . Heart disease Brother   . Diabetes Maternal  Grandmother   . Arthritis Maternal Grandmother   . Cancer Paternal Grandmother   . Hearing loss Paternal Grandmother   . Liver disease Paternal Grandfather   . Diabetes Maternal Grandfather     Allergies  Allergen Reactions  . Atorvastatin   . Cholestyramine     Gi upset  . Crestor [Rosuvastatin Calcium]   . Fenofibrate Other (See Comments)    STOMACH CRAMPS,AND DIARRHEA  . Simvastatin   . Soy Allergy   . Statins     GI upset; myalgias  . Codeine Palpitations    All codeine  . Meloxicam Rash    Current Outpatient Medications on File Prior to Visit  Medication Sig Dispense Refill  . albuterol (VENTOLIN HFA) 108 (90 Base) MCG/ACT inhaler Inhale 2 puffs into the lungs every 6 (six) hours as needed for wheezing or shortness of breath. 6.7 g 2  . Blood Glucose Monitoring Suppl (ONE TOUCH ULTRA 2) w/Device KIT 1 kit by Does not apply route 2 (two) times daily. 1 kit 0  . diclofenac (VOLTAREN) 75 MG EC tablet Take 1 tablet (75 mg total) by mouth 2 (two) times daily. 60 tablet 1  . diclofenac sodium (VOLTAREN) 1 % GEL APP 0.5 TO 1 GRAM EXT AA QID PRN    . ezetimibe (ZETIA) 10 MG tablet TAKE 1 TABLET(10 MG) BY MOUTH DAILY 90 tablet 2  . FLUoxetine (PROZAC) 40 MG capsule TAKE 1 CAPSULE(40 MG) BY MOUTH DAILY 90 capsule 0  . glucose blood (ONETOUCH ULTRA) test strip USE FOR TESTING TWICE DAILY 100 strip 0  . lidocaine (LIDODERM) 5 % Place 2 patches onto the skin daily. Suggest neck/shoulders- Remove & Discard patch within 12 hours or as directed by MD 60 patch 5  . pantoprazole (PROTONIX) 40 MG tablet TAKE 1 TABLET(40 MG) BY MOUTH DAILY 90 tablet 1  . Semaglutide,0.25 or 0.5MG/DOS, (OZEMPIC, 0.25 OR 0.5 MG/DOSE,) 2 MG/1.5ML SOPN Inject 0.5 mg into the skin once a week. 4.5 mL 1  . SYNJARDY XR 25-1000 MG TB24 TAKE 1 TABLET BY MOUTH DAILY 90 tablet 1  . traMADol (ULTRAM) 50 MG tablet TAKE 1 TABLET BY MOUTH EVERY 8 HOURS AS NEEDED FOR MODERATE PAIN 90 tablet 5  . VASCEPA 0.5 g CAPS TAKE 1  CAPSULE BY MOUTH AT BEDTIME 30 capsule 11  . zolpidem (AMBIEN) 10 MG tablet TAKE 1 TABLET(10 MG) BY MOUTH AT BEDTIME AS NEEDED FOR SLEEP 30 tablet 1   No current facility-administered medications on file prior to visit.    BP (!) 160/74   Pulse 71   Temp 98.1 F (36.7 C)   Resp 18   Ht  _0  (1.549 m)   Wt 183 lb (83 kg)   SpO2 96%   BMI 34.58 kg/m       Objective:   Physical Exam  General Mental Status- Alert. General Appearance- Not in acute distress.   Skin General: Color- Normal Color. Moisture- Normal Moisture.  Neck Carotid Arteries- Normal color. Moisture- Normal Moisture. No carotid bruits. No JVD.  Chest and Lung Exam Auscultation: Breath Sounds:-Normal.  Cardiovascular Auscultation:Rythm- Regular. Murmurs & Other Heart Sounds:Auscultation of the heart reveals- No Murmurs.  Abdomen Inspection:-Inspeection Normal. Palpation/Percussion:Note:No mass. Palpation and Percussion of the abdomen reveal- Non Tender, Non Distended + BS, no rebound or guarding.   Neurologic Cranial Nerve exam:- CN III-XII intact(No nystagmus), symmetric smile. Strength:- 5/5 equal and symmetric strength both upper and lower extremities.      Assessment & Plan:  History of diabetes with last A1c 1/2 months ago of 7.6.  Recommend low sugar diet, daily exercise and continue Synjardy.  We will increase your Ozempic to 60m weekly.  A history of GERD and described stable/rare symptoms.  Recommend periodic use of omeprazole.  Healthy diet as well.  History of insomnia.  On Ambien 10 mg daily.  We will have you sign controlled medication contract.  History of depression.  Improved with medication/stable.  We will continue on Prozac 40 mg daily.   History of myofascial pain, fibromyalgia and chronic pain syndrome.  Failed various medications for fibromyalgia.  Various providers have had you on tramadol and it does appear to work.  Have you sign controlled medication contract and gave  UDS today.  History of hyperlipidemia.  Elevated lipids despite use of Zetia, fish oil and low-cholesterol diet.  Unfortunately very side effects with the statins.  Mild elevated blood pressure today.  I want you to check your blood pressure daily over the next week and send me a MyChart update.  Yes over 130/80 then would prescribe low-dose losartan.  Follow-up in 3 months or as needed.  EMackie Pai PA-C   Time spent with patient today was 47  minutes which consisted of chart revdiew, discussing diagnosis, work up treatment and documentation.

## 2020-11-03 NOTE — Patient Instructions (Addendum)
History of diabetes with last A1c 1/2 months ago of 7.6.  Recommend low sugar diet, daily exercise and continue Synjardy.  We will increase your Ozempic to 1mg  weekly.  A history of GERD and described stable/rare symptoms.  Recommend periodic use of omeprazole.  Healthy diet as well.  History of insomnia.  On Ambien 10 mg daily.  We will have you sign controlled medication contract.  History of depression.  Improved with medication/stable.  We will continue on Prozac 40 mg daily.   History of myofascial pain, fibromyalgia and chronic pain syndrome.  Failed various medications for fibromyalgia.  Various providers have had you on tramadol and it does appear to work.  Have you sign controlled medication contract and gave UDS today.  History of hyperlipidemia.  Elevated lipids despite use of Zetia, fish oil and low-cholesterol diet.  Unfortunately very side effects with the statins.  Mild elevated blood pressure today.  I want you to check your blood pressure daily over the next week and send me a MyChart update.  Yes over 130/80 then would prescribe low-dose losartan.  Follow-up in 3 months or as needed.

## 2020-11-04 ENCOUNTER — Encounter: Payer: Self-pay | Admitting: Medical

## 2020-11-04 DIAGNOSIS — E119 Type 2 diabetes mellitus without complications: Secondary | ICD-10-CM

## 2020-11-04 DIAGNOSIS — F5101 Primary insomnia: Secondary | ICD-10-CM

## 2020-11-04 LAB — DRUG MONITORING, PANEL 8 WITH CONFIRMATION, URINE
6 Acetylmorphine: NEGATIVE ng/mL (ref <10)
Alcohol Metabolites: NEGATIVE ng/mL
Amphetamines: NEGATIVE ng/mL (ref <500)
Benzodiazepines: NEGATIVE ng/mL (ref <100)
Buprenorphine, Urine: NEGATIVE ng/mL (ref <5)
Cocaine Metabolite: NEGATIVE ng/mL (ref <150)
Creatinine: 43.8 mg/dL
MDMA: NEGATIVE ng/mL (ref <500)
Marijuana Metabolite: NEGATIVE ng/mL (ref <20)
Opiates: NEGATIVE ng/mL (ref <100)
Oxidant: NEGATIVE ug/mL
Oxycodone: NEGATIVE ng/mL (ref <100)
pH: 5.4 (ref 4.5–9.0)

## 2020-11-04 LAB — DM TEMPLATE

## 2020-11-05 ENCOUNTER — Telehealth: Payer: Self-pay | Admitting: Medical

## 2020-11-05 DIAGNOSIS — M8949 Other hypertrophic osteoarthropathy, multiple sites: Secondary | ICD-10-CM

## 2020-11-05 DIAGNOSIS — M159 Polyosteoarthritis, unspecified: Secondary | ICD-10-CM

## 2020-11-05 DIAGNOSIS — M797 Fibromyalgia: Secondary | ICD-10-CM

## 2020-11-05 MED ORDER — TRAMADOL HCL 50 MG PO TABS: ORAL_TABLET | ORAL | 0 refills | Status: DC

## 2020-11-05 NOTE — Telephone Encounter (Signed)
Rx tramadol sent to pt pharmacy. 

## 2020-11-15 ENCOUNTER — Other Ambulatory Visit: Payer: Self-pay | Admitting: Family Medicine

## 2020-11-15 DIAGNOSIS — E119 Type 2 diabetes mellitus without complications: Secondary | ICD-10-CM

## 2020-11-17 ENCOUNTER — Other Ambulatory Visit: Payer: Self-pay

## 2020-11-17 DIAGNOSIS — F339 Major depressive disorder, recurrent, unspecified: Secondary | ICD-10-CM

## 2020-11-17 MED ORDER — FLUOXETINE HCL 40 MG PO CAPS: ORAL_CAPSULE | ORAL | 0 refills | Status: DC

## 2020-11-18 ENCOUNTER — Telehealth: Payer: Self-pay | Admitting: Medical

## 2020-11-18 ENCOUNTER — Other Ambulatory Visit: Payer: Self-pay | Admitting: Medical

## 2020-11-18 DIAGNOSIS — E119 Type 2 diabetes mellitus without complications: Secondary | ICD-10-CM

## 2020-11-18 DIAGNOSIS — F5101 Primary insomnia: Secondary | ICD-10-CM

## 2020-11-18 MED ORDER — ZOLPIDEM TARTRATE 10 MG PO TABS: ORAL_TABLET | ORAL | 0 refills | Status: DC

## 2020-11-18 MED ORDER — EZETIMIBE 10 MG PO TABS
ORAL_TABLET | ORAL | 2 refills | Status: DC
Start: 2020-11-18 — End: 2021-05-25

## 2020-11-18 MED ORDER — ONETOUCH ULTRA VI STRP: ORAL_STRIP | 0 refills | Status: DC

## 2020-11-18 NOTE — Telephone Encounter (Signed)
Rx ambien sent to pt pharmacy. 

## 2020-11-18 NOTE — Addendum Note (Signed)
Addended by: Maximino Sarin on: 11/18/2020 01:50 PM   Modules accepted: Orders

## 2020-11-18 NOTE — Telephone Encounter (Signed)
Please send in patient's ambien , I have sent in the other medications

## 2020-12-27 ENCOUNTER — Other Ambulatory Visit: Payer: Self-pay | Admitting: Medical

## 2021-01-07 ENCOUNTER — Other Ambulatory Visit: Payer: Self-pay | Admitting: Medical

## 2021-01-07 DIAGNOSIS — E119 Type 2 diabetes mellitus without complications: Secondary | ICD-10-CM

## 2021-01-19 ENCOUNTER — Other Ambulatory Visit: Payer: Self-pay | Admitting: Family

## 2021-01-19 DIAGNOSIS — F5101 Primary insomnia: Secondary | ICD-10-CM

## 2021-01-20 ENCOUNTER — Other Ambulatory Visit: Payer: Self-pay | Admitting: Medical

## 2021-01-20 DIAGNOSIS — F5101 Primary insomnia: Secondary | ICD-10-CM

## 2021-01-21 ENCOUNTER — Other Ambulatory Visit: Payer: Self-pay | Admitting: Medical

## 2021-01-21 DIAGNOSIS — F5101 Primary insomnia: Secondary | ICD-10-CM

## 2021-01-21 DIAGNOSIS — M8949 Other hypertrophic osteoarthropathy, multiple sites: Secondary | ICD-10-CM

## 2021-01-21 DIAGNOSIS — M159 Polyosteoarthritis, unspecified: Secondary | ICD-10-CM

## 2021-01-21 DIAGNOSIS — M797 Fibromyalgia: Secondary | ICD-10-CM

## 2021-01-21 NOTE — Telephone Encounter (Addendum)
Requesting: ambien Contract:11/11/2020 UDS:11/03/2020 Last Visit:11/03/2020 Next Visit:02/02/2021 Last Refill:11/18/2020  Please Advise   Refill today and ask pt to follow up in one month. Esperanza Richters, PA-C

## 2021-01-22 ENCOUNTER — Encounter: Payer: Self-pay | Admitting: Medical

## 2021-01-22 MED ORDER — TRAMADOL HCL 50 MG PO TABS: ORAL_TABLET | ORAL | 0 refills | Status: DC

## 2021-01-22 NOTE — Telephone Encounter (Addendum)
Requesting: tramadol 50mg   Contract: 11/03/2020 UDS: 11/03/2020 Last Visit: 11/03/2020 Next Visit: 02/02/2021 Last Refill: 11/05/2020 #30 and 0RF  Please Advise  Refilling today. Have pt follow up in august.  Thanks.

## 2021-02-02 ENCOUNTER — Ambulatory Visit (INDEPENDENT_AMBULATORY_CARE_PROVIDER_SITE_OTHER): Payer: Medicare Other | Admitting: Medical

## 2021-02-02 ENCOUNTER — Encounter: Payer: Self-pay | Admitting: Medical

## 2021-02-02 ENCOUNTER — Other Ambulatory Visit: Payer: Self-pay

## 2021-02-02 VITALS — BP 138/80 | HR 76 | Resp 18 | Ht 61.0 in | Wt 172.0 lb

## 2021-02-02 DIAGNOSIS — Z23 Encounter for immunization: Secondary | ICD-10-CM | POA: Diagnosis not present

## 2021-02-02 DIAGNOSIS — R7989 Other specified abnormal findings of blood chemistry: Secondary | ICD-10-CM

## 2021-02-02 DIAGNOSIS — E785 Hyperlipidemia, unspecified: Secondary | ICD-10-CM | POA: Diagnosis not present

## 2021-02-02 DIAGNOSIS — E1169 Type 2 diabetes mellitus with other specified complication: Secondary | ICD-10-CM

## 2021-02-02 DIAGNOSIS — I10 Essential (primary) hypertension: Secondary | ICD-10-CM

## 2021-02-02 DIAGNOSIS — R5382 Chronic fatigue, unspecified: Secondary | ICD-10-CM

## 2021-02-02 DIAGNOSIS — M797 Fibromyalgia: Secondary | ICD-10-CM

## 2021-02-02 DIAGNOSIS — F5101 Primary insomnia: Secondary | ICD-10-CM | POA: Diagnosis not present

## 2021-02-02 DIAGNOSIS — E119 Type 2 diabetes mellitus without complications: Secondary | ICD-10-CM | POA: Diagnosis not present

## 2021-02-02 LAB — COMPREHENSIVE METABOLIC PANEL
ALT: 17 U/L (ref 0–35)
AST: 15 U/L (ref 0–37)
Albumin: 4.5 g/dL (ref 3.5–5.2)
Alkaline Phosphatase: 91 U/L (ref 39–117)
BUN: 10 mg/dL (ref 6–23)
CO2: 25 mEq/L (ref 19–32)
Calcium: 9.5 mg/dL (ref 8.4–10.5)
Chloride: 100 mEq/L (ref 96–112)
Creatinine, Ser: 0.36 mg/dL — ABNORMAL LOW (ref 0.40–1.20)
GFR: 114.05 mL/min (ref >60.00)
Glucose, Bld: 143 mg/dL — ABNORMAL HIGH (ref 70–99)
Potassium: 4.3 mEq/L (ref 3.5–5.1)
Sodium: 137 mEq/L (ref 135–145)
Total Bilirubin: 0.7 mg/dL (ref 0.2–1.2)
Total Protein: 7.1 g/dL (ref 6.0–8.3)

## 2021-02-02 LAB — CBC WITH DIFFERENTIAL/PLATELET
Basophils Absolute: 0 10*3/uL (ref 0.0–0.1)
Basophils Relative: 0.3 % (ref 0.0–3.0)
Eosinophils Absolute: 0.1 10*3/uL (ref 0.0–0.7)
Eosinophils Relative: 1.3 % (ref 0.0–5.0)
HCT: 48.5 % — ABNORMAL HIGH (ref 36.0–46.0)
Hemoglobin: 16.2 g/dL — ABNORMAL HIGH (ref 12.0–15.0)
Lymphocytes Relative: 28.8 % (ref 12.0–46.0)
Lymphs Abs: 2.6 10*3/uL (ref 0.7–4.0)
MCHC: 33.4 g/dL (ref 30.0–36.0)
MCV: 89 fl (ref 78.0–100.0)
Monocytes Absolute: 0.6 10*3/uL (ref 0.1–1.0)
Monocytes Relative: 6.2 % (ref 3.0–12.0)
Neutro Abs: 5.8 10*3/uL (ref 1.4–7.7)
Neutrophils Relative %: 63.4 % (ref 43.0–77.0)
Platelets: 235 10*3/uL (ref 150.0–400.0)
RBC: 5.45 Mil/uL — ABNORMAL HIGH (ref 3.87–5.11)
RDW: 13.6 % (ref 11.5–15.5)
WBC: 9.2 10*3/uL (ref 4.0–10.5)

## 2021-02-02 LAB — TSH: TSH: 1.1 u[IU]/mL (ref 0.35–5.50)

## 2021-02-02 LAB — LIPID PANEL
Cholesterol: 221 mg/dL — ABNORMAL HIGH (ref 0–200)
HDL: 46.5 mg/dL (ref >39.00)
NonHDL: 174.51
Total CHOL/HDL Ratio: 5
Triglycerides: 353 mg/dL — ABNORMAL HIGH (ref 0.0–149.0)
VLDL: 70.6 mg/dL — ABNORMAL HIGH (ref 0.0–40.0)

## 2021-02-02 LAB — HEMOGLOBIN A1C: Hgb A1c MFr Bld: 7.4 % — ABNORMAL HIGH (ref 4.6–6.5)

## 2021-02-02 LAB — LDL CHOLESTEROL, DIRECT: Direct LDL: 123 mg/dL

## 2021-02-02 LAB — IRON: Iron: 75 ug/dL (ref 42–145)

## 2021-02-02 LAB — VITAMIN B12: Vitamin B-12: 518 pg/mL (ref 211–911)

## 2021-02-02 LAB — T4, FREE: Free T4: 0.97 ng/dL (ref 0.60–1.60)

## 2021-02-02 MED ORDER — LOSARTAN POTASSIUM 25 MG PO TABS: 25.0000 mg | ORAL_TABLET | Freq: Every day | ORAL | 3 refills | Status: DC

## 2021-02-02 NOTE — Progress Notes (Signed)
Subjective:    Patient ID: Megan Murray, female    DOB: [birth year], 55 y.o.   MRN: [id number]  HPI  Pt is on ozempic and synjardy  for diabetes. Last a1c was 7.6. Pt has not been exercising.   Pt has been very fatigued since last visit. Pt states sleeps between 5-6 hours at night. She is on [place]. Pt denies that she snores.   Hx of depression. Pt is on prozac 40 mg daily. She states mood stable/good.  Hx of fibromyalgia. Controled with tramadol 1 tab daily in past and she states does help control.   Hx of high cholesterol. High since her 12's. Pt in past could not take statin. 3 years since saw cardiologist. Pt is on zetia. Family hx of cardiac disease. CAD mom MI , brother triple bypass.  Pt had one pneumonia vaccine 5 years or so ago but not sure which one.    Review of Systems  Constitutional:  Positive for fatigue. Negative for chills and fever.  HENT:  Negative for congestion and drooling.   Respiratory:  Negative for cough, chest tightness, shortness of breath and wheezing.   Cardiovascular:  Negative for chest pain and palpitations.  Gastrointestinal:  Negative for abdominal pain.  Genitourinary:  Negative for dysuria and flank pain.  Musculoskeletal:  Negative for back pain, myalgias and neck stiffness.  Skin:  Negative for pallor.  Neurological:  Negative for dizziness, speech difficulty, weakness and light-headedness.  Hematological:  Negative for adenopathy. Does not bruise/bleed easily.  Psychiatric/Behavioral:  Positive for sleep disturbance. Negative for behavioral problems, decreased concentration, dysphoric mood and hallucinations. The patient is not nervous/anxious.     Past Medical History:  Diagnosis Date   Asthma    Diabetes mellitus without complication ([place])    Dyslipidemia, goal to be determined    High triglycerides and LDL   Fatty liver 05/16/2018   By CT scan   Fibromyalgia    Hyperlipidemia    Hypertension    Major depression, recurrent,  chronic ([place]) 04/25/2018   Dr. Toy Care   Obesity due to excess calories without serious comorbidity    Osteoarthritis      Social History   Socioeconomic History   Marital status: Married    Spouse name: Not on file   Number of children: 1   Years of education: Not on file   Highest education level: Not on file  Occupational History   Occupation: disabled due to fibromyalgia and depression  Tobacco Use   Smoking status: Never   Smokeless tobacco: Never  Vaping Use   Vaping Use: Never used  Substance and Sexual Activity   Alcohol use: No   Drug use: No   Sexual activity: Yes  Other Topics Concern   Not on file  Social History Narrative   Not on file   Social Determinants of Health   Financial Resource Strain: Not on file  Food Insecurity: Not on file  Transportation Needs: Not on file  Physical Activity: Not on file  Stress: Not on file  Social Connections: Not on file  Intimate Partner Violence: Not on file    Past Surgical History:  Procedure Laterality Date   ABDOMINAL HYSTERECTOMY     REDUCTION MAMMAPLASTY      Family History  Problem Relation Age of Onset   COPD Mother        Died at 59   Heart failure Mother        Not sure of details  Esophageal cancer Father        Survived cancer treatment and surgery   Drug abuse Father    Cirrhosis Father        NAFLD   Healthy Sister    Diabetes Brother    Depression Brother    Heart attack Brother    Heart disease Brother    Diabetes Maternal Grandmother    Arthritis Maternal Grandmother    Cancer Paternal Grandmother    Hearing loss Paternal Grandmother    Liver disease Paternal Grandfather    Diabetes Maternal Grandfather     Allergies  Allergen Reactions   Atorvastatin    Cholestyramine     Gi upset   Crestor [Rosuvastatin Calcium]    Fenofibrate Other (See Comments)    STOMACH CRAMPS,AND DIARRHEA   Simvastatin    Soy Allergy    Statins     GI upset; myalgias   Codeine Palpitations     All codeine   Meloxicam Rash    Current Outpatient Medications on File Prior to Visit  Medication Sig Dispense Refill   albuterol (VENTOLIN HFA) 108 (90 Base) MCG/ACT inhaler Inhale 2 puffs into the lungs every 6 (six) hours as needed for wheezing or shortness of breath. 6.7 g 2   Blood Glucose Monitoring Suppl (ONE TOUCH ULTRA 2) w/Device KIT 1 kit by Does not apply route 2 (two) times daily. 1 kit 0   diclofenac (VOLTAREN) 75 MG EC tablet Take 1 tablet (75 mg total) by mouth 2 (two) times daily. 60 tablet 1   diclofenac sodium (VOLTAREN) 1 % GEL APP 0.5 TO 1 GRAM EXT AA QID PRN     ezetimibe (ZETIA) 10 MG tablet TAKE 1 TABLET(10 MG) BY MOUTH DAILY 90 tablet 2   FLUoxetine (PROZAC) 40 MG capsule TAKE 1 CAPSULE(40 MG) BY MOUTH DAILY 90 capsule 0   glucose blood (ONETOUCH ULTRA) test strip USE FOR TESTING TWICE DAILY 100 strip 1   lidocaine (LIDODERM) 5 % Place 2 patches onto the skin daily. Suggest neck/shoulders- Remove & Discard patch within 12 hours or as directed by MD 60 patch 5   OZEMPIC, 1 MG/DOSE, 4 MG/3ML SOPN INJECT 1MG INTO THE SKIN ONCE WEEKLY 3 mL 2   pantoprazole (PROTONIX) 40 MG tablet TAKE 1 TABLET(40 MG) BY MOUTH DAILY 90 tablet 1   SYNJARDY XR 25-1000 MG TB24 TAKE 1 TABLET BY MOUTH DAILY 90 tablet 1   traMADol (ULTRAM) 50 MG tablet 1 tab po q day prn severe pain 30 tablet 0   zolpidem (AMBIEN) 10 MG tablet TAKE 1 TABLET BY MOUTH AT BEDTIME AS NEEDED FOR INSOMNIA 30 tablet 0   VASCEPA 0.5 g CAPS TAKE 1 CAPSULE BY MOUTH AT BEDTIME 30 capsule 11   No current facility-administered medications on file prior to visit.    BP (!) 147/83   Pulse 76   Resp 18   Ht 5' 1" (1.549 m)   Wt 172 lb (78 kg)   SpO2 98%   BMI 32.50 kg/m       Objective:   Physical Exam  General Mental Status- Alert. General Appearance- Not in acute distress.   Skin General: Color- Normal Color. Moisture- Normal Moisture.  Neck Carotid Arteries- Normal color. Moisture- Normal Moisture. No  carotid bruits. No JVD.  Chest and Lung Exam Auscultation: Breath Sounds:-Normal.  Cardiovascular Auscultation:Rythm- Regular. Murmurs & Other Heart Sounds:Auscultation of the heart reveals- No Murmurs.  Abdomen Inspection:-Inspeection Normal. Palpation/Percussion:Note:No mass. Palpation and Percussion of the abdomen  reveal- Non Tender, Non Distended + BS, no rebound or guarding.    Neurologic Cranial Nerve exam:- CN III-XII intact(No nystagmus), symmetric smile. Drift Test:- No drift. Romberg Exam:- Negative.  Heal to Toe Gait exam:-Normal. Finger to Nose:- Normal/Intact Strength:- 5/5 equal and symmetric strength both upper and lower extremities.       Assessment & Plan:   Your blood pressure is elevated today when I checked.  On the last visit I want you to check your blood pressures daily to see if your close to 130/80.  Please check your blood pressure at home as we discussed.  I am going to go ahead and send you in low-dose losartan 25 mg to take daily if bp above 130/80.  History of diabetes.  Continue current med regimen and will get D9R with metabolic panel today.  History of high cholesterol and on Zetia.  Strong family history of coronary artery disease.  If lipid levels are still up then would refer to cardiologist to discuss options since you had side effects of statins in the past.  History of insomnia.  Continue with Ambien.  History of fibromyalgia.  Failed various medication treatments in the past.  Historically have done well with just 1 tramadol daily.  Continue that.  Recent fatigue over the past 5 months.  History of intermittent chronic fatigue.  Will get B12, B1, vitamin D and thyroid studies.  Follow-up in 3 months or sooner if needed.  Mackie Pai, PA-C   Time spent with patient today was 41 minutes which consisted of chart review, discussing diagnosis, work up ,treatment and documentation.

## 2021-02-02 NOTE — Patient Instructions (Signed)
Your blood pressure is elevated today when I checked.  On the last visit I want you to check your blood pressures daily to see if your close to 130/80.  Please check your blood pressure at home as we discussed.  I am going to go ahead and send you in low-dose losartan 25 mg to take daily if bp above 130/80.  History of diabetes.  Continue current med regimen and will get A1c with metabolic panel today.  History of high cholesterol and on Zetia.  Strong family history of coronary artery disease.  If lipid levels are still up then would refer to cardiologist to discuss options since you had side effects of statins in the past.  History of insomnia.  Continue with Ambien.  History of fibromyalgia.  Failed various medication treatments in the past.  Historically have done well with just 1 tramadol daily.  Continue that.  Recent fatigue over the past 5 months.  History of intermittent chronic fatigue.  Will get B12, B1, vitamin D and thyroid studies.  Follow-up in 3 months or sooner if needed.

## 2021-02-04 ENCOUNTER — Telehealth: Payer: Self-pay | Admitting: *Deleted

## 2021-02-04 NOTE — Telephone Encounter (Signed)
Per referral Dr. Alvira Monday - called and gave upcoming appointments - patient confirmed

## 2021-02-04 NOTE — Addendum Note (Signed)
Addended by: Gwenevere Abbot on: 02/04/2021 07:14 AM   Modules accepted: Orders

## 2021-02-06 ENCOUNTER — Encounter: Payer: Self-pay | Admitting: Medical

## 2021-02-06 LAB — VITAMIN D 1,25 DIHYDROXY
Vitamin D 1, 25 (OH)2 Total: 37 pg/mL (ref 18–72)
Vitamin D2 1, 25 (OH)2: <8 pg/mL
Vitamin D3 1, 25 (OH)2: 37 pg/mL

## 2021-02-07 ENCOUNTER — Telehealth: Payer: Self-pay | Admitting: Medical

## 2021-02-07 LAB — VITAMIN B1: Vitamin B1 (Thiamine): 18 nmol/L (ref 8–30)

## 2021-02-07 MED ORDER — SEMAGLUTIDE (2 MG/DOSE) 8 MG/3ML ~~LOC~~ SOPN: 2.0000 mg | PEN_INJECTOR | SUBCUTANEOUS | 2 refills | Status: DC

## 2021-02-07 NOTE — Telephone Encounter (Signed)
Rx ozempic sent to pt pharmacy. Higher dose basedon a1c.

## 2021-02-09 ENCOUNTER — Telehealth: Payer: Self-pay

## 2021-02-09 NOTE — Telephone Encounter (Signed)
PA for Ozempuc 8mg /60ml started today and sent to plan , awaiting approval or denial   Key: [id number]

## 2021-02-10 ENCOUNTER — Other Ambulatory Visit: Payer: Self-pay | Admitting: Family Medicine

## 2021-02-10 DIAGNOSIS — E118 Type 2 diabetes mellitus with unspecified complications: Secondary | ICD-10-CM

## 2021-02-10 NOTE — Telephone Encounter (Signed)
PA approved until 07/11/21.  

## 2021-02-12 ENCOUNTER — Other Ambulatory Visit: Payer: Self-pay | Admitting: Medical

## 2021-02-12 DIAGNOSIS — E118 Type 2 diabetes mellitus with unspecified complications: Secondary | ICD-10-CM

## 2021-02-16 ENCOUNTER — Other Ambulatory Visit: Payer: Self-pay | Admitting: Medical

## 2021-02-16 DIAGNOSIS — F5101 Primary insomnia: Secondary | ICD-10-CM

## 2021-02-16 NOTE — Telephone Encounter (Addendum)
Requesting: Megan Murray Contract: 11/11/20 UDS: 11/03/20 Last Visit: 02/02/21 Next Visit:05/06/21 Last Refill:01/21/21  Please Advise   Will send in rx but specify on rx fill when due. Have pt follow up with me in October. Also ask her to send refill request 3 days in advance of refill as state law prohibits me from filling too early.  Not sure if she is asking early or pharmacy request.

## 2021-02-19 ENCOUNTER — Other Ambulatory Visit: Payer: Self-pay

## 2021-02-19 DIAGNOSIS — E6609 Other obesity due to excess calories: Secondary | ICD-10-CM | POA: Insufficient documentation

## 2021-02-19 DIAGNOSIS — E785 Hyperlipidemia, unspecified: Secondary | ICD-10-CM | POA: Insufficient documentation

## 2021-02-19 DIAGNOSIS — E119 Type 2 diabetes mellitus without complications: Secondary | ICD-10-CM | POA: Insufficient documentation

## 2021-02-19 DIAGNOSIS — J45909 Unspecified asthma, uncomplicated: Secondary | ICD-10-CM | POA: Insufficient documentation

## 2021-02-19 HISTORY — DX: Type 2 diabetes mellitus without complications: E11.9

## 2021-02-19 HISTORY — DX: Unspecified asthma, uncomplicated: J45.909

## 2021-02-20 ENCOUNTER — Other Ambulatory Visit: Payer: Self-pay | Admitting: Medical

## 2021-02-20 ENCOUNTER — Other Ambulatory Visit: Payer: Self-pay

## 2021-02-20 ENCOUNTER — Ambulatory Visit: Payer: Medicare Other | Admitting: Cardiology

## 2021-02-20 ENCOUNTER — Encounter: Payer: Self-pay | Admitting: Cardiology

## 2021-02-20 VITALS — BP 146/86 | HR 85 | Ht 61.0 in | Wt 182.0 lb

## 2021-02-20 DIAGNOSIS — R0789 Other chest pain: Secondary | ICD-10-CM | POA: Insufficient documentation

## 2021-02-20 DIAGNOSIS — R0609 Other forms of dyspnea: Secondary | ICD-10-CM | POA: Insufficient documentation

## 2021-02-20 DIAGNOSIS — E119 Type 2 diabetes mellitus without complications: Secondary | ICD-10-CM | POA: Diagnosis not present

## 2021-02-20 DIAGNOSIS — E782 Mixed hyperlipidemia: Secondary | ICD-10-CM | POA: Diagnosis not present

## 2021-02-20 DIAGNOSIS — R5383 Other fatigue: Secondary | ICD-10-CM

## 2021-02-20 DIAGNOSIS — F339 Major depressive disorder, recurrent, unspecified: Secondary | ICD-10-CM

## 2021-02-20 DIAGNOSIS — R0600 Dyspnea, unspecified: Secondary | ICD-10-CM

## 2021-02-20 DIAGNOSIS — R079 Chest pain, unspecified: Secondary | ICD-10-CM

## 2021-02-20 HISTORY — DX: Other chest pain: R07.89

## 2021-02-20 HISTORY — DX: Other forms of dyspnea: R06.09

## 2021-02-20 MED ORDER — METOPROLOL TARTRATE 100 MG PO TABS: ORAL_TABLET | ORAL | 0 refills | Status: DC

## 2021-02-20 MED ORDER — ASPIRIN EC 81 MG PO TBEC: 81.0000 mg | DELAYED_RELEASE_TABLET | Freq: Every day | ORAL | 3 refills | Status: DC

## 2021-02-20 NOTE — Progress Notes (Signed)
Cardiology Consultation:    Date:  02/20/2021   ID:  Megan Murray, DOB [birth year], MRN [id number]  PCP:  Esperanza Richters, PA-C  Cardiologist:  Gypsy Balsam, MD   Referring MD: Esperanza Richters, [place]   Chief Complaint  Patient presents with   Hyperlipidemia    Associated with DM 2    History of Present Illness:    Megan Murray is a 55 y.o. female who is being seen today for the evaluation of multiple results for coronary artery disease with atypical symptoms at the request of [place], Ramon Dredge, [place].  Her past medical history significant for diabetes that should be struggling for about 12 years, dyslipidemia with difficulty tolerating any statin, essential hypertension, fibromyalgia, depression.  She was referred to [place] because she does have some atypical symptoms she did suffer from COVID-19 infection x2.  After second episode which was in January of this year she is really struggling.  She is complaining of being weak tired exhausted having no energy.  She also described to have some fatigue tiredness and shortness of breath while walking.  On top of that some uneasy sensation in the chest.  She does have multiple risk factors for coronary artery disease namely diabetes, dyslipidemia which she have difficulty tolerating medication so is uncontrolled.  Luckily he never smoked.  She does have multiple family members having coronary artery disease.  Her brother who is 47 years old just recently had coronary artery bypass graft. She does not exercise on the regular basis She is noting them diet She never smoked  Past Medical History:  Diagnosis Date   Asthma    Diabetes mellitus without complication (HCC)    Dyslipidemia, goal to be determined    High triglycerides and LDL   Fatty liver 05/16/2018   By CT scan   Fibromyalgia    Hyperlipidemia    Hypertension    Major depression, recurrent, chronic (HCC) 04/25/2018   Dr. Evelene Croon   Obesity due to excess calories without serious  comorbidity    Osteoarthritis     Past Surgical History:  Procedure Laterality Date   ABDOMINAL HYSTERECTOMY     REDUCTION MAMMAPLASTY      Current Medications: Current Meds  Medication Sig   albuterol (VENTOLIN HFA) 108 (90 Base) MCG/ACT inhaler Inhale 2 puffs into the lungs every 6 (six) hours as needed for wheezing or shortness of breath.   diclofenac sodium (VOLTAREN) 1 % GEL Apply 2 g topically 4 (four) times daily.   ezetimibe (ZETIA) 10 MG tablet TAKE 1 TABLET(10 MG) BY MOUTH DAILY (Patient taking differently: Take 10 mg by mouth daily. TAKE 1 TABLET(10 MG) BY MOUTH DAILY)   FLUoxetine (PROZAC) 40 MG capsule TAKE 1 CAPSULE(40 MG) BY MOUTH DAILY (Patient taking differently: Take 40 mg by mouth daily. TAKE 1 CAPSULE(40 MG) BY MOUTH DAILY)   lidocaine (LIDODERM) 5 % Place 2 patches onto the skin daily. Suggest neck/shoulders- Remove & Discard patch within 12 hours or as directed by MD   losartan (COZAAR) 25 MG tablet Take 1 tablet (25 mg total) by mouth daily.   pantoprazole (PROTONIX) 40 MG tablet TAKE 1 TABLET(40 MG) BY MOUTH DAILY (Patient taking differently: Take 40 mg by mouth daily.)   Semaglutide, 2 MG/DOSE, 8 MG/3ML SOPN Inject 2 mg as directed once a week.   SYNJARDY XR 25-1000 MG TB24 TAKE 1 TABLET BY MOUTH DAILY   traMADol (ULTRAM) 50 MG tablet 1 tab po q day prn severe pain (Patient taking  differently: Take 50 mg by mouth daily as needed for moderate pain or severe pain. 1 tab po q day prn severe pain)   zolpidem (AMBIEN) 10 MG tablet TAKE 1 TABLET BY MOUTH AT BEDTIME AS NEEDED FOR INSOMNIA (Patient taking differently: Take 10 mg by mouth at bedtime as needed. TAKE 1 TABLET BY MOUTH AT BEDTIME AS NEEDED FOR INSOMNIA)     Allergies:   Atorvastatin, Cholestyramine, Crestor [rosuvastatin calcium], Fenofibrate, Simvastatin, Soy allergy, Statins, Codeine, and Meloxicam   Social History   Socioeconomic History   Marital status: Married    Spouse name: Not on file   Number  of children: 1   Years of education: Not on file   Highest education level: Not on file  Occupational History   Occupation: disabled due to fibromyalgia and depression  Tobacco Use   Smoking status: Never   Smokeless tobacco: Never  Vaping Use   Vaping Use: Never used  Substance and Sexual Activity   Alcohol use: No   Drug use: No   Sexual activity: Yes  Other Topics Concern   Not on file  Social History Narrative   Not on file   Social Determinants of Health   Financial Resource Strain: Not on file  Food Insecurity: Not on file  Transportation Needs: Not on file  Physical Activity: Not on file  Stress: Not on file  Social Connections: Not on file     Family History: The patient's family history includes Arthritis in her maternal grandmother; COPD in her mother; Cancer in her paternal grandmother; Cirrhosis in her father; Depression in her brother; Diabetes in her brother, maternal grandfather, and maternal grandmother; Drug abuse in her father; Esophageal cancer in her father; Healthy in her sister; Hearing loss in her paternal grandmother; Heart attack in her brother; Heart disease in her brother; Heart failure in her mother; Liver disease in her paternal grandfather. ROS:   Please see the history of present illness.    All 14 point review of systems negative except as described per history of present illness.  EKGs/Labs/Other Studies Reviewed:    The following studies were reviewed today:   EKG:  EKG is  ordered today.  The ekg ordered today demonstrates normal sinus rhythm, cannot rule out inferior wall MI cannot rule out anterior wall MI.  Recent Labs: 02/02/2021: ALT 17; BUN 10; Creatinine, Ser 0.36; Hemoglobin 16.2; Platelets 235.0; Potassium 4.3; Sodium 137; TSH 1.10  Recent Lipid Panel    Component Value Date/Time   CHOL 221 (H) 02/02/2021 1108   TRIG 353.0 (H) 02/02/2021 1108   HDL 46.50 02/02/2021 1108   CHOLHDL 5 02/02/2021 1108   VLDL 70.6 (H) 02/02/2021  1108   LDLCALC 104 03/14/2018 0000   LDLDIRECT 123.0 02/02/2021 1108    Physical Exam:    VS:  BP (!) 146/86 (BP Location: Right Arm, Patient Position: Sitting)   Pulse 85   Ht 5\' 1"  (1.549 m)   Wt 182 lb (82.6 kg)   SpO2 97%   BMI 34.39 kg/m     Wt Readings from Last 3 Encounters:  02/20/21 182 lb (82.6 kg)  02/02/21 172 lb (78 kg)  11/03/20 183 lb (83 kg)     GEN:  Well nourished, well developed in no acute distress HEENT: Normal NECK: No JVD; No carotid bruits LYMPHATICS: No lymphadenopathy CARDIAC: RRR, no murmurs, no rubs, no gallops RESPIRATORY:  Clear to auscultation without rales, wheezing or rhonchi  ABDOMEN: Soft, non-tender, non-distended MUSCULOSKELETAL:  No  edema; No deformity  SKIN: Warm and dry NEUROLOGIC:  Alert and oriented x 3 PSYCHIATRIC:  Normal affect   ASSESSMENT:    1. Type 2 diabetes mellitus without complication, without Yaeger-term current use of insulin (HCC)   2. Dyspnea on exertion   3. Atypical chest pain   4. Mixed hyperlipidemia    PLAN:    In order of problems listed above:  Atypical chest pain with dyspnea on exertion.  Multiple risk factors for coronary artery disease, abnormal EKG.  I will schedule her to have echocardiogram to assess left ventricle ejection fraction and look for segmental wall motion abnormalities.  Since she does have some risk factors for coronary artery disease even with lack of typical symptoms I think it would be prudent to evaluate for coronary artery disease.  Therefore, we will schedule her to have coronary CT angio.  I did explain procedure to her and including all risk benefits.  I asked her also to start taking 1 baby aspirin every single day. Dyslipidemia: She does have intolerance to multiple medications.  I did review data by her primary care physician, last direct LDL was 123.  She can benefit from at least moderate intensity statin but is not able to tolerate it.  Most likely will do referral to lipid  clinic.  She may require Nexletol, will also consider Zetia.  However I do think Zetia alone will bring her cholesterol to reasonable range.  She may require to eventually injectable medication however recommendation for dose medication will be based on results of her coronary CT angio meaning if she does have some coronary disease then we need to be very aggressive bring her LDL below 70.  And that most likely required PCSK9 agent. Jamas Lav exertion again echocardiogram will be done Diabetes mellitus: Hemoglobin A1c from July of this year showing 7.4 which is high and she understands she need to do better control of it.  I told her also that with her triglycerides being very high avoiding carbohydrates doing exercises controlling her diabetes good will improve that problem.   Medication Adjustments/Labs and Tests Ordered: Current medicines are reviewed at length with the patient today.  Concerns regarding medicines are outlined above.  No orders of the defined types were placed in this encounter.  No orders of the defined types were placed in this encounter.   Signed, Georgeanna Lea, MD, [facility]. 02/20/2021 2:16 PM    [facility]

## 2021-02-20 NOTE — Patient Instructions (Addendum)
Medication Instructions:  Your physician has recommended you make the following change in your medication:  START: Aspirin 81 mg once daily *If you need a refill on your cardiac medications before your next appointment, please call your pharmacy*   Lab Work: Your physician recommends that you return for lab work in:  3-7 days before CT scan: BMET If you have labs (blood work) drawn today and your tests are completely normal, you will receive your results only by: MyChart Message (if you have MyChart) OR A paper copy in the mail If you have any lab test that is abnormal or we need to change your treatment, we will call you to review the results.   Testing/Procedures: Your physician has requested that you have an echocardiogram. Echocardiography is a painless test that uses sound waves to create images of your heart. It provides your doctor with information about the size and shape of your heart and how well your heart's chambers and valves are working. This procedure takes approximately one hour. There are no restrictions for this procedure.    Your cardiac CT will be scheduled at one of the below locations:   [facility] [address] [place], [place] [address] [phone]   If scheduled at [facility], please arrive at the [facility] main entrance (entrance A) of [facility] 30 minutes prior to test start time. Proceed to the [facility] Radiology Department (first floor) to check-in and test prep.    Please follow these instructions carefully (unless otherwise directed):   On the Night Before the Test: Be sure to Drink plenty of water. Do not consume any caffeinated/decaffeinated beverages or chocolate 12 hours prior to your test. Do not take any antihistamines 12 hours prior to your test.   On the Day of the Test: Drink plenty of water until 1 hour prior to the test. Do not eat any food 4 hours prior to the test. You may take your  regular medications prior to the test.  Take metoprolol (Lopressor) two hours prior to test. FEMALES- please wear underwire-free bra if available, avoid dresses & tight clothing       After the Test: Drink plenty of water. After receiving IV contrast, you may experience a mild flushed feeling. This is normal. On occasion, you may experience a mild rash up to 24 hours after the test. This is not dangerous. If this occurs, you can take Benadryl 25 mg and increase your fluid intake. If you experience trouble breathing, this can be serious. If it is severe call 911 IMMEDIATELY. If it is mild, please call our office. If you take any of these medications: Glipizide/Metformin, Avandament, Glucavance, please do not take 48 hours after completing test unless otherwise instructed.  Please allow 2-4 weeks for scheduling of routine cardiac CTs. Some insurance companies require a pre-authorization which may delay scheduling of this test.   For non-scheduling related questions, please contact the cardiac imaging nurse navigator should you have any questions/concerns: Rockwell Alexandria, Cardiac Imaging Nurse Navigator Larey Brick, Cardiac Imaging Nurse Navigator [facility] Direct Office Dial: [phone]   For scheduling needs, including cancellations and rescheduling, please call [place], [phone].   Follow-Up: At [facility], you and your health needs are our priority.  As part of our continuing mission to provide you with exceptional heart care, we have created designated Provider Care Teams.  These Care Teams include your primary Cardiologist (physician) and Advanced Practice Providers (APPs -  Physician Assistants and Nurse Practitioners) who all work together to provide you with the care you need, when you need it.  We recommend signing up for the patient portal called "MyChart".  Sign up information is provided on this After Visit Summary.  MyChart is used to  connect with patients for Virtual Visits (Telemedicine).  Patients are able to view lab/test results, encounter notes, upcoming appointments, etc.  Non-urgent messages can be sent to your provider as well.   To learn more about what you can do with MyChart, go to [email or web address].    Your next appointment:   2 month(s)  The format for your next appointment:   In Person  Provider:   Gypsy Balsam, MD   Other Instructions Echocardiogram An echocardiogram is a test that uses sound waves (ultrasound) to produce images of the heart. Images from an echocardiogram can provide important information about: Heart size and shape. The size and thickness and movement of your heart's walls. Heart muscle function and strength. Heart valve function or if you have stenosis. Stenosis is when the heart valves are too narrow. If blood is flowing backward through the heart valves (regurgitation). A tumor or infectious growth around the heart valves. Areas of heart muscle that are not working well because of poor blood flow or injury from a heart attack. Aneurysm detection. An aneurysm is a weak or damaged part of an artery wall. The wall bulges out from the normal force of blood pumping through the body. Tell a health care provider about: Any allergies you have. All medicines you are taking, including vitamins, herbs, eye drops, creams, and over-the-counter medicines. Any blood disorders you have. Any surgeries you have had. Any medical conditions you have. Whether you are pregnant or may be pregnant. What are the risks? Generally, this is a safe test. However, problems may occur, including an allergic reaction to dye (contrast) that may be used during the test. What happens before the test? No specific preparation is needed. You may eat and drink normally. What happens during the test?  You will take off your clothes from the waist up and put on a hospital gown. Electrodes or  electrocardiogram (ECG)patches may be placed on your chest. The electrodes or patches are then connected to a device that monitors your heart rate and rhythm. You will lie down on a table for an ultrasound exam. A gel will be applied to your chest to help sound waves pass through your skin. A handheld device, called a transducer, will be pressed against your chest and moved over your heart. The transducer produces sound waves that travel to your heart and bounce back (or "echo" back) to the transducer. These sound waves will be captured in real-time and changed into images of your heart that can be viewed on a video monitor. The images will be recorded on a computer and reviewed by your health care provider. You may be asked to change positions or hold your breath for a short time. This makes it easier to get different views or better views of your heart. In some cases, you may receive contrast through an IV in one of your veins. This can improve the quality of the pictures from your heart. The procedure may vary among health care providers and hospitals. What can I expect after the test? You may return to your normal, everyday life, including diet, activities, andmedicines, unless your health care provider tells you not to do that. Follow these instructions at home: It is  up to you to get the results of your test. Ask your health care provider, or the department that is doing the test, when your results will be ready. Keep all follow-up visits. This is important. Summary An echocardiogram is a test that uses sound waves (ultrasound) to produce images of the heart. Images from an echocardiogram can provide important information about the size and shape of your heart, heart muscle function, heart valve function, and other possible heart problems. You do not need to do anything to prepare before this test. You may eat and drink normally. After the echocardiogram is completed, you may return to your  normal, everyday life, unless your health care provider tells you not to do that. This information is not intended to replace advice given to you by your health care provider. Make sure you discuss any questions you have with your healthcare provider. Document Revised: 02/19/2020 Document Reviewed: 02/19/2020 Elsevier Patient Education  2022 [employer].

## 2021-02-21 ENCOUNTER — Other Ambulatory Visit: Payer: Self-pay | Admitting: Cardiology

## 2021-02-24 ENCOUNTER — Other Ambulatory Visit: Payer: Self-pay

## 2021-02-24 DIAGNOSIS — I10 Essential (primary) hypertension: Secondary | ICD-10-CM

## 2021-03-04 ENCOUNTER — Other Ambulatory Visit: Payer: Self-pay

## 2021-03-04 DIAGNOSIS — I10 Essential (primary) hypertension: Secondary | ICD-10-CM

## 2021-03-05 LAB — BASIC METABOLIC PANEL
BUN/Creatinine Ratio: 26 — ABNORMAL HIGH (ref 9–23)
BUN: 11 mg/dL (ref 6–24)
CO2: 19 mmol/L — ABNORMAL LOW (ref 20–29)
Calcium: 9.2 mg/dL (ref 8.7–10.2)
Chloride: 98 mmol/L (ref 96–106)
Creatinine, Ser: 0.43 mg/dL — ABNORMAL LOW (ref 0.57–1.00)
Glucose: 181 mg/dL — ABNORMAL HIGH (ref 65–99)
Potassium: 3.7 mmol/L (ref 3.5–5.2)
Sodium: 136 mmol/L (ref 134–144)
eGFR: 115 mL/min/{1.73_m2} (ref >59)

## 2021-03-06 ENCOUNTER — Other Ambulatory Visit: Payer: Self-pay

## 2021-03-06 ENCOUNTER — Telehealth: Payer: Self-pay

## 2021-03-06 ENCOUNTER — Inpatient Hospital Stay: Payer: Medicare Other | Attending: Hematology & Oncology

## 2021-03-06 ENCOUNTER — Other Ambulatory Visit: Payer: Self-pay | Admitting: Family

## 2021-03-06 ENCOUNTER — Inpatient Hospital Stay: Payer: Medicare Other | Admitting: Family

## 2021-03-06 ENCOUNTER — Encounter: Payer: Self-pay | Admitting: Family

## 2021-03-06 ENCOUNTER — Telehealth (HOSPITAL_COMMUNITY): Payer: Self-pay | Admitting: Emergency Medicine

## 2021-03-06 VITALS — BP 136/68 | HR 83 | Temp 98.5°F | Resp 18 | Ht 61.0 in | Wt 180.1 lb

## 2021-03-06 DIAGNOSIS — I10 Essential (primary) hypertension: Secondary | ICD-10-CM

## 2021-03-06 DIAGNOSIS — E119 Type 2 diabetes mellitus without complications: Secondary | ICD-10-CM | POA: Diagnosis not present

## 2021-03-06 DIAGNOSIS — Z808 Family history of malignant neoplasm of other organs or systems: Secondary | ICD-10-CM | POA: Diagnosis not present

## 2021-03-06 DIAGNOSIS — D751 Secondary polycythemia: Secondary | ICD-10-CM

## 2021-03-06 LAB — CMP (CANCER CENTER ONLY)
ALT: 15 U/L (ref 0–44)
AST: 12 U/L — ABNORMAL LOW (ref 15–41)
Albumin: 4.3 g/dL (ref 3.5–5.0)
Alkaline Phosphatase: 80 U/L (ref 38–126)
Anion gap: 11 (ref 5–15)
BUN: 11 mg/dL (ref 6–20)
CO2: 26 mmol/L (ref 22–32)
Calcium: 9.6 mg/dL (ref 8.9–10.3)
Chloride: 101 mmol/L (ref 98–111)
Creatinine: 0.47 mg/dL (ref 0.44–1.00)
GFR, Estimated: >60 mL/min (ref >60)
Glucose, Bld: 221 mg/dL — ABNORMAL HIGH (ref 70–99)
Potassium: 3.4 mmol/L — ABNORMAL LOW (ref 3.5–5.1)
Sodium: 138 mmol/L (ref 135–145)
Total Bilirubin: 0.7 mg/dL (ref 0.3–1.2)
Total Protein: 7.1 g/dL (ref 6.5–8.1)

## 2021-03-06 LAB — CBC WITH DIFFERENTIAL (CANCER CENTER ONLY)
Abs Immature Granulocytes: 0.1 10*3/uL — ABNORMAL HIGH (ref 0.00–0.07)
Basophils Absolute: 0 10*3/uL (ref 0.0–0.1)
Basophils Relative: 1 %
Eosinophils Absolute: 0.1 10*3/uL (ref 0.0–0.5)
Eosinophils Relative: 2 %
HCT: 47.3 % — ABNORMAL HIGH (ref 36.0–46.0)
Hemoglobin: 16.3 g/dL — ABNORMAL HIGH (ref 12.0–15.0)
Immature Granulocytes: 1 %
Lymphocytes Relative: 31 %
Lymphs Abs: 2.7 10*3/uL (ref 0.7–4.0)
MCH: 30.2 pg (ref 26.0–34.0)
MCHC: 34.5 g/dL (ref 30.0–36.0)
MCV: 87.6 fL (ref 80.0–100.0)
Monocytes Absolute: 0.5 10*3/uL (ref 0.1–1.0)
Monocytes Relative: 6 %
Neutro Abs: 5.1 10*3/uL (ref 1.7–7.7)
Neutrophils Relative %: 59 %
Platelet Count: 238 10*3/uL (ref 150–400)
RBC: 5.4 MIL/uL — ABNORMAL HIGH (ref 3.87–5.11)
RDW: 13.2 % (ref 11.5–15.5)
WBC Count: 8.6 10*3/uL (ref 4.0–10.5)
nRBC: 0 % (ref 0.0–0.2)

## 2021-03-06 LAB — RETICULOCYTES
Immature Retic Fract: 16.5 % — ABNORMAL HIGH (ref 2.3–15.9)
RBC.: 5.44 MIL/uL — ABNORMAL HIGH (ref 3.87–5.11)
Retic Count, Absolute: 100.6 10*3/uL (ref 19.0–186.0)
Retic Ct Pct: 1.9 % (ref 0.4–3.1)

## 2021-03-06 LAB — IRON AND TIBC
Iron: 94 ug/dL (ref 41–142)
Saturation Ratios: 27 % (ref 21–57)
TIBC: 354 ug/dL (ref 236–444)
UIBC: 260 ug/dL (ref 120–384)

## 2021-03-06 LAB — LACTATE DEHYDROGENASE: LDH: 126 U/L (ref 98–192)

## 2021-03-06 LAB — SAVE SMEAR(SSMR), FOR PROVIDER SLIDE REVIEW

## 2021-03-06 LAB — FERRITIN: Ferritin: 131 ng/mL (ref 11–307)

## 2021-03-06 NOTE — Progress Notes (Addendum)
Hematology/Oncology Consultation   Name: Megan Murray      MRN: [id number]    Location: Room/bed info not found  Date: 03/06/2021 Time:10:05 AM   REFERRING PHYSICIAN: Esperanza Richters, PA-C  REASON FOR CONSULT: Abnormal CBC   DIAGNOSIS: Erythrocytosis   HISTORY OF PRESENT ILLNESS: Megan Murray is a very pleasant 55 yo caucasian female with history of mild erythrocytosis noted since 2017.  Hgb today is stable at 16.3, MCV 87, platelets 238 and WBC count 8.6.  Megan Murray has chronic insomnia and states that Megan Murray has been on Ambien since 2004.  Megan Murray states that Megan Murray is symptomatic with fatigue.  Megan Murray has never been tested for sleep apnea.  Megan Murray does not smoke or use recreational drugs.  Megan Murray is not on any type of hormone therapy.  Megan Murray has Megan occasional alcoholic beverage socially.  Megan Murray has a strong family history of hemochromatosis. Megan Murray states that Megan Murray has been tested and Megan Murray result was negative.  Megan Murray father, paternal uncle and paternal grandfather all had NASH.  No personal history of liver disease.  Megan Murray is diabetic diagnosed in 2012 and is currently on [place]. Hgb A1c in July was 7.4.  Megan Murray has HTN and recently started Cozaar and Lopressor.  No history of thyroid disease.  No personal history of stroke or heart attack. No history of thrombotic event.  No personal history of cancer. Family history includes: father - esophageal, paternal cousin - esophageal, paternal grandmother - pancreatic, paternal great grandfather - stomach and paternal great niece 53 yo - leukemia.  Megan Murray is due for Megan Murray annual mammogram and plans to call and schedule.  Megan Murray states that Megan Murray is up to date on Megan Murray colonscopy and has history of benign polyps while in Megan Murray 30's.   Megan Murray has 1 child and history of 1 early miscarriage within Megan first 8 weeks.  Megan Murray had a hysterectomy in 2003 due t fibroids and associated hemorrhaging.  Megan Murray has generalized aches and pains secondary to fibromyalgia.  Megan Murray has had Covid twice with Megan most  recent bout in January 2022. Megan Murray states that Megan Murray is fully vaccinated.  No fever, chills, n/v, cough, rash, dizziness, SOB, chest pain, palpitations, abdominal pain or changes in bowel or bowel habits.  No abnormal bruising or petechiae.  No swelling, tenderness, numbness or tingling in Megan Murray extremities.  No falls or syncope.  Megan Murray has maintained a good appetite and is doing Megan Murray best to stay well hydrated. Megan Murray weight is stable at 180 lbs.   ROS: All other 10 point review of systems is negative.   PAST MEDICAL HISTORY:   Past Medical History:  Diagnosis Date   Asthma    Diabetes mellitus without complication (HCC)    Dyslipidemia, goal to be determined    High triglycerides and LDL   Fatty liver 05/16/2018   By CT scan   Fibromyalgia    Hyperlipidemia    Hypertension    Major depression, recurrent, chronic (HCC) 04/25/2018   Dr. Evelene Croon   Obesity due to excess calories without serious comorbidity    Osteoarthritis     ALLERGIES: Allergies  Allergen Reactions   Codeine Palpitations    Natural or Synthetic Codeine   Atorvastatin Nausea Only and Other (See Comments)    Myalgias   Cholestyramine Diarrhea and Nausea Only   Crestor [Rosuvastatin Calcium] Nausea Only and Other (See Comments)    Myalgias   Fenofibrate Diarrhea and Nausea And Vomiting   Simvastatin Nausea Only and Other (See Comments)  Myalgias   Soy Allergy Diarrhea and Nausea And Vomiting   Statins Nausea Only and Other (See Comments)     Myalgias   Meloxicam Rash      MEDICATIONS:  Current Outpatient Medications on File Prior to Visit  Medication Sig Dispense Refill   albuterol (VENTOLIN HFA) 108 (90 Base) MCG/ACT inhaler Inhale 2 puffs into Megan lungs every 6 (six) hours as needed for wheezing or shortness of breath. 6.7 g 2   aspirin EC 81 MG tablet Take 1 tablet (81 mg total) by mouth daily. Swallow whole. 90 tablet 3   diclofenac (VOLTAREN) 75 MG EC tablet Take 1 tablet (75 mg total) by mouth 2 (two) times  daily. 60 tablet 1   diclofenac sodium (VOLTAREN) 1 % GEL Apply 2 g topically 4 (four) times daily.     ezetimibe (ZETIA) 10 MG tablet TAKE 1 TABLET(10 MG) BY MOUTH DAILY (Murray taking differently: Take 10 mg by mouth daily. TAKE 1 TABLET(10 MG) BY MOUTH DAILY) 90 tablet 2   FLUoxetine (PROZAC) 40 MG capsule TAKE 1 CAPSULE(40 MG) BY MOUTH DAILY 90 capsule 0   lidocaine (LIDODERM) 5 % Place 2 patches onto Megan skin daily. Suggest neck/shoulders- Remove & Discard patch within 12 hours or as directed by MD 60 patch 5   losartan (COZAAR) 25 MG tablet Take 1 tablet (25 mg total) by mouth daily. 30 tablet 3   metoprolol tartrate (LOPRESSOR) 100 MG tablet Take 2 hours prior to CT 1 tablet 0   pantoprazole (PROTONIX) 40 MG tablet TAKE 1 TABLET(40 MG) BY MOUTH DAILY (Murray taking differently: Take 40 mg by mouth daily.) 90 tablet 1   Semaglutide, 2 MG/DOSE, 8 MG/3ML SOPN Inject 2 mg as directed once a week. 3 mL 2   SYNJARDY XR 25-1000 MG TB24 TAKE 1 TABLET BY MOUTH DAILY 90 tablet 1   traMADol (ULTRAM) 50 MG tablet 1 tab po q day prn severe pain (Murray taking differently: Take 50 mg by mouth daily as needed for moderate pain or severe pain. 1 tab po q day prn severe pain) 30 tablet 0   zolpidem (AMBIEN) 10 MG tablet TAKE 1 TABLET BY MOUTH AT BEDTIME AS NEEDED FOR INSOMNIA (Murray taking differently: Take 10 mg by mouth at bedtime as needed. TAKE 1 TABLET BY MOUTH AT BEDTIME AS NEEDED FOR INSOMNIA) 30 tablet 1   No current facility-administered medications on file prior to visit.     PAST SURGICAL HISTORY Past Surgical History:  Procedure Laterality Date   ABDOMINAL HYSTERECTOMY     REDUCTION MAMMAPLASTY      FAMILY HISTORY: Family History  Problem Relation Age of Onset   COPD Mother        Died at 23   Heart failure Mother        Not sure of details   Esophageal cancer Father        Survived cancer treatment and surgery   Drug abuse Father    Cirrhosis Father        NAFLD   Healthy  Sister    Diabetes Brother    Depression Brother    Heart attack Brother    Heart disease Brother    Diabetes Maternal Grandmother    Arthritis Maternal Grandmother    Cancer Paternal Grandmother    Hearing loss Paternal Grandmother    Liver disease Paternal Grandfather    Diabetes Maternal Grandfather     SOCIAL HISTORY:  reports that Megan Murray has never smoked. Megan Murray  has never used smokeless tobacco. Megan Murray reports that Megan Murray does not drink alcohol and does not use drugs.  PERFORMANCE STATUS: Megan Murray's performance status is 1 - Symptomatic but completely ambulatory  PHYSICAL EXAM: Most Recent Vital Signs: Blood pressure 136/68, pulse 83, temperature 98.5 F (36.9 C), temperature source Oral, resp. rate 18, height 5\' 1"  (1.549 m), weight 180 lb 1.3 oz (81.7 kg), SpO2 98 %. BP 136/68 (BP Location: Left Arm, Murray Position: Sitting)   Pulse 83   Temp 98.5 F (36.9 C) (Oral)   Resp 18   Ht 5\' 1"  (1.549 m)   Wt 180 lb 1.3 oz (81.7 kg)   SpO2 98%   BMI 34.03 kg/m   General Appearance:    Alert, cooperative, no distress, appears stated age  Head:    Normocephalic, without obvious abnormality, atraumatic  Eyes:    PERRL, conjunctiva/corneas clear, EOM's intact, fundi    benign, both eyes        Throat:   Lips, mucosa, and tongue normal; teeth and gums normal  Neck:   Supple, symmetrical, trachea midline, no adenopathy;    thyroid:  no enlargement/tenderness/nodules; no carotid   bruit or JVD  Back:     Symmetric, no curvature, ROM normal, no CVA tenderness  Lungs:     Clear to auscultation bilaterally, respirations unlabored  Chest Wall:    No tenderness or deformity   Heart:    Regular rate and rhythm, S1 and S2 normal, no murmur, rub   or gallop     Abdomen:     Soft, non-tender, bowel sounds active all four quadrants,    no masses, no organomegaly        Extremities:   Extremities normal, atraumatic, no cyanosis or edema  Pulses:   2+ and symmetric all extremities  Skin:    Skin color, texture, turgor normal, no rashes or lesions  Lymph nodes:   Cervical, supraclavicular, and axillary nodes normal  Neurologic:   CNII-XII intact, normal strength, sensation and reflexes    throughout    LABORATORY DATA:  Results for orders placed or performed in visit on 03/06/21 (from Megan past 48 hour(s))  CBC with Differential (Cancer Center Only)     Status: Abnormal   Collection Time: 03/06/21  8:53 AM  Result Value Ref Range   WBC Count 8.6 4.0 - 10.5 K/uL   RBC 5.40 (H) 3.87 - 5.11 MIL/uL   Hemoglobin 16.3 (H) 12.0 - 15.0 g/dL   HCT 03/08/21 (H) 03/08/21 - 33.2 %   MCV 87.6 80.0 - 100.0 fL   MCH 30.2 26.0 - 34.0 pg   MCHC 34.5 30.0 - 36.0 g/dL   RDW 95.1 88.4 - 16.6 %   Platelet Count 238 150 - 400 K/uL   nRBC 0.0 0.0 - 0.2 %   Neutrophils Relative % 59 %   Neutro Abs 5.1 1.7 - 7.7 K/uL   Lymphocytes Relative 31 %   Lymphs Abs 2.7 0.7 - 4.0 K/uL   Monocytes Relative 6 %   Monocytes Absolute 0.5 0.1 - 1.0 K/uL   Eosinophils Relative 2 %   Eosinophils Absolute 0.1 0.0 - 0.5 K/uL   Basophils Relative 1 %   Basophils Absolute 0.0 0.0 - 0.1 K/uL   Immature Granulocytes 1 %   Abs Immature Granulocytes 0.10 (H) 0.00 - 0.07 K/uL    Comment: Performed at [facility] at [facility], [address], [place], [address] [place]  CMP (Cancer Center only)     Status: Abnormal   Collection Time: 03/06/21  8:53 AM  Result Value Ref Range   Sodium 138 135 - 145 mmol/L   Potassium 3.4 (L) 3.5 - 5.1 mmol/L   Chloride 101 98 - 111 mmol/L   CO2 26 22 - 32 mmol/L   Glucose, Bld 221 (H) 70 - 99 mg/dL    Comment: Glucose reference range applies only to samples taken after fasting for at least 8 hours.   BUN 11 6 - 20 mg/dL   Creatinine 3.23 5.57 - 1.00 mg/dL   Calcium 9.6 8.9 - 32.2 mg/dL   Total Protein 7.1 6.5 - 8.1 g/dL   Albumin 4.3 3.5 - 5.0 g/dL   AST 12 (L) 15 - 41 U/L   ALT 15 0 - 44 U/L   Alkaline Phosphatase 80 38 - 126 U/L   Total Bilirubin  0.7 0.3 - 1.2 mg/dL   GFR, Estimated >02 >54 mL/min    Comment: (NOTE) Calculated using Megan CKD-EPI Creatinine Equation (2021)    Anion gap 11 5 - 15    Comment: Performed at [facility] Lab at [facility], [address], [place], [place] [address]  Lactate dehydrogenase (LDH)     Status: None   Collection Time: 03/06/21  8:53 AM  Result Value Ref Range   LDH 126 98 - 192 U/L    Comment: Performed at [facility] Lab at [facility], [address], [place], [place] [address]  Save Smear [facility])     Status: None   Collection Time: 03/06/21  8:53 AM  Result Value Ref Range   Smear Review SMEAR STAINED AND AVAILABLE FOR REVIEW     Comment: Performed at [facility] Lab at [facility], [address] [place], [place] [address]  Reticulocytes     Status: Abnormal   Collection Time: 03/06/21  8:53 AM  Result Value Ref Range   Retic Ct Pct 1.9 0.4 - 3.1 %   RBC. 5.44 (H) 3.87 - 5.11 MIL/uL   Retic Count, Absolute 100.6 19.0 - 186.0 K/uL   Immature Retic Fract 16.5 (H) 2.3 - 15.9 %    Comment: Performed at [facility] Lab at [facility], [address], [place], [place] [address]      RADIOGRAPHY: No results found.     PATHOLOGY: None  ASSESSMENT/PLAN: Ms. Bene is a very pleasant 55 yo caucasian female with history of mild erythrocytosis noted since 2017.  JAK2 and Erythropoietin level are pending. Megan Murray state that Megan Murray past Hemochromatosis DNA testing was negative.  Blood smear reviewed with MD and no abnormality or evidence of malignancy noted. Cells appear to be well developed.  We will see what Megan Murray pending lab work reveals. If work up negative we will release Megan Murray back to Megan Murray PCP and recommend Megan Murray had a sleep study done to assess for sleep apnea.   All questions were answered. Megan Murray knows to call Megan clinic with any problems, questions or concerns. We can certainly see Megan Murray much  sooner if necessary.  Megan Murray was discussed with Dr. Myna Hidalgo and he is in agreement with Megan aforementioned.   Emeline Gins, NP     Addendum: JAK-2 testing was negative. At this time we would recommend Megan Murray follow-up with Megan Murray PCP and schedule a sleep study to rule out sleep apnea. No follow-up needed with  our office at this time. Megan Murray can certainly contact our office with any future hem/onc questions or concerns.

## 2021-03-06 NOTE — Telephone Encounter (Signed)
Reaching out to patient to offer assistance regarding upcoming cardiac imaging study; pt verbalizes understanding of appt date/time, parking situation and where to check in, pre-test NPO status and medications ordered, and verified current allergies; name and call back number provided for further questions should they arise Deniya Craigo RN Navigator Cardiac Imaging [facility] [phone] office [phone] cell  Denies iv issues Some claustro 100mg metoprolol  

## 2021-03-06 NOTE — Telephone Encounter (Signed)
Attempted to call patient regarding upcoming cardiac CT appointment. °Left message on voicemail with name and callback number °Jeena Arnett RN Navigator Cardiac Imaging °[facility] °[phone] Office °[phone] Cell ° °

## 2021-03-06 NOTE — Telephone Encounter (Signed)
Patient notified of results.

## 2021-03-06 NOTE — Telephone Encounter (Signed)
-----   Message from Georgeanna Lea, MD sent at 03/05/2021 10:17 AM EDT ----- Chem-7 looks good

## 2021-03-07 LAB — ERYTHROPOIETIN: Erythropoietin: 7.4 m[IU]/mL (ref 2.6–18.5)

## 2021-03-09 ENCOUNTER — Telehealth: Payer: Self-pay | Admitting: *Deleted

## 2021-03-09 NOTE — Telephone Encounter (Signed)
No 03/06/21 LOS 

## 2021-03-10 ENCOUNTER — Other Ambulatory Visit: Payer: Self-pay | Admitting: Medical

## 2021-03-10 ENCOUNTER — Other Ambulatory Visit: Payer: Self-pay

## 2021-03-10 ENCOUNTER — Ambulatory Visit (HOSPITAL_COMMUNITY)
Admission: RE | Admit: 2021-03-10 | Discharge: 2021-03-10 | Disposition: A | Discharge status: Home / Self Care | Payer: Medicare Other | Source: Ambulatory Visit | Attending: Cardiology | Admitting: Cardiology

## 2021-03-10 DIAGNOSIS — Z006 Encounter for examination for normal comparison and control in clinical research program: Secondary | ICD-10-CM

## 2021-03-10 DIAGNOSIS — I2510 Atherosclerotic heart disease of native coronary artery without angina pectoris: Secondary | ICD-10-CM

## 2021-03-10 DIAGNOSIS — M159 Polyosteoarthritis, unspecified: Secondary | ICD-10-CM

## 2021-03-10 DIAGNOSIS — R079 Chest pain, unspecified: Secondary | ICD-10-CM | POA: Insufficient documentation

## 2021-03-10 DIAGNOSIS — M8949 Other hypertrophic osteoarthropathy, multiple sites: Secondary | ICD-10-CM

## 2021-03-10 DIAGNOSIS — M797 Fibromyalgia: Secondary | ICD-10-CM

## 2021-03-10 MED ORDER — NITROGLYCERIN 0.4 MG SL SUBL: 0.8000 mg | SUBLINGUAL_TABLET | Freq: Once | SUBLINGUAL | Status: DC

## 2021-03-10 MED ORDER — METOPROLOL TARTRATE 5 MG/5ML IV SOLN: 5.0000 mg | INTRAVENOUS | Status: DC | PRN

## 2021-03-10 MED ORDER — NITROGLYCERIN 0.4 MG SL SUBL
SUBLINGUAL_TABLET | SUBLINGUAL | Status: AC
  Filled 2021-03-10: qty 2

## 2021-03-10 MED ORDER — METOPROLOL TARTRATE 5 MG/5ML IV SOLN
INTRAVENOUS | Status: AC
  Filled 2021-03-10: qty 10

## 2021-03-10 MED ORDER — IOHEXOL 350 MG/ML SOLN
100.0000 mL | Freq: Once | INTRAVENOUS | Status: AC | PRN
  Administered 2021-03-10: 100 mL via INTRAVENOUS

## 2021-03-10 NOTE — Research (Signed)
IDENTIFY Informed Consent                  Subject Name: Megan Murray. [place]   Subject met inclusion and exclusion criteria.  The informed consent form, study requirements and expectations were reviewed with the subject and questions and concerns were addressed prior to the signing of the consent form.  The subject verbalized understanding of the trial requirements.  The subject agreed to participate in the IDENTIFY trial and signed the informed consent at 08:43am on 03/10/21.  The informed consent was obtained prior to performance of any protocol-specific procedures for the subject.  A copy of the signed informed consent was given to the subject and a copy was placed in the subject's medical record.    Marylou Mccoy, Research Coordinator

## 2021-03-11 ENCOUNTER — Ambulatory Visit (HOSPITAL_COMMUNITY): Payer: Medicare Other | Attending: Cardiovascular Disease

## 2021-03-11 DIAGNOSIS — R0609 Other forms of dyspnea: Secondary | ICD-10-CM

## 2021-03-11 DIAGNOSIS — R5383 Other fatigue: Secondary | ICD-10-CM | POA: Diagnosis present

## 2021-03-11 DIAGNOSIS — R0600 Dyspnea, unspecified: Secondary | ICD-10-CM | POA: Diagnosis present

## 2021-03-11 LAB — ECHOCARDIOGRAM COMPLETE
Area-P 1/2: 3.58 cm2
S' Lateral: 1.8 cm

## 2021-03-11 NOTE — Telephone Encounter (Addendum)
Requesting: tramadol 50mg   Contract: 11/03/2020 UDS: 11/03/2020 Last Visit: 02/02/2021 Next Visit: 05/06/2021 Last Refill: 01/22/2021, #30 no RF  Please Advise  Rx tramadol sent to pt pharmacy.

## 2021-03-19 LAB — JAK 2 V617F (GENPATH)

## 2021-03-20 ENCOUNTER — Telehealth: Payer: Self-pay | Admitting: Family

## 2021-03-20 NOTE — Telephone Encounter (Signed)
No answer, left message with call back number to go over JAK 2 testing and plan of care moving forward.

## 2021-03-23 ENCOUNTER — Other Ambulatory Visit: Payer: Self-pay

## 2021-03-23 DIAGNOSIS — F5101 Primary insomnia: Secondary | ICD-10-CM

## 2021-03-24 ENCOUNTER — Telehealth: Payer: Self-pay | Admitting: Medical

## 2021-03-24 NOTE — Telephone Encounter (Signed)
Left message for patient to call back and schedule Medicare Annual Wellness Visit (AWV) in office.  ° °If not able to come in office, please offer to do virtually or by telephone.  Left office number and my jabber #[phone]. ° °Due for AWVI ° °Please schedule at anytime with Nurse Health Advisor. °  °

## 2021-04-08 ENCOUNTER — Ambulatory Visit (HOSPITAL_BASED_OUTPATIENT_CLINIC_OR_DEPARTMENT_OTHER)
Admission: RE | Admit: 2021-04-08 | Discharge: 2021-04-08 | Disposition: A | Discharge status: Home / Self Care | Payer: Medicare Other | Source: Ambulatory Visit | Attending: Physician Assistant | Admitting: Physician Assistant

## 2021-04-08 ENCOUNTER — Other Ambulatory Visit: Payer: Self-pay

## 2021-04-08 ENCOUNTER — Encounter (HOSPITAL_BASED_OUTPATIENT_CLINIC_OR_DEPARTMENT_OTHER): Payer: Self-pay | Admitting: Radiology

## 2021-04-08 DIAGNOSIS — Z1231 Encounter for screening mammogram for malignant neoplasm of breast: Secondary | ICD-10-CM | POA: Insufficient documentation

## 2021-04-09 ENCOUNTER — Encounter: Payer: Self-pay | Admitting: Medical

## 2021-04-13 ENCOUNTER — Other Ambulatory Visit: Payer: Self-pay | Admitting: Physician Assistant

## 2021-04-13 DIAGNOSIS — R928 Other abnormal and inconclusive findings on diagnostic imaging of breast: Secondary | ICD-10-CM

## 2021-04-17 ENCOUNTER — Ambulatory Visit: Payer: Medicare Other

## 2021-04-18 ENCOUNTER — Other Ambulatory Visit: Payer: Self-pay | Admitting: Medical

## 2021-04-18 DIAGNOSIS — E119 Type 2 diabetes mellitus without complications: Secondary | ICD-10-CM

## 2021-04-20 ENCOUNTER — Other Ambulatory Visit: Payer: Self-pay | Admitting: Medical

## 2021-04-20 DIAGNOSIS — F5101 Primary insomnia: Secondary | ICD-10-CM

## 2021-04-21 ENCOUNTER — Other Ambulatory Visit: Payer: Self-pay | Admitting: Physician Assistant

## 2021-04-21 ENCOUNTER — Ambulatory Visit
Admission: RE | Admit: 2021-04-21 | Discharge: 2021-04-21 | Disposition: A | Discharge status: Home / Self Care | Payer: Medicare Other | Source: Ambulatory Visit | Attending: Medical | Admitting: Medical

## 2021-04-21 ENCOUNTER — Other Ambulatory Visit: Payer: Self-pay

## 2021-04-21 DIAGNOSIS — R928 Other abnormal and inconclusive findings on diagnostic imaging of breast: Secondary | ICD-10-CM

## 2021-04-21 DIAGNOSIS — R921 Mammographic calcification found on diagnostic imaging of breast: Secondary | ICD-10-CM

## 2021-04-22 ENCOUNTER — Encounter: Payer: Self-pay | Admitting: Medical

## 2021-04-23 ENCOUNTER — Ambulatory Visit
Admission: RE | Admit: 2021-04-23 | Discharge: 2021-04-23 | Disposition: A | Discharge status: Home / Self Care | Payer: Medicare Other | Source: Ambulatory Visit | Attending: Medical | Admitting: Medical

## 2021-04-23 ENCOUNTER — Other Ambulatory Visit: Payer: Self-pay

## 2021-04-23 DIAGNOSIS — R921 Mammographic calcification found on diagnostic imaging of breast: Secondary | ICD-10-CM

## 2021-04-28 ENCOUNTER — Other Ambulatory Visit: Payer: Self-pay | Admitting: Medical

## 2021-04-29 ENCOUNTER — Encounter: Payer: Self-pay | Admitting: Cardiology

## 2021-04-29 ENCOUNTER — Other Ambulatory Visit: Payer: Self-pay

## 2021-04-29 ENCOUNTER — Ambulatory Visit: Payer: Medicare Other | Admitting: Cardiology

## 2021-04-29 VITALS — BP 130/90 | HR 78 | Ht 61.0 in | Wt 183.0 lb

## 2021-04-29 DIAGNOSIS — I10 Essential (primary) hypertension: Secondary | ICD-10-CM | POA: Diagnosis not present

## 2021-04-29 DIAGNOSIS — E119 Type 2 diabetes mellitus without complications: Secondary | ICD-10-CM | POA: Diagnosis not present

## 2021-04-29 DIAGNOSIS — Z789 Other specified health status: Secondary | ICD-10-CM

## 2021-04-29 DIAGNOSIS — I2510 Atherosclerotic heart disease of native coronary artery without angina pectoris: Secondary | ICD-10-CM

## 2021-04-29 DIAGNOSIS — E781 Pure hyperglyceridemia: Secondary | ICD-10-CM

## 2021-04-29 HISTORY — DX: Atherosclerotic heart disease of native coronary artery without angina pectoris: I25.10

## 2021-04-29 NOTE — Patient Instructions (Signed)
Medication Instructions:  Your physician recommends that you continue on your current medications as directed. Please refer to the Current Medication list given to you today.  *If you need a refill on your cardiac medications before your next appointment, please call your pharmacy*   Lab Work: Your physician recommends that you return for lab work today : Lipid, lft  If you have labs (blood work) drawn today and your tests are completely normal, you will receive your results only by: MyChart Message (if you have MyChart) OR A paper copy in the mail If you have any lab test that is abnormal or we need to change your treatment, we will call you to review the results.   Testing/Procedures: None   Follow-Up: At [facility], you and your health needs are our priority.  As part of our continuing mission to provide you with exceptional heart care, we have created designated Provider Care Teams.  These Care Teams include your primary Cardiologist (physician) and Advanced Practice Providers (APPs -  Physician Assistants and Nurse Practitioners) who all work together to provide you with the care you need, when you need it.  We recommend signing up for the patient portal called "MyChart".  Sign up information is provided on this After Visit Summary.  MyChart is used to connect with patients for Virtual Visits (Telemedicine).  Patients are able to view lab/test results, encounter notes, upcoming appointments, etc.  Non-urgent messages can be sent to your provider as well.   To learn more about what you can do with MyChart, go to [email or web address].    Your next appointment:   5 month(s)  The format for your next appointment:   In Person  Provider:   Gypsy Balsam, MD   Other Instructions

## 2021-04-29 NOTE — Progress Notes (Signed)
Cardiology Office Note:    Date:  04/29/2021   ID:  Megan Murray, DOB 07-04-1966, MRN [id number]  PCP:  Esperanza Richters, PA-C  Cardiologist:  Gypsy Balsam, MD    Referring MD: Esperanza Richters, [place]   Chief Complaint  Patient presents with   Follow-up  I am doing fine  History of Present Illness:    Megan Murray is a 55 y.o. female she was referred to [place] because of multiple risk factors for coronary artery disease namely diabetes 12 years, essential hypertension, dyslipidemia with intolerance to statin.  She did have atypical chest pain as well as some shortness of breath.  Echocardiogram performed showed preserved left ventricle ejection fraction.  She also had coronary CT angio which showed moderate disease in involving circumflex artery.  Sadly, were not able to perform fractional flow reserve analysis. She comes today 2 months for follow-up and discuss those issues.  Overall she says she is doing well.  She is trying to be active and walk some and she denies have any chest pain tightness squeezing pressure burning chest when she walks.  She does have fibromyalgia for many years and she knows how fibromyalgia feels she basically describes symptoms of fibromyalgia.  The key at this stage will be risk factors modifications.  Past Medical History:  Diagnosis Date   Asthma    Diabetes mellitus without complication (HCC)    Dyslipidemia, goal to be determined    High triglycerides and LDL   Fatty liver 05/16/2018   By CT scan   Fibromyalgia    Hyperlipidemia    Hypertension    Major depression, recurrent, chronic (HCC) 04/25/2018   Dr. Evelene Croon   Obesity due to excess calories without serious comorbidity    Osteoarthritis     Past Surgical History:  Procedure Laterality Date   ABDOMINAL HYSTERECTOMY     REDUCTION MAMMAPLASTY      Current Medications: Current Meds  Medication Sig   albuterol (VENTOLIN HFA) 108 (90 Base) MCG/ACT inhaler Inhale 2 puffs into the lungs every 6  (six) hours as needed for wheezing or shortness of breath.   aspirin EC 81 MG tablet Take 1 tablet (81 mg total) by mouth daily. Swallow whole.   diclofenac (VOLTAREN) 75 MG EC tablet Take 1 tablet (75 mg total) by mouth 2 (two) times daily.   diclofenac sodium (VOLTAREN) 1 % GEL Apply 2 g topically 4 (four) times daily.   ezetimibe (ZETIA) 10 MG tablet TAKE 1 TABLET(10 MG) BY MOUTH DAILY (Patient taking differently: Take 10 mg by mouth daily. TAKE 1 TABLET(10 MG) BY MOUTH DAILY)   FLUoxetine (PROZAC) 40 MG capsule TAKE 1 CAPSULE(40 MG) BY MOUTH DAILY (Patient taking differently: Take 40 mg by mouth daily. TAKE 1 CAPSULE(40 MG) BY MOUTH DAILY)   lidocaine (LIDODERM) 5 % Place 2 patches onto the skin daily. Suggest neck/shoulders- Remove & Discard patch within 12 hours or as directed by MD   losartan (COZAAR) 25 MG tablet Take 1 tablet (25 mg total) by mouth daily.   OZEMPIC, 2 MG/DOSE, 8 MG/3ML SOPN INJECT 2MG  INTO THE SKIN AS DIRECTED ONCE WEEKLY (Patient taking differently: Inject 2 mg into the skin once a week.)   pantoprazole (PROTONIX) 40 MG tablet TAKE 1 TABLET(40 MG) BY MOUTH DAILY (Patient taking differently: Take 40 mg by mouth daily.)   SYNJARDY XR 25-1000 MG TB24 TAKE 1 TABLET BY MOUTH DAILY   traMADol (ULTRAM) 50 MG tablet TAKE 1 TABLET BY MOUTH EVERY DAY  AS NEEDED FOR SEVERE PAIN (Patient taking differently: Take 50 mg by mouth as needed for severe pain or moderate pain. TAKE 1 TABLET BY MOUTH EVERY DAY AS NEEDED FOR SEVERE PAIN)   zolpidem (AMBIEN) 10 MG tablet TAKE 1 TABLET BY MOUTH AT BEDTIME AS NEEDED FOR INSOMNIA (Patient taking differently: Take 10 mg by mouth at bedtime as needed for sleep. TAKE 1 TABLET BY MOUTH AT BEDTIME AS NEEDED FOR INSOMNIA)     Allergies:   Codeine, Atorvastatin, Cholestyramine, Crestor [rosuvastatin calcium], Fenofibrate, Simvastatin, Soy allergy, Statins, and Meloxicam   Social History   Socioeconomic History   Marital status: Married    Spouse name:  Not on file   Number of children: 1   Years of education: Not on file   Highest education level: Not on file  Occupational History   Occupation: disabled due to fibromyalgia and depression  Tobacco Use   Smoking status: Never   Smokeless tobacco: Never  Vaping Use   Vaping Use: Never used  Substance and Sexual Activity   Alcohol use: No   Drug use: No   Sexual activity: Yes  Other Topics Concern   Not on file  Social History Narrative   Not on file   Social Determinants of Health   Financial Resource Strain: Not on file  Food Insecurity: Not on file  Transportation Needs: Not on file  Physical Activity: Not on file  Stress: Not on file  Social Connections: Not on file     Family History: The patient's family history includes Arthritis in her maternal grandmother; COPD in her mother; Cancer in her paternal grandmother; Cirrhosis in her father; Depression in her brother; Diabetes in her brother, maternal grandfather, and maternal grandmother; Drug abuse in her father; Esophageal cancer in her father; Healthy in her sister; Hearing loss in her paternal grandmother; Heart attack in her brother; Heart disease in her brother; Heart failure in her mother; Liver disease in her paternal grandfather. ROS:   Please see the history of present illness.    All 14 point review of systems negative except as described per history of present illness  EKGs/Labs/Other Studies Reviewed:      Recent Labs: 02/02/2021: TSH 1.10 03/06/2021: ALT 15; BUN 11; Creatinine 0.47; Hemoglobin 16.3; Platelet Count 238; Potassium 3.4; Sodium 138  Recent Lipid Panel    Component Value Date/Time   CHOL 221 (H) 02/02/2021 1108   TRIG 353.0 (H) 02/02/2021 1108   HDL 46.50 02/02/2021 1108   CHOLHDL 5 02/02/2021 1108   VLDL 70.6 (H) 02/02/2021 1108   LDLCALC 104 03/14/2018 0000   LDLDIRECT 123.0 02/02/2021 1108    Physical Exam:    VS:  BP 130/90 (BP Location: Right Arm, Patient Position: Sitting)    Pulse 78   Ht 5\' 1"  (1.549 m)   Wt 183 lb (83 kg)   SpO2 98%   BMI 34.58 kg/m     Wt Readings from Last 3 Encounters:  04/29/21 183 lb (83 kg)  03/06/21 180 lb 1.3 oz (81.7 kg)  02/20/21 182 lb (82.6 kg)     GEN:  Well nourished, well developed in no acute distress HEENT: Normal NECK: No JVD; No carotid bruits LYMPHATICS: No lymphadenopathy CARDIAC: RRR, no murmurs, no rubs, no gallops RESPIRATORY:  Clear to auscultation without rales, wheezing or rhonchi  ABDOMEN: Soft, non-tender, non-distended MUSCULOSKELETAL:  No edema; No deformity  SKIN: Warm and dry LOWER EXTREMITIES: no swelling NEUROLOGIC:  Alert and oriented x 3 PSYCHIATRIC:  Normal affect   ASSESSMENT:    1. Essential hypertension   2. Coronary artery disease involving native coronary artery of native heart without angina pectoris   3. Type 2 diabetes mellitus without complication, without [place]-term current use of insulin (HCC)   4. Statin intolerance    PLAN:    In order of problems listed above:  Coronary artery disease with right coronary artery having only make mild nonobstructive 25 to 49% stenosis, circumflex artery however had mixed moderate 50 to 70% stenosis in the midportion of circumflex artery.  Fractional flow reserve has not been performed.  The key right now will be risk factors modifications.  She is already on antiplatelet therapy in form of aspirin which I will continue.  She does not have any symptoms of angina.  She is on beta-blocker which I will continue. Dyslipidemia with intolerance to statin.  She is taking Zetia 10 mg daily.  I did review her K PN however I have last LDL from 2019.  Fasting lipid profile will be repeated.  She will be referred to lipid clinic and consideration of more advanced therapy which may include PCSK9 agent. Type 2 diabetes her hemoglobin A1c is 7.4 this is from 02/02/2021 I find those data in the K PN.  Diabetes need to be better controlled I suggested for her to have  appointment with endocrinologist.  She is going to talk to her primary care physician about that. We did talk about healthy lifestyle good diet as well as need to exercise on the regular basis which she is planning to do.   Medication Adjustments/Labs and Tests Ordered: Current medicines are reviewed at length with the patient today.  Concerns regarding medicines are outlined above.  No orders of the defined types were placed in this encounter.  Medication changes: No orders of the defined types were placed in this encounter.   Signed, Georgeanna Lea, MD, [facility] 04/29/2021 8:31 AM    [facility]

## 2021-04-30 ENCOUNTER — Telehealth: Payer: Self-pay | Admitting: Cardiology

## 2021-04-30 LAB — HEPATIC FUNCTION PANEL
ALT: 14 IU/L (ref 0–32)
AST: 10 IU/L (ref 0–40)
Albumin: 4.8 g/dL (ref 3.8–4.9)
Alkaline Phosphatase: 116 IU/L (ref 44–121)
Bilirubin Total: 0.4 mg/dL (ref 0.0–1.2)
Bilirubin, Direct: 0.14 mg/dL (ref 0.00–0.40)
Total Protein: 7.4 g/dL (ref 6.0–8.5)

## 2021-04-30 LAB — LIPID PANEL
Chol/HDL Ratio: 7.3 ratio — ABNORMAL HIGH (ref 0.0–4.4)
Cholesterol, Total: 276 mg/dL — ABNORMAL HIGH (ref 100–199)
HDL: 38 mg/dL — ABNORMAL LOW (ref >39)
LDL Chol Calc (NIH): 109 mg/dL — ABNORMAL HIGH (ref 0–99)
Triglycerides: 734 mg/dL (ref 0–149)
VLDL Cholesterol Cal: 129 mg/dL — ABNORMAL HIGH (ref 5–40)

## 2021-04-30 NOTE — Telephone Encounter (Signed)
Results reviewed with pt as per Dr. Krasowski's note.  Pt verbalized understanding and had no additional questions. Routed to PCP  

## 2021-04-30 NOTE — Telephone Encounter (Signed)
Patient returning call for lab results. 

## 2021-05-02 LAB — LIPID PANEL
Chol/HDL Ratio: 5.5 ratio — ABNORMAL HIGH (ref 0.0–4.4)
Cholesterol, Total: 232 mg/dL — ABNORMAL HIGH (ref 100–199)
HDL: 42 mg/dL (ref >39)
LDL Chol Calc (NIH): 130 mg/dL — ABNORMAL HIGH (ref 0–99)
Triglycerides: 333 mg/dL — ABNORMAL HIGH (ref 0–149)
VLDL Cholesterol Cal: 60 mg/dL — ABNORMAL HIGH (ref 5–40)

## 2021-05-05 ENCOUNTER — Telehealth: Payer: Self-pay | Admitting: Cardiology

## 2021-05-05 NOTE — Telephone Encounter (Signed)
Pt returning phone call for results... please advise °

## 2021-05-05 NOTE — Telephone Encounter (Signed)
Patient informed of results.  

## 2021-05-06 ENCOUNTER — Other Ambulatory Visit: Payer: Self-pay

## 2021-05-06 ENCOUNTER — Ambulatory Visit (INDEPENDENT_AMBULATORY_CARE_PROVIDER_SITE_OTHER): Payer: Medicare Other | Admitting: Medical

## 2021-05-06 VITALS — BP 136/70 | HR 75 | Temp 98.7°F | Resp 18 | Ht 61.0 in | Wt 184.6 lb

## 2021-05-06 DIAGNOSIS — E669 Obesity, unspecified: Secondary | ICD-10-CM

## 2021-05-06 DIAGNOSIS — Z23 Encounter for immunization: Secondary | ICD-10-CM | POA: Diagnosis not present

## 2021-05-06 DIAGNOSIS — E119 Type 2 diabetes mellitus without complications: Secondary | ICD-10-CM | POA: Diagnosis not present

## 2021-05-06 DIAGNOSIS — E1169 Type 2 diabetes mellitus with other specified complication: Secondary | ICD-10-CM

## 2021-05-06 DIAGNOSIS — I10 Essential (primary) hypertension: Secondary | ICD-10-CM | POA: Diagnosis not present

## 2021-05-06 DIAGNOSIS — E66811 Obesity, class 1: Secondary | ICD-10-CM

## 2021-05-06 DIAGNOSIS — R7989 Other specified abnormal findings of blood chemistry: Secondary | ICD-10-CM

## 2021-05-06 DIAGNOSIS — E785 Hyperlipidemia, unspecified: Secondary | ICD-10-CM

## 2021-05-06 LAB — COMPREHENSIVE METABOLIC PANEL
ALT: 17 U/L (ref 0–35)
AST: 15 U/L (ref 0–37)
Albumin: 4.7 g/dL (ref 3.5–5.2)
Alkaline Phosphatase: 89 U/L (ref 39–117)
BUN: 9 mg/dL (ref 6–23)
CO2: 25 mEq/L (ref 19–32)
Calcium: 9.3 mg/dL (ref 8.4–10.5)
Chloride: 102 mEq/L (ref 96–112)
Creatinine, Ser: 0.4 mg/dL (ref 0.40–1.20)
GFR: 110.99 mL/min (ref >60.00)
Glucose, Bld: 146 mg/dL — ABNORMAL HIGH (ref 70–99)
Potassium: 4.3 mEq/L (ref 3.5–5.1)
Sodium: 137 mEq/L (ref 135–145)
Total Bilirubin: 0.5 mg/dL (ref 0.2–1.2)
Total Protein: 7.2 g/dL (ref 6.0–8.3)

## 2021-05-06 LAB — HEMOGLOBIN A1C: Hgb A1c MFr Bld: 7.2 % — ABNORMAL HIGH (ref 4.6–6.5)

## 2021-05-06 NOTE — Patient Instructions (Addendum)
Diabetes with recent increase of A1c above 7.  Previously on [place].  You report you had better sugar control went on Trulicity.  Also you report with Ozempic he never lost weight.  We will get CMP and A1c today.  After review likely will switch you back to Trulicity and discontinue Ozempic.  Hypertension.  Blood pressure decently controlled today.  Much better blood pressure readings at home.  Continue losartan 25 mg daily.  Hyperlipidemia with severe high triglycerides.  Continue Zetia and follow-up with lipid specialist.  Obesity.  Describes failed attempts to lose weight in the past.  Want to offer you referral to weight loss specialist in the future.  If you decide you want referral just let me know. Can try [employer] app during the interim.  For recent abnormal mammogram but negative work-up repeat mammogram on annual basis.  Glad to hear hematologist work up was negative.  Follow-up in 3 months or sooner if needed

## 2021-05-06 NOTE — Progress Notes (Signed)
Subjective:    Patient ID: Megan Murray, female    DOB: Nov 23, 1965, 55 y.o.   MRN: [id number]  HPI Htn. At home her bp have been in 116/76 range consistently. On losartan.  Pt diabetic and on ozempic. Since on ozempic she states her sugars have been higher. Pt states having hard time getting ozempic. No pharmacy have it presently until 05-15-2021. Pt asked to be on ozempic in past(has been on for 2 years). She wanted to switch for wt loss but did not loose. Pt also on synjardy.      Pt has history of very high cholesterol. No cardiac or neurologic signs/symptoms. Pt statin intolerant. Pt is on zetia. Pt is going to see lipid specialist 05-20-2021. Has seen Dr. Dewayne Shorter.  Pt had elevated rbc, hb and hct. Pt seen by hematologist and worked up.  Pt got message only see then on as needed basis. Pt states testing negative.  Pt had mammogram with additional imaging with biopsy. Study showed. Pathology revealed FIBROADENOMA WITH CALCIFICATION. NO ATYPIA OR MALIGNANCY of the LEFT breast, upper outer quadrant, coil clip. This was found to be concordant by Dr   Review of Systems  Constitutional:  Negative for chills, fatigue and fever.  Respiratory:  Negative for cough, chest tightness, shortness of breath and wheezing.   Cardiovascular:  Negative for chest pain and palpitations.  Gastrointestinal:  Negative for abdominal pain.  Endocrine: Negative for polydipsia and polyuria.  Genitourinary:  Negative for difficulty urinating, dysuria, enuresis, flank pain, frequency and urgency.  Musculoskeletal:  Negative for back pain and joint swelling.  Skin:  Negative for rash.  Neurological:  Negative for dizziness, seizures, speech difficulty, weakness, numbness and headaches.  Psychiatric/Behavioral:  Negative for behavioral problems and confusion.     Past Medical History:  Diagnosis Date   Asthma    Diabetes mellitus without complication (HCC)    Dyslipidemia, goal to be determined    High  triglycerides and LDL   Fatty liver 05/16/2018   By CT scan   Fibromyalgia    Hyperlipidemia    Hypertension    Major depression, recurrent, chronic (HCC) 04/25/2018   Dr. Evelene Croon   Obesity due to excess calories without serious comorbidity    Osteoarthritis      Social History   Socioeconomic History   Marital status: Married    Spouse name: Not on file   Number of children: 1   Years of education: Not on file   Highest education level: Not on file  Occupational History   Occupation: disabled due to fibromyalgia and depression  Tobacco Use   Smoking status: Never   Smokeless tobacco: Never  Vaping Use   Vaping Use: Never used  Substance and Sexual Activity   Alcohol use: No   Drug use: No   Sexual activity: Yes  Other Topics Concern   Not on file  Social History Narrative   Not on file   Social Determinants of Health   Financial Resource Strain: Not on file  Food Insecurity: Not on file  Transportation Needs: Not on file  Physical Activity: Not on file  Stress: Not on file  Social Connections: Not on file  Intimate Partner Violence: Not on file    Past Surgical History:  Procedure Laterality Date   ABDOMINAL HYSTERECTOMY     REDUCTION MAMMAPLASTY      Family History  Problem Relation Age of Onset   COPD Mother        Died at  63   Heart failure Mother        Not sure of details   Esophageal cancer Father        Survived cancer treatment and surgery   Drug abuse Father    Cirrhosis Father        NAFLD   Healthy Sister    Diabetes Brother    Depression Brother    Heart attack Brother    Heart disease Brother    Diabetes Maternal Grandmother    Arthritis Maternal Grandmother    Cancer Paternal Grandmother    Hearing loss Paternal Grandmother    Liver disease Paternal Grandfather    Diabetes Maternal Grandfather     Allergies  Allergen Reactions   Codeine Palpitations    Natural or Synthetic Codeine   Atorvastatin Nausea Only and Other (See  Comments)    Myalgias   Cholestyramine Diarrhea and Nausea Only   Crestor [Rosuvastatin Calcium] Nausea Only and Other (See Comments)    Myalgias   Fenofibrate Diarrhea and Nausea And Vomiting   Simvastatin Nausea Only and Other (See Comments)    Myalgias   Soy Allergy Diarrhea and Nausea And Vomiting   Statins Nausea Only and Other (See Comments)     Myalgias   Meloxicam Rash    Current Outpatient Medications on File Prior to Visit  Medication Sig Dispense Refill   albuterol (VENTOLIN HFA) 108 (90 Base) MCG/ACT inhaler Inhale 2 puffs into the lungs every 6 (six) hours as needed for wheezing or shortness of breath. 6.7 g 2   aspirin EC 81 MG tablet Take 1 tablet (81 mg total) by mouth daily. Swallow whole. 90 tablet 3   diclofenac (VOLTAREN) 75 MG EC tablet Take 1 tablet (75 mg total) by mouth 2 (two) times daily. 60 tablet 1   diclofenac sodium (VOLTAREN) 1 % GEL Apply 2 g topically 4 (four) times daily.     ezetimibe (ZETIA) 10 MG tablet TAKE 1 TABLET(10 MG) BY MOUTH DAILY (Patient taking differently: Take 10 mg by mouth daily. TAKE 1 TABLET(10 MG) BY MOUTH DAILY) 90 tablet 2   FLUoxetine (PROZAC) 40 MG capsule TAKE 1 CAPSULE(40 MG) BY MOUTH DAILY (Patient taking differently: Take 40 mg by mouth daily. TAKE 1 CAPSULE(40 MG) BY MOUTH DAILY) 90 capsule 0   lidocaine (LIDODERM) 5 % Place 2 patches onto the skin daily. Suggest neck/shoulders- Remove & Discard patch within 12 hours or as directed by MD 60 patch 5   losartan (COZAAR) 25 MG tablet Take 1 tablet (25 mg total) by mouth daily. 30 tablet 3   OZEMPIC, 2 MG/DOSE, 8 MG/3ML SOPN INJECT 2MG  INTO THE SKIN AS DIRECTED ONCE WEEKLY (Patient taking differently: Inject 2 mg into the skin once a week.) 3 mL 2   pantoprazole (PROTONIX) 40 MG tablet TAKE 1 TABLET(40 MG) BY MOUTH DAILY (Patient taking differently: Take 40 mg by mouth daily.) 90 tablet 1   SYNJARDY XR 25-1000 MG TB24 TAKE 1 TABLET BY MOUTH DAILY 90 tablet 1   traMADol (ULTRAM) 50  MG tablet TAKE 1 TABLET BY MOUTH EVERY DAY AS NEEDED FOR SEVERE PAIN (Patient taking differently: Take 50 mg by mouth as needed for severe pain or moderate pain. TAKE 1 TABLET BY MOUTH EVERY DAY AS NEEDED FOR SEVERE PAIN) 30 tablet 0   zolpidem (AMBIEN) 10 MG tablet TAKE 1 TABLET BY MOUTH AT BEDTIME AS NEEDED FOR INSOMNIA (Patient taking differently: Take 10 mg by mouth at bedtime as needed for sleep.  TAKE 1 TABLET BY MOUTH AT BEDTIME AS NEEDED FOR INSOMNIA) 30 tablet 0   No current facility-administered medications on file prior to visit.    BP 136/70   Pulse 75   Temp 98.7 F (37.1 C)   Resp 18   Ht 5\' 1"  (1.549 m)   Wt 184 lb 9.6 oz (83.7 kg)   SpO2 97%   BMI 34.88 kg/m       Objective:   Physical Exam  General Mental Status- Alert. General Appearance- Not in acute distress.   Skin General: Color- Normal Color. Moisture- Normal Moisture.  Neck Carotid Arteries- Normal color. Moisture- Normal Moisture. No carotid bruits. No JVD.  Chest and Lung Exam Auscultation: Breath Sounds:-Normal.  Cardiovascular Auscultation:Rythm- Regular. Murmurs & Other Heart Sounds:Auscultation of the heart reveals- No Murmurs.  Abdomen Inspection:-Inspeection Normal. Palpation/Percussion:Note:No mass. Palpation and Percussion of the abdomen reveal- Non Tender, Non Distended + BS, no rebound or guarding.   Neurologic Cranial Nerve exam:- CN III-XII intact(No nystagmus), symmetric smile. Strength:- 5/5 equal and symmetric strength both upper and lower extremities.       Assessment & Plan:   Patient Instructions  Diabetes with recent increase of A1c above 7.  Previously on .  You report you had better sugar control went on Trulicity.  Also you report with Ozempic he never lost weight.  We will get CMP and A1c today.  After review likely will switch you back to Trulicity and discontinue Ozempic.  Hypertension.  Blood pressure decently controlled today.  Much better  blood pressure readings at home.  Continue losartan 25 mg daily.  Hyperlipidemia with severe high triglycerides.  Continue Zetia and follow-up with lipid specialist.  Obesity.  Describes failed attempts to lose weight in the past.  Want to offer you referral to weight loss specialist in the future.  If you decide you want referral just let me know.  For recent abnormal mammogram but negative work-up repeat mammogram on annual basis.  Follow-up in 3 months or sooner if needed

## 2021-05-07 MED ORDER — TRULICITY 1.5 MG/0.5ML ~~LOC~~ SOAJ: SUBCUTANEOUS | 11 refills | Status: DC

## 2021-05-07 NOTE — Addendum Note (Signed)
Addended by: Gwenevere Abbot on: 05/07/2021 03:55 PM   Modules accepted: Orders

## 2021-05-13 ENCOUNTER — Encounter: Payer: Self-pay | Admitting: Medical

## 2021-05-14 ENCOUNTER — Encounter: Payer: Self-pay | Admitting: Family

## 2021-05-14 ENCOUNTER — Telehealth (INDEPENDENT_AMBULATORY_CARE_PROVIDER_SITE_OTHER): Payer: Medicare Other | Admitting: Family

## 2021-05-14 VITALS — Ht 61.0 in | Wt 184.5 lb

## 2021-05-14 DIAGNOSIS — U071 COVID-19: Secondary | ICD-10-CM

## 2021-05-14 HISTORY — DX: COVID-19: U07.1

## 2021-05-14 MED ORDER — MOLNUPIRAVIR EUA 200MG CAPSULE: 4.0000 | ORAL_CAPSULE | Freq: Two times a day (BID) | ORAL | 0 refills | Status: AC

## 2021-05-14 NOTE — Progress Notes (Signed)
MyChart Video Visit    Virtual Visit via Video Note   This visit type was conducted due to national recommendations for restrictions regarding the COVID-19 Pandemic (e.g. social distancing) in an effort to limit this patient's exposure and mitigate transmission in our community. This patient is at least at moderate risk for complications without adequate follow up. This format is felt to be most appropriate for this patient at this time. Physical exam was limited by quality of the video and audio technology used for the visit. CMA was able to get the patient set up on a video visit.  Patient location: Home. Patient and provider in visit Provider location: Office  I discussed the limitations of evaluation and management by telemedicine and the availability of in person appointments. The patient expressed understanding and agreed to proceed.  Visit Date: 05/14/2021  Today's healthcare provider: Dulce Sellar, NP     Subjective:    Patient ID: Megan Murray, female    DOB: 10-13-1965, 55 y.o.   MRN: [id number]  Chief Complaint  Patient presents with   Covid Positive    Symptoms started Tuesday evening. She has taken Ibuprofen. She denies fever, CP, SOB.   Nasal Congestion   Cough   Headache   HPI: Upper Respiratory Infection: Patient complains of symptoms of a URI. Symptoms include congestion, no  fever, and non productive cough. Onset of symptoms was 2 days ago, unchanged since that time. She is drinking moderate amounts of fluids. Evaluation to date: none. Treatment to date:  Ibuprofen .    Past Medical History:  Diagnosis Date   Asthma    Diabetes mellitus without complication (HCC)    Dyslipidemia, goal to be determined    High triglycerides and LDL   Fatty liver 05/16/2018   By CT scan   Fibromyalgia    Hyperlipidemia    Hypertension    Major depression, recurrent, chronic (HCC) 04/25/2018   Dr. Evelene Croon   Obesity due to excess calories without serious  comorbidity    Osteoarthritis     Past Surgical History:  Procedure Laterality Date   ABDOMINAL HYSTERECTOMY     REDUCTION MAMMAPLASTY      Outpatient Medications Prior to Visit  Medication Sig Dispense Refill   albuterol (VENTOLIN HFA) 108 (90 Base) MCG/ACT inhaler Inhale 2 puffs into the lungs every 6 (six) hours as needed for wheezing or shortness of breath. 6.7 g 2   aspirin EC 81 MG tablet Take 1 tablet (81 mg total) by mouth daily. Swallow whole. 90 tablet 3   diclofenac (VOLTAREN) 75 MG EC tablet Take 1 tablet (75 mg total) by mouth 2 (two) times daily. 60 tablet 1   diclofenac sodium (VOLTAREN) 1 % GEL Apply 2 g topically 4 (four) times daily.     Dulaglutide (TRULICITY) 1.5 MG/0.5ML SOPN 1.5 mg(0.5 ml) injection weekly 2 mL 11   ezetimibe (ZETIA) 10 MG tablet TAKE 1 TABLET(10 MG) BY MOUTH DAILY (Patient taking differently: Take 10 mg by mouth daily. TAKE 1 TABLET(10 MG) BY MOUTH DAILY) 90 tablet 2   FLUoxetine (PROZAC) 40 MG capsule TAKE 1 CAPSULE(40 MG) BY MOUTH DAILY (Patient taking differently: Take 40 mg by mouth daily. TAKE 1 CAPSULE(40 MG) BY MOUTH DAILY) 90 capsule 0   lidocaine (LIDODERM) 5 % Place 2 patches onto the skin daily. Suggest neck/shoulders- Remove & Discard patch within 12 hours or as directed by MD 60 patch 5   losartan (COZAAR) 25 MG tablet Take 1 tablet (  25 mg total) by mouth daily. 30 tablet 3   pantoprazole (PROTONIX) 40 MG tablet TAKE 1 TABLET(40 MG) BY MOUTH DAILY (Patient taking differently: Take 40 mg by mouth daily.) 90 tablet 1   SYNJARDY XR 25-1000 MG TB24 TAKE 1 TABLET BY MOUTH DAILY 90 tablet 1   traMADol (ULTRAM) 50 MG tablet TAKE 1 TABLET BY MOUTH EVERY DAY AS NEEDED FOR SEVERE PAIN (Patient taking differently: Take 50 mg by mouth as needed for severe pain or moderate pain. TAKE 1 TABLET BY MOUTH EVERY DAY AS NEEDED FOR SEVERE PAIN) 30 tablet 0   zolpidem (AMBIEN) 10 MG tablet TAKE 1 TABLET BY MOUTH AT BEDTIME AS NEEDED FOR INSOMNIA (Patient taking  differently: Take 10 mg by mouth at bedtime as needed for sleep. TAKE 1 TABLET BY MOUTH AT BEDTIME AS NEEDED FOR INSOMNIA) 30 tablet 0   No facility-administered medications prior to visit.    Allergies  Allergen Reactions   Codeine Palpitations    Natural or Synthetic Codeine   Atorvastatin Nausea Only and Other (See Comments)    Myalgias   Cholestyramine Diarrhea and Nausea Only   Crestor [Rosuvastatin Calcium] Nausea Only and Other (See Comments)    Myalgias   Fenofibrate Diarrhea and Nausea And Vomiting   Simvastatin Nausea Only and Other (See Comments)    Myalgias   Soy Allergy Diarrhea and Nausea And Vomiting   Statins Nausea Only and Other (See Comments)     Myalgias   Meloxicam Rash        Objective:    Physical Exam Vitals and nursing note reviewed.  Constitutional:      General: She is not in acute distress.    Appearance: Normal appearance.  HENT:     Head: Normocephalic.  Pulmonary:     Effort: No respiratory distress.  Musculoskeletal:     Cervical back: Normal range of motion.  Skin:    General: Skin is dry.     Coloration: Skin is not pale.  Neurological:     Mental Status: She is alert and oriented to person, place, and time.  Psychiatric:        Mood and Affect: Mood normal.    Ht 5\' 1"  (1.549 m)   Wt 184 lb 8.4 oz (83.7 kg)   BMI 34.87 kg/m   Wt Readings from Last 3 Encounters:  05/14/21 184 lb 8.4 oz (83.7 kg)  05/06/21 184 lb 9.6 oz (83.7 kg)  04/29/21 183 lb (83 kg)       Assessment & Plan:   Problem List Items Addressed This Visit       Other   COVID-19 - Primary    Sending Molnupiravir, advised on use & SE. Advised of CDC guidelines for self isolation/ ending isolation.  Advised of safe practice guidelines. Symptom Tier reviewed.  Encouraged to monitor for any worsening symptoms; watch for increased shortness of breath, weakness, and signs of dehydration. Advised when to seek emergency care.  Instructed to rest and hydrate well.   Advised to leave the house during recommended isolation period, only if it is necessary to seek medical care       Relevant Medications   molnupiravir EUA (LAGEVRIO) 200 mg CAPS capsule    Meds ordered this encounter  Medications   molnupiravir EUA (LAGEVRIO) 200 mg CAPS capsule    Sig: Take 4 capsules (800 mg total) by mouth 2 (two) times daily for 5 days.    Dispense:  40 capsule  Refill:  0    Order Specific Question:   Supervising Provider    Answer:   ANDY, CAMILLE L [[id number]]    I discussed the assessment and treatment plan with the patient. The patient was provided an opportunity to ask questions and all were answered. The patient agreed with the plan and demonstrated an understanding of the instructions.   The patient was advised to call back or seek an in-person evaluation if the symptoms worsen or if the condition fails to improve as anticipated.  I provided 20 minutes of face-to-face time during this encounter.   Dulce Sellar, NP [facility] [place] [phone] (phone) [phone] (fax)  [facility]

## 2021-05-14 NOTE — Assessment & Plan Note (Signed)
Sending Molnupiravir, advised on use & SE. Advised of CDC guidelines for self isolation/ ending isolation.  Advised of safe practice guidelines. Symptom Tier reviewed.  Encouraged to monitor for any worsening symptoms; watch for increased shortness of breath, weakness, and signs of dehydration. Advised when to seek emergency care.  Instructed to rest and hydrate well.  Advised to leave the house during recommended isolation period, only if it is necessary to seek medical care

## 2021-05-17 ENCOUNTER — Other Ambulatory Visit: Payer: Self-pay | Admitting: Medical

## 2021-05-17 DIAGNOSIS — E118 Type 2 diabetes mellitus with unspecified complications: Secondary | ICD-10-CM

## 2021-05-20 ENCOUNTER — Ambulatory Visit: Payer: Medicare Other

## 2021-05-21 ENCOUNTER — Other Ambulatory Visit: Payer: Self-pay | Admitting: Medical

## 2021-05-21 DIAGNOSIS — F5101 Primary insomnia: Secondary | ICD-10-CM

## 2021-05-22 NOTE — Telephone Encounter (Signed)
I refilled patient's Ambien.  If she up-to-date on her controlled medication contract for Ambien?  For all controlled meds refill request please give me pertinent information.

## 2021-05-23 ENCOUNTER — Other Ambulatory Visit: Payer: Self-pay | Admitting: Medical

## 2021-05-28 ENCOUNTER — Other Ambulatory Visit: Payer: Self-pay | Admitting: Medical

## 2021-05-28 DIAGNOSIS — F339 Major depressive disorder, recurrent, unspecified: Secondary | ICD-10-CM

## 2021-06-02 ENCOUNTER — Other Ambulatory Visit: Payer: Self-pay | Admitting: Medical

## 2021-06-08 ENCOUNTER — Other Ambulatory Visit: Payer: Self-pay

## 2021-06-08 ENCOUNTER — Ambulatory Visit: Payer: Medicare Other | Admitting: Pharmacist Clinician (PhC)/ Clinical Pharmacy Specialist

## 2021-06-08 DIAGNOSIS — I2510 Atherosclerotic heart disease of native coronary artery without angina pectoris: Secondary | ICD-10-CM | POA: Diagnosis not present

## 2021-06-08 NOTE — Progress Notes (Signed)
   06/08/2021 Megan Murray  1966-07-09 [id number]    HPI: Megan Murray is a 55 year old female and patient of Dr. Bing Matter, who presents today for a lipid clinic evaluation.  See pertinent past medical history below. At last visit with Dr. Bing Matter on 10/19, no medication changes were made.    Past Medical History: CAD Coronary calcium score at 94th percentile; CAD with RCA 25-50% stenosed, circumflex 50-70% stenosed   HLD  Zetia 10 mg    HTN   Losartan 25 mg daily   T2DM  Trulicity 1.5 mg weekly    Fibromyalgia   Diclofenac 75 mg BID, Diclofenac gel 2g QID, Tramadol 50 mg prn pain    Current Medications: Zetia 10 mg daily    Cholesterol Goals: TC <200, TG <150, LDL <55, HDL >40   Intolerant/previously tried: Atorvastatin and Rosuvastatin   Family history: The patient's family hx includes arthritis in maternal grandmother; COPD in mother; cancer in paternal grandmother; cirrhosis in father; depression in brother; diabetes in her brother, maternal grandfather, and maternal grandmother; drug abuse in her father; esophageal cancer in her father; heart attack in brother; heart disease in her brother; heart failure in her mother; liver disease in paternal grandfather   Exercise: Has been unable to exercise due to fibromyalgia    There were no vitals taken for this visit.   Assessment and Plan:  Patient's LDL elevated at 130 based on October labs. Discussed the mechanism of action, dosing, pricing, and side effects of repatha/pralulent, nexletol, and levqio. Based off this conversation, patient decided to move forward with repatha. Prior authorization will be sent to insurance so patient can begin medication. Will recheck lipid panel in 8-12 weeks.   Asencion Gowda PharmD candidate  [employer] Class of 2023  I was with student and patient throughout entire visit and agree with above assessment and plan  Phillips Hay PharmD CPP [facility]

## 2021-06-08 NOTE — Patient Instructions (Addendum)
Your Results:             Your most recent labs Goal  Total Cholesterol 232 < 200  Triglycerides 333 < 150  HDL (happy/good cholesterol) 42 > 40  LDL (lousy/bad cholesterol 130 < 55   Medication changes:  We will start the process to get Repatha covered by insurance.  Once approved, inject 1 pen every 14 days.   Lab orders:  We will check cholesterol and liver function tests after 8-12 weeks   THANK YOU FOR CHOOSING [facility]

## 2021-06-08 NOTE — Assessment & Plan Note (Addendum)
Patient's LDL elevated at 130 based on October labs. Discussed the mechanism of action, dosing, pricing, and side effects of repatha/pralulent, nexletol, and levqio. Based off this conversation, patient decided to move forward with repatha. Prior authorization will be sent to insurance so patient can begin medication. Will recheck lipid panel in 8-12 weeks.

## 2021-06-16 ENCOUNTER — Encounter: Payer: Self-pay | Admitting: Family

## 2021-06-16 ENCOUNTER — Ambulatory Visit (HOSPITAL_BASED_OUTPATIENT_CLINIC_OR_DEPARTMENT_OTHER)
Admission: RE | Admit: 2021-06-16 | Discharge: 2021-06-16 | Disposition: A | Discharge status: Home / Self Care | Payer: Medicare Other | Source: Ambulatory Visit | Attending: Family | Admitting: Family

## 2021-06-16 ENCOUNTER — Other Ambulatory Visit: Payer: Self-pay

## 2021-06-16 ENCOUNTER — Ambulatory Visit (INDEPENDENT_AMBULATORY_CARE_PROVIDER_SITE_OTHER): Payer: Medicare Other | Admitting: Family

## 2021-06-16 VITALS — BP 144/60 | HR 95 | Temp 98.1°F | Ht 61.0 in | Wt 189.8 lb

## 2021-06-16 DIAGNOSIS — J019 Acute sinusitis, unspecified: Secondary | ICD-10-CM | POA: Diagnosis not present

## 2021-06-16 DIAGNOSIS — R058 Other specified cough: Secondary | ICD-10-CM | POA: Diagnosis present

## 2021-06-16 MED ORDER — PREDNISONE 20 MG PO TABS: 20.0000 mg | ORAL_TABLET | Freq: Every day | ORAL | 0 refills | Status: DC

## 2021-06-16 MED ORDER — PROMETHAZINE-DM 6.25-15 MG/5ML PO SYRP: 5.0000 mL | ORAL_SOLUTION | Freq: Four times a day (QID) | ORAL | 0 refills | Status: DC | PRN

## 2021-06-16 MED ORDER — DOXYCYCLINE HYCLATE 100 MG PO TABS: 100.0000 mg | ORAL_TABLET | Freq: Two times a day (BID) | ORAL | 0 refills | Status: DC

## 2021-06-16 NOTE — Progress Notes (Signed)
Megan Murray is a 55 y.o. female with the following history as recorded in EpicCare:  Patient Active Problem List   Diagnosis Date Noted   COVID-19 05/14/2021   Coronary artery disease moderate based on coronary CT angio of circumflex 04/29/2021   Dyspnea on exertion 02/20/2021   Atypical chest pain 02/20/2021   Obesity due to excess calories without serious comorbidity 02/19/2021   Dyslipidemia, goal to be determined 02/19/2021   Diabetes mellitus (HCC) 02/19/2021   Reactive airway disease 02/19/2021   Myofascial pain 11/12/2019   Primary osteoarthritis of left knee 11/12/2019   Primary osteoarthritis of right knee 11/12/2019   Cough variant asthma 08/10/2019   Chronic fatigue 04/26/2019   Gastroesophageal reflux disease without esophagitis 04/26/2019   Statin intolerance 06/21/2018   Seasonal allergic rhinitis due to pollen 05/19/2018   Fatty liver 05/16/2018   Fibromyalgia 04/25/2018   Major depression, recurrent, chronic (HCC) 04/25/2018   Osteoarthritis, multiple sites 04/25/2018   Essential hypertension 11/09/2016   Type 2 diabetes mellitus without complication, without Cude-term current use of insulin (HCC) 11/09/2016   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   Mixed hyperlipidemia 11/04/2016   B12 deficiency 01/23/2015    Current Outpatient Medications  Medication Sig Dispense Refill   albuterol (VENTOLIN HFA) 108 (90 Base) MCG/ACT inhaler Inhale 2 puffs into the lungs every 6 (six) hours as needed for wheezing or shortness of breath. 6.7 g 2   aspirin EC 81 MG tablet Take 1 tablet (81 mg total) by mouth daily. Swallow whole. 90 tablet 3   diclofenac (VOLTAREN) 75 MG EC tablet Take 1 tablet (75 mg total) by mouth 2 (two) times daily. 60 tablet 1   diclofenac sodium (VOLTAREN) 1 % GEL Apply 2 g topically 4 (four) times daily.     doxycycline (VIBRA-TABS) 100 MG tablet Take 1 tablet (100 mg total) by mouth 2 (two) times daily. 14 tablet 0   Dulaglutide (TRULICITY) 1.5  MG/0.5ML SOPN 1.5 mg(0.5 ml) injection weekly 2 mL 11   ezetimibe (ZETIA) 10 MG tablet TAKE 1 TABLET(10 MG) BY MOUTH DAILY 90 tablet 2   FLUoxetine (PROZAC) 40 MG capsule TAKE 1 CAPSULE(40 MG) BY MOUTH DAILY 90 capsule 0   lidocaine (LIDODERM) 5 % Place 2 patches onto the skin daily. Suggest neck/shoulders- Remove & Discard patch within 12 hours or as directed by MD 60 patch 5   losartan (COZAAR) 25 MG tablet TAKE 1 TABLET(25 MG) BY MOUTH DAILY 30 tablet 3   pantoprazole (PROTONIX) 40 MG tablet TAKE 1 TABLET(40 MG) BY MOUTH DAILY (Patient taking differently: Take 40 mg by mouth daily.) 90 tablet 1   predniSONE (DELTASONE) 20 MG tablet Take 1 tablet (20 mg total) by mouth daily with breakfast. 5 tablet 0   promethazine-dextromethorphan (PROMETHAZINE-DM) 6.25-15 MG/5ML syrup Take 5 mLs by mouth 4 (four) times daily as needed. Cough syrup. 240 mL 0   SYNJARDY XR 25-1000 MG TB24 TAKE 1 TABLET BY MOUTH DAILY 90 tablet 1   traMADol (ULTRAM) 50 MG tablet TAKE 1 TABLET BY MOUTH EVERY DAY AS NEEDED FOR SEVERE PAIN (Patient taking differently: Take 50 mg by mouth as needed for severe pain or moderate pain. TAKE 1 TABLET BY MOUTH EVERY DAY AS NEEDED FOR SEVERE PAIN) 30 tablet 0   zolpidem (AMBIEN) 10 MG tablet TAKE 1 TABLET BY MOUTH AT BEDTIME AS NEEDED FOR INSOMNIA 30 tablet 2   No current facility-administered medications for this visit.    Allergies: Codeine, Atorvastatin, Cholestyramine, Crestor [rosuvastatin calcium], Fenofibrate,  Simvastatin, Soy allergy, Statins, and Meloxicam  Past Medical History:  Diagnosis Date   Asthma    Diabetes mellitus without complication (HCC)    Dyslipidemia, goal to be determined    High triglycerides and LDL   Fatty liver 05/16/2018   By CT scan   Fibromyalgia    Hyperlipidemia    Hypertension    Major depression, recurrent, chronic (HCC) 04/25/2018   Dr. Evelene Croon   Obesity due to excess calories without serious comorbidity    Osteoarthritis     Past Surgical  History:  Procedure Laterality Date   ABDOMINAL HYSTERECTOMY     REDUCTION MAMMAPLASTY      Family History  Problem Relation Age of Onset   COPD Mother        Died at 98   Heart failure Mother        Not sure of details   Esophageal cancer Father        Survived cancer treatment and surgery   Drug abuse Father    Cirrhosis Father        NAFLD   Healthy Sister    Diabetes Brother    Depression Brother    Heart attack Brother    Heart disease Brother    Diabetes Maternal Grandmother    Arthritis Maternal Grandmother    Cancer Paternal Grandmother    Hearing loss Paternal Grandmother    Liver disease Paternal Grandfather    Diabetes Maternal Grandfather     Social History   Tobacco Use   Smoking status: Never   Smokeless tobacco: Never  Substance Use Topics   Alcohol use: No    Subjective:  Patient was diagnosed with COVID in early November; notes that she has had persistent cough since COVID diagnosis; notes that her cough sounds like a "smoker's cough."  Had COVID in 2020 and required albuterol; has been using albuterol for the past week;  Using Promethazine DM cough syrup to help her sleep;       Objective:  Vitals:   06/16/21 1251  BP: (!) 144/60  Pulse: 95  Temp: 98.1 F (36.7 C)  TempSrc: Oral  SpO2: 97%  Weight: 189 lb 12.8 oz (86.1 kg)  Height: 5\' 1"  (1.549 m)    General: Well developed, well nourished, in no acute distress  Skin : Warm and dry.  Head: Normocephalic and atraumatic  Eyes: Sclera and conjunctiva clear; pupils round and reactive to light; extraocular movements intact  Ears: External normal; canals clear; tympanic membranes normal  Oropharynx: Pink, supple. No suspicious lesions  Neck: Supple without thyromegaly, adenopathy  Lungs: Respirations unlabored; clear to auscultation bilaterally without wheeze, rales, rhonchi  CVS exam: normal rate and regular rhythm.  Neurologic: Alert and oriented; speech intact; face symmetrical; moves  all extremities well; CNII-XII intact without focal deficit  Assessment:  1. Cough present for greater than 3 weeks   2. Acute sinusitis, recurrence not specified, unspecified location     Plan:  Update CXR; Rx for Doxcycline and Prednisone; increase fluids, rest and follow up worse, no better.   This visit occurred during the SARS-CoV-2 public health emergency.  Safety protocols were in place, including screening questions prior to the visit, additional usage of staff PPE, and extensive cleaning of exam room while observing appropriate contact time as indicated for disinfecting solutions.    No follow-ups on file.  Orders Placed This Encounter  Procedures   DG Chest 2 View    Standing Status:   Future  Standing Expiration Date:   06/16/2022    Order Specific Question:   Reason for Exam (SYMPTOM  OR DIAGNOSIS REQUIRED)    Answer:   cough x 1 month; COVID in early November 202    Order Specific Question:   Is patient pregnant?    Answer:   No    Order Specific Question:   Preferred imaging location?    Answer:   [facility]    Requested Prescriptions   Signed Prescriptions Disp Refills   doxycycline (VIBRA-TABS) 100 MG tablet 14 tablet 0    Sig: Take 1 tablet (100 mg total) by mouth 2 (two) times daily.   predniSONE (DELTASONE) 20 MG tablet 5 tablet 0    Sig: Take 1 tablet (20 mg total) by mouth daily with breakfast.   promethazine-dextromethorphan (PROMETHAZINE-DM) 6.25-15 MG/5ML syrup 240 mL 0    Sig: Take 5 mLs by mouth 4 (four) times daily as needed. Cough syrup.

## 2021-06-17 ENCOUNTER — Encounter: Payer: Self-pay | Admitting: Family

## 2021-06-23 ENCOUNTER — Other Ambulatory Visit: Payer: Self-pay

## 2021-06-23 MED ORDER — FLUCONAZOLE 200 MG PO TABS: 200.0000 mg | ORAL_TABLET | Freq: Every day | ORAL | 0 refills | Status: DC

## 2021-07-06 ENCOUNTER — Other Ambulatory Visit: Payer: Self-pay | Admitting: Medical

## 2021-07-06 DIAGNOSIS — M159 Polyosteoarthritis, unspecified: Secondary | ICD-10-CM

## 2021-07-06 DIAGNOSIS — M797 Fibromyalgia: Secondary | ICD-10-CM

## 2021-07-07 ENCOUNTER — Telehealth: Payer: Self-pay | Admitting: Medical

## 2021-07-07 MED ORDER — TRAMADOL HCL 50 MG PO TABS: 50.0000 mg | ORAL_TABLET | Freq: Three times a day (TID) | ORAL | 0 refills | Status: DC | PRN

## 2021-07-07 MED ORDER — TRAMADOL HCL 50 MG PO TABS: ORAL_TABLET | ORAL | 0 refills | Status: DC

## 2021-07-07 NOTE — Telephone Encounter (Addendum)
Requesting: tramadol Contract:10/2020 UDS:11/03/2020 Last Visit:06/16/21 Next Visit:07/17/21 Last Refill:03/13/21  Please Advise   Refilled rx today.   Esperanza Richters, PA-C

## 2021-07-07 NOTE — Telephone Encounter (Signed)
I tried to send tramadol electronically and I think I printed the rx last night. Please get it off the printer and shred. If you can't find the rx then call pharmacy and make sure they only fill one rx and shred the other. Explain did not mean to send 2 times.

## 2021-07-08 NOTE — Telephone Encounter (Signed)
Script put in shred 

## 2021-07-10 ENCOUNTER — Telehealth: Payer: Self-pay | Admitting: *Deleted

## 2021-07-10 NOTE — Telephone Encounter (Signed)
Prior auth started via cover my meds.  Awaiting determination.   Key: [id number]

## 2021-07-13 ENCOUNTER — Encounter: Payer: Self-pay | Admitting: Pharmacist Clinician (PhC)/ Clinical Pharmacy Specialist

## 2021-07-13 DIAGNOSIS — E785 Hyperlipidemia, unspecified: Secondary | ICD-10-CM

## 2021-07-13 DIAGNOSIS — Z789 Other specified health status: Secondary | ICD-10-CM

## 2021-07-13 DIAGNOSIS — E781 Pure hyperglyceridemia: Secondary | ICD-10-CM

## 2021-07-14 MED ORDER — REPATHA SURECLICK 140 MG/ML ~~LOC~~ SOAJ: 140.0000 mg | SUBCUTANEOUS | 11 refills | Status: DC

## 2021-07-14 NOTE — Telephone Encounter (Signed)
Reference #: [id number]  This medication or product is on your plan's list of covered drugs. Prior authorization is not required at this time. If your pharmacy has questions regarding the processing of your prescription, please have them call the [facility] pharmacy help desk at [phone].

## 2021-07-14 NOTE — Telephone Encounter (Signed)
This encounter was created in error - please disregard.

## 2021-07-19 ENCOUNTER — Other Ambulatory Visit: Payer: Self-pay | Admitting: Medical

## 2021-07-20 NOTE — Telephone Encounter (Addendum)
Requesting: tramadol Contract:11/03/2020 UDS:11/03/20 Last Visit:06/16/21 Next Visit:08/06/21 Last Refill:  Please Advise  Rx refill sent.  Esperanza Richters, PA-C

## 2021-08-06 ENCOUNTER — Ambulatory Visit: Payer: Medicare Other | Admitting: Medical

## 2021-08-06 NOTE — Telephone Encounter (Signed)
[facility] called requesting diagnosis verification. Form filled out and faxed back to them for verification.

## 2021-08-10 ENCOUNTER — Other Ambulatory Visit: Payer: Self-pay | Admitting: Pharmacist Clinician (PhC)/ Clinical Pharmacy Specialist

## 2021-08-10 DIAGNOSIS — E785 Hyperlipidemia, unspecified: Secondary | ICD-10-CM

## 2021-08-10 DIAGNOSIS — Z789 Other specified health status: Secondary | ICD-10-CM

## 2021-08-10 DIAGNOSIS — E781 Pure hyperglyceridemia: Secondary | ICD-10-CM

## 2021-08-13 ENCOUNTER — Other Ambulatory Visit: Payer: Self-pay | Admitting: Medical

## 2021-08-14 NOTE — Telephone Encounter (Addendum)
Requesting:tramadol Contract:11/03/20 UDS:11/03/20 Last Visit:06/16/21 Next Visit:n/a Last Refill:07/20/21  Please Advise   Rx refill. Have pt follow up this month for controlled med visit.  Mackie Pai, PA-C

## 2021-08-21 ENCOUNTER — Other Ambulatory Visit: Payer: Self-pay | Admitting: Medical

## 2021-08-21 DIAGNOSIS — F5101 Primary insomnia: Secondary | ICD-10-CM

## 2021-08-24 NOTE — Telephone Encounter (Addendum)
Requesting: Ambien 10mg   Contract:11/03/2020 UDS: 11/03/2020 Last Visit: 05/06/2021 Next Visit: None Last Refill: 05/22/2021 #30 and 2RF  Please Advise  Rx refill sent to pharmacy.  13/05/2021, PA-C

## 2021-08-28 ENCOUNTER — Other Ambulatory Visit: Payer: Self-pay | Admitting: Medical

## 2021-08-28 ENCOUNTER — Telehealth: Payer: Self-pay | Admitting: Medical

## 2021-08-28 DIAGNOSIS — F339 Major depressive disorder, recurrent, unspecified: Secondary | ICD-10-CM

## 2021-08-28 NOTE — Telephone Encounter (Signed)
Left message for patient to call back and schedule Medicare Annual Wellness Visit (AWV) in office.  ° °If not able to come in office, please offer to do virtually or by telephone.  Left office number and my jabber #[phone]. ° °Due for AWVI ° °Please schedule at anytime with Nurse Health Advisor. °  °

## 2021-09-04 ENCOUNTER — Other Ambulatory Visit: Payer: Self-pay | Admitting: Medical

## 2021-09-15 ENCOUNTER — Ambulatory Visit (INDEPENDENT_AMBULATORY_CARE_PROVIDER_SITE_OTHER): Payer: Medicare Other

## 2021-09-15 DIAGNOSIS — Z0000 Encounter for general adult medical examination without abnormal findings: Secondary | ICD-10-CM | POA: Diagnosis not present

## 2021-09-15 NOTE — Patient Instructions (Signed)
Megan Murray , Thank you for taking time to come for your Medicare Wellness Visit. I appreciate your ongoing commitment to your health goals. Please review the following plan we discussed and let me know if I can assist you in the future.   Screening recommendations/referrals: Colonoscopy: 01/08/18 due 01/09/28 Mammogram: 04/21/21 Bone Density: N/A Recommended yearly ophthalmology/optometry visit for glaucoma screening and checkup Recommended yearly dental visit for hygiene and checkup  Vaccinations: Influenza vaccine: up to date Pneumococcal vaccine: up to date Tdap vaccine: up to date Shingles vaccine: Due-May obtain vaccine at your local pharmacy.   Covid-19: Due-May obtain vaccine at your local pharmacy.   Advanced directives: Yes, will bring to next visit  Conditions/risks identified: see problem list  Next appointment: Follow up in one year for your annual wellness visit.   Preventive Care 40-64 Years, Female Preventive care refers to lifestyle choices and visits with your health care provider that can promote health and wellness. What does preventive care include? A yearly physical exam. This is also called an annual well check. Dental exams once or twice a year. Routine eye exams. Ask your health care provider how often you should have your eyes checked. Personal lifestyle choices, including: Daily care of your teeth and gums. Regular physical activity. Eating a healthy diet. Avoiding tobacco and drug use. Limiting alcohol use. Practicing safe sex. Taking low-dose aspirin daily starting at age 78. Taking vitamin and mineral supplements as recommended by your health care provider. What happens during an annual well check? The services and screenings done by your health care provider during your annual well check will depend on your age, overall health, lifestyle risk factors, and family history of disease. Counseling  Your health care provider may ask you questions about  your: Alcohol use. Tobacco use. Drug use. Emotional well-being. Home and relationship well-being. Sexual activity. Eating habits. Work and work Statistician. Method of birth control. Menstrual cycle. Pregnancy history. Screening  You may have the following tests or measurements: Height, weight, and BMI. Blood pressure. Lipid and cholesterol levels. These may be checked every 5 years, or more frequently if you are over 5 years old. Skin check. Lung cancer screening. You may have this screening every year starting at age 19 if you have a 30-pack-year history of smoking and currently smoke or have quit within the past 15 years. Fecal occult blood test (FOBT) of the stool. You may have this test every year starting at age 52. Flexible sigmoidoscopy or colonoscopy. You may have a sigmoidoscopy every 5 years or a colonoscopy every 10 years starting at age 20. Hepatitis C blood test. Hepatitis B blood test. Sexually transmitted disease (STD) testing. Diabetes screening. This is done by checking your blood sugar (glucose) after you have not eaten for a while (fasting). You may have this done every 1-3 years. Mammogram. This may be done every 1-2 years. Talk to your health care provider about when you should start having regular mammograms. This may depend on whether you have a family history of breast cancer. BRCA-related cancer screening. This may be done if you have a family history of breast, ovarian, tubal, or peritoneal cancers. Pelvic exam and Pap test. This may be done every 3 years starting at age 32. Starting at age 34, this may be done every 5 years if you have a Pap test in combination with an HPV test. Bone density scan. This is done to screen for osteoporosis. You may have this scan if you are at high risk  for osteoporosis. Discuss your test results, treatment options, and if necessary, the need for more tests with your health care provider. Vaccines  Your health care provider may  recommend certain vaccines, such as: Influenza vaccine. This is recommended every year. Tetanus, diphtheria, and acellular pertussis (Tdap, Td) vaccine. You may need a Td booster every 10 years. Zoster vaccine. You may need this after age 93. Pneumococcal 13-valent conjugate (PCV13) vaccine. You may need this if you have certain conditions and were not previously vaccinated. Pneumococcal polysaccharide (PPSV23) vaccine. You may need one or two doses if you smoke cigarettes or if you have certain conditions. Talk to your health care provider about which screenings and vaccines you need and how often you need them. This information is not intended to replace advice given to you by your health care provider. Make sure you discuss any questions you have with your health care provider. Document Released: 07/25/2015 Document Revised: 03/17/2016 Document Reviewed: 04/29/2015 Elsevier Interactive Patient Education  2017 [place] Prevention in the Home Falls can cause injuries. They can happen to people of all ages. There are many things you can do to make your home safe and to help prevent falls. What can I do on the outside of my home? Regularly fix the edges of walkways and driveways and fix any cracks. Remove anything that might make you trip as you walk through a door, such as a raised step or threshold. Trim any bushes or trees on the path to your home. Use bright outdoor lighting. Clear any walking paths of anything that might make someone trip, such as rocks or tools. Regularly check to see if handrails are loose or broken. Make sure that both sides of any steps have handrails. Any raised decks and porches should have guardrails on the edges. Have any leaves, snow, or ice cleared regularly. Use sand or salt on walking paths during winter. Clean up any spills in your garage right away. This includes oil or grease spills. What can I do in the bathroom? Use night lights. Install  grab bars by the toilet and in the tub and shower. Do not use towel bars as grab bars. Use non-skid mats or decals in the tub or shower. If you need to sit down in the shower, use a plastic, non-slip stool. Keep the floor dry. Clean up any water that spills on the floor as soon as it happens. Remove soap buildup in the tub or shower regularly. Attach bath mats securely with double-sided non-slip rug tape. Do not have throw rugs and other things on the floor that can make you trip. What can I do in the bedroom? Use night lights. Make sure that you have a light by your bed that is easy to reach. Do not use any sheets or blankets that are too big for your bed. They should not hang down onto the floor. Have a firm chair that has side arms. You can use this for support while you get dressed. Do not have throw rugs and other things on the floor that can make you trip. What can I do in the kitchen? Clean up any spills right away. Avoid walking on wet floors. Keep items that you use a lot in easy-to-reach places. If you need to reach something above you, use a strong step stool that has a grab bar. Keep electrical cords out of the way. Do not use floor polish or wax that makes floors slippery. If you must  use wax, use non-skid floor wax. Do not have throw rugs and other things on the floor that can make you trip. What can I do with my stairs? Do not leave any items on the stairs. Make sure that there are handrails on both sides of the stairs and use them. Fix handrails that are broken or loose. Make sure that handrails are as [place] as the stairways. Check any carpeting to make sure that it is firmly attached to the stairs. Fix any carpet that is loose or worn. Avoid having throw rugs at the top or bottom of the stairs. If you do have throw rugs, attach them to the floor with carpet tape. Make sure that you have a light switch at the top of the stairs and the bottom of the stairs. If you do not have  them, ask someone to add them for you. What else can I do to help prevent falls? Wear shoes that: Do not have high heels. Have rubber bottoms. Are comfortable and fit you well. Are closed at the toe. Do not wear sandals. If you use a stepladder: Make sure that it is fully opened. Do not climb a closed stepladder. Make sure that both sides of the stepladder are locked into place. Ask someone to hold it for you, if possible. Clearly mark and make sure that you can see: Any grab bars or handrails. First and last steps. Where the edge of each step is. Use tools that help you move around (mobility aids) if they are needed. These include: Canes. Walkers. Scooters. Crutches. Turn on the lights when you go into a dark area. Replace any light bulbs as soon as they burn out. Set up your furniture so you have a clear path. Avoid moving your furniture around. If any of your floors are uneven, fix them. If there are any pets around you, be aware of where they are. Review your medicines with your doctor. Some medicines can make you feel dizzy. This can increase your chance of falling. Ask your doctor what other things that you can do to help prevent falls. This information is not intended to replace advice given to you by your health care provider. Make sure you discuss any questions you have with your health care provider. Document Released: 04/24/2009 Document Revised: 12/04/2015 Document Reviewed: 08/02/2014 Elsevier Interactive Patient Education  2017 [employer].

## 2021-09-15 NOTE — Progress Notes (Addendum)
Subjective:   Megan Murray is a 56 y.o. female who presents for an Initial Medicare Annual Wellness Visit. I connected with  Robby SermonLaurie K Murray on 09/15/21 by a audio enabled telemedicine application and verified that I am speaking with the correct person using two identifiers.  Patient Location: Home.  Provider Location: Office/Clinic  I discussed the limitations of evaluation and management by telemedicine. The patient expressed understanding and agreed to proceed.  Review of Systems     Cardiac Risk Factors include: diabetes mellitus     Objective:    Today's Vitals   09/15/21 1324  PainSc: 4    There is no height or weight on file to calculate BMI.  Advanced Directives 09/15/2021 03/06/2021 07/09/2015  Does Patient Have a Medical Advance Directive? Yes Yes No  Type of Estate agentAdvance Directive Healthcare Power of [place];Out of facility DNR (pink MOST or yellow form);Living will Healthcare Power of [place];Living will -  Does patient want to make changes to medical advance directive? - No - Patient declined -  Copy of Healthcare Power of Attorney in Chart? No - copy requested No - copy requested -    Current Medications (verified) Outpatient Encounter Medications as of 09/15/2021  Medication Sig   albuterol (VENTOLIN HFA) 108 (90 Base) MCG/ACT inhaler Inhale 2 puffs into the lungs every 6 (six) hours as needed for wheezing or shortness of breath.   aspirin EC 81 MG tablet Take 1 tablet (81 mg total) by mouth daily. Swallow whole.   diclofenac (VOLTAREN) 75 MG EC tablet Take 1 tablet (75 mg total) by mouth 2 (two) times daily.   diclofenac sodium (VOLTAREN) 1 % GEL Apply 2 g topically 4 (four) times daily.   Dulaglutide (TRULICITY) 1.5 MG/0.5ML SOPN 1.5 mg(0.5 ml) injection weekly   ezetimibe (ZETIA) 10 MG tablet TAKE 1 TABLET(10 MG) BY MOUTH DAILY   FLUoxetine (PROZAC) 40 MG capsule TAKE 1 CAPSULE(40 MG) BY MOUTH DAILY   lidocaine (LIDODERM) 5 % Place 2 patches onto the skin daily.  Suggest neck/shoulders- Remove & Discard patch within 12 hours or as directed by MD   losartan (COZAAR) 25 MG tablet TAKE 1 TABLET(25 MG) BY MOUTH DAILY   pantoprazole (PROTONIX) 40 MG tablet TAKE 1 TABLET(40 MG) BY MOUTH DAILY (Patient taking differently: Take 40 mg by mouth daily.)   traMADol (ULTRAM) 50 MG tablet TAKE 1 TABLET(50 MG) BY MOUTH EVERY 8 HOURS FOR UP TO 5 DAYS AS NEEDED   zolpidem (AMBIEN) 10 MG tablet TAKE 1 TABLET BY MOUTH AT BEDTIME AS NEEDED FOR INSOMNIA   [DISCONTINUED] doxycycline (VIBRA-TABS) 100 MG tablet Take 1 tablet (100 mg total) by mouth 2 (two) times daily.   [DISCONTINUED] Evolocumab (REPATHA SURECLICK) 140 MG/ML SOAJ Inject 140 mg into the skin every 14 (fourteen) days.   [DISCONTINUED] fluconazole (DIFLUCAN) 200 MG tablet Take 1 tablet (200 mg total) by mouth daily. Take one tablet for symptoms of yeast infection. In 3 days take 2nd tablet if not better.   [DISCONTINUED] predniSONE (DELTASONE) 20 MG tablet Take 1 tablet (20 mg total) by mouth daily with breakfast.   [DISCONTINUED] promethazine-dextromethorphan (PROMETHAZINE-DM) 6.25-15 MG/5ML syrup Take 5 mLs by mouth 4 (four) times daily as needed. Cough syrup.   [DISCONTINUED] SYNJARDY XR 25-1000 MG TB24 TAKE 1 TABLET BY MOUTH DAILY   No facility-administered encounter medications on file as of 09/15/2021.    Allergies (verified) Codeine, Atorvastatin, Cholestyramine, Crestor [rosuvastatin calcium], Fenofibrate, Simvastatin, Soy allergy, Statins, Repatha [evolocumab], and Meloxicam  History: Past Medical History:  Diagnosis Date   Asthma    Diabetes mellitus without complication (HCC)    Dyslipidemia, goal to be determined    High triglycerides and LDL   Fatty liver 05/16/2018   By CT scan   Fibromyalgia    Hyperlipidemia    Hypertension    Major depression, recurrent, chronic (HCC) 04/25/2018   Dr. Evelene Croon   Obesity due to excess calories without serious comorbidity    Osteoarthritis    Past Surgical  History:  Procedure Laterality Date   ABDOMINAL HYSTERECTOMY     REDUCTION MAMMAPLASTY     Family History  Problem Relation Age of Onset   COPD Mother        Died at 71   Heart failure Mother        Not sure of details   Esophageal cancer Father        Survived cancer treatment and surgery   Drug abuse Father    Cirrhosis Father        NAFLD   Healthy Sister    Diabetes Brother    Depression Brother    Heart attack Brother    Heart disease Brother    Diabetes Maternal Grandmother    Arthritis Maternal Grandmother    Cancer Paternal Grandmother    Hearing loss Paternal Grandmother    Liver disease Paternal Grandfather    Diabetes Maternal Grandfather    Social History   Socioeconomic History   Marital status: Married    Spouse name: Not on file   Number of children: 1   Years of education: Not on file   Highest education level: Not on file  Occupational History   Occupation: disabled due to fibromyalgia and depression  Tobacco Use   Smoking status: Never   Smokeless tobacco: Never  Vaping Use   Vaping Use: Never used  Substance and Sexual Activity   Alcohol use: No   Drug use: No   Sexual activity: Yes  Other Topics Concern   Not on file  Social History Narrative   Not on file   Social Determinants of Health   Financial Resource Strain: Not on file  Food Insecurity: Not on file  Transportation Needs: Not on file  Physical Activity: Not on file  Stress: Not on file  Social Connections: Not on file    Tobacco Counseling Counseling given: Not Answered   Clinical Intake:  Pre-visit preparation completed: Yes  Pain : 0-10 Pain Score: 4  Pain Location: Other (Comment) Pain Descriptors / Indicators: Aching Pain Onset: More than a month ago Pain Frequency: Constant     Nutritional Risks: None Diabetes: Yes CBG done?: No Did pt. bring in CBG monitor from home?: No  How often do you need to have someone help you when you read instructions,  pamphlets, or other written materials from your doctor or pharmacy?: 1 - Never  Diabetic?Yes Nutrition Risk Assessment:  Has the patient had any N/V/D within the last 2 months?  No  Does the patient have any non-healing wounds?  No  Has the patient had any unintentional weight loss or weight gain?  No   Diabetes:  Is the patient diabetic?  Yes  If diabetic, was a CBG obtained today?  No  Did the patient bring in their glucometer from home?  No  How often do you monitor your CBG's? Once dail.   Financial Strains and Diabetes Management:  Are you having any financial strains with the device, your  supplies or your medication? No .  Does the patient want to be seen by Chronic Care Management for management of their diabetes?  No  Would the patient like to be referred to a Nutritionist or for Diabetic Management?  No   Diabetic Exams:  Diabetic Eye Exam: Completed 01/02/21 Diabetic Foot Exam: Completed 02/02/21    Interpreter Needed?: No  Information entered by :: Saber Dickerman   Activities of Daily Living In your present state of health, do you have any difficulty performing the following activities: 09/15/2021  Hearing? N  Vision? N  Difficulty concentrating or making decisions? N  Walking or climbing stairs? Y  Dressing or bathing? N  Doing errands, shopping? N  Preparing Food and eating ? N  Using the Toilet? N  In the past six months, have you accidently leaked urine? N  Do you have problems with loss of bowel control? N  Managing your Medications? N  Managing your Finances? N  Housekeeping or managing your Housekeeping? N  Some recent data might be hidden    Patient Care Team: Saguier, Kateri McEdward, PA-C as PCP - General (Internal Medicine) Milagros EvenerKaur, Rupinder, MD as Consulting Physician (Psychiatry) Marykay LexHarding, David W, MD as Consulting Physician (Cardiology) Zenovia JordanHawkes, Angela, MD as Consulting Physician (Rheumatology)  Indicate any recent Medical Services you may have received  from other than [facility] providers in the past year (date may be approximate).     Assessment:   This is a routine wellness examination for [name].  Hearing/Vision screen No results found.  Dietary issues and exercise activities discussed: Current Exercise Habits: Home exercise routine, Type of exercise: walking, Time (Minutes): 60, Frequency (Times/Week): 7, Weekly Exercise (Minutes/Week): 420, Intensity: Mild, Exercise limited by: None identified   Goals Addressed   None    Depression Screen PHQ 2/9 Scores 09/15/2021 11/03/2020 01/04/2020 11/12/2019 10/24/2019 04/26/2019 10/24/2018  PHQ - 2 Score 0 1 0 4 4 0 0  PHQ- 9 Score - 8 8 8 7 6  -    Fall Risk Fall Risk  09/15/2021 01/04/2020 11/30/2019 11/12/2019 10/24/2018  Falls in the past year? 0 0 0 0 1  Number falls in past yr: 0 - - - 0  Injury with Fall? 0 - - - 0  Risk for fall due to : No Fall Risks - - - -  Follow up Falls evaluation completed - - - Falls evaluation completed    FALL RISK PREVENTION PERTAINING TO THE HOME:  Any stairs in or around the home? No  If so, are there any without handrails? No  Home free of loose throw rugs in walkways, pet beds, electrical cords, etc? Yes  Adequate lighting in your home to reduce risk of falls? Yes   ASSISTIVE DEVICES UTILIZED TO PREVENT FALLS:  Life alert? No  Use of a cane, walker or w/c? No  Grab bars in the bathroom? No  Shower chair or bench in shower? No  Elevated toilet seat or a handicapped toilet? No   TIMED UP AND GO:  Was the test performed? No .    Cognitive Function:     6CIT Screen 09/15/2021  What Year? 0 points  What month? 0 points  What time? 0 points  Count back from 20 0 points  Months in reverse 0 points  Repeat phrase 0 points  Total Score 0    Immunizations Immunization History  Administered Date(s) Administered   Fluad Quad(high Dose 65+) 03/28/2019, 05/06/2021   Influenza-Unspecified 03/12/2018   PFIZER(Purple Top)SARS-COV-2 Vaccination  10/15/2019, 11/05/2019   PNEUMOCOCCAL CONJUGATE-20 02/02/2021   Pneumococcal Polysaccharide-23 01/21/2015   Tdap 03/03/2012, 07/13/2015   Zoster Recombinat (Shingrix) 11/10/2020    TDAP status: Up to date  Flu Vaccine status: Up to date  Pneumococcal vaccine status: Up to date  Covid-19 vaccine status: Declined, Education has been provided regarding the importance of this vaccine but patient still declined. Advised may receive this vaccine at local pharmacy or Health Dept.or vaccine clinic. Aware to provide a copy of the vaccination record if obtained from local pharmacy or Health Dept. Verbalized acceptance and understanding.  Qualifies for Shingles Vaccine? Yes   Zostavax completed No   Shingrix Completed?: No.    Education has been provided regarding the importance of this vaccine. Patient has been advised to call insurance company to determine out of pocket expense if they have not yet received this vaccine. Advised may also receive vaccine at local pharmacy or Health Dept. Verbalized acceptance and understanding.  Screening Tests Health Maintenance  Topic Date Due   Hepatitis C Screening  Never done   COVID-19 Vaccine (3 - Pfizer risk series) 12/03/2019   Zoster Vaccines- Shingrix (2 of 2) 01/05/2021   HEMOGLOBIN A1C  11/04/2021   OPHTHALMOLOGY EXAM  01/02/2022   FOOT EXAM  02/02/2022   MAMMOGRAM  04/21/2022   TETANUS/TDAP  07/12/2025   COLONOSCOPY (Pts 45-52yrs Insurance coverage will need to be confirmed)  01/09/2028   INFLUENZA VACCINE  Completed   HIV Screening  Completed   HPV VACCINES  Aged Out    Health Maintenance  Health Maintenance Due  Topic Date Due   Hepatitis C Screening  Never done   COVID-19 Vaccine (3 - Pfizer risk series) 12/03/2019   Zoster Vaccines- Shingrix (2 of 2) 01/05/2021    Colorectal cancer screening: Type of screening: Colonoscopy. Completed 01/08/2018. Repeat every 10 years  Mammogram status: Completed 04/21/21. Repeat every  year    Lung Cancer Screening: (Low Dose CT Chest recommended if Age 63-80 years, 30 pack-year currently smoking OR have quit w/in 15years.) does not qualify.   Lung Cancer Screening Referral: N/A  Additional Screening:  Hepatitis C Screening: does qualify; Completed Doesn't want  Vision Screening: Recommended annual ophthalmology exams for early detection of glaucoma and other disorders of the eye. Is the patient up to date with their annual eye exam?  Yes  Who is the provider or what is the name of the office in which the patient attends annual eye exams? Dr. Allena Katz If pt is not established with a provider, would they like to be referred to a provider to establish care? No .   Dental Screening: Recommended annual dental exams for proper oral hygiene  Community Resource Referral / Chronic Care Management: CRR required this visit?  No   CCM required this visit?  No      Plan:     I have personally reviewed and noted the following in the patients chart:   Medical and social history Use of alcohol, tobacco or illicit drugs  Current medications and supplements including opioid prescriptions. Patient is not currently taking opioid prescriptions. Functional ability and status Nutritional status Physical activity Advanced directives List of other physicians Hospitalizations, surgeries, and ER visits in previous 12 months Vitals Screenings to include cognitive, depression, and falls Referrals and appointments  In addition, I have reviewed and discussed with patient certain preventive protocols, quality metrics, and best practice recommendations. A written personalized care plan for preventive services as well as general preventive health recommendations  were provided to patient.   Due to this being a telephonic visit, the after visit summary with patients personalized plan was offered to patient via mail or my-chart. Patient would like to access on my-chart/visit.     Salomon Mast Chukwuemeka Artola, CMA   09/15/2021   Nurse Notes: None  Review and Agree with assessment & plan of CMA.  Esperanza Richters, PA-C

## 2021-09-21 ENCOUNTER — Ambulatory Visit (INDEPENDENT_AMBULATORY_CARE_PROVIDER_SITE_OTHER): Payer: Medicare Other | Admitting: Medical

## 2021-09-21 VITALS — BP 150/78 | HR 80 | Temp 98.0°F | Resp 18 | Ht 61.0 in | Wt 185.0 lb

## 2021-09-21 DIAGNOSIS — F339 Major depressive disorder, recurrent, unspecified: Secondary | ICD-10-CM

## 2021-09-21 DIAGNOSIS — F5101 Primary insomnia: Secondary | ICD-10-CM | POA: Diagnosis not present

## 2021-09-21 DIAGNOSIS — E1169 Type 2 diabetes mellitus with other specified complication: Secondary | ICD-10-CM

## 2021-09-21 DIAGNOSIS — K219 Gastro-esophageal reflux disease without esophagitis: Secondary | ICD-10-CM

## 2021-09-21 DIAGNOSIS — I10 Essential (primary) hypertension: Secondary | ICD-10-CM | POA: Diagnosis not present

## 2021-09-21 DIAGNOSIS — E119 Type 2 diabetes mellitus without complications: Secondary | ICD-10-CM | POA: Diagnosis not present

## 2021-09-21 DIAGNOSIS — E785 Hyperlipidemia, unspecified: Secondary | ICD-10-CM | POA: Diagnosis not present

## 2021-09-21 DIAGNOSIS — Z79899 Other long term (current) drug therapy: Secondary | ICD-10-CM

## 2021-09-21 DIAGNOSIS — M797 Fibromyalgia: Secondary | ICD-10-CM

## 2021-09-21 LAB — COMPREHENSIVE METABOLIC PANEL
ALT: 21 U/L (ref 0–35)
AST: 13 U/L (ref 0–37)
Albumin: 4.3 g/dL (ref 3.5–5.2)
Alkaline Phosphatase: 86 U/L (ref 39–117)
BUN: 12 mg/dL (ref 6–23)
CO2: 23 mEq/L (ref 19–32)
Calcium: 9.5 mg/dL (ref 8.4–10.5)
Chloride: 101 mEq/L (ref 96–112)
Creatinine, Ser: 0.36 mg/dL — ABNORMAL LOW (ref 0.40–1.20)
GFR: 113.54 mL/min (ref >60.00)
Glucose, Bld: 290 mg/dL — ABNORMAL HIGH (ref 70–99)
Potassium: 4.2 mEq/L (ref 3.5–5.1)
Sodium: 134 mEq/L — ABNORMAL LOW (ref 135–145)
Total Bilirubin: 0.6 mg/dL (ref 0.2–1.2)
Total Protein: 6.5 g/dL (ref 6.0–8.3)

## 2021-09-21 LAB — LIPID PANEL
Cholesterol: 323 mg/dL — ABNORMAL HIGH (ref 0–200)
HDL: 43.2 mg/dL (ref >39.00)
Total CHOL/HDL Ratio: 7
Triglycerides: 994 mg/dL — ABNORMAL HIGH (ref 0.0–149.0)

## 2021-09-21 LAB — HEMOGLOBIN A1C: Hgb A1c MFr Bld: 10.6 % — ABNORMAL HIGH (ref 4.6–6.5)

## 2021-09-21 LAB — LDL CHOLESTEROL, DIRECT: Direct LDL: 105 mg/dL

## 2021-09-21 MED ORDER — LOSARTAN POTASSIUM 50 MG PO TABS: 50.0000 mg | ORAL_TABLET | Freq: Every day | ORAL | 0 refills | Status: DC

## 2021-09-21 MED ORDER — TRULICITY 3 MG/0.5ML ~~LOC~~ SOAJ: 3.0000 mg | SUBCUTANEOUS | 11 refills | Status: DC

## 2021-09-21 MED ORDER — PANTOPRAZOLE SODIUM 40 MG PO TBEC: 40.0000 mg | DELAYED_RELEASE_TABLET | Freq: Every day | ORAL | 1 refills | Status: DC

## 2021-09-21 NOTE — Addendum Note (Signed)
Addended by: Gwenevere Abbot on: 09/21/2021 09:44 PM ? ? Modules accepted: Orders ? ?

## 2021-09-21 NOTE — Patient Instructions (Addendum)
Hypertension-blood pressure has been elevated when he checked at home and systolic in the 150 range today.  Will increase your losartan to 50 mg daily.  Would like to see your blood pressure closer to 130/80 within about 1 to 2 weeks.  If you have send me a message in 10 days by MyChart regarding daily BP readings. ? ?Hyperlipidemia-continue Zetia and we will get metabolic panel with lipid panel today. ? ?Diabetes-continue Trulicity and will get A1c today.  Make changes to treatment regimen if needed. ? ?Insomnia-asking for you to sign controlled med contract again today and will get a UDS.  Plan to continue Ambien 10 mg daily. ? ?Fibromyalgia with diffuse myofascial pain. Will get uds and have you sign contract. Then rx tramadol 50 mg to use one daily prn pain. ? ?Genella Rife- refilled protonix. ? ?Depression- stable. Continue prozac. ? ?Follow up in 3-4 month or sooner if needed. ?

## 2021-09-21 NOTE — Progress Notes (Addendum)
Subjective:    Patient ID: Megan Murray, female    DOB: 07/31/1965, 56 y.o.   MRN: [id number]  HPI  Htn- bp is high today. Pt states her bp at home have been creeping up. At home most of time close to 150 systolic.    Diabetes- on trulicity.   Genella RifeGerd- needs refill of protonix.  High cholesterol- she could not take repatha. Caused fatigue and bodyaches. She is on zetia. Cardiologist following pt.  The 10-year ASCVD risk score (Arnett DK, et al., 2019) is: 11.2%   Values used to calculate the score:     Age: [age] years     Sex: Female     Is Non-Hispanic African American: No     Diabetic: Yes     Tobacco smoker: No     Systolic Blood Pressure: 150 mmHg     Is BP treated: Yes     HDL Cholesterol: 42 mg/dL     Total Cholesterol: 232 mg/dL   Insomnia- uses ambien nightly.   Fibromyalgia- past repoorts adequate pain control with one tramadol daily.  Depression- on prozac daily.    Review of Systems  Constitutional:  Negative for chills, fatigue and fever.  Respiratory:  Negative for cough, chest tightness, shortness of breath and wheezing.   Cardiovascular:  Negative for chest pain and palpitations.  Gastrointestinal:  Negative for abdominal pain, constipation, diarrhea and nausea.  Musculoskeletal:  Negative for back pain, joint swelling and myalgias.  Neurological:  Negative for dizziness, light-headedness, numbness and headaches.  Hematological:  Negative for adenopathy.  Psychiatric/Behavioral:  Negative for behavioral problems, confusion and decreased concentration.    Past Medical History:  Diagnosis Date   Asthma    Diabetes mellitus without complication (HCC)    Dyslipidemia, goal to be determined    High triglycerides and LDL   Fatty liver 05/16/2018   By CT scan   Fibromyalgia    Hyperlipidemia    Hypertension    Major depression, recurrent, chronic (HCC) 04/25/2018   Dr. Evelene CroonKaur   Obesity due to excess calories without serious comorbidity    Osteoarthritis       Social History   Socioeconomic History   Marital status: Married    Spouse name: Not on file   Number of children: 1   Years of education: Not on file   Highest education level: Not on file  Occupational History   Occupation: disabled due to fibromyalgia and depression  Tobacco Use   Smoking status: Never   Smokeless tobacco: Never  Vaping Use   Vaping Use: Never used  Substance and Sexual Activity   Alcohol use: No   Drug use: No   Sexual activity: Yes  Other Topics Concern   Not on file  Social History Narrative   Not on file   Social Determinants of Health   Financial Resource Strain: Low Risk    Difficulty of Paying Living Expenses: Not hard at all  Food Insecurity: No Food Insecurity   Worried About Programme researcher, broadcasting/film/videounning Out of Food in the Last Year: Never true   Ran Out of Food in the Last Year: Never true  Transportation Needs: No Transportation Needs   Lack of Transportation (Medical): No   Lack of Transportation (Non-Medical): No  Physical Activity: Not on file  Stress: No Stress Concern Present   Feeling of Stress : Not at all  Social Connections: Moderately Integrated   Frequency of Communication with Friends and Family: More than three times a week  Frequency of Social Gatherings with Friends and Family: Twice a week   Attends Religious Services: More than 4 times per year   Active Member of [employer] or Organizations: No   Attends Engineer, structural: Never   Marital Status: Married  Catering manager Violence: Not At Risk   Fear of Current or Ex-Partner: No   Emotionally Abused: No   Physically Abused: No   Sexually Abused: No    Past Surgical History:  Procedure Laterality Date   ABDOMINAL HYSTERECTOMY     REDUCTION MAMMAPLASTY      Family History  Problem Relation Age of Onset   COPD Mother        Died at 71   Heart failure Mother        Not sure of details   Esophageal cancer Father        Survived cancer treatment and surgery   Drug  abuse Father    Cirrhosis Father        NAFLD   Healthy Sister    Diabetes Brother    Depression Brother    Heart attack Brother    Heart disease Brother    Diabetes Maternal Grandmother    Arthritis Maternal Grandmother    Cancer Paternal Grandmother    Hearing loss Paternal Grandmother    Liver disease Paternal Grandfather    Diabetes Maternal Grandfather     Allergies  Allergen Reactions   Codeine Palpitations    Natural or Synthetic Codeine   Atorvastatin Nausea Only and Other (See Comments)    Myalgias   Cholestyramine Diarrhea and Nausea Only   Crestor [Rosuvastatin Calcium] Nausea Only and Other (See Comments)    Myalgias   Fenofibrate Diarrhea and Nausea And Vomiting   Simvastatin Nausea Only and Other (See Comments)    Myalgias   Soy Allergy Diarrhea and Nausea And Vomiting   Statins Nausea Only and Other (See Comments)     Myalgias   Repatha [Evolocumab]    Meloxicam Rash    Current Outpatient Medications on File Prior to Visit  Medication Sig Dispense Refill   albuterol (VENTOLIN HFA) 108 (90 Base) MCG/ACT inhaler Inhale 2 puffs into the lungs every 6 (six) hours as needed for wheezing or shortness of breath. 6.7 g 2   aspirin EC 81 MG tablet Take 1 tablet (81 mg total) by mouth daily. Swallow whole. 90 tablet 3   diclofenac (VOLTAREN) 75 MG EC tablet Take 1 tablet (75 mg total) by mouth 2 (two) times daily. 60 tablet 1   diclofenac sodium (VOLTAREN) 1 % GEL Apply 2 g topically 4 (four) times daily.     Dulaglutide (TRULICITY) 1.5 MG/0.5ML SOPN 1.5 mg(0.5 ml) injection weekly 2 mL 11   ezetimibe (ZETIA) 10 MG tablet TAKE 1 TABLET(10 MG) BY MOUTH DAILY 90 tablet 2   FLUoxetine (PROZAC) 40 MG capsule TAKE 1 CAPSULE(40 MG) BY MOUTH DAILY 90 capsule 0   lidocaine (LIDODERM) 5 % Place 2 patches onto the skin daily. Suggest neck/shoulders- Remove & Discard patch within 12 hours or as directed by MD 60 patch 5   losartan (COZAAR) 25 MG tablet TAKE 1 TABLET(25 MG) BY  MOUTH DAILY 30 tablet 3   pantoprazole (PROTONIX) 40 MG tablet TAKE 1 TABLET(40 MG) BY MOUTH DAILY (Patient taking differently: Take 40 mg by mouth daily.) 90 tablet 1   traMADol (ULTRAM) 50 MG tablet TAKE 1 TABLET(50 MG) BY MOUTH EVERY 8 HOURS FOR UP TO 5 DAYS AS NEEDED 30  tablet 0   zolpidem (AMBIEN) 10 MG tablet TAKE 1 TABLET BY MOUTH AT BEDTIME AS NEEDED FOR INSOMNIA 30 tablet 1   No current facility-administered medications on file prior to visit.    BP (!) 150/78    Pulse 80    Temp 98 F (36.7 C)    Resp 18    Ht 5\' 1"  (1.549 m)    Wt 185 lb (83.9 kg)    SpO2 96%    BMI 34.96 kg/m        Objective:   Physical Exam  General Mental Status- Alert. General Appearance- Not in acute distress.   Skin General: Color- Normal Color. Moisture- Normal Moisture.  Neck Carotid Arteries- Normal color. Moisture- Normal Moisture. No carotid bruits. No JVD.  Chest and Lung Exam Auscultation: Breath Sounds:-Normal.  Cardiovascular Auscultation:Rythm- Regular. Murmurs & Other Heart Sounds:Auscultation of the heart reveals- No Murmurs.  Abdomen Inspection:-Inspeection Normal. Palpation/Percussion:Note:No mass. Palpation and Percussion of the abdomen reveal- Non Tender, Non Distended + BS, no rebound or guarding.  Neurologic Cranial Nerve exam:- CN III-XII intact(No nystagmus), symmetric smile. Strength:- 5/5 equal and symmetric strength both upper and lower extremities.       Assessment & Plan:   Patient Instructions  Hypertension-blood pressure has been elevated when he checked at home and systolic in the 150 range today.  Will increase your losartan to 50 mg daily.  Would like to see your blood pressure closer to 130/80 within about 1 to 2 weeks.  If you have send me a message in 10 days by MyChart regarding daily BP readings.  Hyperlipidemia-continue Zetia and we will get metabolic panel with lipid panel today.  Diabetes-continue Trulicity and will get A1c today.  Make changes  to treatment regimen if needed.  Insomnia-asking for you to sign controlled med contract again today and will get a UDS.  Plan to continue Ambien 10 mg daily.  Fibromyalgia with diffuse myofascial pain. Will get uds and have you sign contract. Then rx tramadol 50 mg to use one daily prn pain.  Gerd- refilled protonix.  Depression- stable. Continue prozac.  Follow up in 3-4 month or sooner if needed.   , PA-C

## 2021-09-22 ENCOUNTER — Other Ambulatory Visit: Payer: Self-pay | Admitting: Medical

## 2021-09-22 ENCOUNTER — Telehealth: Payer: Self-pay | Admitting: Medical

## 2021-09-22 ENCOUNTER — Encounter: Payer: Self-pay | Admitting: Pharmacist Clinician (PhC)/ Clinical Pharmacy Specialist

## 2021-09-22 DIAGNOSIS — E119 Type 2 diabetes mellitus without complications: Secondary | ICD-10-CM

## 2021-09-22 DIAGNOSIS — F339 Major depressive disorder, recurrent, unspecified: Secondary | ICD-10-CM

## 2021-09-22 LAB — DRUG MONITORING, PANEL 8 WITH CONFIRMATION, URINE
6 Acetylmorphine: NEGATIVE ng/mL (ref <10)
Alcohol Metabolites: NEGATIVE ng/mL (ref <500)
Amphetamines: NEGATIVE ng/mL (ref <500)
Benzodiazepines: NEGATIVE ng/mL (ref <100)
Buprenorphine, Urine: NEGATIVE ng/mL (ref <5)
Cocaine Metabolite: NEGATIVE ng/mL (ref <150)
Creatinine: 31.8 mg/dL (ref >=20.0)
MDMA: NEGATIVE ng/mL (ref <500)
Marijuana Metabolite: NEGATIVE ng/mL (ref <20)
Opiates: NEGATIVE ng/mL (ref <100)
Oxidant: NEGATIVE ug/mL (ref <200)
Oxycodone: NEGATIVE ng/mL (ref <100)
pH: 5.3 (ref 4.5–9.0)

## 2021-09-22 LAB — DM TEMPLATE

## 2021-09-22 NOTE — Telephone Encounter (Signed)
I was advised on March 13 -after the patient left from her visit that day -that she left visibly upset. Patient states she arrived at 11 o'clock for her 1120 appointment as requested. She was placed in a room and not updated with delays. Did not leave the office until 1245. Patient states that "my provider entered the room did not apologize for the wait "and she felt very rushed for her visit. ?States provider looked at her medication list and questioned if he was the one that filled her Ambien for her. She thought that it was odd a provider would ask if they were responsible for medication's they have been prescribing. 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 ?States she also asked for her test strips to be called into [facility] and that was not done yesterday. Patient would like her one touch ultra II test strips called into [facility]. ? ?

## 2021-09-22 NOTE — Telephone Encounter (Signed)
Patient would like her one touch ultra II test strips called into [facility]. ? ?Please send today.  ?

## 2021-09-22 NOTE — Telephone Encounter (Signed)
Opened to attempt to find strips in epic. Did not find one touch ultra II test strips called into [facility]. Will you send. ?

## 2021-09-23 MED ORDER — ONETOUCH VERIO VI STRP: ORAL_STRIP | 12 refills | Status: DC

## 2021-09-23 NOTE — Addendum Note (Signed)
Addended by: Maximino Sarin on: 09/23/2021 08:25 AM ? ? Modules accepted: Orders ? ?

## 2021-09-23 NOTE — Telephone Encounter (Signed)
Test strips sent to [facility] 

## 2021-09-23 NOTE — Telephone Encounter (Signed)
Pls refer to other phone note ?

## 2021-09-27 ENCOUNTER — Encounter (HOSPITAL_BASED_OUTPATIENT_CLINIC_OR_DEPARTMENT_OTHER): Payer: Self-pay | Admitting: Emergency Medicine

## 2021-09-27 ENCOUNTER — Other Ambulatory Visit: Payer: Self-pay

## 2021-09-27 ENCOUNTER — Emergency Department (HOSPITAL_BASED_OUTPATIENT_CLINIC_OR_DEPARTMENT_OTHER): Payer: Medicare Other

## 2021-09-27 ENCOUNTER — Inpatient Hospital Stay (HOSPITAL_BASED_OUTPATIENT_CLINIC_OR_DEPARTMENT_OTHER)
Admission: EM | Admit: 2021-09-27 | Discharge: 2021-09-30 | DRG: 639 | Discharge status: Against Medical Advice | Payer: Medicare Other | Attending: Internal Medicine | Admitting: Internal Medicine

## 2021-09-27 DIAGNOSIS — Z885 Allergy status to narcotic agent status: Secondary | ICD-10-CM | POA: Diagnosis not present

## 2021-09-27 DIAGNOSIS — K760 Fatty (change of) liver, not elsewhere classified: Secondary | ICD-10-CM | POA: Diagnosis present

## 2021-09-27 DIAGNOSIS — E1110 Type 2 diabetes mellitus with ketoacidosis without coma: Secondary | ICD-10-CM | POA: Diagnosis not present

## 2021-09-27 DIAGNOSIS — E781 Pure hyperglyceridemia: Secondary | ICD-10-CM

## 2021-09-27 DIAGNOSIS — Z888 Allergy status to other drugs, medicaments and biological substances status: Secondary | ICD-10-CM

## 2021-09-27 DIAGNOSIS — Z8249 Family history of ischemic heart disease and other diseases of the circulatory system: Secondary | ICD-10-CM

## 2021-09-27 DIAGNOSIS — I10 Essential (primary) hypertension: Secondary | ICD-10-CM | POA: Diagnosis present

## 2021-09-27 DIAGNOSIS — Z886 Allergy status to analgesic agent status: Secondary | ICD-10-CM | POA: Diagnosis not present

## 2021-09-27 DIAGNOSIS — E876 Hypokalemia: Secondary | ICD-10-CM | POA: Diagnosis present

## 2021-09-27 DIAGNOSIS — Z825 Family history of asthma and other chronic lower respiratory diseases: Secondary | ICD-10-CM

## 2021-09-27 DIAGNOSIS — E119 Type 2 diabetes mellitus without complications: Secondary | ICD-10-CM

## 2021-09-27 DIAGNOSIS — Z7985 Long-term (current) use of injectable non-insulin antidiabetic drugs: Secondary | ICD-10-CM

## 2021-09-27 DIAGNOSIS — Z6834 Body mass index (BMI) 34.0-34.9, adult: Secondary | ICD-10-CM | POA: Diagnosis not present

## 2021-09-27 DIAGNOSIS — Z7982 Long term (current) use of aspirin: Secondary | ICD-10-CM | POA: Diagnosis not present

## 2021-09-27 DIAGNOSIS — Z20822 Contact with and (suspected) exposure to covid-19: Secondary | ICD-10-CM | POA: Diagnosis present

## 2021-09-27 DIAGNOSIS — E0910 Drug or chemical induced diabetes mellitus with ketoacidosis without coma: Secondary | ICD-10-CM | POA: Diagnosis not present

## 2021-09-27 DIAGNOSIS — Z7984 Long term (current) use of oral hypoglycemic drugs: Secondary | ICD-10-CM | POA: Diagnosis not present

## 2021-09-27 DIAGNOSIS — R9431 Abnormal electrocardiogram [ECG] [EKG]: Secondary | ICD-10-CM

## 2021-09-27 DIAGNOSIS — J45991 Cough variant asthma: Secondary | ICD-10-CM | POA: Diagnosis present

## 2021-09-27 DIAGNOSIS — E669 Obesity, unspecified: Secondary | ICD-10-CM | POA: Diagnosis present

## 2021-09-27 DIAGNOSIS — E785 Hyperlipidemia, unspecified: Secondary | ICD-10-CM | POA: Diagnosis present

## 2021-09-27 DIAGNOSIS — M797 Fibromyalgia: Secondary | ICD-10-CM | POA: Diagnosis present

## 2021-09-27 DIAGNOSIS — Z79899 Other long term (current) drug therapy: Secondary | ICD-10-CM | POA: Diagnosis not present

## 2021-09-27 DIAGNOSIS — Z794 Long term (current) use of insulin: Secondary | ICD-10-CM

## 2021-09-27 DIAGNOSIS — Z833 Family history of diabetes mellitus: Secondary | ICD-10-CM

## 2021-09-27 DIAGNOSIS — I152 Hypertension secondary to endocrine disorders: Secondary | ICD-10-CM | POA: Diagnosis present

## 2021-09-27 HISTORY — DX: Type 2 diabetes mellitus with ketoacidosis without coma: E11.10

## 2021-09-27 HISTORY — DX: Abnormal electrocardiogram (ECG) (EKG): R94.31

## 2021-09-27 LAB — URINALYSIS, ROUTINE W REFLEX MICROSCOPIC
Bilirubin Urine: NEGATIVE
Hgb urine dipstick: NEGATIVE
Leukocytes,Ua: NEGATIVE
Nitrite: NEGATIVE
Specific Gravity, Urine: 1.025 (ref 1.005–1.030)
pH: 5.5 (ref 5.0–8.0)

## 2021-09-27 LAB — I-STAT ARTERIAL BLOOD GAS, ED
Acid-base deficit: 24 mmol/L — ABNORMAL HIGH (ref 0.0–2.0)
Bicarbonate: 5.1 mmol/L — ABNORMAL LOW (ref 20.0–28.0)
Calcium, Ion: 1.27 mmol/L (ref 1.15–1.40)
HCT: 57 % — ABNORMAL HIGH (ref 36.0–46.0)
Hemoglobin: 19.4 g/dL — ABNORMAL HIGH (ref 12.0–15.0)
O2 Saturation: 49 %
Patient temperature: 98.6
Potassium: 4.1 mmol/L (ref 3.5–5.1)
Sodium: 137 mmol/L (ref 135–145)
TCO2: 6 mmol/L — ABNORMAL LOW (ref 22–32)
pCO2 arterial: 19.4 mmHg — CL (ref 32–48)
pH, Arterial: 7.028 — CL (ref 7.35–7.45)
pO2, Arterial: 38 mmHg — CL (ref 83–108)

## 2021-09-27 LAB — BASIC METABOLIC PANEL
Anion gap: 22 — ABNORMAL HIGH (ref 5–15)
BUN: 19 mg/dL (ref 6–20)
CO2: <7 mmol/L — ABNORMAL LOW (ref 22–32)
Calcium: 9.2 mg/dL (ref 8.9–10.3)
Chloride: 110 mmol/L (ref 98–111)
Creatinine, Ser: 0.81 mg/dL (ref 0.44–1.00)
GFR, Estimated: >60 mL/min (ref >60)
Glucose, Bld: 207 mg/dL — ABNORMAL HIGH (ref 70–99)
Potassium: 3.8 mmol/L (ref 3.5–5.1)
Sodium: 137 mmol/L (ref 135–145)

## 2021-09-27 LAB — LIPASE, BLOOD: Lipase: 285 U/L — ABNORMAL HIGH (ref 11–51)

## 2021-09-27 LAB — COMPREHENSIVE METABOLIC PANEL
ALT: 19 U/L (ref 0–44)
ALT: 16 U/L (ref 0–44)
AST: 30 U/L (ref 15–41)
AST: 11 U/L — ABNORMAL LOW (ref 15–41)
Albumin: 5 g/dL (ref 3.5–5.0)
Albumin: 4.3 g/dL (ref 3.5–5.0)
Alkaline Phosphatase: 107 U/L (ref 38–126)
Alkaline Phosphatase: 98 U/L (ref 38–126)
Anion gap: 25 — ABNORMAL HIGH (ref 5–15)
Anion gap: 15 (ref 5–15)
BUN: 18 mg/dL (ref 6–20)
BUN: 16 mg/dL (ref 6–20)
CO2: 4 mmol/L — ABNORMAL LOW (ref 22–32)
CO2: 9 mmol/L — ABNORMAL LOW (ref 22–32)
Calcium: 9 mg/dL (ref 8.9–10.3)
Calcium: 8.9 mg/dL (ref 8.9–10.3)
Chloride: 106 mmol/L (ref 98–111)
Chloride: 108 mmol/L (ref 98–111)
Creatinine, Ser: 0.78 mg/dL (ref 0.44–1.00)
Creatinine, Ser: 0.69 mg/dL (ref 0.44–1.00)
GFR, Estimated: >60 mL/min (ref >60)
GFR, Estimated: >60 mL/min (ref >60)
Glucose, Bld: 302 mg/dL — ABNORMAL HIGH (ref 70–99)
Glucose, Bld: 175 mg/dL — ABNORMAL HIGH (ref 70–99)
Potassium: 5.8 mmol/L — ABNORMAL HIGH (ref 3.5–5.1)
Potassium: 3.8 mmol/L (ref 3.5–5.1)
Sodium: 135 mmol/L (ref 135–145)
Sodium: 132 mmol/L — ABNORMAL LOW (ref 135–145)
Total Bilirubin: 0.4 mg/dL (ref 0.3–1.2)
Total Bilirubin: 1.1 mg/dL (ref 0.3–1.2)
Total Protein: 8.4 g/dL — ABNORMAL HIGH (ref 6.5–8.1)
Total Protein: 7.8 g/dL (ref 6.5–8.1)

## 2021-09-27 LAB — I-STAT VENOUS BLOOD GAS, ED
Acid-base deficit: 26 mmol/L — ABNORMAL HIGH (ref 0.0–2.0)
Bicarbonate: 3.9 mmol/L — ABNORMAL LOW (ref 20.0–28.0)
Calcium, Ion: 1.06 mmol/L — ABNORMAL LOW (ref 1.15–1.40)
HCT: 41 % (ref 36.0–46.0)
Hemoglobin: 13.9 g/dL (ref 12.0–15.0)
O2 Saturation: 76 %
Patient temperature: 97.8
Potassium: 3.5 mmol/L (ref 3.5–5.1)
Sodium: 142 mmol/L (ref 135–145)
TCO2: <5 mmol/L — ABNORMAL LOW (ref 22–32)
pCO2, Ven: 15.4 mmHg — CL (ref 44–60)
pH, Ven: 7.01 — CL (ref 7.25–7.43)
pO2, Ven: 58 mmHg — ABNORMAL HIGH (ref 32–45)

## 2021-09-27 LAB — CBC
HCT: 51.9 % — ABNORMAL HIGH (ref 36.0–46.0)
Hemoglobin: 17.3 g/dL — ABNORMAL HIGH (ref 12.0–15.0)
MCH: 30.2 pg (ref 26.0–34.0)
MCHC: 33.3 g/dL (ref 30.0–36.0)
MCV: 90.7 fL (ref 80.0–100.0)
Platelets: 332 10*3/uL (ref 150–400)
RBC: 5.72 MIL/uL — ABNORMAL HIGH (ref 3.87–5.11)
RDW: 13.9 % (ref 11.5–15.5)
WBC: 17.3 10*3/uL — ABNORMAL HIGH (ref 4.0–10.5)
nRBC: 0 % (ref 0.0–0.2)

## 2021-09-27 LAB — LACTIC ACID, PLASMA: Lactic Acid, Venous: 1.8 mmol/L (ref 0.5–1.9)

## 2021-09-27 LAB — OSMOLALITY: Osmolality: 325 mOsm/kg (ref 275–295)

## 2021-09-27 LAB — BETA-HYDROXYBUTYRIC ACID
Beta-Hydroxybutyric Acid: 6.95 mmol/L — ABNORMAL HIGH (ref 0.05–0.27)
Beta-Hydroxybutyric Acid: 4.49 mmol/L — ABNORMAL HIGH (ref 0.05–0.27)

## 2021-09-27 LAB — RESP PANEL BY RT-PCR (FLU A&B, COVID) ARPGX2
Influenza A by PCR: NEGATIVE
Influenza B by PCR: NEGATIVE
SARS Coronavirus 2 by RT PCR: NEGATIVE

## 2021-09-27 LAB — MAGNESIUM: Magnesium: 1.9 mg/dL (ref 1.7–2.4)

## 2021-09-27 LAB — TROPONIN I (HIGH SENSITIVITY): Troponin I (High Sensitivity): 6 ng/L (ref <18)

## 2021-09-27 LAB — GLUCOSE, CAPILLARY
Glucose-Capillary: 189 mg/dL — ABNORMAL HIGH (ref 70–99)
Glucose-Capillary: 161 mg/dL — ABNORMAL HIGH (ref 70–99)
Glucose-Capillary: 178 mg/dL — ABNORMAL HIGH (ref 70–99)

## 2021-09-27 LAB — PHOSPHORUS: Phosphorus: 1.8 mg/dL — ABNORMAL LOW (ref 2.5–4.6)

## 2021-09-27 LAB — CBG MONITORING, ED
Glucose-Capillary: 287 mg/dL — ABNORMAL HIGH (ref 70–99)
Glucose-Capillary: 332 mg/dL — ABNORMAL HIGH (ref 70–99)
Glucose-Capillary: 283 mg/dL — ABNORMAL HIGH (ref 70–99)
Glucose-Capillary: 251 mg/dL — ABNORMAL HIGH (ref 70–99)
Glucose-Capillary: 227 mg/dL — ABNORMAL HIGH (ref 70–99)
Glucose-Capillary: 189 mg/dL — ABNORMAL HIGH (ref 70–99)
Glucose-Capillary: 126 mg/dL — ABNORMAL HIGH (ref 70–99)

## 2021-09-27 LAB — TSH: TSH: 0.77 u[IU]/mL (ref 0.350–4.500)

## 2021-09-27 LAB — CK: Total CK: 18 U/L — ABNORMAL LOW (ref 38–234)

## 2021-09-27 MED ORDER — FLUOXETINE HCL 20 MG PO CAPS
40.0000 mg | ORAL_CAPSULE | Freq: Every day | ORAL | Status: DC
  Administered 2021-09-28 – 2021-09-29 (×2): 40 mg via ORAL
  Filled 2021-09-27 (×2): qty 2

## 2021-09-27 MED ORDER — ACETAMINOPHEN 650 MG RE SUPP: 650.0000 mg | Freq: Four times a day (QID) | RECTAL | Status: DC | PRN

## 2021-09-27 MED ORDER — LACTATED RINGERS IV SOLN: INTRAVENOUS | Status: DC

## 2021-09-27 MED ORDER — INSULIN REGULAR(HUMAN) IN NACL 100-0.9 UT/100ML-% IV SOLN
INTRAVENOUS | Status: DC
  Administered 2021-09-27: 16 [IU]/h via INTRAVENOUS
  Filled 2021-09-27: qty 100

## 2021-09-27 MED ORDER — ACETAMINOPHEN 325 MG PO TABS: 650.0000 mg | ORAL_TABLET | Freq: Four times a day (QID) | ORAL | Status: DC | PRN

## 2021-09-27 MED ORDER — DEXTROSE IN LACTATED RINGERS 5 % IV SOLN: INTRAVENOUS | Status: DC

## 2021-09-27 MED ORDER — EZETIMIBE 10 MG PO TABS
10.0000 mg | ORAL_TABLET | Freq: Every day | ORAL | Status: DC
Start: 2021-09-28 — End: 2021-09-30
  Administered 2021-09-28 – 2021-09-29 (×2): 10 mg via ORAL
  Filled 2021-09-27 (×4): qty 1

## 2021-09-27 MED ORDER — DEXTROSE 50 % IV SOLN: 0.0000 mL | INTRAVENOUS | Status: DC | PRN

## 2021-09-27 MED ORDER — MORPHINE SULFATE (PF) 2 MG/ML IV SOLN: 2.0000 mg | INTRAVENOUS | Status: DC | PRN

## 2021-09-27 MED ORDER — TRAMADOL HCL 50 MG PO TABS: 50.0000 mg | ORAL_TABLET | Freq: Every day | ORAL | Status: DC | PRN

## 2021-09-27 MED ORDER — MORPHINE SULFATE (PF) 4 MG/ML IV SOLN
4.0000 mg | Freq: Once | INTRAVENOUS | Status: AC
  Administered 2021-09-27: 4 mg via INTRAVENOUS
  Filled 2021-09-27: qty 1

## 2021-09-27 MED ORDER — INSULIN REGULAR(HUMAN) IN NACL 100-0.9 UT/100ML-% IV SOLN
INTRAVENOUS | Status: DC
  Administered 2021-09-28: 1.6 [IU]/h via INTRAVENOUS
  Filled 2021-09-27: qty 100

## 2021-09-27 MED ORDER — ONDANSETRON HCL 4 MG PO TABS: 4.0000 mg | ORAL_TABLET | Freq: Four times a day (QID) | ORAL | Status: DC | PRN

## 2021-09-27 MED ORDER — ACETAMINOPHEN 325 MG PO TABS
650.0000 mg | ORAL_TABLET | Freq: Once | ORAL | Status: DC
  Filled 2021-09-27: qty 2

## 2021-09-27 MED ORDER — ALBUTEROL SULFATE (2.5 MG/3ML) 0.083% IN NEBU: 2.5000 mg | INHALATION_SOLUTION | Freq: Four times a day (QID) | RESPIRATORY_TRACT | Status: DC | PRN

## 2021-09-27 MED ORDER — ONDANSETRON HCL 4 MG/2ML IJ SOLN: 4.0000 mg | Freq: Four times a day (QID) | INTRAMUSCULAR | Status: DC | PRN

## 2021-09-27 MED ORDER — ONDANSETRON HCL 4 MG/2ML IJ SOLN
INTRAMUSCULAR | Status: AC
  Administered 2021-09-27: 4 mg
  Filled 2021-09-27: qty 2

## 2021-09-27 MED ORDER — SODIUM CHLORIDE 0.9 % IV BOLUS
1000.0000 mL | Freq: Once | INTRAVENOUS | Status: AC
  Administered 2021-09-27: 1000 mL via INTRAVENOUS

## 2021-09-27 MED ORDER — HYDROCODONE-ACETAMINOPHEN 5-325 MG PO TABS: 1.0000 | ORAL_TABLET | ORAL | Status: DC | PRN

## 2021-09-27 MED ORDER — ASPIRIN EC 81 MG PO TBEC
81.0000 mg | DELAYED_RELEASE_TABLET | Freq: Every day | ORAL | Status: DC
  Administered 2021-09-28 – 2021-09-29 (×2): 81 mg via ORAL
  Filled 2021-09-27 (×2): qty 1

## 2021-09-27 MED ORDER — METOCLOPRAMIDE HCL 5 MG/ML IJ SOLN
5.0000 mg | Freq: Four times a day (QID) | INTRAMUSCULAR | Status: DC | PRN
Start: 2021-09-27 — End: 2021-09-30

## 2021-09-27 MED ORDER — CHLORHEXIDINE GLUCONATE CLOTH 2 % EX PADS
6.0000 | MEDICATED_PAD | Freq: Every day | CUTANEOUS | Status: DC
  Administered 2021-09-28: 6 via TOPICAL

## 2021-09-27 MED ORDER — IOHEXOL 300 MG/ML  SOLN
100.0000 mL | Freq: Once | INTRAMUSCULAR | Status: AC | PRN
  Administered 2021-09-27: 100 mL via INTRAVENOUS

## 2021-09-27 MED ORDER — PANTOPRAZOLE SODIUM 40 MG PO TBEC
40.0000 mg | DELAYED_RELEASE_TABLET | Freq: Every day | ORAL | Status: DC
  Administered 2021-09-28 – 2021-09-29 (×2): 40 mg via ORAL
  Filled 2021-09-27 (×2): qty 1

## 2021-09-27 NOTE — ED Notes (Signed)
Difficulty obtaining blood and iv, PA inserted ultrasound guide iv and labs sent.  ?

## 2021-09-27 NOTE — Assessment & Plan Note (Signed)
Hold home meds for tonight ?

## 2021-09-27 NOTE — Progress Notes (Signed)
RT Note: Venous blood obtained by RN for timed/repeat ISTAT Venous Blood Gas, test repeated on second ISTAT device for correlation of critical results. ?

## 2021-09-27 NOTE — Assessment & Plan Note (Signed)
Chronic stable albuterol prn ?

## 2021-09-27 NOTE — Assessment & Plan Note (Signed)
will admit per DKA  protocol, obtain serial BMET, start on glucosestabalizer, aggressive IVF.  ? Change IVF to D5 1/2Na after BG <250 .  ?So far work up of possible causes of DKA trulicity ?  ?Monitor in [place]. Replace potassium as needed.   ? Consult diabetes coordinator ? ? ? ?

## 2021-09-27 NOTE — ED Notes (Signed)
RT Note: Venous blood obtained by RN, resulted with ISTAT by RT,  Pt. given cup of ice/blanket per RN request. ?

## 2021-09-27 NOTE — H&P (Signed)
? ? ?Megan Murray [id number] DOB: 24-May-1966 DOA: 09/27/2021 ? ? ?  ?PCP: Mackie Pai, PA-C   ?  ? ?Patient arrived to ER on 09/27/21 at 1126 ?Referred by Attending Toy Baker, MD ? ? ?Patient coming from:   ? home Lives   With family ?From facility   ? ?Chief Complaint:   ?Chief Complaint  ?Patient presents with  ? Emesis  ? ? ?HPI: ?Megan Murray is a 56 y.o. female with medical history significant of  DM2, htn HLD ?  ? ?Presented with  nausea and vomiting ?Pt presents with nausea and vomiting ?Recently PCP increased her lisinopril ? ?3 days ago started with N/V unable to keep PO down ?Lightheaded and having palpitations ? ?She is on Trulicity ?Patient reports that about 3 weeks ago she developed GI virus infection and had severe nausea vomiting she was able to tolerate a lot of sherbet which she has taking.  When she went back to her regular doctor her blood sugar was noted to be elevated ?And her Trulicity as well as her blood pressure medications were increased ?Sooner after she started to feel unwell and developed worsening nausea and vomiting again ?Patient never had to be on insulin prior to this.  Usually her hemoglobin A1c stays around 7 ?  ? ? Initial COVID TEST  ?NEGATIVE  ? ?Lab Results  ?Component Value Date  ? [place] NEGATIVE 09/27/2021  ? 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 ?  ?Regarding pertinent Chronic problems:   ? ? Hyperlipidemia -  on Zetia ?Lipid Panel  ?   ?Component Value Date/Time  ? CHOL 323 (H) 09/21/2021 1232  ? CHOL 232 (H) 05/01/2021 0843  ? TRIG (H) 09/21/2021 1232  ?  994.0 Triglyceride is over 400; calculations on Lipids are invalid.  ? HDL 43.20 09/21/2021 1232  ? HDL 42 05/01/2021 0843  ? CHOLHDL 7 09/21/2021 1232  ? VLDL 70.6 (H) 02/02/2021 1108  ? [place] 130 (H) 05/01/2021 0843  ? LDLDIRECT 105.0 09/21/2021 1232  ? LABVLDL 60 (H) 05/01/2021 0843  ? ? ? HTN on Cozaar ? DM 2 -  ?Lab Results  ?Component Value Date  ? HGBA1C 10.6 (H) 09/21/2021  ? on  Trulicity and Synjardy xr ? ? obesity-   ?BMI Readings from Last 1 Encounters:  ?09/27/21 34.96 kg/m?  ?    ?While in ER: ?  ? ?Noted to be in euvolemic DKA ?Started on insulin and D5 lactic Ringer's ?Significant acidosis ?Ordered ? ?  ? ?CXR -  NON acute ? ?CTabd/pelvis -hepatic steatosis but otherwise nonacute ?  ? ?Following Medications were ordered in ER: ?Medications  ?insulin regular, human (MYXREDLIN) 100 units/ 100 mL infusion ( Intravenous Infusion Verify 09/27/21 2100)  ?lactated ringers infusion (0 mLs Intravenous Hold 09/27/21 1929)  ?dextrose 5 % in lactated ringers infusion ( Intravenous New Bag/Given 09/27/21 1756)  ?dextrose 50 % solution 0-50 mL (has no administration in time range)  ?acetaminophen (TYLENOL) tablet 650 mg (650 mg Oral Not Given 09/27/21 1400)  ?aspirin EC tablet 81 mg (has no administration in time range)  ?albuterol (PROVENTIL) (2.5 MG/3ML) 0.083% nebulizer solution 2.5 mg (has no administration in time range)  ?ezetimibe (ZETIA) tablet 10 mg (has no administration in time range)  ?FLUoxetine (PROZAC) capsule 40 mg (has no administration in time range)  ?pantoprazole (PROTONIX) EC tablet 40 mg (has no administration in time range)  ?traMADol (ULTRAM) tablet 50 mg (has no administration in time range)  ?acetaminophen (TYLENOL)  tablet 650 mg (has no administration in time range)  ?  Or  ?acetaminophen (TYLENOL) suppository 650 mg (has no administration in time range)  ?HYDROcodone-acetaminophen (NORCO/VICODIN) 5-325 MG per tablet 1-2 tablet (has no administration in time range)  ?ondansetron (ZOFRAN) tablet 4 mg (has no administration in time range)  ?  Or  ?ondansetron (ZOFRAN) injection 4 mg (has no administration in time range)  ?Chlorhexidine Gluconate Cloth 2 % PADS 6 each (has no administration in time range)  ?ondansetron (ZOFRAN) 4 MG/2ML injection (4 mg  Given 09/27/21 1242)  ?sodium chloride 0.9 % bolus 1,000 mL (0 mLs Intravenous Stopped 09/27/21 1537)  ?morphine (PF) 4 MG/ML  injection 4 mg (4 mg Intravenous Given 09/27/21 1406)  ?iohexol (OMNIPAQUE) 300 MG/ML solution 100 mL (100 mLs Intravenous Contrast Given 09/27/21 1604)  ?  ?______  ?  ?ED Triage Vitals  ?Enc Vitals Group  ?   BP 09/27/21 1136 (!) 192/89  ?   Pulse Rate 09/27/21 1136 (!) 122  ?   Resp 09/27/21 1136 20  ?   Temp 09/27/21 1136 (!) 97.4 ?F (36.3 ?C)  ?   Temp Source 09/27/21 1136 Oral  ?   SpO2 09/27/21 1136 100 %  ?   Weight 09/27/21 1141 185 lb (83.9 kg)  ?   Height 09/27/21 1141 5\' 1"  (1.549 m)  ?   Head Circumference --   ?   Peak Flow --   ?   Pain Score 09/27/21 1141 8  ?   Pain Loc --   ?   Pain Edu? --   ?   Excl. in [place]? --   ?[id number]    ? _________________________________________ ?Significant initial  Findings: ?Abnormal Labs Reviewed  ?LIPASE, BLOOD - Abnormal; Notable for the following components:  ?    Result Value  ? Lipase 285 (*)   ? All other components within normal limits  ?COMPREHENSIVE METABOLIC PANEL - Abnormal; Notable for the following components:  ? Potassium 5.8 (*)   ? CO2 4 (*)   ? Glucose, Bld 302 (*)   ? Total Protein 8.4 (*)   ? Anion gap 25 (*)   ? All other components within normal limits  ?CBC - Abnormal; Notable for the following components:  ? WBC 17.3 (*)   ? RBC 5.72 (*)   ? Hemoglobin 17.3 (*)   ? HCT 51.9 (*)   ? All other components within normal limits  ?URINALYSIS, ROUTINE W REFLEX MICROSCOPIC - Abnormal; Notable for the following components:  ? Glucose, UA 4+ (*)   ? Ketones, ur 4+ (*)   ? Protein, ur 1+ (*)   ? Bacteria, UA RARE (*)   ? All other components within normal limits  ?BETA-HYDROXYBUTYRIC ACID - Abnormal; Notable for the following components:  ? Beta-Hydroxybutyric Acid 6.95 (*)   ? All other components within normal limits  ?OSMOLALITY - Abnormal; Notable for the following components:  ? Osmolality 325 (*)   ? All other components within normal limits  ?BASIC METABOLIC PANEL - Abnormal; Notable for the following components:  ? CO2 <7 (*)   ? Glucose, Bld 207 (*)    ? Anion gap 22 (*)   ? All other components within normal limits  ?GLUCOSE, CAPILLARY - Abnormal; Notable for the following components:  ? Glucose-Capillary 189 (*)   ? All other components within normal limits  ?CBG MONITORING, ED - Abnormal; Notable for the following components:  ? Glucose-Capillary 332 (*)   ?  All other components within normal limits  ?I-STAT ARTERIAL BLOOD GAS, ED - Abnormal; Notable for the following components:  ? pH, Arterial 7.028 (*)   ? pCO2 arterial 19.4 (*)   ? pO2, Arterial 38 (*)   ? Bicarbonate 5.1 (*)   ? TCO2 6 (*)   ? Acid-base deficit 24.0 (*)   ? HCT 57.0 (*)   ? Hemoglobin 19.4 (*)   ? All other components within normal limits  ?CBG MONITORING, ED - Abnormal; Notable for the following components:  ? Glucose-Capillary 287 (*)   ? All other components within normal limits  ?I-STAT VENOUS BLOOD GAS, ED - Abnormal; Notable for the following components:  ? pH, Ven 7.010 (*)   ? pCO2, Ven 15.4 (*)   ? pO2, Ven 58 (*)   ? Bicarbonate 3.9 (*)   ? TCO2 <5 (*)   ? Acid-base deficit 26.0 (*)   ? Calcium, Ion 1.06 (*)   ? All other components within normal limits  ?CBG MONITORING, ED - Abnormal; Notable for the following components:  ? Glucose-Capillary 283 (*)   ? All other components within normal limits  ?CBG MONITORING, ED - Abnormal; Notable for the following components:  ? Glucose-Capillary 251 (*)   ? All other components within normal limits  ?CBG MONITORING, ED - Abnormal; Notable for the following components:  ? Glucose-Capillary 227 (*)   ? All other components within normal limits  ?CBG MONITORING, ED - Abnormal; Notable for the following components:  ? Glucose-Capillary 189 (*)   ? All other components within normal limits  ?CBG MONITORING, ED - Abnormal; Notable for the following components:  ? Glucose-Capillary 126 (*)   ? All other components within normal limits  ? ?  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 ?  ?The recent clinical data is shown below. ?

## 2021-09-27 NOTE — Plan of Care (Signed)
  Problem: Education: Goal: Knowledge of General Education information will improve Description Including pain rating scale, medication(s)/side effects and non-pharmacologic comfort measures Outcome: Progressing   

## 2021-09-27 NOTE — ED Notes (Signed)
Pt discharged from [facility]. Spouse remains at bedside ?

## 2021-09-27 NOTE — Assessment & Plan Note (Signed)
Diabetic coordinator consult ?Hold home medications for now ?Until recovers. ?Hold Trulicity given euglycemic DKA.  Patient may need different medications to be discussed with her primary care provider ?

## 2021-09-27 NOTE — Assessment & Plan Note (Signed)
Continue zetia.  

## 2021-09-27 NOTE — Plan of Care (Signed)
Called for direct admit:  ? ?56 y/o female presented with vomiting and found to DKA- has a h/o DM2. Has been on Trulicity for about a week now. Given IVF and being started on insulin infusion. ? ?Debbe Odea, MD ?

## 2021-09-27 NOTE — ED Triage Notes (Signed)
Patient states recently seeing her PCP and increased her lisinopril dosage. Started Thursday having nausea and vomiting. States she has been unable to keep any of her meds down. C/o dizziness and heart palpations.   ?

## 2021-09-27 NOTE — ED Notes (Signed)
Patient transported to CT 

## 2021-09-27 NOTE — Subjective & Objective (Signed)
Pt presents with nausea and vomiting ?Recently PCP increased her lisinopril ? ?3 days ago started with N/V unable to keep PO down ?Lightheaded and having palpitations ? ?She is on Trulicity ? ?

## 2021-09-27 NOTE — Assessment & Plan Note (Addendum)
Hold trulicity ?Once DKA resolves start SSI ?Will need different approach given euglycemic DKA ?

## 2021-09-27 NOTE — ED Notes (Signed)
Report called to [facility] [place], RN ?

## 2021-09-27 NOTE — ED Notes (Signed)
CRITICAL VALUE STICKER ? ?CRITICAL VALUE:Serum osmolality 325 ? ?RECEIVER (on-site recipient of call):Carmon Ginsberg, RN ? ?DATE & TIME NOTIFIED: 09-27-2021 0603 ? ?MESSENGER (representative from lab): ? ?MD NOTIFIED: P.A. Greta Doom ? ?TIME OF NOTIFICATION:1614 ? ?RESPONSE:  ? ?

## 2021-09-27 NOTE — Assessment & Plan Note (Signed)
-   will monitor on tele avoid QT prolonging medications, rehydrate correct electrolytes ? ?

## 2021-09-27 NOTE — ED Provider Notes (Signed)
?[facility] EMERGENCY DEPT ?Provider Note ? ? ?CSN: [id number] ?Arrival date & time: 09/27/21  1126 ? ?  ? ?History ? ?Chief Complaint  ?Patient presents with  ? Emesis  ? ? ?Megan Murray is a 56 y.o. female. ? ?The history is provided by the patient. No language interpreter was used.  ?Emesis ? ?56 year old female significant history of hypertension, hyperlipidemia, fibromyalgia, diabetes, obesity, presenting today with complaint of nausea vomiting.  Patient report for the past week she has been feeling bad.  She endorsed feeling nauseous, vomiting, generalized weakness, lightheadedness, and feels that her heart is racing.  She also endorsed fatigue.  Shortness of breath when she exerts herself.  Does not seems to be able to keep anything down.  She recall her symptoms started when she went to see her PCP at the beginning of week for regular checkup.  At that time she was noted to have elevated blood pressure and her lisinopril was increased.  She also mentioned that she was on Ozempic however due to lack of access, her doctor recently switched over to Trulicity.  She has not been checking her blood sugars at regular.  She did report that her A1c did increase from 7 to 10 at her last visit.  No urinary symptoms ? ?Home Medications ?Prior to Admission medications   ?Medication Sig Start Date End Date Taking? Authorizing Provider  ?albuterol (VENTOLIN HFA) 108 (90 Base) MCG/ACT inhaler Inhale 2 puffs into the lungs every 6 (six) hours as needed for wheezing or shortness of breath. 06/28/19   Tomma Lightninglalere, Adewale A, MD  ?aspirin EC 81 MG tablet Take 1 tablet (81 mg total) by mouth daily. Swallow whole. 02/20/21   Georgeanna LeaKrasowski, Robert J, MD  ?diclofenac (VOLTAREN) 75 MG EC tablet Take 1 tablet (75 mg total) by mouth 2 (two) times daily. 09/25/20   Eulis FosterWebb, Padonda B, FNP  ?diclofenac sodium (VOLTAREN) 1 % GEL Apply 2 g topically 4 (four) times daily. 10/17/18   [provider]  ?Dulaglutide (TRULICITY) 3  MG/0.5ML SOPN Inject 3 mg as directed once a week. 09/21/21   Saguier, Ramon DredgeEdward, PA-C  ?ezetimibe (ZETIA) 10 MG tablet TAKE 1 TABLET(10 MG) BY MOUTH DAILY 05/25/21   Saguier, Ramon DredgeEdward, PA-C  ?FLUoxetine (PROZAC) 40 MG capsule TAKE 1 CAPSULE(40 MG) BY MOUTH DAILY 08/28/21   Saguier, Ramon DredgeEdward, PA-C  ?glucose blood [facility](ONETOUCH VERIO) test strip Test blood sugar glucose TID dx:E11.9 09/23/21   Saguier, Ramon DredgeEdward, PA-C  ?lidocaine (LIDODERM) 5 % Place 2 patches onto the skin daily. Suggest neck/shoulders- Remove & Discard patch within 12 hours or as directed by MD 03/05/20   Waldon MerlMartin, William C, PA-C  ?losartan (COZAAR) 50 MG tablet Take 1 tablet (50 mg total) by mouth daily. 09/21/21   Saguier, Ramon DredgeEdward, PA-C  ?pantoprazole (PROTONIX) 40 MG tablet Take 1 tablet (40 mg total) by mouth daily. 09/21/21   Saguier, Ramon DredgeEdward, PA-C  ?traMADol (ULTRAM) 50 MG tablet TAKE 1 TABLET(50 MG) BY MOUTH EVERY 8 HOURS FOR UP TO 5 DAYS AS NEEDED 08/15/21   Saguier, Ramon DredgeEdward, PA-C  ?zolpidem (AMBIEN) 10 MG tablet TAKE 1 TABLET BY MOUTH AT BEDTIME AS NEEDED FOR INSOMNIA 08/24/21   Saguier, Ramon DredgeEdward, PA-C  ?   ? ?Allergies    ?Codeine, Atorvastatin, Cholestyramine, Crestor [rosuvastatin calcium], Fenofibrate, Simvastatin, Soy allergy, Statins, Repatha [evolocumab], and Meloxicam   ? ?Review of Systems   ?Review of Systems  ?Gastrointestinal:  Positive for vomiting.  ?All other systems reviewed and are negative. ? ?Physical Exam ?Updated  Vital Signs ?BP (!) 192/89 (BP Location: Right Arm)   Pulse (!) 122   Temp (!) 97.4 ?F (36.3 ?C) (Oral) Comment: eating ice chips  Resp 20   Ht 5\' 1"  (1.549 m)   Wt 83.9 kg   SpO2 100%   BMI 34.96 kg/m?  ?Physical Exam ?Vitals and nursing note reviewed.  ?Constitutional:   ?   General: She is not in acute distress. ?   Appearance: She is well-developed. She is obese.  ?HENT:  ?   Head: Atraumatic.  ?Eyes:  ?   Conjunctiva/sclera: Conjunctivae normal.  ?Cardiovascular:  ?   Rate and Rhythm: Tachycardia present.  ?Pulmonary:  ?    Effort: Pulmonary effort is normal.  ?   Breath sounds: No wheezing or rhonchi.  ?Abdominal:  ?   Palpations: Abdomen is soft.  ?   Tenderness: There is no abdominal tenderness.  ?Musculoskeletal:  ?   Cervical back: Neck supple.  ?   Right lower leg: No edema.  ?   Left lower leg: No edema.  ?Skin: ?   Findings: No rash.  ?Neurological:  ?   Mental Status: She is alert. Mental status is at baseline.  ?Psychiatric:     ?   Mood and Affect: Mood normal.  ? ? ?ED Results / Procedures / Treatments   ?Labs ?(all labs ordered are listed, but only abnormal results are displayed) ?Labs Reviewed  ?LIPASE, BLOOD - Abnormal; Notable for the following components:  ?    Result Value  ? Lipase 285 (*)   ? All other components within normal limits  ?COMPREHENSIVE METABOLIC PANEL - Abnormal; Notable for the following components:  ? Potassium 5.8 (*)   ? CO2 4 (*)   ? Glucose, Bld 302 (*)   ? Total Protein 8.4 (*)   ? Anion gap 25 (*)   ? All other components within normal limits  ?CBC - Abnormal; Notable for the following components:  ? WBC 17.3 (*)   ? RBC 5.72 (*)   ? Hemoglobin 17.3 (*)   ? HCT 51.9 (*)   ? All other components within normal limits  ?URINALYSIS, ROUTINE W REFLEX MICROSCOPIC - Abnormal; Notable for the following components:  ? Glucose, UA 4+ (*)   ? Ketones, ur 4+ (*)   ? Protein, ur 1+ (*)   ? Bacteria, UA RARE (*)   ? All other components within normal limits  ?BETA-HYDROXYBUTYRIC ACID - Abnormal; Notable for the following components:  ? Beta-Hydroxybutyric Acid 6.95 (*)   ? All other components within normal limits  ?OSMOLALITY - Abnormal; Notable for the following components:  ? Osmolality 325 (*)   ? All other components within normal limits  ?CBG MONITORING, ED - Abnormal; Notable for the following components:  ? Glucose-Capillary 332 (*)   ? All other components within normal limits  ?I-STAT ARTERIAL BLOOD GAS, ED - Abnormal; Notable for the following components:  ? pH, Arterial 7.028 (*)   ? pCO2  arterial 19.4 (*)   ? pO2, Arterial 38 (*)   ? Bicarbonate 5.1 (*)   ? TCO2 6 (*)   ? Acid-base deficit 24.0 (*)   ? HCT 57.0 (*)   ? Hemoglobin 19.4 (*)   ? All other components within normal limits  ?CBG MONITORING, ED - Abnormal; Notable for the following components:  ? Glucose-Capillary 287 (*)   ? All other components within normal limits  ?I-STAT VENOUS BLOOD GAS, ED - Abnormal; Notable for the  following components:  ? pH, Ven 7.010 (*)   ? pCO2, Ven 15.4 (*)   ? pO2, Ven 58 (*)   ? Bicarbonate 3.9 (*)   ? TCO2 <5 (*)   ? Acid-base deficit 26.0 (*)   ? Calcium, Ion 1.06 (*)   ? All other components within normal limits  ?CBG MONITORING, ED - Abnormal; Notable for the following components:  ? Glucose-Capillary 283 (*)   ? All other components within normal limits  ?RESP PANEL BY RT-PCR (FLU A&B, COVID) [id number]  ?BLOOD GAS, VENOUS  ? ? ?EKG ?EKG Interpretation ? ?Date/Time:  Sunday September 27 2021 12:04:11 EDT ?Ventricular Rate:  115 ?PR Interval:  152 ?QRS Duration: 105 ?QT Interval:  372 ?QTC Calculation: 515 ?R Axis:   114 ?Text Interpretation: Sinus tachycardia Right atrial enlargement Interpretation limited secondary to artifact No old tracing to compare Confirmed by Tanda Rockers ([id number]) on 09/27/2021 1:01:43 PM ? ?Radiology ?CT ABDOMEN PELVIS W CONTRAST ? ?Result Date: 09/27/2021 ?CLINICAL DATA:  Abdominal pain. Elevated lipase. Nausea and vomiting for a week. EXAM: CT ABDOMEN AND PELVIS WITH CONTRAST TECHNIQUE: Multidetector CT imaging of the abdomen and pelvis was performed using the standard protocol following bolus administration of intravenous contrast. RADIATION DOSE REDUCTION: This exam was performed according to the departmental dose-optimization program which includes automated exposure control, adjustment of the mA and/or kV according to patient size and/or use of iterative reconstruction technique. CONTRAST:  OMNIPAQUE IOHEXOL 300 MG/ML  SOLN COMPARISON:  September 06, 2012 FINDINGS: Lower chest:  No acute abnormality. Hepatobiliary: Hepatic steatosis. Previous cholecystectomy. The liver and portal vein are otherwise normal. Pancreas: The pancreas is normal in appearance. No pancreatic mass. No peripancreatic strand

## 2021-09-28 DIAGNOSIS — I10 Essential (primary) hypertension: Secondary | ICD-10-CM | POA: Diagnosis not present

## 2021-09-28 DIAGNOSIS — E1110 Type 2 diabetes mellitus with ketoacidosis without coma: Secondary | ICD-10-CM | POA: Diagnosis not present

## 2021-09-28 DIAGNOSIS — E119 Type 2 diabetes mellitus without complications: Secondary | ICD-10-CM | POA: Diagnosis not present

## 2021-09-28 DIAGNOSIS — E781 Pure hyperglyceridemia: Secondary | ICD-10-CM | POA: Diagnosis not present

## 2021-09-28 LAB — GLUCOSE, CAPILLARY
Glucose-Capillary: 100 mg/dL — ABNORMAL HIGH (ref 70–99)
Glucose-Capillary: 113 mg/dL — ABNORMAL HIGH (ref 70–99)
Glucose-Capillary: 119 mg/dL — ABNORMAL HIGH (ref 70–99)
Glucose-Capillary: 122 mg/dL — ABNORMAL HIGH (ref 70–99)
Glucose-Capillary: 132 mg/dL — ABNORMAL HIGH (ref 70–99)
Glucose-Capillary: 135 mg/dL — ABNORMAL HIGH (ref 70–99)
Glucose-Capillary: 137 mg/dL — ABNORMAL HIGH (ref 70–99)
Glucose-Capillary: 141 mg/dL — ABNORMAL HIGH (ref 70–99)
Glucose-Capillary: 148 mg/dL — ABNORMAL HIGH (ref 70–99)
Glucose-Capillary: 149 mg/dL — ABNORMAL HIGH (ref 70–99)
Glucose-Capillary: 151 mg/dL — ABNORMAL HIGH (ref 70–99)
Glucose-Capillary: 156 mg/dL — ABNORMAL HIGH (ref 70–99)
Glucose-Capillary: 157 mg/dL — ABNORMAL HIGH (ref 70–99)
Glucose-Capillary: 161 mg/dL — ABNORMAL HIGH (ref 70–99)
Glucose-Capillary: 170 mg/dL — ABNORMAL HIGH (ref 70–99)
Glucose-Capillary: 172 mg/dL — ABNORMAL HIGH (ref 70–99)
Glucose-Capillary: 175 mg/dL — ABNORMAL HIGH (ref 70–99)
Glucose-Capillary: 189 mg/dL — ABNORMAL HIGH (ref 70–99)
Glucose-Capillary: 195 mg/dL — ABNORMAL HIGH (ref 70–99)
Glucose-Capillary: 200 mg/dL — ABNORMAL HIGH (ref 70–99)
Glucose-Capillary: 200 mg/dL — ABNORMAL HIGH (ref 70–99)
Glucose-Capillary: 207 mg/dL — ABNORMAL HIGH (ref 70–99)
Glucose-Capillary: 216 mg/dL — ABNORMAL HIGH (ref 70–99)
Glucose-Capillary: 251 mg/dL — ABNORMAL HIGH (ref 70–99)
Glucose-Capillary: 259 mg/dL — ABNORMAL HIGH (ref 70–99)

## 2021-09-28 LAB — BASIC METABOLIC PANEL
Anion gap: 8 (ref 5–15)
Anion gap: 7 (ref 5–15)
Anion gap: 8 (ref 5–15)
Anion gap: 7 (ref 5–15)
BUN: 13 mg/dL (ref 6–20)
BUN: 9 mg/dL (ref 6–20)
BUN: 8 mg/dL (ref 6–20)
BUN: 7 mg/dL (ref 6–20)
CO2: 14 mmol/L — ABNORMAL LOW (ref 22–32)
CO2: 16 mmol/L — ABNORMAL LOW (ref 22–32)
CO2: 18 mmol/L — ABNORMAL LOW (ref 22–32)
CO2: 20 mmol/L — ABNORMAL LOW (ref 22–32)
Calcium: 8.9 mg/dL (ref 8.9–10.3)
Calcium: 9.1 mg/dL (ref 8.9–10.3)
Calcium: 9 mg/dL (ref 8.9–10.3)
Calcium: 9.1 mg/dL (ref 8.9–10.3)
Chloride: 109 mmol/L (ref 98–111)
Chloride: 111 mmol/L (ref 98–111)
Chloride: 109 mmol/L (ref 98–111)
Chloride: 110 mmol/L (ref 98–111)
Creatinine, Ser: 0.57 mg/dL (ref 0.44–1.00)
Creatinine, Ser: 0.43 mg/dL — ABNORMAL LOW (ref 0.44–1.00)
Creatinine, Ser: 0.38 mg/dL — ABNORMAL LOW (ref 0.44–1.00)
Creatinine, Ser: 0.31 mg/dL — ABNORMAL LOW (ref 0.44–1.00)
GFR, Estimated: >60 mL/min (ref >60)
GFR, Estimated: >60 mL/min (ref >60)
GFR, Estimated: >60 mL/min (ref >60)
GFR, Estimated: >60 mL/min (ref >60)
Glucose, Bld: 182 mg/dL — ABNORMAL HIGH (ref 70–99)
Glucose, Bld: 154 mg/dL — ABNORMAL HIGH (ref 70–99)
Glucose, Bld: 170 mg/dL — ABNORMAL HIGH (ref 70–99)
Glucose, Bld: 101 mg/dL — ABNORMAL HIGH (ref 70–99)
Potassium: 3.5 mmol/L (ref 3.5–5.1)
Potassium: 2.8 mmol/L — ABNORMAL LOW (ref 3.5–5.1)
Potassium: 2.9 mmol/L — ABNORMAL LOW (ref 3.5–5.1)
Potassium: 3.2 mmol/L — ABNORMAL LOW (ref 3.5–5.1)
Sodium: 131 mmol/L — ABNORMAL LOW (ref 135–145)
Sodium: 134 mmol/L — ABNORMAL LOW (ref 135–145)
Sodium: 135 mmol/L (ref 135–145)
Sodium: 137 mmol/L (ref 135–145)

## 2021-09-28 LAB — CBC WITH DIFFERENTIAL/PLATELET
Abs Immature Granulocytes: 0.11 10*3/uL — ABNORMAL HIGH (ref 0.00–0.07)
Basophils Absolute: 0 10*3/uL (ref 0.0–0.1)
Basophils Relative: 0 %
Eosinophils Absolute: 0 10*3/uL (ref 0.0–0.5)
Eosinophils Relative: 0 %
HCT: 43.4 % (ref 36.0–46.0)
Hemoglobin: 15.2 g/dL — ABNORMAL HIGH (ref 12.0–15.0)
Immature Granulocytes: 1 %
Lymphocytes Relative: 21 %
Lymphs Abs: 2.2 10*3/uL (ref 0.7–4.0)
MCH: 30.7 pg (ref 26.0–34.0)
MCHC: 35 g/dL (ref 30.0–36.0)
MCV: 87.7 fL (ref 80.0–100.0)
Monocytes Absolute: 0.9 10*3/uL (ref 0.1–1.0)
Monocytes Relative: 9 %
Neutro Abs: 7.3 10*3/uL (ref 1.7–7.7)
Neutrophils Relative %: 69 %
Platelets: 229 10*3/uL (ref 150–400)
RBC: 4.95 MIL/uL (ref 3.87–5.11)
RDW: 13.8 % (ref 11.5–15.5)
WBC: 10.5 10*3/uL (ref 4.0–10.5)
nRBC: 0 % (ref 0.0–0.2)

## 2021-09-28 LAB — COMPREHENSIVE METABOLIC PANEL
ALT: 14 U/L (ref 0–44)
AST: 9 U/L — ABNORMAL LOW (ref 15–41)
Albumin: 3.7 g/dL (ref 3.5–5.0)
Alkaline Phosphatase: 84 U/L (ref 38–126)
Anion gap: 10 (ref 5–15)
BUN: 11 mg/dL (ref 6–20)
CO2: 14 mmol/L — ABNORMAL LOW (ref 22–32)
Calcium: 9 mg/dL (ref 8.9–10.3)
Chloride: 111 mmol/L (ref 98–111)
Creatinine, Ser: 0.45 mg/dL (ref 0.44–1.00)
GFR, Estimated: >60 mL/min (ref >60)
Glucose, Bld: 143 mg/dL — ABNORMAL HIGH (ref 70–99)
Potassium: 3 mmol/L — ABNORMAL LOW (ref 3.5–5.1)
Sodium: 135 mmol/L (ref 135–145)
Total Bilirubin: 0.6 mg/dL (ref 0.3–1.2)
Total Protein: 6.6 g/dL (ref 6.5–8.1)

## 2021-09-28 LAB — HIV ANTIBODY (ROUTINE TESTING W REFLEX): HIV Screen 4th Generation wRfx: NONREACTIVE

## 2021-09-28 LAB — BETA-HYDROXYBUTYRIC ACID
Beta-Hydroxybutyric Acid: 1.71 mmol/L — ABNORMAL HIGH (ref 0.05–0.27)
Beta-Hydroxybutyric Acid: 1.98 mmol/L — ABNORMAL HIGH (ref 0.05–0.27)
Beta-Hydroxybutyric Acid: 2.85 mmol/L — ABNORMAL HIGH (ref 0.05–0.27)

## 2021-09-28 LAB — LACTIC ACID, PLASMA: Lactic Acid, Venous: 1.6 mmol/L (ref 0.5–1.9)

## 2021-09-28 LAB — PHOSPHORUS: Phosphorus: 1.5 mg/dL — ABNORMAL LOW (ref 2.5–4.6)

## 2021-09-28 LAB — MAGNESIUM: Magnesium: 1.9 mg/dL (ref 1.7–2.4)

## 2021-09-28 MED ORDER — POTASSIUM CHLORIDE CRYS ER 20 MEQ PO TBCR
30.0000 meq | EXTENDED_RELEASE_TABLET | Freq: Three times a day (TID) | ORAL | Status: DC
  Administered 2021-09-28 (×2): 30 meq via ORAL
  Filled 2021-09-28 (×2): qty 1

## 2021-09-28 MED ORDER — INSULIN STARTER KIT- PEN NEEDLES (ENGLISH)
1.0000 | Freq: Once | Status: AC
  Administered 2021-09-28: 1
  Filled 2021-09-28: qty 1

## 2021-09-28 MED ORDER — DEXTROSE 50 % IV SOLN: 0.0000 mL | INTRAVENOUS | Status: DC | PRN

## 2021-09-28 MED ORDER — POTASSIUM CHLORIDE 10 MEQ/100ML IV SOLN: 10.0000 meq | INTRAVENOUS | Status: DC

## 2021-09-28 MED ORDER — INSULIN REGULAR(HUMAN) IN NACL 100-0.9 UT/100ML-% IV SOLN
INTRAVENOUS | Status: DC
  Administered 2021-09-29: 2.2 [IU]/h via INTRAVENOUS
  Filled 2021-09-28: qty 100

## 2021-09-28 MED ORDER — POTASSIUM CHLORIDE CRYS ER 20 MEQ PO TBCR
30.0000 meq | EXTENDED_RELEASE_TABLET | Freq: Once | ORAL | Status: AC
  Administered 2021-09-28: 30 meq via ORAL
  Filled 2021-09-28: qty 1

## 2021-09-28 NOTE — Progress Notes (Addendum)
[facility] ? ?PROGRESS NOTE ? ?Megan Murray CCO:[id number] DOB: 30-Aug-1965 DOA: 09/27/2021 ?PCP: Esperanza Richters, PA-C ? ? ?Brief HPI:   ?56 year old female with a history of diabetes mellitus type 2, hypertension, hyperlipidemia presented with nausea and vomiting.  Recently PCP increased her lisinopril.  She is currently on Trulicity.  Recently patient's dose of Trulicity was increased.  Soon after that she started feeling unwell developed nausea and vomiting.  Hemoglobin A1c is days around 7. ? ? ? ?Subjective  ? ?Patient seen and examined, feels are overall better.  Denies nausea and vomiting at this time.  No abdominal pain.  Anion gap has resolved.  Still had low bicarb on BMP so IV insulin was continued. ? ? Assessment/Plan:  ? ?Diabetic ketoacidosis ?-Resolved ?-Anion gap has resolved, still had low CO2 ?-IV insulin was continued, BMP shows bicarb of 18 ?-She still has positive beta hydroxybutyrate, 1.98 ?-Once DKA is resolved then we will  discontinue insulin and start Semglee 10 units subcu daily ?-NovoLog 0 to 15 units every 4 hours ?-We will start carb modified diet ? ?Hypertension ?-Blood pressure is stable ?-Antihypertensive medications on hold ? ?Family and hypertriglyceridemia ?-Continue Zetia ? ?Asthma ?-Stable, continue albuterol as needed ? ?Diabetes mellitus type 2 ?-We will hold Trulicity ?-We will discontinue Trulicity at discharge due to euglycemic DKA ? ? ? ? ? ?Medications ? ?  ? acetaminophen  650 mg Oral Once  ? aspirin EC  81 mg Oral Daily  ? Chlorhexidine Gluconate Cloth  6 each Topical Daily  ? ezetimibe  10 mg Oral Daily  ? FLUoxetine  40 mg Oral Daily  ? insulin starter kit- pen needles  1 kit Other Once  ? pantoprazole  40 mg Oral Daily  ? potassium chloride  30 mEq Oral TID  ? ? ? Data Reviewed:  ? ?CBG: ? ?Recent Labs  ?Lab 09/28/21 ?1006 09/28/21 ?1101 09/28/21 ?1205 09/28/21 ?1310 09/28/21 ?1414  ?GLUCAP 161* 135* 156* 172* 216*  ? ? ?SpO2: 99 %  ? ? ?Vitals:  ? 09/28/21  1200 09/28/21 1209 09/28/21 1300 09/28/21 1400  ?BP: (!) 138/55  (!) 115/98 (!) 120/46  ?Pulse: 83  80 81  ?Resp: 17  (!) 22 20  ?Temp:  98.3 ?F (36.8 ?C)    ?TempSrc:  Axillary    ?SpO2: 97%  96% 99%  ?Weight:      ?Height:      ? ? ? ? ?Data Reviewed: ? ?Basic Metabolic Panel: ?Recent Labs  ?Lab 09/27/21 ?2150 09/28/21 ?0127 09/28/21 ?7023 09/28/21 ?0912 09/28/21 ?1301  ?NA 132* 131* 135 134* 135  ?K 3.8 3.5 3.0* 2.8* 2.9*  ?CL 108 109 111 111 109  ?CO2 9* 14* 14* 16* 18*  ?GLUCOSE 175* 182* 143* 154* 170*  ?BUN 16 13 11 9 8   ?CREATININE 0.69 0.57 0.45 0.43* 0.38*  ?CALCIUM 8.9 8.9 9.0 9.1 9.0  ?MG 1.9  --  1.9  --   --   ?PHOS 1.8*  --  1.5*  --   --   ? ? ?CBC: ?Recent Labs  ?Lab 09/27/21 ?1158 09/27/21 ?1236 09/27/21 ?1505 09/28/21 ?09/30/21  ?WBC 17.3*  --   --  10.5  ?NEUTROABS  --   --   --  7.3  ?HGB 17.3* 19.4* 13.9 15.2*  ?HCT 51.9* 57.0* 41.0 43.4  ?MCV 90.7  --   --  87.7  ?PLT 332  --   --  229  ? ? ?LFT ?Recent Labs  ?  Lab 09/27/21 ?1158 09/27/21 ?2150 09/28/21 ?6722  ?AST 30 11* 9*  ?ALT $Rem'19 16 14  'XHhx$ ?ALKPHOS 107 98 84  ?BILITOT 0.4 1.1 0.6  ?PROT 8.4* 7.8 6.6  ?ALBUMIN 5.0 4.3 3.7  ? ?  ?Antibiotics: ?Anti-infectives (From admission, onward)  ? ? None  ? ?  ? ? ? ?DVT prophylaxis: SCDs ? ?Code Status: Full code ? ?Family Communication: No family at bedside ? ? ?CONSULTS  ? ? ?Objective  ? ? ?Physical Examination: ? ? ?General-appears in no acute distress ?Heart-S1-S2, regular, no murmur auscultated ?Lungs-clear to auscultation bilaterally, no wheezing or crackles auscultated ?Abdomen-soft, nontender, no organomegaly ?Extremities-no edema in the lower extremities ?Neuro-alert, oriented x3, no focal deficit noted ? ?Status is: Inpatient: Diabetic ketoacidosis ? ? ? ?  ? ? ?Oswald Hillock ?  ?[facility] ?If 7PM-7AM, please contact night-coverage at [email or web address], ?Office  [phone] ? ? ?09/28/2021, 2:46 PM  LOS: 1 day  ? ? ? ? ? ? ? ? ? ? ?  ?

## 2021-09-28 NOTE — Progress Notes (Signed)
Inpatient Diabetes Program Recommendations ? ?AACE/ADA: New Consensus Statement on Inpatient Glycemic Control (2015) ? ?Target Ranges:  Prepandial:   less than 140 mg/dL ?     Peak postprandial:   less than 180 mg/dL (1-2 hours) ?     Critically ill patients:  140 - 180 mg/dL  ? ?Lab Results  ?Component Value Date  ? GLUCAP 200 (H) 09/28/2021  ? HGBA1C 10.6 (H) 09/21/2021  ? ? ?Review of Glycemic Control ? ?Diabetes history: T2DM ?Outpatient Diabetes medications: Trulicity 3 mg weekly, Synjardy 25/1000 mg QD ?Current orders for Inpatient glycemic control: IV insulin ? ?HgbA1C - 10.6% ? ?Inpatient Diabetes Program Recommendations:   ? ?When ready to transition off drip, give Semglee 1-2H prior to discontinuation of insulin drip.  ?Semglee 10 units QD ?Novolog 0-15 units Q4 x 12H, then TID with meals and 0-5 HS ?When taking po's, add Novolog 3 units TID with meals if eating > 50%. ? ?Spoke with pt at bedside regarding her diabetes control at home. Pt states her PCP recently increased her Trulicity to 3 mg weekly and Synjardy to 25/1000 mg QD. ?Pt states she was sick with the norovirus about a month ago, and she wasn't drinking fluids like she usually does. Also was only eating sherbet, as everything else made her sick. States her HgbA1C is normally around 7.2% and has never known it to be up to 10.6%. Usually eats healthy diet and leaves off beverages with sugar. We discussed leaving off Trulicity and Synjardy when she is discharged and going home on basal-bolus insulin. Will order insulin pen starter kit. Pt wants info on Souris and will bring her informaton packet. Will teach insulin pen administration and will discuss Libre prescription with MD. Pt states she will f/u with PCP within a week or two of discharge.  ? ?Continue to follow.  ? ?Thank you. ?Lorenda Peck, RD, LDN, CDE ?Inpatient Diabetes Coordinator ?[phone]  ? ? ? ? ? ? ?

## 2021-09-29 DIAGNOSIS — E119 Type 2 diabetes mellitus without complications: Secondary | ICD-10-CM | POA: Diagnosis not present

## 2021-09-29 DIAGNOSIS — I10 Essential (primary) hypertension: Secondary | ICD-10-CM | POA: Diagnosis not present

## 2021-09-29 DIAGNOSIS — E1110 Type 2 diabetes mellitus with ketoacidosis without coma: Secondary | ICD-10-CM | POA: Diagnosis not present

## 2021-09-29 LAB — GLUCOSE, CAPILLARY
Glucose-Capillary: 136 mg/dL — ABNORMAL HIGH (ref 70–99)
Glucose-Capillary: 148 mg/dL — ABNORMAL HIGH (ref 70–99)
Glucose-Capillary: 149 mg/dL — ABNORMAL HIGH (ref 70–99)
Glucose-Capillary: 154 mg/dL — ABNORMAL HIGH (ref 70–99)
Glucose-Capillary: 165 mg/dL — ABNORMAL HIGH (ref 70–99)
Glucose-Capillary: 189 mg/dL — ABNORMAL HIGH (ref 70–99)
Glucose-Capillary: 197 mg/dL — ABNORMAL HIGH (ref 70–99)
Glucose-Capillary: 209 mg/dL — ABNORMAL HIGH (ref 70–99)
Glucose-Capillary: 213 mg/dL — ABNORMAL HIGH (ref 70–99)
Glucose-Capillary: 234 mg/dL — ABNORMAL HIGH (ref 70–99)
Glucose-Capillary: 236 mg/dL — ABNORMAL HIGH (ref 70–99)

## 2021-09-29 LAB — BASIC METABOLIC PANEL
Anion gap: 7 (ref 5–15)
BUN: <5 mg/dL — ABNORMAL LOW (ref 6–20)
CO2: 23 mmol/L (ref 22–32)
Calcium: 8.6 mg/dL — ABNORMAL LOW (ref 8.9–10.3)
Chloride: 106 mmol/L (ref 98–111)
Creatinine, Ser: 0.33 mg/dL — ABNORMAL LOW (ref 0.44–1.00)
GFR, Estimated: >60 mL/min (ref >60)
Glucose, Bld: 148 mg/dL — ABNORMAL HIGH (ref 70–99)
Potassium: 2.4 mmol/L — CL (ref 3.5–5.1)
Sodium: 136 mmol/L (ref 135–145)

## 2021-09-29 LAB — POTASSIUM
Potassium: 2.9 mmol/L — ABNORMAL LOW (ref 3.5–5.1)
Potassium: 4 mmol/L (ref 3.5–5.1)
Potassium: 3.6 mmol/L (ref 3.5–5.1)

## 2021-09-29 LAB — BETA-HYDROXYBUTYRIC ACID: Beta-Hydroxybutyric Acid: 0.23 mmol/L (ref 0.05–0.27)

## 2021-09-29 LAB — MAGNESIUM: Magnesium: 1.9 mg/dL (ref 1.7–2.4)

## 2021-09-29 MED ORDER — INSULIN GLARGINE-YFGN 100 UNIT/ML ~~LOC~~ SOLN
10.0000 [IU] | Freq: Every day | SUBCUTANEOUS | Status: DC
  Administered 2021-09-29: 10 [IU] via SUBCUTANEOUS
  Filled 2021-09-29 (×2): qty 0.1

## 2021-09-29 MED ORDER — INSULIN ASPART 100 UNIT/ML IJ SOLN
0.0000 [IU] | Freq: Every day | INTRAMUSCULAR | Status: DC
  Administered 2021-09-29: 2 [IU] via SUBCUTANEOUS

## 2021-09-29 MED ORDER — INSULIN ASPART 100 UNIT/ML IJ SOLN
0.0000 [IU] | Freq: Three times a day (TID) | INTRAMUSCULAR | Status: DC
  Administered 2021-09-29 (×2): 5 [IU] via SUBCUTANEOUS

## 2021-09-29 MED ORDER — POTASSIUM CHLORIDE 20 MEQ PO PACK
60.0000 meq | PACK | Freq: Once | ORAL | Status: AC
  Administered 2021-09-29: 60 meq via ORAL
  Filled 2021-09-29: qty 3

## 2021-09-29 MED ORDER — POTASSIUM CHLORIDE 10 MEQ/100ML IV SOLN
10.0000 meq | INTRAVENOUS | Status: AC
  Administered 2021-09-29 (×5): 10 meq via INTRAVENOUS
  Filled 2021-09-29 (×5): qty 100

## 2021-09-29 MED ORDER — INSULIN GLARGINE 100 UNIT/ML SOLOSTAR PEN
15.0000 [IU] | PEN_INJECTOR | Freq: Every day | SUBCUTANEOUS | 3 refills | Status: DC
Start: 2021-09-29 — End: 2021-10-28

## 2021-09-29 MED ORDER — LOSARTAN POTASSIUM 50 MG PO TABS
50.0000 mg | ORAL_TABLET | Freq: Every day | ORAL | Status: DC
  Administered 2021-09-29: 50 mg via ORAL
  Filled 2021-09-29: qty 1

## 2021-09-29 MED ORDER — HYDRALAZINE HCL 25 MG PO TABS: 25.0000 mg | ORAL_TABLET | Freq: Four times a day (QID) | ORAL | Status: DC | PRN

## 2021-09-29 MED ORDER — METFORMIN HCL 500 MG PO TABS: 500.0000 mg | ORAL_TABLET | Freq: Two times a day (BID) | ORAL | 11 refills | Status: DC

## 2021-09-29 NOTE — Progress Notes (Signed)
?  Transition of Care (TOC) Screening Note ? ? ?Patient Details  ?Name: Megan Murray ?Date of Birth: 07-06-1966 ? ? ?Transition of Care (TOC) CM/SW Contact:    ?Kaylaann Mountz, LCSW ?Phone Number: ?09/29/2021, 11:22 AM ? ? ? ?Transition of Care Department [facility]) has reviewed patient and no TOC needs have been identified at this time. We will continue to monitor patient advancement through interdisciplinary progression rounds. If new patient transition needs arise, please place a TOC consult. ? ? ?

## 2021-09-29 NOTE — Discharge Summary (Signed)
?Physician Discharge Summary ?  ?Patient: Megan Murray MRN: [id number] DOB: 1966/05/17  ?Admit date:     09/27/2021  ?Discharge date: 09/30/2021  ?Discharge Physician: Meredeth Ide  ? ?PCP: Esperanza Richters, PA-C  ? ?Recommendations at discharge:  ? ?Follow-up PCP in 1 week ? ?Discharge Diagnoses: ?Principal Problem: ?  DKA (diabetic ketoacidosis) (HCC) ?Active Problems: ?  Familial hypertriglyceridemia ?  Essential hypertension ?  Type 2 diabetes mellitus without complication, without Geers-term current use of insulin (HCC) ?  Cough variant asthma ?  Prolonged QT interval ? ? ? ?Hospital Course: ?56 year old female with a history of diabetes mellitus type 2, hypertension, hyperlipidemia presented with nausea and vomiting.  Recently PCP increased her lisinopril.  She is currently on Trulicity.  Recently patient's dose of Trulicity was increased.  Soon after that she started feeling unwell developed nausea and vomiting.  Hemoglobin A1c is days around  ? ?Assessment and Plan: ? ?Euglycemic diabetic ketoacidosis ?-Resolved; likely due to Jardiance and Trulicity ?-Patient was treated with IV insulin, anion gap has resolved ?-We will discontinue both Jardiance and Trulicity at this time ? ?Diabetes mellitus type 2 ?-As above we will discontinue both Jardiance and Trulicity ?-Jardiance is known to cause euglycemic DKA ?-I will start patient on Lantus 15 units subcu daily ?-Continue metformin 500 mg p.o. twice daily ?-Patient can follow-up with her PCP in 1 week for adjustment of insulin versus addition of other medications ?  ?Hypertension ?-Blood pressure is stable ?-Continue home regimen ?  ?Family and hypertriglyceridemia ?-Continue Zetia ?  ?Asthma ?-Stable, continue albuterol as needed ?  ?Hypokalemia ?-Potassium is 2.9 ?-We will replace potassium before discharge ? ?  ? ? ?Consultants:  ?Procedures performed:  ?Disposition: Home ?Diet recommendation:  ?Discharge Diet Orders (From admission, onward)  ? ?  Start      Ordered  ? 09/29/21 0000  Diet - low sodium heart healthy       ? 09/29/21 1900  ? ?  ?  ? ?  ? ?Carb modified diet ?DISCHARGE MEDICATION: ?Allergies as of 09/29/2021   ? ?   Reactions  ? Codeine Palpitations  ? Natural or Synthetic Codeine  ? Atorvastatin Nausea Only, Other (See Comments)  ? Myalgias  ? Cholestyramine Diarrhea, Nausea Only  ? Crestor [rosuvastatin Calcium] Nausea Only, Other (See Comments)  ? Myalgias  ? Fenofibrate Diarrhea, Nausea And Vomiting  ? Simvastatin Nausea Only, Other (See Comments)  ? Myalgias  ? Soy Allergy Diarrhea, Nausea And Vomiting  ? Statins Nausea Only, Other (See Comments)  ?  Myalgias  ? Repatha [evolocumab] Other (See Comments)  ? flu  ? Meloxicam Rash  ? ?  ? ?  ?Medication List  ?  ? ?STOP taking these medications   ? ?Synjardy XR 25-1000 MG Tb24 ?Generic drug: Empagliflozin-metFORMIN HCl ER ?  ?Trulicity 3 MG/0.5ML Sopn ?Generic drug: Dulaglutide ?  ? ?  ? ?TAKE these medications   ? ?albuterol 108 (90 Base) MCG/ACT inhaler ?Commonly known as: VENTOLIN HFA ?Inhale 2 puffs into the lungs every 6 (six) hours as needed for wheezing or shortness of breath. ?  ?aspirin EC 81 MG tablet ?Take 1 tablet (81 mg total) by mouth daily. Swallow whole. ?  ?diclofenac 75 MG EC tablet ?Commonly known as: VOLTAREN ?Take 1 tablet (75 mg total) by mouth 2 (two) times daily. ?What changed:  ?when to take this ?reasons to take this ?  ?diclofenac sodium 1 % Gel ?Commonly known as: VOLTAREN ?Apply 2 g  topically 4 (four) times daily. ?  ?ezetimibe 10 MG tablet ?Commonly known as: ZETIA ?TAKE 1 TABLET(10 MG) BY MOUTH DAILY ?What changed: See the new instructions. ?  ?FLUoxetine 40 MG capsule ?Commonly known as: PROZAC ?TAKE 1 CAPSULE(40 MG) BY MOUTH DAILY ?What changed: See the new instructions. ?  ?insulin glargine 100 UNIT/ML Solostar Pen ?Commonly known as: LANTUS ?Inject 15 Units into the skin at bedtime. ?  ?lidocaine 5 % ?Commonly known as: Lidoderm ?Place 2 patches onto the skin daily.  Suggest neck/shoulders- Remove & Discard patch within 12 hours or as directed by MD ?What changed:  ?when to take this ?reasons to take this ?  ?losartan 50 MG tablet ?Commonly known as: COZAAR ?Take 1 tablet (50 mg total) by mouth daily. ?  ?metFORMIN 500 MG tablet ?Commonly known as: Glucophage ?Take 1 tablet (500 mg total) by mouth 2 (two) times daily with a meal. ?  ?OneTouch Verio test strip ?Generic drug: glucose blood ?Test blood sugar glucose TID dx:E11.9 ?  ?pantoprazole 40 MG tablet ?Commonly known as: PROTONIX ?Take 1 tablet (40 mg total) by mouth daily. ?  ?traMADol 50 MG tablet ?Commonly known as: ULTRAM ?TAKE 1 TABLET(50 MG) BY MOUTH EVERY 8 HOURS FOR UP TO 5 DAYS AS NEEDED ?What changed: See the new instructions. ?  ?zolpidem 10 MG tablet ?Commonly known as: AMBIEN ?TAKE 1 TABLET BY MOUTH AT BEDTIME AS NEEDED FOR INSOMNIA ?What changed: See the new instructions. ?  ? ?  ? ? Follow-up Information   ? ? Saguier, Ramon DredgeEdward, PA-C. Schedule an appointment as soon as possible for a visit in 1 week(s).   ?Specialties: Internal Medicine, Family Medicine ?Contact information: ?[address] ?[address] ?[place] [id number] ?[phone] ? ? ?  ?  ? ?  ?  ? ?  ? ?Discharge Exam: ?Filed Weights  ? 09/27/21 1141 09/27/21 2300  ?Weight: 83.9 kg 78.1 kg  ? ?General-appears in no acute distress ?Heart-S1-S2, regular, no murmur auscultated ?Lungs-clear to auscultation bilaterally, no wheezing or crackles auscultated ?Abdomen-soft, nontender, no organomegaly ?Extremities-no edema in the lower extremities ?Neuro-alert, oriented x3, no focal deficit noted ? ?Condition at discharge: good ? ?The results of significant diagnostics from this hospitalization (including imaging, microbiology, ancillary and laboratory) are listed below for reference.  ? ?Imaging Studies: ?CT ABDOMEN PELVIS W CONTRAST ? ?Result Date: 09/27/2021 ?CLINICAL DATA:  Abdominal pain. Elevated lipase. Nausea and vomiting for a week. EXAM: CT ABDOMEN  AND PELVIS WITH CONTRAST TECHNIQUE: Multidetector CT imaging of the abdomen and pelvis was performed using the standard protocol following bolus administration of intravenous contrast. RADIATION DOSE REDUCTION: This exam was performed according to the departmental dose-optimization program which includes automated exposure control, adjustment of the mA and/or kV according to patient size and/or use of iterative reconstruction technique. CONTRAST:  100mL OMNIPAQUE IOHEXOL 300 MG/ML  SOLN COMPARISON:  September 06, 2012 FINDINGS: Lower chest: No acute abnormality. Hepatobiliary: Hepatic steatosis. Previous cholecystectomy. The liver and portal vein are otherwise normal. Pancreas: The pancreas is normal in appearance. No pancreatic mass. No peripancreatic stranding. Spleen: Normal in size without focal abnormality. Adrenals/Urinary Tract: There is a 2 mm stone in the upper pole the left kidney on coronal image 33. No other renal stones are identified. No suspicious renal masses. No hydronephrosis or perinephric stranding. The ureters and bladder are normal. Stomach/Bowel: The stomach is mildly distended with fluid. The stomach is otherwise normal in appearance. The small bowel is unremarkable. The colon and appendix  are normal. Vascular/Lymphatic: Calcified atherosclerotic changes seen in the nonaneurysmal aorta. No adenopathy. Reproductive: Status post hysterectomy. No adnexal masses. Other: No abdominal wall hernia or abnormality. No abdominopelvic ascites. Musculoskeletal: No acute or significant osseous findings. IMPRESSION: 1. No imaging evidence of pancreatitis on today's study despite the elevated lipase. 2. The stomach is mildly distended containing fluid. This is a nonspecific finding. 3. Hepatic steatosis.  Previous cholecystectomy. 4. 2 mm nonobstructive stone in the upper pole of the left kidney. 5. Calcified atherosclerotic change in the nonaneurysmal aorta. Electronically Signed   By: Gerome Sam III  M.D.   On: 09/27/2021 16:23  ? ?DG Chest Portable 1 View ? ?Result Date: 09/27/2021 ?CLINICAL DATA:  sob EXAM: PORTABLE CHEST 1 VIEW COMPARISON:  06/16/2021. FINDINGS: Chronic mild prominence of the lung markings

## 2021-09-29 NOTE — Progress Notes (Signed)
Inpatient Diabetes Program Recommendations ? ?AACE/ADA: New Consensus Statement on Inpatient Glycemic Control (2015) ? ?Target Ranges:  Prepandial:   less than 140 mg/dL ?     Peak postprandial:   less than 180 mg/dL (1-2 hours) ?     Critically ill patients:  140 - 180 mg/dL  ? ?Lab Results  ?Component Value Date  ? GLUCAP 236 (H) 09/29/2021  ? HGBA1C 10.6 (H) 09/21/2021  ? ? ?Review of Glycemic Control ? ?Diabetes history: T2DM ?Outpatient Diabetes medications: Trulicity 3 mg weekly, Synjardy 25/1000 mg QD ?Current orders for Inpatient glycemic control: IV insulin transitioning to Semglee 10 units QD, Novolog 0-15 units TID with meals and 0-5 HS ? ?HgbA1C 10.6% ?Will likely need meal coverage insulin ? ?Inpatient Diabetes Program Recommendations:   ? ?Novolog 3 units TID with meals ? ?Educated patient on insulin pen use at home. Reviewed contents of insulin flexpen starter kit. Reviewed all steps if insulin pen including attachment of needle, 2-unit air shot, dialing up dose, giving injection, removing needle, disposal of sharps, storage of unused insulin, disposal of insulin etc. Patient able to provide successful return demonstration. Also reviewed troubleshooting with insulin pen. MD to give patient Rxs for insulin pens and insulin pen needles. ? ?Continue to follow. ? ?Thank you. ?Lorenda Peck, RD, LDN, CDE ?Inpatient Diabetes Coordinator ?[phone]  ? ? ? ? ? ?

## 2021-09-30 LAB — GLUCOSE, CAPILLARY
Glucose-Capillary: 164 mg/dL — ABNORMAL HIGH (ref 70–99)
Glucose-Capillary: 186 mg/dL — ABNORMAL HIGH (ref 70–99)
Glucose-Capillary: 191 mg/dL — ABNORMAL HIGH (ref 70–99)
Glucose-Capillary: 229 mg/dL — ABNORMAL HIGH (ref 70–99)

## 2021-09-30 NOTE — Progress Notes (Signed)
Patient decided to leave AMA.  Surveyor, quantity and RN explained to patient the risks involved in leaving AMA.  MD notified.  Patient signed AMA form.  No PIV present.  Cardiac monitoring removed.  Patient escorted to main entrance with belongings for transport home with husband. ? ?Angie Fava, RN  ?

## 2021-10-01 ENCOUNTER — Ambulatory Visit: Payer: Medicare Other | Admitting: Cardiology

## 2021-10-03 ENCOUNTER — Telehealth: Payer: Self-pay | Admitting: Medical

## 2021-10-03 NOTE — Telephone Encounter (Signed)
Can this pt be called early on Monday early. Can she be offered 1 pm appt slot. She was scheduled for 11 am last time and I was late. Got off on wrong step that day and hate to be late again. Can see earlier in morning  on mondayI have very complicated pt that always takes me 40 minute or longer. So wanting to avoid being late again. Going forward can we not put her in 11 am slot. ?

## 2021-10-05 ENCOUNTER — Inpatient Hospital Stay: Payer: Medicare Other | Admitting: Medical

## 2021-10-05 ENCOUNTER — Ambulatory Visit (INDEPENDENT_AMBULATORY_CARE_PROVIDER_SITE_OTHER): Payer: Medicare Other | Admitting: Medical

## 2021-10-05 VITALS — BP 149/80 | HR 77 | Temp 98.2°F | Resp 18 | Ht 61.0 in | Wt 189.2 lb

## 2021-10-05 DIAGNOSIS — E119 Type 2 diabetes mellitus without complications: Secondary | ICD-10-CM

## 2021-10-05 DIAGNOSIS — R5383 Other fatigue: Secondary | ICD-10-CM

## 2021-10-05 DIAGNOSIS — D72829 Elevated white blood cell count, unspecified: Secondary | ICD-10-CM

## 2021-10-05 DIAGNOSIS — R748 Abnormal levels of other serum enzymes: Secondary | ICD-10-CM | POA: Diagnosis not present

## 2021-10-05 DIAGNOSIS — I10 Essential (primary) hypertension: Secondary | ICD-10-CM | POA: Diagnosis not present

## 2021-10-05 MED ORDER — METFORMIN HCL ER 500 MG PO TB24: 500.0000 mg | ORAL_TABLET | Freq: Every day | ORAL | 3 refills | Status: DC

## 2021-10-05 NOTE — Telephone Encounter (Signed)
Pt called at 8:55 am stating she was not happy about her appointment time for today being moved from 11 am to 1 pm.  I asked Dahlia Client, CMA, about this and she stated she had called the pt at [facility] request to move her so she would be the first pt after lunch.  I explained this to the pt and stated this would ensure that she would be the first pt seen after lunch and that way there would be no risk of the provider running behind and she could be seen and in and out of the office in a timely matter.  She stated she was concerned about the change in the time and was tearful in her conversation with me stating she knew the practice administrator had spoken to [place] and was concerned how she would be treated at her visit.  I assured her nothing would be any different and by offering her the 1 pm time, that was to ensure she would get in and out of her appointment and she would be the first one after lunch. ?

## 2021-10-05 NOTE — Telephone Encounter (Signed)
Pt moved to 1:00 pm ?

## 2021-10-05 NOTE — Progress Notes (Signed)
? ?Subjective:  ? ? Patient ID: Megan Murray, female    DOB: 1966-04-23, 56 y.o.   MRN: [id number] ? ?HPI ?Had a conversation at onset explaining wanted her to be seen at 1 pm in order for her not to have to wait on me in excess of one hour as new would run behind after seeing very complicated elderly pt that always takes excessive amount of time. Explained to her today spent excess of 40 minutes with that pt.  ? ? ? ?Pt in for follow up from hospitalization. ? ? ?Reviewed admission notes. ? ?Below is the DC summary. ? ?"Recommendations at discharge:  ?  ?Follow-up PCP in 1 week ?  ?Discharge Diagnoses: ?Principal Problem: ?  DKA (diabetic ketoacidosis) ([place]) ?Active Problems: ?  Familial hypertriglyceridemia ?  Essential hypertension ?  Type 2 diabetes mellitus without complication, without [place]-term current use of insulin ([place]) ?  Cough variant asthma ?  Prolonged QT interval ? ?Hospital Course: ?56 year old female with a history of diabetes mellitus type 2, hypertension, hyperlipidemia presented with nausea and vomiting.  Recently PCP increased her lisinopril.  She is currently on Trulicity.  Recently patient's dose of Trulicity was increased.  Soon after that she started feeling unwell developed nausea and vomiting.  Hemoglobin A1c is days around  ?  ?Assessment and Plan: ?  ?Euglycemic diabetic ketoacidosis ?-Resolved; likely due to Jardiance and Trulicity ?-Patient was treated with IV insulin, anion gap has resolved ?-We will discontinue both Jardiance and Trulicity at this time ?  ?Diabetes mellitus type 2 ?-As above we will discontinue both Jardiance and Trulicity ?-Jardiance is known to cause euglycemic DKA ?-I will start patient on Lantus 15 units subcu daily ?-Continue metformin 500 mg p.o. twice daily ?-Patient can follow-up with her PCP in 1 week for adjustment of insulin versus addition of other medications ?  ?Hypertension ?-Blood pressure is stable ?-Continue home regimen ?  ?Family and  hypertriglyceridemia ?-Continue Zetia ?  ?Asthma ?-Stable, continue albuterol as needed ?  ?Hypokalemia ?-Potassium is 2.9 ?-We will replace potassium before discharge" ? ? ?Pt states overall she feels a lot better than when she was admitted. She state her sugars have been 230. ? ?He blood pressures at home have been 150/80. On losartan 50 mg daily.  ? ?She want to use metformin er rather than regular.  ? ?For high cholesterol on zetia 10 mg daily. ? ? ?Also on review lipase was elevated. ? ? ?Also on review wbc was elevated. ? ? ?Review of Systems  ?Constitutional:  Negative for chills, fatigue and fever.  ?HENT:  Negative for congestion, drooling, ear discharge and ear pain.   ?Respiratory:  Negative for cough, chest tightness, shortness of breath and wheezing.   ?Cardiovascular:  Negative for chest pain and palpitations.  ?Gastrointestinal:  Negative for abdominal pain.  ?Endocrine: Negative for polydipsia, polyphagia and polyuria.  ?Genitourinary:  Negative for difficulty urinating and dysuria.  ?Musculoskeletal:  Negative for back pain and gait problem.  ?Neurological:  Negative for dizziness, light-headedness and numbness.  ?Hematological:  Negative for adenopathy. Does not bruise/bleed easily.  ?Psychiatric/Behavioral:  Negative for behavioral problems, decreased concentration, dysphoric mood and hallucinations.   ? ? ?Past Medical History:  ?Diagnosis Date  ? Asthma   ? Diabetes mellitus without complication ([place])   ? Dyslipidemia, goal to be determined   ? High triglycerides and LDL  ? Fatty liver 05/16/2018  ? By CT scan  ? Fibromyalgia   ? Hyperlipidemia   ?  Hypertension   ? Major depression, recurrent, chronic (HCC) 04/25/2018  ? Dr. Evelene CroonKaur  ? Obesity due to excess calories without serious comorbidity   ? Osteoarthritis   ? ?  ?Social History  ? ?Socioeconomic History  ? Marital status: Married  ?  Spouse name: Not on file  ? Number of children: 1  ? Years of education: Not on file  ? Highest education  level: Not on file  ?Occupational History  ? Occupation: disabled due to fibromyalgia and depression  ?Tobacco Use  ? Smoking status: Never  ? Smokeless tobacco: Never  ?Vaping Use  ? Vaping Use: Never used  ?Substance and Sexual Activity  ? Alcohol use: No  ? Drug use: No  ? Sexual activity: Yes  ?Other Topics Concern  ? Not on file  ?Social History Narrative  ? Not on file  ? ?Social Determinants of Health  ? ?Financial Resource Strain: Low Risk   ? Difficulty of Paying Living Expenses: Not hard at all  ?Food Insecurity: No Food Insecurity  ? Worried About Programme researcher, broadcasting/film/videounning Out of Food in the Last Year: Never true  ? Ran Out of Food in the Last Year: Never true  ?Transportation Needs: No Transportation Needs  ? Lack of Transportation (Medical): No  ? Lack of Transportation (Non-Medical): No  ?Physical Activity: Not on file  ?Stress: No Stress Concern Present  ? Feeling of Stress : Not at all  ?Social Connections: Moderately Integrated  ? Frequency of Communication with Friends and Family: More than three times a week  ? Frequency of Social Gatherings with Friends and Family: Twice a week  ? Attends Religious Services: More than 4 times per year  ? Active Member of Clubs or Organizations: No  ? Attends BankerClub or Organization Meetings: Never  ? Marital Status: Married  ?Intimate Partner Violence: Not At Risk  ? Fear of Current or Ex-Partner: No  ? Emotionally Abused: No  ? Physically Abused: No  ? Sexually Abused: No  ? ? ?Past Surgical History:  ?Procedure Laterality Date  ? ABDOMINAL HYSTERECTOMY    ? REDUCTION MAMMAPLASTY    ? ? ?Family History  ?Problem Relation Age of Onset  ? COPD Mother   ?     Died at 6063  ? Heart failure Mother   ?     Not sure of details  ? Esophageal cancer Father   ?     Survived cancer treatment and surgery  ? Drug abuse Father   ? Cirrhosis Father   ?     NAFLD  ? Healthy Sister   ? Diabetes Brother   ? Depression Brother   ? Heart attack Brother   ? Heart disease Brother   ? Diabetes Maternal  Grandmother   ? Arthritis Maternal Grandmother   ? Cancer Paternal Grandmother   ? Hearing loss Paternal Grandmother   ? Liver disease Paternal Grandfather   ? Diabetes Maternal Grandfather   ? ? ?Allergies  ?Allergen Reactions  ? Codeine Palpitations  ?  Natural or Synthetic Codeine  ? Atorvastatin Nausea Only and Other (See Comments)  ?  Myalgias  ? Cholestyramine Diarrhea and Nausea Only  ? Crestor [Rosuvastatin Calcium] Nausea Only and Other (See Comments)  ?  Myalgias  ? Fenofibrate Diarrhea and Nausea And Vomiting  ? Simvastatin Nausea Only and Other (See Comments)  ?  Myalgias  ? Soy Allergy Diarrhea and Nausea And Vomiting  ? Statins Nausea Only and Other (See Comments)  ?  Myalgias  ? Repatha [Evolocumab] Other (See Comments)  ?  flu  ? Meloxicam Rash  ? ? ?Current Outpatient Medications on File Prior to Visit  ?Medication Sig Dispense Refill  ? albuterol (VENTOLIN HFA) 108 (90 Base) MCG/ACT inhaler Inhale 2 puffs into the lungs every 6 (six) hours as needed for wheezing or shortness of breath. 6.7 g 2  ? aspirin EC 81 MG tablet Take 1 tablet (81 mg total) by mouth daily. Swallow whole. 90 tablet 3  ? diclofenac (VOLTAREN) 75 MG EC tablet Take 1 tablet (75 mg total) by mouth 2 (two) times daily. (Patient taking differently: Take 75 mg by mouth 2 (two) times daily as needed for moderate pain.) 60 tablet 1  ? diclofenac sodium (VOLTAREN) 1 % GEL Apply 2 g topically 4 (four) times daily.    ? ezetimibe (ZETIA) 10 MG tablet TAKE 1 TABLET(10 MG) BY MOUTH DAILY (Patient taking differently: Take 10 mg by mouth daily.) 90 tablet 2  ? FLUoxetine (PROZAC) 40 MG capsule TAKE 1 CAPSULE(40 MG) BY MOUTH DAILY (Patient taking differently: Take 40 mg by mouth daily. TAKE 1 CAPSULE(40 MG) BY MOUTH DAILY) 90 capsule 0  ? glucose blood (ONETOUCH VERIO) test strip Test blood sugar glucose TID dx:E11.9 100 each 12  ? insulin glargine (LANTUS) 100 UNIT/ML Solostar Pen Inject 15 Units into the skin at bedtime. 15 mL 3  ?  lidocaine (LIDODERM) 5 % Place 2 patches onto the skin daily. Suggest neck/shoulders- Remove & Discard patch within 12 hours or as directed by MD (Patient taking differently: Place 2 patches onto the skin daily as needed (pain).

## 2021-10-05 NOTE — Patient Instructions (Addendum)
Diabetes with historically recent A1c was in the 7 range but most recently was 10.6.  Med adjustment made and when Trulicity was increased she developed euglycemic diabetic ketoacidosis.  Now using Lantus at night and metformin 500 mg twice daily.  Explained how to titrate up insulin by 1 to 2 units based on fasting morning sugar check(currently on 15 units).  Stop titration when you reach 100.  Sent in metformin 500 extended release to pharmacy today. ? ? ?Placed referral to endocrinologist.  We will investigate and try to prescribe continuous glucose monitor. ? ?Hypertension-blood pressure mild elevated.  Would prefer that you have blood pressure closer to 130/80.  Check blood pressure daily over the next 3 days and update me Thursday morning with those readings.  If blood pressure not close to 130/80 would recommend increasing losartan to 100 mg daily. ? ?Hyperlipidemia-continue Zetia. ? ?Will get CMP, CBC and lipase level today for repeat labs following hospitalization. ? ?For fatigue added TSH, T4, B12 and iron level. ? ?Follow-up date to be determined after lab review. ?

## 2021-10-06 ENCOUNTER — Other Ambulatory Visit: Payer: Self-pay

## 2021-10-06 ENCOUNTER — Encounter: Payer: Self-pay | Admitting: Medical

## 2021-10-06 DIAGNOSIS — E119 Type 2 diabetes mellitus without complications: Secondary | ICD-10-CM

## 2021-10-06 LAB — COMPREHENSIVE METABOLIC PANEL
ALT: 25 U/L (ref 0–35)
AST: 18 U/L (ref 0–37)
Albumin: 3.9 g/dL (ref 3.5–5.2)
Alkaline Phosphatase: 62 U/L (ref 39–117)
BUN: 10 mg/dL (ref 6–23)
CO2: 25 mEq/L (ref 19–32)
Calcium: 8.9 mg/dL (ref 8.4–10.5)
Chloride: 102 mEq/L (ref 96–112)
Creatinine, Ser: 0.38 mg/dL — ABNORMAL LOW (ref 0.40–1.20)
GFR: 112.04 mL/min (ref >60.00)
Glucose, Bld: 236 mg/dL — ABNORMAL HIGH (ref 70–99)
Potassium: 4.1 mEq/L (ref 3.5–5.1)
Sodium: 136 mEq/L (ref 135–145)
Total Bilirubin: 0.5 mg/dL (ref 0.2–1.2)
Total Protein: 6 g/dL (ref 6.0–8.3)

## 2021-10-06 LAB — CBC WITH DIFFERENTIAL/PLATELET
Basophils Absolute: 0.1 10*3/uL (ref 0.0–0.1)
Basophils Relative: 1 % (ref 0.0–3.0)
Eosinophils Absolute: 0.1 10*3/uL (ref 0.0–0.7)
Eosinophils Relative: 1.7 % (ref 0.0–5.0)
HCT: 41.9 % (ref 36.0–46.0)
Hemoglobin: 14 g/dL (ref 12.0–15.0)
Lymphocytes Relative: 33.6 % (ref 12.0–46.0)
Lymphs Abs: 1.9 10*3/uL (ref 0.7–4.0)
MCHC: 33.4 g/dL (ref 30.0–36.0)
MCV: 91 fl (ref 78.0–100.0)
Monocytes Absolute: 0.6 10*3/uL (ref 0.1–1.0)
Monocytes Relative: 10.9 % (ref 3.0–12.0)
Neutro Abs: 2.9 10*3/uL (ref 1.4–7.7)
Neutrophils Relative %: 52.8 % (ref 43.0–77.0)
Platelets: 195 10*3/uL (ref 150.0–400.0)
RBC: 4.61 Mil/uL (ref 3.87–5.11)
RDW: 14.1 % (ref 11.5–15.5)
WBC: 5.5 10*3/uL (ref 4.0–10.5)

## 2021-10-06 LAB — IRON: Iron: 70 ug/dL (ref 42–145)

## 2021-10-06 LAB — TSH: TSH: 1.1 u[IU]/mL (ref 0.35–5.50)

## 2021-10-06 LAB — T4, FREE: Free T4: 0.82 ng/dL (ref 0.60–1.60)

## 2021-10-06 LAB — VITAMIN B12: Vitamin B-12: 343 pg/mL (ref 211–911)

## 2021-10-06 LAB — LIPASE: Lipase: 72 U/L — ABNORMAL HIGH (ref 11.0–59.0)

## 2021-10-06 MED ORDER — GLUCOSE BLOOD VI STRP: ORAL_STRIP | 12 refills | Status: DC

## 2021-10-07 NOTE — Addendum Note (Signed)
Addended by: Gwenevere Abbot on: 10/07/2021 01:55 PM ? ? Modules accepted: Orders ? ?

## 2021-10-08 ENCOUNTER — Encounter: Payer: Self-pay | Admitting: Medical

## 2021-10-12 ENCOUNTER — Encounter: Payer: Self-pay | Admitting: Medical

## 2021-10-12 ENCOUNTER — Other Ambulatory Visit (INDEPENDENT_AMBULATORY_CARE_PROVIDER_SITE_OTHER): Payer: Medicare Other

## 2021-10-12 DIAGNOSIS — R748 Abnormal levels of other serum enzymes: Secondary | ICD-10-CM

## 2021-10-12 LAB — LIPASE: Lipase: 40 U/L (ref 11.0–59.0)

## 2021-10-18 ENCOUNTER — Other Ambulatory Visit: Payer: Self-pay | Admitting: Medical

## 2021-10-19 NOTE — Telephone Encounter (Addendum)
Requesting: tramadol ?Contract: 09/21/2021 ?UDS:09/21/21 ?Last Visit:10/05/21 ?Next Visit:n/a ?Last Refill:08/15/21 ? ?Please Advise  ? ?Rx refill sent to pt pharmacy. ? ?Esperanza Richters, PA-C  ?

## 2021-10-26 ENCOUNTER — Ambulatory Visit: Payer: Medicare Other | Admitting: Medical

## 2021-10-27 ENCOUNTER — Encounter: Payer: Self-pay | Admitting: Medical

## 2021-10-27 ENCOUNTER — Other Ambulatory Visit: Payer: Self-pay | Admitting: *Deleted

## 2021-10-27 ENCOUNTER — Ambulatory Visit (INDEPENDENT_AMBULATORY_CARE_PROVIDER_SITE_OTHER): Payer: Medicare Other | Admitting: Medical

## 2021-10-27 ENCOUNTER — Other Ambulatory Visit: Payer: Self-pay | Admitting: Medical

## 2021-10-27 VITALS — BP 122/65 | HR 100 | Temp 98.0°F | Resp 18 | Ht 61.0 in | Wt 190.0 lb

## 2021-10-27 DIAGNOSIS — E1169 Type 2 diabetes mellitus with other specified complication: Secondary | ICD-10-CM | POA: Diagnosis not present

## 2021-10-27 DIAGNOSIS — I10 Essential (primary) hypertension: Secondary | ICD-10-CM | POA: Diagnosis not present

## 2021-10-27 DIAGNOSIS — E785 Hyperlipidemia, unspecified: Secondary | ICD-10-CM | POA: Diagnosis not present

## 2021-10-27 DIAGNOSIS — E119 Type 2 diabetes mellitus without complications: Secondary | ICD-10-CM

## 2021-10-27 DIAGNOSIS — F5101 Primary insomnia: Secondary | ICD-10-CM

## 2021-10-27 MED ORDER — FREESTYLE LIBRE 2 READER DEVI: 0 refills | Status: DC

## 2021-10-27 MED ORDER — NOVOLOG FLEXPEN 100 UNIT/ML ~~LOC~~ SOPN: 6.0000 [IU] | PEN_INJECTOR | Freq: Three times a day (TID) | SUBCUTANEOUS | 11 refills | Status: DC

## 2021-10-27 MED ORDER — FREESTYLE LIBRE 2 SENSOR MISC: 5 refills | Status: DC

## 2021-10-27 NOTE — Patient Instructions (Addendum)
Diabetes with persisting high sugars despite increasing Lantus to 70 units daily.  The lowest you sugar you have gotten has been in the 180 range.  We will see if continuous glucose monitor covered by your insurance.  Today also adding NovoLog mealtime insulin.  I discussed how to use and gave you sliding scale.  Giving update in 5 to 7 days on how your sugars are doing.  Placed new referral to endocrinologist since current referral did not get processed/could not accommodate due to that office having recent retirement.  Earliest appointment was approximately October. ? ?Hypertension-blood pressure better controlled.  Blood pressure reading 122/65 yesterday.  Continue current hypertensive meds. ? ?Hyperlipidemia-continue low-cholesterol diet and Zetia. ? ?Follow-up in 2 to 3 weeks or sooner if needed. ?

## 2021-10-27 NOTE — Progress Notes (Signed)
? ?Subjective:  ? ? Patient ID: Megan Murray, female    DOB: 1966-05-28, 56 y.o.   MRN: [id number] ? ?HPI ? ?Pt in for follow up.  ? ?Pt states her sugar finally coming down and now on 70 units of insulin. She is exercising daily and eating low sugar. I have referred to endocrinologist. She has not been called yet. Lowest sugar seen has been in 180 range despite diet, meds and exercise. ? ? ?Pt bp 122/65 range daily.  ? ?Hyperlipidemia- pt is on zetia 10 mg daily. ? ? ? ?Review of Systems  ?Constitutional:  Negative for chills, fatigue and fever.  ?HENT:  Negative for congestion, drooling, ear discharge and ear pain.   ?Respiratory:  Negative for cough, chest tightness, shortness of breath and wheezing.   ?Cardiovascular:  Negative for chest pain and palpitations.  ?Gastrointestinal:  Negative for abdominal pain.  ?Musculoskeletal:  Negative for back pain.  ?Skin:  Negative for rash.  ?Hematological:  Negative for adenopathy. Does not bruise/bleed easily.  ?Psychiatric/Behavioral:  Negative for behavioral problems and confusion. The patient is not nervous/anxious.   ? ? ?Past Medical History:  ?Diagnosis Date  ? Asthma   ? Diabetes mellitus without complication (HCC)   ? Dyslipidemia, goal to be determined   ? High triglycerides and LDL  ? Fatty liver 05/16/2018  ? By CT scan  ? Fibromyalgia   ? Hyperlipidemia   ? Hypertension   ? Major depression, recurrent, chronic (HCC) 04/25/2018  ? Dr. Evelene Croon  ? Obesity due to excess calories without serious comorbidity   ? Osteoarthritis   ? ?  ?Social History  ? ?Socioeconomic History  ? Marital status: Married  ?  Spouse name: Not on file  ? Number of children: 1  ? Years of education: Not on file  ? Highest education level: Not on file  ?Occupational History  ? Occupation: disabled due to fibromyalgia and depression  ?Tobacco Use  ? Smoking status: Never  ? Smokeless tobacco: Never  ?Vaping Use  ? Vaping Use: Never used  ?Substance and Sexual Activity  ? Alcohol use: No  ?  Drug use: No  ? Sexual activity: Yes  ?Other Topics Concern  ? Not on file  ?Social History Narrative  ? Not on file  ? ?Social Determinants of Health  ? ?Financial Resource Strain: Low Risk   ? Difficulty of Paying Living Expenses: Not hard at all  ?Food Insecurity: No Food Insecurity  ? Worried About Programme researcher, broadcasting/film/video in the Last Year: Never true  ? Ran Out of Food in the Last Year: Never true  ?Transportation Needs: No Transportation Needs  ? Lack of Transportation (Medical): No  ? Lack of Transportation (Non-Medical): No  ?Physical Activity: Not on file  ?Stress: No Stress Concern Present  ? Feeling of Stress : Not at all  ?Social Connections: Moderately Integrated  ? Frequency of Communication with Friends and Family: More than three times a week  ? Frequency of Social Gatherings with Friends and Family: Twice a week  ? Attends Religious Services: More than 4 times per year  ? Active Member of Clubs or Organizations: No  ? Attends Banker Meetings: Never  ? Marital Status: Married  ?Intimate Partner Violence: Not At Risk  ? Fear of Current or Ex-Partner: No  ? Emotionally Abused: No  ? Physically Abused: No  ? Sexually Abused: No  ? ? ?Past Surgical History:  ?Procedure Laterality Date  ? ABDOMINAL  HYSTERECTOMY    ? REDUCTION MAMMAPLASTY    ? ? ?Family History  ?Problem Relation Age of Onset  ? COPD Mother   ?     Died at 7663  ? Heart failure Mother   ?     Not sure of details  ? Esophageal cancer Father   ?     Survived cancer treatment and surgery  ? Drug abuse Father   ? Cirrhosis Father   ?     NAFLD  ? Healthy Sister   ? Diabetes Brother   ? Depression Brother   ? Heart attack Brother   ? Heart disease Brother   ? Diabetes Maternal Grandmother   ? Arthritis Maternal Grandmother   ? Cancer Paternal Grandmother   ? Hearing loss Paternal Grandmother   ? Liver disease Paternal Grandfather   ? Diabetes Maternal Grandfather   ? ? ?Allergies  ?Allergen Reactions  ? Codeine Palpitations  ?  Natural  or Synthetic Codeine  ? Atorvastatin Nausea Only and Other (See Comments)  ?  Myalgias  ? Cholestyramine Diarrhea and Nausea Only  ? Crestor [Rosuvastatin Calcium] Nausea Only and Other (See Comments)  ?  Myalgias  ? Fenofibrate Diarrhea and Nausea And Vomiting  ? Simvastatin Nausea Only and Other (See Comments)  ?  Myalgias  ? Soy Allergy Diarrhea and Nausea And Vomiting  ? Statins Nausea Only and Other (See Comments)  ?   Myalgias  ? Repatha [Evolocumab] Other (See Comments)  ?  flu  ? Meloxicam Rash  ? ? ?Current Outpatient Medications on File Prior to Visit  ?Medication Sig Dispense Refill  ? albuterol (VENTOLIN HFA) 108 (90 Base) MCG/ACT inhaler Inhale 2 puffs into the lungs every 6 (six) hours as needed for wheezing or shortness of breath. 6.7 g 2  ? aspirin EC 81 MG tablet Take 1 tablet (81 mg total) by mouth daily. Swallow whole. 90 tablet 3  ? diclofenac (VOLTAREN) 75 MG EC tablet Take 1 tablet (75 mg total) by mouth 2 (two) times daily. (Patient taking differently: Take 75 mg by mouth 2 (two) times daily as needed for moderate pain.) 60 tablet 1  ? diclofenac sodium (VOLTAREN) 1 % GEL Apply 2 g topically 4 (four) times daily.    ? ezetimibe (ZETIA) 10 MG tablet TAKE 1 TABLET(10 MG) BY MOUTH DAILY (Patient taking differently: Take 10 mg by mouth daily.) 90 tablet 2  ? FLUoxetine (PROZAC) 40 MG capsule TAKE 1 CAPSULE(40 MG) BY MOUTH DAILY (Patient taking differently: Take 40 mg by mouth daily. TAKE 1 CAPSULE(40 MG) BY MOUTH DAILY) 90 capsule 0  ? glucose blood test strip Use as instructed: One touch Ultra E11.9 100 each 12  ? insulin glargine (LANTUS) 100 UNIT/ML Solostar Pen Inject 15 Units into the skin at bedtime. 15 mL 3  ? lidocaine (LIDODERM) 5 % Place 2 patches onto the skin daily. Suggest neck/shoulders- Remove & Discard patch within 12 hours or as directed by MD (Patient taking differently: Place 2 patches onto the skin daily as needed (pain). Suggest neck/shoulders- Remove & Discard patch within 12  hours or as directed by MD) 60 patch 5  ? losartan (COZAAR) 50 MG tablet Take 1 tablet (50 mg total) by mouth daily. 90 tablet 0  ? metFORMIN (GLUCOPHAGE-XR) 500 MG 24 hr tablet Take 1 tablet (500 mg total) by mouth daily with breakfast. 30 tablet 3  ? pantoprazole (PROTONIX) 40 MG tablet Take 1 tablet (40 mg total) by mouth daily.  90 tablet 1  ? traMADol (ULTRAM) 50 MG tablet TAKE 1 TABLET(50 MG) BY MOUTH EVERY 8 HOURS FOR UP TO 5 DAYS AS NEEDED 30 tablet 0  ? zolpidem (AMBIEN) 10 MG tablet TAKE 1 TABLET BY MOUTH AT BEDTIME AS NEEDED FOR INSOMNIA (Patient taking differently: Take 10 mg by mouth at bedtime.) 30 tablet 1  ? ?No current facility-administered medications on file prior to visit.  ? ? ?BP 122/65   Pulse 100   Temp 98 ?F (36.7 ?C)   Resp 18   Ht 5\' 1"  (1.549 m)   Wt 190 lb (86.2 kg)   SpO2 95%   BMI 35.90 kg/m?  ?  ?   ?Objective:  ? Physical Exam ? ?General ?Mental Status- Alert. General Appearance- Not in acute distress.  ? ?Skin ?General: Color- Normal Color. Moisture- Normal Moisture. ? ?Neck ?Carotid Arteries- Normal color. Moisture- Normal Moisture. No carotid bruits. No JVD. ? ?Chest and Lung Exam ?Auscultation: ?Breath Sounds:-Normal. ? ?Cardiovascular ?Auscultation:Rythm- Regular. ?Murmurs & Other Heart Sounds:Auscultation of the heart reveals- No Murmurs. ? ?Abdomen ?Inspection:-Inspeection Normal. ?Palpation/Percussion:Note:No mass. Palpation and Percussion of the abdomen reveal- Non Tender, Non Distended + BS, no rebound or guarding. ? ?Neurologic ?Cranial Nerve exam:- CN III-XII intact(No nystagmus), symmetric smile. ?Strength:- 5/5 equal and symmetric strength both upper and lower extremities.  ? ? ?   ?Assessment & Plan:  ? ?Patient Instructions  ?Diabetes with persisting high sugars despite increasing Lantus to 70 units daily.  The lowest you sugar you have gotten has been in the 180 range.  We will see if continuous glucose monitor covered by your insurance.  Today also adding  NovoLog mealtime insulin.  I discussed how to use and gave you sliding scale.  Giving update in 5 to 7 days on how your sugars are doing.  Placed new referral to endocrinologist since current referral did not get p

## 2021-10-28 ENCOUNTER — Telehealth: Payer: Self-pay

## 2021-10-28 ENCOUNTER — Telehealth: Payer: Self-pay | Admitting: Medical

## 2021-10-28 MED ORDER — INSULIN LISPRO (1 UNIT DIAL) 100 UNIT/ML (KWIKPEN): 6.0000 [IU] | PEN_INJECTOR | Freq: Three times a day (TID) | SUBCUTANEOUS | 11 refills | Status: DC

## 2021-10-28 MED ORDER — INSULIN GLARGINE 100 UNIT/ML SOLOSTAR PEN: PEN_INJECTOR | SUBCUTANEOUS | 3 refills | Status: DC

## 2021-10-28 NOTE — Telephone Encounter (Signed)
Rx lantus sent to pharmacy. ? ?Esperanza Richters, PA-C  ?

## 2021-10-28 NOTE — Telephone Encounter (Signed)
Pt's insurance does not cover Novolog, it does look like they prefer Humalog. Okay to switch?  ?

## 2021-10-28 NOTE — Telephone Encounter (Addendum)
Requesting: Remus Loffler ?Contract: 09/21/21 ?UDS:09/21/21 ?Last Visit:10/27/21 ?Next Visit:n/a ?Last Refill:08/24/21 ? ?Please Advise  ? ?Rx refill sent to pt pharmacy. ? ?Esperanza Richters, PA-C  ?

## 2021-11-06 ENCOUNTER — Encounter: Payer: Self-pay | Admitting: Medical

## 2021-11-07 NOTE — Addendum Note (Signed)
Addended by: Gwenevere Abbot on: 11/07/2021 07:20 AM ? ? Modules accepted: Orders ? ?

## 2021-11-11 NOTE — Telephone Encounter (Signed)
Forms dropped off  ? ?Place in [place] bin up front ? ?

## 2021-11-15 ENCOUNTER — Encounter: Payer: Self-pay | Admitting: Medical

## 2021-11-16 DIAGNOSIS — Z0279 Encounter for issue of other medical certificate: Secondary | ICD-10-CM

## 2021-11-23 ENCOUNTER — Ambulatory Visit (INDEPENDENT_AMBULATORY_CARE_PROVIDER_SITE_OTHER): Payer: Medicare Other | Admitting: Medical

## 2021-11-23 ENCOUNTER — Encounter: Payer: Self-pay | Admitting: Medical

## 2021-11-23 VITALS — BP 134/74 | HR 85 | Temp 98.0°F | Resp 18 | Ht 61.0 in | Wt 198.2 lb

## 2021-11-23 DIAGNOSIS — J9801 Acute bronchospasm: Secondary | ICD-10-CM

## 2021-11-23 DIAGNOSIS — E669 Obesity, unspecified: Secondary | ICD-10-CM | POA: Diagnosis not present

## 2021-11-23 DIAGNOSIS — E119 Type 2 diabetes mellitus without complications: Secondary | ICD-10-CM

## 2021-11-23 DIAGNOSIS — I10 Essential (primary) hypertension: Secondary | ICD-10-CM

## 2021-11-23 DIAGNOSIS — F339 Major depressive disorder, recurrent, unspecified: Secondary | ICD-10-CM

## 2021-11-23 DIAGNOSIS — R058 Other specified cough: Secondary | ICD-10-CM

## 2021-11-23 DIAGNOSIS — M797 Fibromyalgia: Secondary | ICD-10-CM

## 2021-11-23 NOTE — Progress Notes (Signed)
? ?Subjective:  ? ? Patient ID: Megan Murray, female    DOB: 08-Nov-1965, 56 y.o.   MRN: [id number] ? ?HPI ?Pt in for follow up. ? ?Pt has seen endocrinologist. Pt is now on tresiba. Pt has cgm and most of time her sugars have been 130 range since seeing endocrinologist.  ? ?Pt also has more aggressive sliding scale with novolog. ? ?Pt will see Dr. [name] in 4 months.  ? ?Since sugars have been under control she has gained back weight which she formerly lost. ? ?Pt still on metformin.  ? ?Pt has never been to healthy weight loss clinic. ? ? ?Htn- bp last night 130/72. She states anxious presently.  ? ? ?Pt has been getting disability since 2004. She has not been working since then. Diagnosis was fibromyalgia and depression. She states minimal activity will cause her to have pain, muscle tightness in upper extremity shoulder and lower ext pain. Pt also was diagnosed with myofascial pain syndrome. Pt got injections that helped but were not covered by insurance. She will have one acitivity a day that is minimal to avoid fibromyalgia. ? ?Pt also diagnosed with depression. Pt states Dr. Evelene Croon had encouraged her to file for disability. Pt states for years Dr. Evelene Croon filled out form every 2 years from 2004 to 2018. Then she could not see Dr Evelene Croon any more so her pcp in 2021 filled out.  ? ?Also last a1 was 10.6.  Hostpitalized. ? ?Phq-9 score 11 today. Pt states mood much worse in past. States doing prozac 40 mg daily. In past mood swings but now stable per pt. ? ?Review of Systems  ?Constitutional:  Negative for chills and fatigue.  ?Respiratory:  Negative for chest tightness, shortness of breath and wheezing.   ?Cardiovascular:  Negative for chest pain and palpitations.  ?Gastrointestinal:  Negative for abdominal pain, diarrhea and vomiting.  ?Genitourinary:  Negative for dyspareunia, enuresis and flank pain.  ?Musculoskeletal:  Positive for myalgias. Negative for back pain.  ?Skin:  Negative for rash.  ?Neurological:   Negative for dizziness, speech difficulty, numbness and headaches.  ?Psychiatric/Behavioral:  Positive for dysphoric mood. Negative for agitation, behavioral problems, decreased concentration, sleep disturbance and suicidal ideas.   ? ? ?Past Medical History:  ?Diagnosis Date  ? Asthma   ? Diabetes mellitus without complication (HCC)   ? Dyslipidemia, goal to be determined   ? High triglycerides and LDL  ? Fatty liver 05/16/2018  ? By CT scan  ? Fibromyalgia   ? Hyperlipidemia   ? Hypertension   ? Major depression, recurrent, chronic (HCC) 04/25/2018  ? Dr. Evelene Croon  ? Obesity due to excess calories without serious comorbidity   ? Osteoarthritis   ? ?  ?Social History  ? ?Socioeconomic History  ? Marital status: Married  ?  Spouse name: Not on file  ? Number of children: 1  ? Years of education: Not on file  ? Highest education level: Not on file  ?Occupational History  ? Occupation: disabled due to fibromyalgia and depression  ?Tobacco Use  ? Smoking status: Never  ? Smokeless tobacco: Never  ?Vaping Use  ? Vaping Use: Never used  ?Substance and Sexual Activity  ? Alcohol use: No  ? Drug use: No  ? Sexual activity: Yes  ?Other Topics Concern  ? Not on file  ?Social History Narrative  ? Not on file  ? ?Social Determinants of Health  ? ?Financial Resource Strain: Low Risk   ? Difficulty of Paying  Living Expenses: Not hard at all  ?Food Insecurity: No Food Insecurity  ? Worried About Programme researcher, broadcasting/film/videounning Out of Food in the Last Year: Never true  ? Ran Out of Food in the Last Year: Never true  ?Transportation Needs: No Transportation Needs  ? Lack of Transportation (Medical): No  ? Lack of Transportation (Non-Medical): No  ?Physical Activity: Not on file  ?Stress: No Stress Concern Present  ? Feeling of Stress : Not at all  ?Social Connections: Moderately Integrated  ? Frequency of Communication with Friends and Family: More than three times a week  ? Frequency of Social Gatherings with Friends and Family: Twice a week  ? Attends  Religious Services: More than 4 times per year  ? Active Member of Clubs or Organizations: No  ? Attends BankerClub or Organization Meetings: Never  ? Marital Status: Married  ?Intimate Partner Violence: Not At Risk  ? Fear of Current or Ex-Partner: No  ? Emotionally Abused: No  ? Physically Abused: No  ? Sexually Abused: No  ? ? ?Past Surgical History:  ?Procedure Laterality Date  ? ABDOMINAL HYSTERECTOMY    ? REDUCTION MAMMAPLASTY    ? ? ?Family History  ?Problem Relation Age of Onset  ? COPD Mother   ?     Died at [id number]  ? Heart failure Mother   ?     Not sure of details  ? Esophageal cancer Father   ?     Survived cancer treatment and surgery  ? Drug abuse Father   ? Cirrhosis Father   ?     NAFLD  ? Healthy Sister   ? Diabetes Brother   ? Depression Brother   ? Heart attack Brother   ? Heart disease Brother   ? Diabetes Maternal Grandmother   ? Arthritis Maternal Grandmother   ? Cancer Paternal Grandmother   ? Hearing loss Paternal Grandmother   ? Liver disease Paternal Grandfather   ? Diabetes Maternal Grandfather   ? ? ?Allergies  ?Allergen Reactions  ? Codeine Palpitations  ?  Natural or Synthetic Codeine  ? Atorvastatin Nausea Only and Other (See Comments)  ?  Myalgias  ? Cholestyramine Diarrhea and Nausea Only  ? Crestor [Rosuvastatin Calcium] Nausea Only and Other (See Comments)  ?  Myalgias  ? Fenofibrate Diarrhea and Nausea And Vomiting  ? Simvastatin Nausea Only and Other (See Comments)  ?  Myalgias  ? Soy Allergy Diarrhea and Nausea And Vomiting  ? Statins Nausea Only and Other (See Comments)  ?   Myalgias  ? Repatha [Evolocumab] Other (See Comments)  ?  flu  ? Meloxicam Rash  ? ? ?Current Outpatient Medications on File Prior to Visit  ?Medication Sig Dispense Refill  ? albuterol (VENTOLIN HFA) 108 (90 Base) MCG/ACT inhaler Inhale 2 puffs into the lungs every 6 (six) hours as needed for wheezing or shortness of breath. 6.7 g 2  ? aspirin EC 81 MG tablet Take 1 tablet (81 mg total) by mouth daily. Swallow  whole. 90 tablet 3  ? Continuous Blood Gluc Receiver (FREESTYLE LIBRE 2 READER) DEVI Use as directed 1 each 0  ? Continuous Blood Gluc Sensor (FREESTYLE LIBRE 2 SENSOR) MISC Use as directed 2 each 5  ? diclofenac (VOLTAREN) 75 MG EC tablet Take 1 tablet (75 mg total) by mouth 2 (two) times daily. (Patient taking differently: Take 75 mg by mouth 2 (two) times daily as needed for moderate pain.) 60 tablet 1  ? diclofenac sodium (VOLTAREN) 1 %  GEL Apply 2 g topically 4 (four) times daily.    ? ezetimibe (ZETIA) 10 MG tablet TAKE 1 TABLET(10 MG) BY MOUTH DAILY (Patient taking differently: Take 10 mg by mouth daily.) 90 tablet 2  ? FLUoxetine (PROZAC) 40 MG capsule TAKE 1 CAPSULE(40 MG) BY MOUTH DAILY (Patient taking differently: Take 40 mg by mouth daily. TAKE 1 CAPSULE(40 MG) BY MOUTH DAILY) 90 capsule 0  ? glucose blood test strip Use as instructed: One touch Ultra E11.9 100 each 12  ? insulin lispro (HUMALOG KWIKPEN) 100 UNIT/ML KwikPen Inject 6 Units into the skin 3 (three) times daily. 15 mL 11  ? lidocaine (LIDODERM) 5 % Place 2 patches onto the skin daily. Suggest neck/shoulders- Remove & Discard patch within 12 hours or as directed by MD (Patient taking differently: Place 2 patches onto the skin daily as needed (pain). Suggest neck/shoulders- Remove & Discard patch within 12 hours or as directed by MD) 60 patch 5  ? losartan (COZAAR) 50 MG tablet Take 1 tablet (50 mg total) by mouth daily. 90 tablet 0  ? metFORMIN (GLUCOPHAGE-XR) 500 MG 24 hr tablet Take 1 tablet (500 mg total) by mouth daily with breakfast. 30 tablet 3  ? pantoprazole (PROTONIX) 40 MG tablet Take 1 tablet (40 mg total) by mouth daily. 90 tablet 1  ? traMADol (ULTRAM) 50 MG tablet TAKE 1 TABLET(50 MG) BY MOUTH EVERY 8 HOURS FOR UP TO 5 DAYS AS NEEDED 30 tablet 0  ? TRESIBA FLEXTOUCH 200 UNIT/ML FlexTouch Pen SMARTSIG:50 Unit(s) SUB-Q Twice Daily    ? zolpidem (AMBIEN) 10 MG tablet TAKE 1 TABLET BY MOUTH AT BEDTIME AS NEEDED FOR INSOMNIA 30  tablet 2  ? ?No current facility-administered medications on file prior to visit.  ? ? ?BP (!) 150/90   Pulse 85   Temp 98 ?F (36.7 ?C)   Resp 18   Ht 5\' 1"  (1.549 m)   Wt 198 lb 3.2 oz (89.9 kg)   SpO2 97%   BMI 37.45 kg/m

## 2021-11-23 NOTE — Patient Instructions (Addendum)
Sugar now better controlled since seeing endocrinologist and using tresiba. Continue novolog as well.  Obesity- weight gain recently as started insulin. Placed wt loss/management clinic referral.  Major depression- continue prozac 40 mg daily.  Fibromyalgia and myofascial pain syndrome- severe at times. Excacerbations occurs with minimal physical activity. So severe in the past caused disability since 2014.  Will fill out paperwork/disability based on information given today. Will try to get completed by 2 weeks.   If disability has questions on form can provide copy of this note.  Follow up in 3 months or sooner if needed.  Filled out disability form today. Will get MA to send form in tomorrow 11-27-2021.

## 2021-11-29 ENCOUNTER — Other Ambulatory Visit: Payer: Self-pay | Admitting: Medical

## 2021-11-29 DIAGNOSIS — F339 Major depressive disorder, recurrent, unspecified: Secondary | ICD-10-CM

## 2021-12-02 ENCOUNTER — Other Ambulatory Visit: Payer: Self-pay | Admitting: Medical

## 2021-12-02 DIAGNOSIS — F339 Major depressive disorder, recurrent, unspecified: Secondary | ICD-10-CM

## 2021-12-08 ENCOUNTER — Other Ambulatory Visit: Payer: Self-pay | Admitting: Medical

## 2021-12-08 NOTE — Telephone Encounter (Signed)
Can you check on this for me?  

## 2021-12-09 NOTE — Telephone Encounter (Signed)
Requesting: tramadol Contract:09/21/21 UDS:09/21/21 Last Visit:11/23/21 Next Visit:n/a Last Refill:10/19/21  Please Advise   Rx refill sent to pt pharmacy  Esperanza Richters, PA-C

## 2021-12-18 ENCOUNTER — Other Ambulatory Visit: Payer: Self-pay | Admitting: Medical

## 2021-12-22 ENCOUNTER — Other Ambulatory Visit: Payer: Self-pay | Admitting: Medical

## 2021-12-22 ENCOUNTER — Encounter: Payer: Self-pay | Admitting: Medical

## 2022-01-14 DIAGNOSIS — Z0289 Encounter for other administrative examinations: Secondary | ICD-10-CM

## 2022-01-20 ENCOUNTER — Encounter (INDEPENDENT_AMBULATORY_CARE_PROVIDER_SITE_OTHER): Payer: Self-pay | Admitting: Family Medicine

## 2022-01-20 ENCOUNTER — Ambulatory Visit (INDEPENDENT_AMBULATORY_CARE_PROVIDER_SITE_OTHER): Payer: Medicare Other | Admitting: Family Medicine

## 2022-01-20 VITALS — BP 106/71 | HR 80 | Temp 98.3°F | Ht 62.0 in | Wt 199.0 lb

## 2022-01-20 DIAGNOSIS — Z7984 Long term (current) use of oral hypoglycemic drugs: Secondary | ICD-10-CM

## 2022-01-20 DIAGNOSIS — E669 Obesity, unspecified: Secondary | ICD-10-CM

## 2022-01-20 DIAGNOSIS — Z794 Long term (current) use of insulin: Secondary | ICD-10-CM

## 2022-01-20 DIAGNOSIS — E119 Type 2 diabetes mellitus without complications: Secondary | ICD-10-CM | POA: Diagnosis not present

## 2022-01-20 DIAGNOSIS — I10 Essential (primary) hypertension: Secondary | ICD-10-CM

## 2022-01-20 DIAGNOSIS — F3289 Other specified depressive episodes: Secondary | ICD-10-CM | POA: Diagnosis not present

## 2022-01-20 DIAGNOSIS — R0602 Shortness of breath: Secondary | ICD-10-CM | POA: Diagnosis not present

## 2022-01-20 DIAGNOSIS — E7849 Other hyperlipidemia: Secondary | ICD-10-CM | POA: Insufficient documentation

## 2022-01-20 DIAGNOSIS — R5383 Other fatigue: Secondary | ICD-10-CM

## 2022-01-20 DIAGNOSIS — F32A Depression, unspecified: Secondary | ICD-10-CM | POA: Insufficient documentation

## 2022-01-20 DIAGNOSIS — E559 Vitamin D deficiency, unspecified: Secondary | ICD-10-CM

## 2022-01-20 DIAGNOSIS — Z6836 Body mass index (BMI) 36.0-36.9, adult: Secondary | ICD-10-CM

## 2022-01-20 HISTORY — DX: Other fatigue: R53.83

## 2022-01-20 HISTORY — DX: Other hyperlipidemia: E78.49

## 2022-01-21 LAB — HEMOGLOBIN A1C
Est. average glucose Bld gHb Est-mCnc: 157 mg/dL
Hgb A1c MFr Bld: 7.1 % — ABNORMAL HIGH (ref 4.8–5.6)

## 2022-01-21 LAB — LIPID PANEL
Chol/HDL Ratio: 5.4 ratio — ABNORMAL HIGH (ref 0.0–4.4)
Cholesterol, Total: 238 mg/dL — ABNORMAL HIGH (ref 100–199)
HDL: 44 mg/dL (ref >39)
LDL Chol Calc (NIH): 158 mg/dL — ABNORMAL HIGH (ref 0–99)
Triglycerides: 194 mg/dL — ABNORMAL HIGH (ref 0–149)
VLDL Cholesterol Cal: 36 mg/dL (ref 5–40)

## 2022-01-21 LAB — INSULIN, RANDOM: INSULIN: 6.6 u[IU]/mL (ref 2.6–24.9)

## 2022-01-26 ENCOUNTER — Encounter: Payer: Self-pay | Admitting: Medical

## 2022-01-26 MED ORDER — ALBUTEROL SULFATE HFA 108 (90 BASE) MCG/ACT IN AERS: 2.0000 | INHALATION_SPRAY | Freq: Four times a day (QID) | RESPIRATORY_TRACT | 2 refills | Status: DC | PRN

## 2022-01-26 NOTE — Addendum Note (Signed)
Addended by: Gwenevere Abbot on: 01/26/2022 07:57 PM   Modules accepted: Orders

## 2022-01-28 ENCOUNTER — Other Ambulatory Visit: Payer: Self-pay | Admitting: Medical

## 2022-01-28 DIAGNOSIS — F5101 Primary insomnia: Secondary | ICD-10-CM

## 2022-01-28 NOTE — Telephone Encounter (Addendum)
Requesting:ambien 10 mg Contract:09/21/21 UDS:09/21/21 Last Visit:11/23/21 Next Visit:02/22/22 Last Refill:10/28/21  Please Advise   Rx refill sent.  Esperanza Richters, PA-C

## 2022-02-02 NOTE — Progress Notes (Addendum)
Chief Complaint:   OBESITY Megan Murray (MR# [id number]) is a 56 y.o. female who presents for evaluation and treatment of obesity and related comorbidities. Current BMI is Body mass index is 36.4 kg/m. Megan Murray has been struggling with her weight for many years and has been unsuccessful in either losing weight, maintaining weight loss, or reaching her healthy weight goal.  Megan Murray maintained adult weight at 180 pounds until March 2023.  Started insulin for diabetes type 2.  Does not consume starches or sugars.  Frustrated by weight gain.  Exercise limited by fibromyalgia but can walk 2-3 times a week.  Lacks sleep at night.  Notes a family history of obesity and diabetes.  No previous antiobesity medications.  Monitors her glucose with CBG meter.  Megan Murray is currently in the action stage of change and ready to dedicate time achieving and maintaining a healthier weight. Megan Murray is interested in becoming our patient and working on intensive lifestyle modifications including (but not limited to) diet and exercise for weight loss.  Megan Murray's habits were reviewed today and are as follows: Her family eats meals together, she thinks her family will eat healthier with her, her desired weight loss is 64 lbs, she started gaining weight at 22, her heaviest weight ever was 200 pounds, she has significant food cravings issues, she frequently eats larger portions than normal, and she struggles with emotional eating.  Depression Screen Megan Murray's Food and Mood (modified PHQ-9) score was 11.     01/20/2022   11:19 AM  Depression screen PHQ 2/9  Decreased Interest 3  Down, Depressed, Hopeless 3  PHQ - 2 Score 6  Altered sleeping 0  Tired, decreased energy 2  Change in appetite 1  Feeling bad or failure about yourself  1  Trouble concentrating 1  Moving slowly or fidgety/restless 0  Suicidal thoughts 0  PHQ-9 Score 11  Difficult doing work/chores Very difficult   Subjective:   1. Other  fatigue Megan Murray admits to daytime somnolence and admits to waking up still tired. Patient has a history of symptoms of daytime fatigue and morning fatigue. Megan Murray generally gets 6 hours of sleep per night, and states that she has nightime awakenings. Snoring is not present. Apneic episodes are not present. Epworth Sleepiness Score is 6.   2. SOBOE (shortness of breath on exertion) Jacki Cones notes increasing shortness of breath with exercising and seems to be worsening over time with weight gain. She notes getting out of breath sooner with activity than she used to. This has not gotten worse recently. Senie denies shortness of breath at rest or orthopnea.  3. Type 2 diabetes mellitus without complication, with Megan Murray current use of insulin (HCC) Megan Murray is on Tresiba, metformin, and NovoLog per Dr. Talmage Murray.  Last A1c was 10.5.  Has a history of diabetes for 7 years.  Had pancreatitis on Ozempic and Synjardy.  4. Essential hypertension Megan Murray is on losartan 50 mg once daily.  Her blood pressure is well controlled and she denies chest pain.  5. Other hyperlipidemia Megan Murray is on Zetia 10 mg daily.  She has a history of statin intolerance.  She is seeing cardiac pharmacist.  6. Vitamin D deficiency Megan Murray is currently not on vitamin D supplementation.  7. Other depression, with emotional eating Megan Murray's PHQ-9 score is 11.  She is on Prozac 40 mg daily.  She chooses salty/crunchy foods when stressed.  Assessment/Plan:   1. Other fatigue Megan Murray does feel that her weight is causing her energy  to be lower than it should be. Fatigue may be related to obesity, depression or many other causes. Labs will be ordered, and in the meanwhile, [name] will focus on self care including making healthy food choices, increasing physical activity and focusing on stress reduction.  2. SOBOE (shortness of breath on exertion) Megan Murray does feel that she gets out of breath more easily that she used to when she exercises.  Megan Murray's shortness of breath appears to be obesity related and exercise induced. She has agreed to work on weight loss and gradually increase exercise to treat her exercise induced shortness of breath. Will continue to monitor closely.  3. Type 2 diabetes mellitus without complication, with Megan Murray current use of insulin (HCC) We will check labs today. Megan Murray will continue closely monitoring her blood sugars.  Low carbohydrate, high-fiber, lean protein diet eating 4 times a day.  - Hemoglobin A1c - Insulin, random - Lipid panel  4. Essential hypertension Glennis will continue losartan 50 mg once daily.  5. Other hyperlipidemia Megan Murray will continue Zetia, and will work on healthy dietary changes.  6. Vitamin D deficiency We will check labs today, and we will follow-up at Megan Murray's next office visit.  - VITAMIN D 25 Hydroxy (Vit-D Deficiency, Fractures)  7. Other depression, with emotional eating We will consider use of Wellbutrin, and cognitive behavioral therapy if needed.   8. Obesity, current BMI 36.4 Megan Murray is currently in the action stage of change and her goal is to continue with weight loss efforts. I recommend Megan Murray begin the structured treatment plan as follows:  She has agreed to the Category 2 Plan, or keeping a food journal and adhering to recommended goals of 1200-1300 calories and 80 grams of protein daily, or following a lower carbohydrate, vegetable and lean protein rich diet plan.  Log intake of 1200 to 1300 cal/day and 80 g of protein target.  Category 2 but modified to decrease carbohydrate intake.  EKG review from 09/28/2021.  TSH on 10/05/2021 1.1, B12 343, CMP.  Exercise goals: Recommend water exercise.  Behavioral modification strategies: increasing lean protein intake, increasing water intake, no skipping meals, and keeping a strict food journal.  She was informed of the importance of frequent follow-up visits to maximize her success with intensive lifestyle  modifications for her multiple health conditions. She was informed we would discuss her lab results at her next visit unless there is a critical issue that needs to be addressed sooner. Cheng agreed to keep her next visit at the agreed upon time to discuss these results.  Objective:   Blood pressure 106/71, pulse 80, temperature 98.3 F (36.8 C), height 5\' 2"  (1.575 m), weight 199 lb (90.3 kg), SpO2 97 %. Body mass index is 36.4 kg/m.  EKG: Normal sinus rhythm, rate 75 BPM.  Indirect Calorimeter completed today shows a VO2 of 271 and a REE of 1872.  Her calculated basal metabolic rate is [id number] thus her basal metabolic rate is better than expected.  General: Cooperative, alert, well developed, in no acute distress. HEENT: Conjunctivae and lids unremarkable. Cardiovascular: Regular rhythm.  Lungs: Normal work of breathing. Neurologic: No focal deficits.   Lab Results  Component Value Date   CREATININE 0.38 (L) 10/05/2021   BUN 10 10/05/2021   NA 136 10/05/2021   K 4.1 10/05/2021   CL 102 10/05/2021   CO2 25 10/05/2021   Lab Results  Component Value Date   ALT 25 10/05/2021   AST 18 10/05/2021   ALKPHOS 62 10/05/2021  BILITOT 0.5 10/05/2021   Lab Results  Component Value Date   HGBA1C 7.1 (H) 01/20/2022   HGBA1C 10.6 (H) 09/21/2021   HGBA1C 7.2 (H) 05/06/2021   HGBA1C 7.4 (H) 02/02/2021   HGBA1C 6.7 (H) 03/05/2020   Lab Results  Component Value Date   INSULIN 6.6 01/20/2022   Lab Results  Component Value Date   TSH 1.10 10/05/2021   Lab Results  Component Value Date   CHOL 238 (H) 01/20/2022   HDL 44 01/20/2022   LDLCALC 158 (H) 01/20/2022   LDLDIRECT 105.0 09/21/2021   TRIG 194 (H) 01/20/2022   CHOLHDL 5.4 (H) 01/20/2022   Lab Results  Component Value Date   WBC 5.5 10/05/2021   HGB 14.0 10/05/2021   HCT 41.9 10/05/2021   MCV 91.0 10/05/2021   PLT 195.0 10/05/2021   Lab Results  Component Value Date   IRON 70 10/05/2021   TIBC 354 03/06/2021    FERRITIN 131 03/06/2021   Attestation Statements:   Reviewed by clinician on day of visit: allergies, medications, problem list, medical history, surgical history, family history, social history, and previous encounter notes.   Trude Mcburney, am acting as transcriptionist for Seymour Bars, DO.  I have reviewed the above documentation for accuracy and completeness, and I agree with the above. Glennis Brink, DO

## 2022-02-03 ENCOUNTER — Ambulatory Visit (INDEPENDENT_AMBULATORY_CARE_PROVIDER_SITE_OTHER): Payer: Medicare Other | Admitting: Family Medicine

## 2022-02-03 ENCOUNTER — Encounter (INDEPENDENT_AMBULATORY_CARE_PROVIDER_SITE_OTHER): Payer: Self-pay | Admitting: Family Medicine

## 2022-02-03 VITALS — BP 134/76 | HR 67 | Temp 98.2°F | Ht 62.0 in | Wt 202.0 lb

## 2022-02-03 DIAGNOSIS — F5101 Primary insomnia: Secondary | ICD-10-CM | POA: Diagnosis not present

## 2022-02-03 DIAGNOSIS — Z6837 Body mass index (BMI) 37.0-37.9, adult: Secondary | ICD-10-CM

## 2022-02-03 DIAGNOSIS — E7849 Other hyperlipidemia: Secondary | ICD-10-CM

## 2022-02-03 DIAGNOSIS — E119 Type 2 diabetes mellitus without complications: Secondary | ICD-10-CM

## 2022-02-03 DIAGNOSIS — M797 Fibromyalgia: Secondary | ICD-10-CM

## 2022-02-03 DIAGNOSIS — E669 Obesity, unspecified: Secondary | ICD-10-CM

## 2022-02-03 DIAGNOSIS — E559 Vitamin D deficiency, unspecified: Secondary | ICD-10-CM | POA: Diagnosis not present

## 2022-02-03 DIAGNOSIS — Z794 Long term (current) use of insulin: Secondary | ICD-10-CM

## 2022-02-12 ENCOUNTER — Emergency Department (HOSPITAL_BASED_OUTPATIENT_CLINIC_OR_DEPARTMENT_OTHER)
Admission: EM | Admit: 2022-02-12 | Discharge: 2022-02-12 | Disposition: A | Discharge status: Home / Self Care | Payer: Medicare Other | Attending: Emergency Medicine | Admitting: Emergency Medicine

## 2022-02-12 ENCOUNTER — Encounter (HOSPITAL_BASED_OUTPATIENT_CLINIC_OR_DEPARTMENT_OTHER): Payer: Self-pay | Admitting: Pharmacist Clinician (PhC)/ Clinical Pharmacy Specialist

## 2022-02-12 ENCOUNTER — Other Ambulatory Visit: Payer: Self-pay

## 2022-02-12 ENCOUNTER — Ambulatory Visit (HOSPITAL_BASED_OUTPATIENT_CLINIC_OR_DEPARTMENT_OTHER): Payer: Medicare Other | Admitting: Pharmacist Clinician (PhC)/ Clinical Pharmacy Specialist

## 2022-02-12 ENCOUNTER — Emergency Department (HOSPITAL_BASED_OUTPATIENT_CLINIC_OR_DEPARTMENT_OTHER): Payer: Medicare Other | Admitting: Radiology

## 2022-02-12 ENCOUNTER — Encounter (HOSPITAL_BASED_OUTPATIENT_CLINIC_OR_DEPARTMENT_OTHER): Payer: Self-pay | Admitting: Emergency Medicine

## 2022-02-12 VITALS — Ht 62.0 in | Wt 205.6 lb

## 2022-02-12 DIAGNOSIS — Z1389 Encounter for screening for other disorder: Secondary | ICD-10-CM | POA: Diagnosis not present

## 2022-02-12 DIAGNOSIS — R0789 Other chest pain: Secondary | ICD-10-CM | POA: Diagnosis not present

## 2022-02-12 DIAGNOSIS — Z7982 Long term (current) use of aspirin: Secondary | ICD-10-CM | POA: Diagnosis not present

## 2022-02-12 DIAGNOSIS — E1165 Type 2 diabetes mellitus with hyperglycemia: Secondary | ICD-10-CM | POA: Insufficient documentation

## 2022-02-12 DIAGNOSIS — E782 Mixed hyperlipidemia: Secondary | ICD-10-CM | POA: Diagnosis not present

## 2022-02-12 DIAGNOSIS — I10 Essential (primary) hypertension: Secondary | ICD-10-CM | POA: Diagnosis not present

## 2022-02-12 DIAGNOSIS — Z794 Long term (current) use of insulin: Secondary | ICD-10-CM | POA: Diagnosis not present

## 2022-02-12 DIAGNOSIS — Z79899 Other long term (current) drug therapy: Secondary | ICD-10-CM | POA: Insufficient documentation

## 2022-02-12 DIAGNOSIS — E781 Pure hyperglyceridemia: Secondary | ICD-10-CM | POA: Diagnosis not present

## 2022-02-12 DIAGNOSIS — I2510 Atherosclerotic heart disease of native coronary artery without angina pectoris: Secondary | ICD-10-CM | POA: Insufficient documentation

## 2022-02-12 LAB — CBC
HCT: 43.9 % (ref 36.0–46.0)
Hemoglobin: 14.9 g/dL (ref 12.0–15.0)
MCH: 29.5 pg (ref 26.0–34.0)
MCHC: 33.9 g/dL (ref 30.0–36.0)
MCV: 86.9 fL (ref 80.0–100.0)
Platelets: 223 10*3/uL (ref 150–400)
RBC: 5.05 MIL/uL (ref 3.87–5.11)
RDW: 13.2 % (ref 11.5–15.5)
WBC: 8.8 10*3/uL (ref 4.0–10.5)
nRBC: 0 % (ref 0.0–0.2)

## 2022-02-12 LAB — BASIC METABOLIC PANEL
Anion gap: 11 (ref 5–15)
BUN: 15 mg/dL (ref 6–20)
CO2: 22 mmol/L (ref 22–32)
Calcium: 9 mg/dL (ref 8.9–10.3)
Chloride: 102 mmol/L (ref 98–111)
Creatinine, Ser: 0.43 mg/dL — ABNORMAL LOW (ref 0.44–1.00)
GFR, Estimated: >60 mL/min (ref >60)
Glucose, Bld: 243 mg/dL — ABNORMAL HIGH (ref 70–99)
Potassium: 4.1 mmol/L (ref 3.5–5.1)
Sodium: 135 mmol/L (ref 135–145)

## 2022-02-12 LAB — TROPONIN I (HIGH SENSITIVITY)
Troponin I (High Sensitivity): 2 ng/L (ref <18)
Troponin I (High Sensitivity): <2 ng/L (ref <18)

## 2022-02-12 LAB — PREGNANCY, URINE: Preg Test, Ur: NEGATIVE

## 2022-02-12 LAB — D-DIMER, QUANTITATIVE: D-Dimer, Quant: 0.42 ug/mL-FEU (ref 0.00–0.50)

## 2022-02-12 MED ORDER — IPRATROPIUM-ALBUTEROL 0.5-2.5 (3) MG/3ML IN SOLN: 3.0000 mL | Freq: Once | RESPIRATORY_TRACT | Status: AC

## 2022-02-12 MED ORDER — ALBUTEROL SULFATE (2.5 MG/3ML) 0.083% IN NEBU
INHALATION_SOLUTION | RESPIRATORY_TRACT | Status: AC
  Administered 2022-02-12: 2.5 mg via RESPIRATORY_TRACT
  Filled 2022-02-12: qty 3

## 2022-02-12 MED ORDER — IPRATROPIUM-ALBUTEROL 0.5-2.5 (3) MG/3ML IN SOLN
RESPIRATORY_TRACT | Status: AC
  Administered 2022-02-12: 3 mL via RESPIRATORY_TRACT
  Filled 2022-02-12: qty 3

## 2022-02-12 MED ORDER — ALBUTEROL SULFATE (2.5 MG/3ML) 0.083% IN NEBU: 2.5000 mg | INHALATION_SOLUTION | Freq: Once | RESPIRATORY_TRACT | Status: AC

## 2022-02-12 MED ORDER — ASPIRIN 81 MG PO CHEW
324.0000 mg | CHEWABLE_TABLET | Freq: Once | ORAL | Status: AC
  Administered 2022-02-12: 324 mg via ORAL
  Filled 2022-02-12: qty 4

## 2022-02-12 NOTE — Discharge Instructions (Signed)
Keep your appointment with the pharmacist later today.  Apply ice for 30 minutes at a time, 4 times a day.  Take either ibuprofen or naproxen as needed for pain.  If you need additional pain relief, add acetaminophen.  Please be aware that when you combine acetaminophen with ibuprofen or naproxen, you get better pain relief and you get from taking either medication by itself.  Return if you have any new or concerning symptoms.

## 2022-02-12 NOTE — ED Notes (Signed)
Pt agreeable with d/c plan as discussed by provider- this nurse has verbally reinforced d/c instructions and provided pt with written copy - pt acknowledges verbal understanding and denies any additional questions, concerns, needs- pt ambulatory independently at d/c with steady gait - no distress; vitals stable.  

## 2022-02-12 NOTE — Patient Instructions (Signed)
Your Results:             Your most recent labs Goal  Total Cholesterol 238 < 200  Triglycerides 194 < 150  HDL (happy/good cholesterol) 44 > 40  LDL (lousy/bad cholesterol 158 < 55   Medication changes:  We will start the process to get Nexletol covered by your insurance.  Once the prior authorization is complete, I will call you to let you know and confirm pharmacy information.   You will take 1 tablet daily.  If this works we will combine Nexletol and ezetimibe --> Nexlizet  Lab orders:  We want to repeat labs after 2-3 months.  We will send you a lab order to remind you once we get closer to that time.      Thank you for choosing [facility]

## 2022-02-12 NOTE — ED Provider Notes (Signed)
[facility] EMERGENCY DEPT Provider Note   CSN: [id number] Arrival date & time: 02/12/22  0111     History  Chief complaint: Chest pain  Megan Murray is a 56 y.o. female.  The history is provided by the patient.  She has history of hypertension, diabetes, hyperlipidemia, coronary artery disease and comes in complaining of left-sided chest pain.  She had pain 3 days ago which was intermittent, and then resolved until today when she had recurrence of pain.  Pain is described as a pressure feeling without radiation.  There is no associated dyspnea, nausea, diaphoresis.  It is worse when she takes a deep breath.  She has not taken anything for it.  She denies any recent trauma or unusual activity.  She denies any history of recent travel.  She denies history of prior DVT or pulmonary embolism.  She is a non-smoker.  There is a positive family history of premature coronary atherosclerosis with a brother having had bypass surgery at age 67.   Home Medications Prior to Admission medications   Medication Sig Start Date End Date Taking? Authorizing Provider  albuterol (VENTOLIN HFA) 108 (90 Base) MCG/ACT inhaler Inhale 2 puffs into the lungs every 6 (six) hours as needed for wheezing or shortness of breath. 01/26/22   Saguier, Ramon Dredge, PA-C  aspirin EC 81 MG tablet Take 1 tablet (81 mg total) by mouth daily. Swallow whole. 02/20/21   Georgeanna Lea, MD  Continuous Blood Gluc Receiver (FREESTYLE LIBRE 2 READER) DEVI Use as directed 10/27/21   Saguier, Ramon Dredge, PA-C  Continuous Blood Gluc Sensor (FREESTYLE LIBRE 2 SENSOR) MISC Use as directed 10/27/21   Saguier, Ramon Dredge, PA-C  ezetimibe (ZETIA) 10 MG tablet TAKE 1 TABLET(10 MG) BY MOUTH DAILY Patient taking differently: Take 10 mg by mouth daily. 05/25/21   Saguier, Ramon Dredge, PA-C  FLUoxetine (PROZAC) 40 MG capsule TAKE 1 CAPSULE(40 MG) BY MOUTH DAILY 12/02/21   Saguier, Ramon Dredge, PA-C  fluticasone furoate-vilanterol (BREO ELLIPTA) 200-25  MCG/ACT AEPB Inhale 1 puff into the lungs daily.    [provider]  glucose blood test strip Use as instructed: One touch Ultra E11.9 10/06/21   Saguier, Ramon Dredge, PA-C  ibuprofen (ADVIL) 600 MG tablet Take 1 tablet by mouth every 6 (six) hours as needed.    [provider]  insulin lispro (HUMALOG KWIKPEN) 100 UNIT/ML KwikPen Inject 6 Units into the skin 3 (three) times daily. 10/28/21   Saguier, Ramon Dredge, PA-C  losartan (COZAAR) 50 MG tablet TAKE 1 TABLET(50 MG) BY MOUTH DAILY 12/22/21   Saguier, Ramon Dredge, PA-C  pantoprazole (PROTONIX) 40 MG tablet Take 1 tablet (40 mg total) by mouth daily. 12/22/21   Saguier, Ramon Dredge, PA-C  traMADol (ULTRAM) 50 MG tablet TAKE 1 TABLET(50 MG) BY MOUTH EVERY 8 HOURS FOR UP TO 5 DAYS AS NEEDED 12/10/21   Saguier, Ramon Dredge, PA-C  TRESIBA FLEXTOUCH 200 UNIT/ML FlexTouch Pen SMARTSIG:50 Unit(s) SUB-Q Twice Daily 11/10/21   [provider]  zolpidem (AMBIEN) 10 MG tablet TAKE 1 TABLET BY MOUTH AT BEDTIME AS NEEDED FOR INSOMNIA 01/28/22   Saguier, Ramon Dredge, PA-C      Allergies    Codeine, Atorvastatin, Cholestyramine, Crestor [rosuvastatin calcium], Fenofibrate, Simvastatin, Soy allergy, Statins, Dulaglutide, Empagliflozin-metformin hcl, Ozempic (0.25 or 0.5 mg-dose) [semaglutide(0.25 or 0.5mg -dos)], Repatha [evolocumab], and Meloxicam    Review of Systems   Review of Systems  All other systems reviewed and are negative.   Physical Exam Updated Vital Signs BP (!) 149/64 (BP Location: Left Arm)  Pulse 84   Temp 99.1 F (37.3 C) (Oral)   Resp 18   SpO2 100%  Physical Exam Vitals and nursing note reviewed.   56 year old female, resting comfortably and in no acute distress. Vital signs are significant for elevated blood pressure. Oxygen saturation is 100%, which is normal. Head is normocephalic and atraumatic. PERRLA, EOMI. Oropharynx is clear. Neck is nontender and supple without adenopathy or JVD. Back is nontender and there is no CVA  tenderness. Lungs are clear without rales, wheezes, or rhonchi. Chest is mildly tender in the left parasternal area inferiorly.  There is no crepitus. Heart has regular rate and rhythm without murmur. Abdomen is soft, flat, nontender. Extremities have no cyanosis or edema, full range of motion is present. Skin is warm and dry without rash. Neurologic: Mental status is normal, cranial nerves are intact, moves all extremities equally.  ED Results / Procedures / Treatments   Labs (all labs ordered are listed, but only abnormal results are displayed) Labs Reviewed  BASIC METABOLIC PANEL - Abnormal; Notable for the following components:      Result Value   Glucose, Bld 243 (*)    Creatinine, Ser 0.43 (*)    All other components within normal limits  CBC  PREGNANCY, URINE  D-DIMER, QUANTITATIVE  TROPONIN I (HIGH SENSITIVITY)  TROPONIN I (HIGH SENSITIVITY)    EKG EKG Interpretation  Date/Time:  Friday February 12 2022 01:36:26 EDT Ventricular Rate:  76 PR Interval:  124 QRS Duration: 94 QT Interval:  406 QTC Calculation: 456 R Axis:   50 Text Interpretation: Normal sinus rhythm Normal ECG When compared with ECG of 28-Sep-2021 06:27, Criteria for Inferior infarct are no longer Present Confirmed by Dione Booze ([id number]) on 02/12/2022 1:47:53 AM  Radiology DG Chest 2 View  Result Date: 02/12/2022 CLINICAL DATA:  Chest pain EXAM: CHEST - 2 VIEW COMPARISON:  09/27/2021 FINDINGS: The heart size and mediastinal contours are within normal limits. Both lungs are clear. The visualized skeletal structures are unremarkable. IMPRESSION: No active cardiopulmonary disease. Electronically Signed   By: Alcide Clever M.D.   On: 02/12/2022 02:07    Procedures Procedures  Cardiac monitor shows normal sinus rhythm, per my interpretation.  Medications Ordered in ED Medications  ipratropium-albuterol (DUONEB) 0.5-2.5 (3) MG/3ML nebulizer solution 3 mL (3 mLs Nebulization Given 02/12/22 0139)  albuterol  (PROVENTIL) (2.5 MG/3ML) 0.083% nebulizer solution 2.5 mg (2.5 mg Nebulization Given 02/12/22 0139)    ED Course/ Medical Decision Making/ A&P                           Medical Decision Making Amount and/or Complexity of Data Reviewed Labs: ordered. Radiology: ordered.  Risk OTC drugs. Prescription drug management.   Chest pain which seems most likely to be musculoskeletal pain.  However, patient does have significant risk factors for coronary artery disease and does have known coronary artery disease per coronary CT scan.  Other diagnostic possibilities include pulmonary embolism, pneumothorax, pneumonia, pericarditis.  I have reviewed and interpreted her ECG, and my interpretation is normal ECG unchanged from prior.  Chest x-ray shows no active cardiopulmonary disease.  I have independently viewed the images, and agree with the radiologist interpretation.  I have reviewed and interpreted her laboratory tests, and my interpretation is mildly elevated glucose consistent with history of diabetes, normal troponin x2, normal CBC.  I have ordered a D-dimer to rule out pulmonary embolism.  Her heart score is 3, which  gives her a low risk of major adverse cardiac events over the next 30 days.  Old records are reviewed, and coronary CT scan on 03/10/2021 showed a 50-70% lesion of the left circumflex artery, 25-49% lesion of the posterior descending artery, minimal other coronary artery disease.  Echocardiogram on 03/11/2021 showed normal ejection fraction and grade 1 diastolic dysfunction.  I have ordered a dose of aspirin.  D-dimer has come back normal.  Patient does not have pulmonary embolism.  She is felt to be safe for discharge at this point.  I have recommended that she use ice, over-the-counter NSAIDs, and acetaminophen.  Follow-up with PCP.  Also, she has an appointment with her cardiology pharmacist later today, she is to keep that appointment.  Final Clinical Impression(s) / ED Diagnoses Final  diagnoses:  Chest wall pain    Rx / DC Orders ED Discharge Orders     None         Dione Booze, MD 02/12/22 [phone]

## 2022-02-12 NOTE — Progress Notes (Signed)
02/12/2022 Mckensi Redinger Diosdado 1965-08-17 [id number]   HPI:  GENE COLEE is a 56 y.o. female patient of Dr Bing Matter, who presents today for a lipid clinic evaluation.  See pertinent past medical history below.  I saw Ms Raczka late last year to discuss options for cholesterol lowering, as she had been intolerant to both atorvastatin and rosuvastatin.  At that time she chose to try Repatha 140 mg dose.  She took only one dose, but developed severe flu-like symptoms and was not willing to try a second dose.  Since then she was hospitalized in March of this year for pancreatitis and DKA.  Her diabetes medications were discontinued and she has been on an insulin only regimen since then.  A1c has dropped from 10.6 to 7.1.  Unfortunately through this she has gained over 20 pounds.  She has [facility] with [facility] an is considering gastric bypass surgery.  Early this morning (1 am) she came to the [facility] ED because of chest pain.  It was ruled to be musculoskeletal and she was encouraged to use ice, NSAID's and acetaminophen.    Past Medical History: CAD Coronary calcium score at 94th percentile; CAD with RCA 25-50% stenosed, circumflex 50-70% stenosed   HLD  Zetia 10 mg    HTN   Losartan 25 mg daily   T2DM  Trulicity 1.5 mg weekly    Fibromyalgia   Diclofenac 75 mg BID, Diclofenac gel 2g QID, Tramadol 50 mg prn pain    Current Medications: ezetimibe 10 mg daily    Cholesterol Goals:  LDL <55,    Intolerant/previously tried: atorvastatin, rosuvastatin - myalgias   Evolocumab - severe flu-like symptoms   Family history: strong history of cirrhosis in paternal family - father, uncle, grandfather and great-grandfather; brother with MI, DM2, mother with heart failure, hypertension  Exercise:  limited secondary to fibromyalgia  Labs:  7/23:  TC 138, TG 194, HDL 44, LDL 158 (on ezetimibe 10 mg)   Current Outpatient Medications  Medication Sig Dispense Refill    albuterol (VENTOLIN HFA) 108 (90 Base) MCG/ACT inhaler Inhale 2 puffs into the lungs every 6 (six) hours as needed for wheezing or shortness of breath. 6.7 g 2   aspirin EC 81 MG tablet Take 1 tablet (81 mg total) by mouth daily. Swallow whole. 90 tablet 3   Continuous Blood Gluc Receiver (FREESTYLE LIBRE 2 READER) DEVI Use as directed 1 each 0   Continuous Blood Gluc Sensor (FREESTYLE LIBRE 2 SENSOR) MISC Use as directed 2 each 5   ezetimibe (ZETIA) 10 MG tablet TAKE 1 TABLET(10 MG) BY MOUTH DAILY (Patient taking differently: Take 10 mg by mouth daily.) 90 tablet 2   FLUoxetine (PROZAC) 40 MG capsule TAKE 1 CAPSULE(40 MG) BY MOUTH DAILY 90 capsule 0   fluticasone furoate-vilanterol (BREO ELLIPTA) 200-25 MCG/ACT AEPB Inhale 1 puff into the lungs daily.     glucose blood test strip Use as instructed: One touch Ultra E11.9 100 each 12   ibuprofen (ADVIL) 600 MG tablet Take 1 tablet by mouth every 6 (six) hours as needed.     insulin lispro (HUMALOG KWIKPEN) 100 UNIT/ML KwikPen Inject 6 Units into the skin 3 (three) times daily. 15 mL 11   losartan (COZAAR) 50 MG tablet TAKE 1 TABLET(50 MG) BY MOUTH DAILY 90 tablet 0   pantoprazole (PROTONIX) 40 MG tablet Take 1 tablet (40 mg total) by mouth daily. 90 tablet 0   traMADol (ULTRAM) 50  MG tablet TAKE 1 TABLET(50 MG) BY MOUTH EVERY 8 HOURS FOR UP TO 5 DAYS AS NEEDED 30 tablet 1   TRESIBA FLEXTOUCH 200 UNIT/ML FlexTouch Pen SMARTSIG:50 Unit(s) SUB-Q Twice Daily     zolpidem (AMBIEN) 10 MG tablet TAKE 1 TABLET BY MOUTH AT BEDTIME AS NEEDED FOR INSOMNIA 30 tablet 0   No current facility-administered medications for this visit.    Allergies  Allergen Reactions   Codeine Palpitations    Natural or Synthetic Codeine   Atorvastatin Nausea Only and Other (See Comments)    Myalgias   Cholestyramine Diarrhea and Nausea Only   Crestor [Rosuvastatin Calcium] Nausea Only and Other (See Comments)    Myalgias   Fenofibrate Diarrhea and Nausea And Vomiting    Simvastatin Nausea Only and Other (See Comments)    Myalgias   Soy Allergy Diarrhea and Nausea And Vomiting   Statins Nausea Only and Other (See Comments)     Myalgias   Dulaglutide Other (See Comments)   Empagliflozin-Metformin Hcl Other (See Comments)    Caused pancreatitis   Ozempic (0.25 Or 0.5 Mg-Dose) [Semaglutide(0.25 Or 0.5mg -Dos)]     Caused pancreatitis   Repatha [Evolocumab] Other (See Comments)    flu   Meloxicam Rash    Past Medical History:  Diagnosis Date   Asthma    Depression    Diabetes mellitus without complication (HCC)    Dyslipidemia, goal to be determined    High triglycerides and LDL   Fatty liver 05/16/2018   By CT scan   Fatty liver    Fibromyalgia    Gallbladder problem    Hyperlipidemia    Hypertension    Joint pain    Major depression, recurrent, chronic (HCC) 04/25/2018   Dr. Evelene Croon   Obesity due to excess calories without serious comorbidity    Osteoarthritis    Pancreatic disease    PCOS (polycystic ovarian syndrome)    SOBOE (shortness of breath on exertion)    Vitamin D deficiency     Height 5\' 2"  (1.575 m), weight 205 lb 9.3 oz (93.2 kg).   Mixed hyperlipidemia Patient with elevated LDL cholesterol and not at goal of < 55.   Reviewed options still available (Nexletol, Leqvio).  She is uncomfortable with the idea of injections every 6 months, so would prefer to try Nexletol.  She was given samples x 2 weeks in the office today.  At the end of the 2 weeks she will need to get a CMET and uric acid level.  If all is well will send in prescription for Nexlizet and have her discontinue the ezetimibe tablets.  She will need to repeat cholesterol labs after 3 months of treatment.    PharmD CPP [facility] [address] [place], [place] [phone]

## 2022-02-12 NOTE — ED Triage Notes (Signed)
Woke up with chest pain. Worse throughout the day. Intermittent. This episode start 2 hours ago more intense hasn't gone away. Indicates 6/10 chest pain under left breast. Feels sob, pain ins worse with deep breathes

## 2022-02-12 NOTE — ED Notes (Signed)
ED Provider at bedside. 

## 2022-02-12 NOTE — ED Notes (Signed)
Late note: RT placed PIV in left forearm w/out difficulty, also obtained labs for processing. Secured, flushed, and saline locked.

## 2022-02-12 NOTE — Assessment & Plan Note (Signed)
Patient with elevated LDL cholesterol and not at goal of < 55.   Reviewed options still available (Nexletol, Leqvio).  She is uncomfortable with the idea of injections every 6 months, so would prefer to try Nexletol.  She was given samples x 2 weeks in the office today.  At the end of the 2 weeks she will need to get a CMET and uric acid level.  If all is well will send in prescription for Nexlizet and have her discontinue the ezetimibe tablets.  She will need to repeat cholesterol labs after 3 months of treatment.

## 2022-02-15 NOTE — Progress Notes (Unsigned)
Chief Complaint:   OBESITY Megan Murray is here to discuss her progress with her obesity treatment plan along with follow-up of her obesity related diagnoses. Megan Murray is on the Category 2 Plan, keeping a food journal and adhering to recommended goals of 1200-1300 calories and 80 grams of protein, and following a lower carbohydrate, vegetable and lean protein rich diet plan and states she is following her eating plan approximately 90% of the time. Megan Murray states she is walking for 30 minutes 3 times per week.  Today's visit was #: 2 Starting weight: 199 lbs Starting date: 01/20/2022 Today's weight: 202 lbs Today's date: 02/03/2022 Total lbs lost to date: 0 Total lbs lost since last in-office visit: 0  Interim History: Megan Murray was consistent with eating on the plan but she did eat later at dinner the last week due to vacation [employer].  Has not been walking as much and looking at the [facility].  Frustrated by lack of weight loss.  Previously had pancreatitis from GLP-1 use.  Denies low blood sugars.  Subjective:   1. Primary insomnia Megan Murray is on Ambien 10 mg nightly for sleep.  She notes lack of sleep.  2. Other hyperlipidemia Megan Murray's LDL was 158 on her labs on 01/20/2022.  She is on Zetia daily 10 mg.  She has a history of statin intolerance.  3. Type 2 diabetes mellitus without complication, with [place]-term current use of insulin (HCC) Megan Murray's A1c was 7.1, decreased from 10.6.  She has a follow-up with Dr. [name].  She denies hypoglycemia.  4. Vitamin D deficiency Megan Murray is not on vitamin D supplementation.  Her vitamin D level was not drawn with her recent labs.  5. Fibromyalgia Megan Murray's fibromyalgia contributes to fatigue and pain that limits exercise.  This is likely a barrier to her weight loss progress and worsening her insulin resistance.  Assessment/Plan:   1. Primary insomnia Megan Murray is to work on aiming for 6-8  hours of sleep per night.  2. Other hyperlipidemia Megan Murray has a  follow-up scheduled with pharmD  to discuss treatment options.  3. Type 2 diabetes mellitus without complication, with [place]-term current use of insulin (HCC) Megan Murray will continue with her healthy eating plan.  She will continue [place] and Humalog as directed per endocrinology.  She is to increase exercise frequency to 30 minutes 3+ times per week.  4. Vitamin D deficiency Megan Murray is to have her Vitamin D level drawn with Dr. [name].   5. Fibromyalgia Megan Murray is to try walking at moderate pace or water exercises 3-4 times per week, as this would be ideal for her.  6. Obesity current- BMI 37.1 Megan Murray is currently in the action stage of change. As such, her goal is to continue with weight loss efforts. She has agreed to the Category 2 Plan.   Exercise goals: Walking all water exercises 30 minutes 3+ times per week.  Behavioral modification strategies: increasing lean protein intake, increasing water intake, no skipping meals, and better snacking choices.  Megan Murray has agreed to follow-up with our clinic in 3 weeks. She was informed of the importance of frequent follow-up visits to maximize her success with intensive lifestyle modifications for her multiple health conditions.   Objective:   Blood pressure 134/76, pulse 67, temperature 98.2 F (36.8 C), height 5\' 2"  (1.575 m), weight 202 lb (91.6 kg), SpO2 96 %. Body mass index is 36.95 kg/m.  General: Cooperative, alert, well developed, in no acute distress. HEENT: Conjunctivae and lids unremarkable. Cardiovascular: Regular rhythm.  Lungs: Normal work of breathing. Neurologic: No focal deficits.   Lab Results  Component Value Date   CREATININE 0.43 (L) 02/12/2022   BUN 15 02/12/2022   NA 135 02/12/2022   K 4.1 02/12/2022   CL 102 02/12/2022   CO2 22 02/12/2022   Lab Results  Component Value Date   ALT 25 10/05/2021   AST 18 10/05/2021   ALKPHOS 62 10/05/2021   BILITOT 0.5 10/05/2021   Lab Results  Component Value Date    HGBA1C 7.1 (H) 01/20/2022   HGBA1C 10.6 (H) 09/21/2021   HGBA1C 7.2 (H) 05/06/2021   HGBA1C 7.4 (H) 02/02/2021   HGBA1C 6.7 (H) 03/05/2020   Lab Results  Component Value Date   INSULIN 6.6 01/20/2022   Lab Results  Component Value Date   TSH 1.10 10/05/2021   Lab Results  Component Value Date   CHOL 238 (H) 01/20/2022   HDL 44 01/20/2022   LDLCALC 158 (H) 01/20/2022   LDLDIRECT 105.0 09/21/2021   TRIG 194 (H) 01/20/2022   CHOLHDL 5.4 (H) 01/20/2022   Lab Results  Component Value Date   VD25OH 55.88 04/26/2019   VD25OH 73.3 03/14/2018   Lab Results  Component Value Date   WBC 8.8 02/12/2022   HGB 14.9 02/12/2022   HCT 43.9 02/12/2022   MCV 86.9 02/12/2022   PLT 223 02/12/2022   Lab Results  Component Value Date   IRON 70 10/05/2021   TIBC 354 03/06/2021   FERRITIN 131 03/06/2021   Attestation Statements:   Reviewed by clinician on day of visit: allergies, medications, problem list, medical history, surgical history, family history, social history, and previous encounter notes.  Time spent on visit including pre-visit chart review and post-visit care and charting was 30 minutes.   Trude Mcburney, am acting as transcriptionist for Seymour Bars, DO.  I have reviewed the above documentation for accuracy and completeness, and I agree with the above. Glennis Brink, DO

## 2022-02-17 ENCOUNTER — Encounter (INDEPENDENT_AMBULATORY_CARE_PROVIDER_SITE_OTHER): Payer: Self-pay

## 2022-02-22 ENCOUNTER — Ambulatory Visit (INDEPENDENT_AMBULATORY_CARE_PROVIDER_SITE_OTHER): Payer: Medicare Other | Admitting: Medical

## 2022-02-22 VITALS — BP 124/78 | HR 71 | Temp 98.6°F | Resp 16 | Ht 62.0 in | Wt 206.0 lb

## 2022-02-22 DIAGNOSIS — R5383 Other fatigue: Secondary | ICD-10-CM

## 2022-02-22 DIAGNOSIS — E559 Vitamin D deficiency, unspecified: Secondary | ICD-10-CM

## 2022-02-22 DIAGNOSIS — M2550 Pain in unspecified joint: Secondary | ICD-10-CM | POA: Diagnosis not present

## 2022-02-22 DIAGNOSIS — Z794 Long term (current) use of insulin: Secondary | ICD-10-CM | POA: Diagnosis not present

## 2022-02-22 DIAGNOSIS — I10 Essential (primary) hypertension: Secondary | ICD-10-CM

## 2022-02-22 DIAGNOSIS — E119 Type 2 diabetes mellitus without complications: Secondary | ICD-10-CM

## 2022-02-22 DIAGNOSIS — R0789 Other chest pain: Secondary | ICD-10-CM

## 2022-02-22 DIAGNOSIS — E669 Obesity, unspecified: Secondary | ICD-10-CM

## 2022-02-22 DIAGNOSIS — E785 Hyperlipidemia, unspecified: Secondary | ICD-10-CM

## 2022-02-22 DIAGNOSIS — M797 Fibromyalgia: Secondary | ICD-10-CM

## 2022-02-22 DIAGNOSIS — E1169 Type 2 diabetes mellitus with other specified complication: Secondary | ICD-10-CM

## 2022-02-22 LAB — VITAMIN D 25 HYDROXY (VIT D DEFICIENCY, FRACTURES): VITD: 16.78 ng/mL — ABNORMAL LOW (ref 30.00–100.00)

## 2022-02-22 LAB — SEDIMENTATION RATE: Sed Rate: 25 mm/hr (ref 0–30)

## 2022-02-22 LAB — T4, FREE: Free T4: 0.84 ng/dL (ref 0.60–1.60)

## 2022-02-22 LAB — VITAMIN B12: Vitamin B-12: 571 pg/mL (ref 211–911)

## 2022-02-22 LAB — C-REACTIVE PROTEIN: CRP: <1 mg/dL (ref 0.5–20.0)

## 2022-02-22 LAB — TSH: TSH: 1.29 u[IU]/mL (ref 0.35–5.50)

## 2022-02-22 NOTE — Progress Notes (Signed)
Subjective:    Patient ID: Megan Murray, female    DOB: 08-02-1965, 56 y.o.   MRN: [id number]  HPI  Pt in for follow up. Recently had atypical chest pain in the ED. Below commend in " from ED.  "The history is provided by the patient.  She has history of hypertension, diabetes, hyperlipidemia, coronary artery disease and comes in complaining of left-sided chest pain.  She had pain 3 days ago which was intermittent, and then resolved until today when she had recurrence of pain.  Pain is described as a pressure feeling without radiation.  There is no associated dyspnea, nausea, diaphoresis.  It is worse when she takes a deep breath.  She has not taken anything for it.  She denies any recent trauma or unusual activity.  She denies any history of recent travel.  She denies history of prior DVT or pulmonary embolism.  She is a non-smoker.  There is a positive family history of premature coronary "  Chest pain which seems most likely to be musculoskeletal pain.  However, patient does have significant risk factors for coronary artery disease and does have known coronary artery disease per coronary CT scan.  Other diagnostic possibilities include pulmonary embolism, pneumothorax, pneumonia, pericarditis.  I have reviewed and interpreted her ECG, and my interpretation is normal ECG unchanged from prior.  Chest x-ray shows no active cardiopulmonary disease.  I have independently viewed the images, and agree with the radiologist interpretation.  I have reviewed and interpreted her laboratory tests, and my interpretation is mildly elevated glucose consistent with history of diabetes, normal troponin x2, normal CBC.  I have ordered a D-dimer to rule out pulmonary embolism.  Her heart score is 3, which gives her a low risk of major adverse cardiac events over the next 30 days.  Old records are reviewed, and coronary CT scan on 03/10/2021 showed a 50-70% lesion of the left circumflex artery, 25-49% lesion of the  posterior descending artery, minimal other coronary artery disease.  Echocardiogram on 03/11/2021 showed normal ejection fraction and grade 1 diastolic dysfunction.  I have ordered a dose of aspirin.   "D-dimer has come back normal.  Patient does not have pulmonary embolism.  She is felt to be safe for discharge at this point.  I have recommended that she use ice, over-the-counter NSAIDs, and acetaminophen.  Follow-up with PCP.  Also, she has an appointment with her cardiology pharmacist later today, she is to keep that appointment."  Pt did see [facility]  "Patient with elevated LDL cholesterol and not at goal of < 55.   Reviewed options still available (Nexletol, Leqvio).  She is uncomfortable with the idea of injections every 6 months, so would prefer to try Nexletol.  She was given samples x 2 weeks in the office today.  At the end of the 2 weeks she will need to get a CMET and uric acid level.  If all is well will send in prescription for Nexlizet and have her discontinue the ezetimibe tablets.  She will need to repeat cholesterol labs after 3 months of treatment."    From healthy wt loss managemtn . Primary insomnia Megan Murray is to work on aiming for 6-8  hours of sleep per night.   2. Other hyperlipidemia Megan Murray has a follow-up scheduled with pharmD  to discuss treatment options. Pharmacist has given nexlitol.  Pt has been using without any side effects.   3. Type 2 diabetes mellitus without complication, with Megan Murray current use  of Murray (HCC) Megan Murray will continue with her healthy eating plan.  She will continue Guinea-Bissauresiba and Humalog as directed per endocrinology.  She is to increase exercise frequency to 30 minutes 3+ times per week.   4. Vitamin D deficiency Megan Murray is to have her Vitamin D level drawn with Dr. Talmage Murray.    5. Fibromyalgia Megan Murray is to try walking at moderate pace or water exercises 3-4 times per week, as this would be ideal for her. Pt thinks some of chest pain may have been from  fibromyalgia. Pain is no longer presently. Pt states had trigger point injections and her insurance would not pay $300 cost of injections. Pt states she had gotten some injection in upper posterior shoulder and neck.    6. Obesity current- BMI 37.1 Megan Murray is currently in the action stage of change. As such, her goal is to continue with weight loss efforts. She has agreed to the Category 2 Plan.     Review of Systems  Constitutional:  Negative for chills, fatigue and fever.  Respiratory:  Negative for cough, choking, shortness of breath and wheezing.   Cardiovascular:  Negative for chest pain and palpitations.  Gastrointestinal:  Negative for abdominal pain, constipation, diarrhea and nausea.  Genitourinary:  Negative for dyspareunia, dysuria, enuresis, frequency, hematuria and pelvic pain.  Musculoskeletal:  Positive for arthralgias. Negative for back pain, joint swelling and myalgias.       Shoulder, wrist and ankles worse pain recently.    Past Medical History:  Diagnosis Date   Asthma    Depression    Diabetes mellitus without complication (HCC)    Dyslipidemia, goal to be determined    High triglycerides and LDL   Fatty liver 05/16/2018   By CT scan   Fatty liver    Fibromyalgia    Gallbladder problem    Hyperlipidemia    Hypertension    Joint pain    Major depression, recurrent, chronic (HCC) 04/25/2018   Megan Murray   Obesity due to excess calories without serious comorbidity    Osteoarthritis    Pancreatic disease    PCOS (polycystic ovarian syndrome)    SOBOE (shortness of breath on exertion)    Vitamin D deficiency      Social History   Socioeconomic History   Marital status: Married    Spouse name: Megan Murray   Number of children: 1   Years of education: Not on file   Highest education level: Not on file  Occupational History   Occupation: disabled due to fibromyalgia and depression  Tobacco Use   Smoking status: Never   Smokeless tobacco: Never  Vaping  Use   Vaping Use: Never used  Substance and Sexual Activity   Alcohol use: No   Drug use: No   Sexual activity: Yes  Other Topics Concern   Not on file  Social History Narrative   Not on file   Social Determinants of Health   Financial Resource Strain: Low Risk  (09/15/2021)   Overall Financial Resource Strain (CARDIA)    Difficulty of Paying Living Expenses: Not hard at all  Food Insecurity: No Food Insecurity (09/15/2021)   Hunger Vital Sign    Worried About Running Out of Food in the Last Year: Never true    Ran Out of Food in the Last Year: Never true  Transportation Needs: No Transportation Needs (09/15/2021)   PRAPARE - Administrator, Civil ServiceTransportation    Lack of Transportation (Medical): No    Lack of Transportation (Non-Medical):  No  Physical Activity: Not on file  Stress: No Stress Concern Present (09/15/2021)   [employer] of Occupational Health - Occupational Stress Questionnaire    Feeling of Stress : Not at all  Social Connections: Moderately Integrated (09/15/2021)   Social Connection and Isolation Panel [NHANES]    Frequency of Communication with Friends and Family: More than three times a week    Frequency of Social Gatherings with Friends and Family: Twice a week    Attends Religious Services: More than 4 times per year    Active Member of [employer] or Organizations: No    Attends Banker Meetings: Never    Marital Status: Married  Catering manager Violence: Not At Risk (09/15/2021)   Humiliation, Afraid, Rape, and Kick questionnaire    Fear of Current or Ex-Partner: No    Emotionally Abused: No    Physically Abused: No    Sexually Abused: No    Past Surgical History:  Procedure Laterality Date   ABDOMINAL HYSTERECTOMY     CESAREAN SECTION  1994   GALLBLADDER SURGERY  1995   REDUCTION MAMMAPLASTY      Family History  Problem Relation Age of Onset   Heart disease Mother    Hypertension Mother    COPD Mother        Died at 32   Heart failure Mother         Not sure of details   Depression Mother    Liver disease Father    Esophageal cancer Father        Survived cancer treatment and surgery   Drug abuse Father    Cirrhosis Father        NAFLD   Healthy Sister    Diabetes Brother    Depression Brother    Heart attack Brother    Heart disease Brother    Diabetes Maternal Grandmother    Arthritis Maternal Grandmother    Diabetes Maternal Grandfather    Cancer Paternal Grandmother    Hearing loss Paternal Grandmother    Liver disease Paternal Grandfather     Allergies  Allergen Reactions   Codeine Palpitations    Natural or Synthetic Codeine   Atorvastatin Nausea Only and Other (See Comments)    Myalgias   Cholestyramine Diarrhea and Nausea Only   Crestor [Rosuvastatin Calcium] Nausea Only and Other (See Comments)    Myalgias   Fenofibrate Diarrhea and Nausea And Vomiting   Simvastatin Nausea Only and Other (See Comments)    Myalgias   Soy Allergy Diarrhea and Nausea And Vomiting   Statins Nausea Only and Other (See Comments)     Myalgias   Dulaglutide Other (See Comments)   Empagliflozin-Metformin Hcl Other (See Comments)    Caused pancreatitis   Ozempic (0.25 Or 0.5 Mg-Dose) [Semaglutide(0.25 Or 0.5mg -Dos)]     Caused pancreatitis   Repatha [Evolocumab] Other (See Comments)    flu   Meloxicam Rash    Current Outpatient Medications on File Prior to Visit  Medication Sig Dispense Refill   albuterol (VENTOLIN HFA) 108 (90 Base) MCG/ACT inhaler Inhale 2 puffs into the lungs every 6 (six) hours as needed for wheezing or shortness of breath. 6.7 g 2   aspirin EC 81 MG tablet Take 1 tablet (81 mg total) by mouth daily. Swallow whole. 90 tablet 3   Continuous Blood Gluc Receiver (FREESTYLE LIBRE 2 READER) DEVI Use as directed 1 each 0   Continuous Blood Gluc Sensor (FREESTYLE LIBRE 2 SENSOR) MISC Use  as directed 2 each 5   ezetimibe (ZETIA) 10 MG tablet TAKE 1 TABLET(10 MG) BY MOUTH DAILY (Patient taking differently: Take 10  mg by mouth daily.) 90 tablet 2   FLUoxetine (PROZAC) 40 MG capsule TAKE 1 CAPSULE(40 MG) BY MOUTH DAILY 90 capsule 0   fluticasone furoate-vilanterol (BREO ELLIPTA) 200-25 MCG/ACT AEPB Inhale 1 puff into the lungs daily.     glucose blood test strip Use as instructed: One touch Ultra E11.9 100 each 12   ibuprofen (ADVIL) 600 MG tablet Take 1 tablet by mouth every 6 (six) hours as needed.     Murray lispro (HUMALOG KWIKPEN) 100 UNIT/ML KwikPen Inject 6 Units into the skin 3 (three) times daily. 15 mL 11   losartan (COZAAR) 50 MG tablet TAKE 1 TABLET(50 MG) BY MOUTH DAILY 90 tablet 0   pantoprazole (PROTONIX) 40 MG tablet Take 1 tablet (40 mg total) by mouth daily. 90 tablet 0   traMADol (ULTRAM) 50 MG tablet TAKE 1 TABLET(50 MG) BY MOUTH EVERY 8 HOURS FOR UP TO 5 DAYS AS NEEDED 30 tablet 1   TRESIBA FLEXTOUCH 200 UNIT/ML FlexTouch Pen SMARTSIG:50 Unit(s) SUB-Q Twice Daily     zolpidem (AMBIEN) 10 MG tablet TAKE 1 TABLET BY MOUTH AT BEDTIME AS NEEDED FOR INSOMNIA 30 tablet 0   No current facility-administered medications on file prior to visit.    BP 124/78 (BP Location: Right Arm, Patient Position: Sitting, Cuff Size: Large)   Pulse 71   Temp 98.6 F (37 C) (Oral)   Resp 16   Ht 5\' 2"  (1.575 m)   Wt 206 lb (93.4 kg)   SpO2 98%   BMI 37.68 kg/m          Objective:   Physical Exam  General Mental Status- Alert. General Appearance- Not in acute distress.   Skin General: Color- Normal Color. Moisture- Normal Moisture.  Neck Carotid Arteries- Normal color. Moisture- Normal Moisture. No carotid bruits. No JVD.  Chest and Lung Exam Auscultation: Breath Sounds:-Normal.  Cardiovascular Auscultation:Rythm- Regular. Murmurs & Other Heart Sounds:Auscultation of the heart reveals- No Murmurs.  Abdomen Inspection:-Inspeection Normal. Palpation/Percussion:Note:No mass. Palpation and Percussion of the abdomen reveal- Non Tender, Non Distended + BS, no rebound or  guarding.    Neurologic Cranial Nerve exam:- CN III-XII intact(No nystagmus), symmetric smile. Drift Test:- No drift. Romberg Exam:- Negative.  Heal to Toe Gait exam:-Normal. Finger to Nose:- Normal/Intact Strength:- 5/5 equal and symmetric strength both upper and lower extremities.       Assessment & Plan:   Insomnia Toneisha is to work on aiming for 6-8  hours of sleep per night. Will continue ambien.   2. Hyperlipidemia, high cholsterol and FH CAD Megan Murray has a follow-up scheduled with pharmD  to discuss treatment options. Pt has been on nexlitol per pharmacist. No side effects. Has upcoming appointment.   3. Type 2 diabetes mellitus without complication, with Megan Murray (HCC) Megan Murray will continue with her healthy eating plan.  Continue Tresiba and Humalog as directed per endocrinology.  She is to increase exercise frequency to 30 minutes 3+ times per week.   4. Vitamin D deficiency Per healthy weight loss management wants Megan Murray to vit D level. Will get that today.   5. Fibromyalgia Megan Murray is to try walking at moderate pace or water exercises 3-4 times per week, as this would be ideal for her. Since trigger pont injection helped in past placed referral to see if Dr. Jacki Murray sports med MD might  do.   6. Obesity current- BMI 37.1 Weight loss management states  Category 2 Plan. Wt loss manGement brought up gastirc bypass. Pt has gotten paperwork from [facility] and leaning toward getting surgery.  For recent joint pains worse past 2 months though had pain for years. Will get inflammatory lab tests.  Moods some worse as she gained weight. Phq 9 score   Follow up 3 month or sooner if needed.  Esperanza Richters, PA-C    Time spent with patient today was 40 minutes which consisted of chart review, discussing diagnosis, work up treatment and documentation.

## 2022-02-22 NOTE — Patient Instructions (Addendum)
Insomnia Megan Murray is to work on aiming for 6-8  hours of sleep per night. Will continue ambien.   2. Hyperlipidemia, high cholsterol and FH CAD Megan Murray has a follow-up scheduled with pharmD  to discuss treatment options. Pt has been on nexlitol per pharmacist. No side effects. Has upcoming appointment.   3. Type 2 diabetes mellitus without complication, with Yusupov-term current use of insulin (HCC) Megan Murray will continue with her healthy eating plan.  Continue Tresiba and Humalog as directed per endocrinology.  She is to increase exercise frequency to 30 minutes 3+ times per week. A1c 7.1 most recently.    4. Vitamin D deficiency Per healthy weight loss management wants [place] to vit D level. Will get that today.   5. Fibromyalgia Megan Murray is to try walking at moderate pace or water exercises 3-4 times per week, as this would be ideal for her. Since trigger pont injection helped in past placed referral to see if Dr. Jordan Likes sports med MD might do.   6. Obesity current- BMI 37.1 Weight loss management states  Category 2 Plan. Wt loss management brought up gastric bypass. Pt has gotten paperwork from [facility] and leaning toward getting surgery.  For recent joint pains worse past 2 months though had pain for years. Will get inflammatory lab tests.  Moods some worse as she gained weight. Phq 9 score  7. Continue prozac. Might ask weight loss management about use of wellbutrin. May improve mood and can at times be used for weight loss.  Follow up 3 month or sooner if needed.

## 2022-02-23 ENCOUNTER — Encounter: Payer: Self-pay | Admitting: Medical

## 2022-02-24 ENCOUNTER — Encounter: Payer: Self-pay | Admitting: Family Medicine

## 2022-02-24 ENCOUNTER — Ambulatory Visit (INDEPENDENT_AMBULATORY_CARE_PROVIDER_SITE_OTHER): Payer: Medicare Other | Admitting: Family Medicine

## 2022-02-24 VITALS — BP 137/85 | Ht 62.0 in | Wt 206.0 lb

## 2022-02-24 DIAGNOSIS — M7918 Myalgia, other site: Secondary | ICD-10-CM | POA: Diagnosis not present

## 2022-02-24 LAB — RHEUMATOID FACTOR: Rheumatoid fact SerPl-aCnc: <14 IU/mL (ref <14)

## 2022-02-24 LAB — ANA: Anti Nuclear Antibody (ANA): NEGATIVE

## 2022-02-24 MED ORDER — VITAMIN D (ERGOCALCIFEROL) 1.25 MG (50000 UNIT) PO CAPS: 50000.0000 [IU] | ORAL_CAPSULE | ORAL | 0 refills | Status: DC

## 2022-02-24 NOTE — Addendum Note (Signed)
Addended by: Gwenevere Abbot on: 02/24/2022 06:13 PM   Modules accepted: Orders

## 2022-02-24 NOTE — Assessment & Plan Note (Signed)
Acute on chronic in nature.  Trigger points appreciated through the upper thoracic and periscapular region. -Counseled on home exercise therapy and supportive care. -Referral to physical therapy. -Can pursue trigger point injections

## 2022-02-24 NOTE — Progress Notes (Signed)
  Megan Murray - 56 y.o. female MRN [id number]  Date of birth: 1966-04-05  SUBJECTIVE:  Including CC & ROS.  No chief complaint on file.   [name] is a 56 y.o. female that is presenting with acute on chronic myofascial type pain along the trapezius on the left as well as the periscapular region.  Has tried trigger point injections in the past and has done well for her.  Denies any surgery.  No radicular type pain.   Review of Systems See HPI   HISTORY: Past Medical, Surgical, Social, and Family History Reviewed & Updated per EMR.   Pertinent Historical Findings include:  Past Medical History:  Diagnosis Date   Asthma    Depression    Diabetes mellitus without complication (HCC)    Dyslipidemia, goal to be determined    High triglycerides and LDL   Fatty liver 05/16/2018   By CT scan   Fatty liver    Fibromyalgia    Gallbladder problem    Hyperlipidemia    Hypertension    Joint pain    Major depression, recurrent, chronic (HCC) 04/25/2018   Dr. Evelene Croon   Obesity due to excess calories without serious comorbidity    Osteoarthritis    Pancreatic disease    PCOS (polycystic ovarian syndrome)    SOBOE (shortness of breath on exertion)    Vitamin D deficiency     Past Surgical History:  Procedure Laterality Date   ABDOMINAL HYSTERECTOMY     CESAREAN SECTION  1994   GALLBLADDER SURGERY  1995   REDUCTION MAMMAPLASTY       PHYSICAL EXAM:  VS: BP 137/85 (BP Location: Left Arm, Patient Position: Sitting)   Ht 5\' 2"  (1.575 m)   Wt 206 lb (93.4 kg)   BMI 37.68 kg/m  Physical Exam Gen: NAD, alert, cooperative with exam, well-appearing MSK:  Neurovascularly intact       ASSESSMENT & PLAN:   Myofascial pain Acute on chronic in nature.  Trigger points appreciated through the upper thoracic and periscapular region. -Counseled on home exercise therapy and supportive care. -Referral to physical therapy. -Can pursue trigger point injections

## 2022-02-24 NOTE — Patient Instructions (Signed)
Nice to meet you  Please try heat  Please try the exercises  I have placed a referral to physical therapy  Let us know if we're in network Please send me a message in MyChart with any questions or updates.  Please see me back to have the injections.   --Dr. Jordan Likes

## 2022-02-24 NOTE — Addendum Note (Signed)
Addended by: Gwenevere Abbot on: 02/24/2022 06:14 PM   Modules accepted: Orders

## 2022-02-25 ENCOUNTER — Encounter (INDEPENDENT_AMBULATORY_CARE_PROVIDER_SITE_OTHER): Payer: Self-pay

## 2022-02-25 ENCOUNTER — Encounter (INDEPENDENT_AMBULATORY_CARE_PROVIDER_SITE_OTHER): Payer: Self-pay | Admitting: Family Medicine

## 2022-02-25 ENCOUNTER — Ambulatory Visit (INDEPENDENT_AMBULATORY_CARE_PROVIDER_SITE_OTHER): Payer: Medicare Other | Admitting: Family Medicine

## 2022-02-25 ENCOUNTER — Encounter: Payer: Self-pay | Admitting: Medical

## 2022-02-25 ENCOUNTER — Encounter: Payer: Self-pay | Admitting: Family Medicine

## 2022-02-25 VITALS — BP 129/73 | HR 74 | Temp 98.2°F | Ht 62.0 in | Wt 203.0 lb

## 2022-02-25 DIAGNOSIS — Z794 Long term (current) use of insulin: Secondary | ICD-10-CM

## 2022-02-25 DIAGNOSIS — Z6837 Body mass index (BMI) 37.0-37.9, adult: Secondary | ICD-10-CM

## 2022-02-25 DIAGNOSIS — E119 Type 2 diabetes mellitus without complications: Secondary | ICD-10-CM | POA: Diagnosis not present

## 2022-02-25 DIAGNOSIS — E559 Vitamin D deficiency, unspecified: Secondary | ICD-10-CM

## 2022-02-25 DIAGNOSIS — E669 Obesity, unspecified: Secondary | ICD-10-CM | POA: Diagnosis not present

## 2022-02-25 DIAGNOSIS — E7849 Other hyperlipidemia: Secondary | ICD-10-CM

## 2022-02-25 DIAGNOSIS — F5101 Primary insomnia: Secondary | ICD-10-CM

## 2022-02-26 LAB — COMPREHENSIVE METABOLIC PANEL
ALT: 15 IU/L (ref 0–32)
AST: 15 IU/L (ref 0–40)
Albumin/Globulin Ratio: 1.8 (ref 1.2–2.2)
Albumin: 4.4 g/dL (ref 3.8–4.9)
Alkaline Phosphatase: 79 IU/L (ref 44–121)
BUN/Creatinine Ratio: 24 — ABNORMAL HIGH (ref 9–23)
BUN: 11 mg/dL (ref 6–24)
Bilirubin Total: 0.3 mg/dL (ref 0.0–1.2)
CO2: 24 mmol/L (ref 20–29)
Calcium: 9.3 mg/dL (ref 8.7–10.2)
Chloride: 101 mmol/L (ref 96–106)
Creatinine, Ser: 0.46 mg/dL — ABNORMAL LOW (ref 0.57–1.00)
Globulin, Total: 2.4 g/dL (ref 1.5–4.5)
Glucose: 129 mg/dL — ABNORMAL HIGH (ref 70–99)
Potassium: 4.3 mmol/L (ref 3.5–5.2)
Sodium: 138 mmol/L (ref 134–144)
Total Protein: 6.8 g/dL (ref 6.0–8.5)
eGFR: 112 mL/min/{1.73_m2} (ref >59)

## 2022-02-26 LAB — URIC ACID: Uric Acid: 4.2 mg/dL (ref 3.0–7.2)

## 2022-03-01 MED ORDER — ZOLPIDEM TARTRATE 10 MG PO TABS: ORAL_TABLET | ORAL | 3 refills | Status: DC

## 2022-03-01 NOTE — Telephone Encounter (Addendum)
Requesting: Remus Loffler Contract:09/21/21 UDS:09/21/21 Last Visit:02/22/22 Last Refill:01/28/22  Please Advise   Rx sent to pt pharmacy.  Esperanza Richters, PA-C

## 2022-03-03 ENCOUNTER — Telehealth: Payer: Self-pay

## 2022-03-03 NOTE — Telephone Encounter (Signed)
Patient notified of results.

## 2022-03-03 NOTE — Telephone Encounter (Signed)
-----   Message from Georgeanna Lea, MD sent at 02/26/2022  8:57 AM EDT ----- Labs are fine,

## 2022-03-08 ENCOUNTER — Encounter (HOSPITAL_BASED_OUTPATIENT_CLINIC_OR_DEPARTMENT_OTHER): Payer: Self-pay | Admitting: Pharmacist Clinician (PhC)/ Clinical Pharmacy Specialist

## 2022-03-08 NOTE — Progress Notes (Signed)
Chief Complaint:   OBESITY Megan Murray is here to discuss her progress with her obesity treatment plan along with follow-up of her obesity related diagnoses. Megan Murray is on the Category 2 Plan and states she is following her eating plan approximately 90% of the time. Megan Murray states she is walking for 30 minutes 3-4 times per week.  Today's visit was #: 3 Starting weight: 199 lbs Starting date: 01/20/2022 Today's weight: 203 lbs Today's date: 02/25/2022 Total lbs lost to date: 0 Total lbs lost since last in-office visit: 0  Interim History: Megan Murray's hunger has cut back.  She is trying not to skip meals.  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  She is doing better with buying food for herself.  She is doing more walking.  Has had 1 low blood sugar with dietary changes.  Considering gastric bypass and attended a bariatric surgery seminar with [facility].  Subjective:   1. Type 2 diabetes mellitus without complication, with Skolnick-term current use of insulin (HCC) Megan Murray's last A1c was 7.1 with Dr. [name] on insulin.  Has CGM with rare lows.  She avoids high sugar foods/drinks.  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   2. Vitamin D deficiency Madalina's vitamin D level was 16 on 02/22/2022.  She complains of fatigue.  3. Other hyperlipidemia Megan Murray's total cholesterol was 238, HDL 44, triglycerides 194, and LDL 158 on 01/20/2022.  She is with statin intolerance; on Zetia.  Assessment/Plan:   1. Type 2 diabetes mellitus without complication, with Spilker-term current use of insulin (HCC) Good blood sugar control is important to decrease the likelihood of diabetic complications such as nephropathy, neuropathy, limb loss, blindness, coronary artery disease, and death. Intensive lifestyle modification including diet, exercise and weight loss are the first line of treatment for diabetes.   - Amb Referral to Bariatric Surgery  2. Vitamin D deficiency Megan Murray started prescription  vitamin D 50,000 units once weekly through her PCP.  3. Other hyperlipidemia Megan Murray continue to work on a heart healthy diet, more regular exercise, and weight loss.  4. Obesity, current BMI 37.2 Megan Murray is currently in the action stage of change. As such, her goal is to continue with weight loss efforts. She has agreed to the Category 2 Plan and keeping a food journal and adhering to recommended goals of 1300 calories and 85 grams of protein daily.   Referral was made to [facility] bariatric surgery for evaluation and treatment given BMI greater than 35 and type 2 diabetes mellitus.  - Amb Referral to Bariatric Surgery  Exercise goals: As is.   Behavioral modification strategies: increasing lean protein intake, increasing water intake, decreasing eating out, no skipping meals, better snacking choices, and decreasing junk food.  Megan Murray has agreed to follow-up with our clinic in 4 weeks. She was informed of the importance of frequent follow-up visits to maximize her success with intensive lifestyle modifications for her multiple health conditions.   Objective:   Blood pressure 129/73, pulse 74, temperature 98.2 F (36.8 C), height 5\' 2"  (1.575 m), weight 203 lb (92.1 kg), SpO2 97 %. Body mass index is 37.13 kg/m.  General: Cooperative, alert, well developed, in no acute distress. HEENT: Conjunctivae and lids unremarkable. Cardiovascular: Regular rhythm.  Lungs: Normal work of breathing. Neurologic: No focal deficits.   Lab Results  Component Value Date   CREATININE 0.46 (L) 02/25/2022   BUN 11 02/25/2022   NA 138 02/25/2022   K 4.3 02/25/2022  CL 101 02/25/2022   CO2 24 02/25/2022   Lab Results  Component Value Date   ALT 15 02/25/2022   AST 15 02/25/2022   ALKPHOS 79 02/25/2022   BILITOT 0.3 02/25/2022   Lab Results  Component Value Date   HGBA1C 7.1 (H) 01/20/2022   HGBA1C 10.6 (H) 09/21/2021   HGBA1C 7.2 (H) 05/06/2021   HGBA1C 7.4 (H) 02/02/2021   HGBA1C 6.7  (H) 03/05/2020   Lab Results  Component Value Date   INSULIN 6.6 01/20/2022   Lab Results  Component Value Date   TSH 1.29 02/22/2022   Lab Results  Component Value Date   CHOL 238 (H) 01/20/2022   HDL 44 01/20/2022   LDLCALC 158 (H) 01/20/2022   LDLDIRECT 105.0 09/21/2021   TRIG 194 (H) 01/20/2022   CHOLHDL 5.4 (H) 01/20/2022   Lab Results  Component Value Date   VD25OH 16.78 (L) 02/22/2022   VD25OH 55.88 04/26/2019   VD25OH 73.3 03/14/2018   Lab Results  Component Value Date   WBC 8.8 02/12/2022   HGB 14.9 02/12/2022   HCT 43.9 02/12/2022   MCV 86.9 02/12/2022   PLT 223 02/12/2022   Lab Results  Component Value Date   IRON 70 10/05/2021   TIBC 354 03/06/2021   FERRITIN 131 03/06/2021   Attestation Statements:   Reviewed by clinician on day of visit: allergies, medications, problem list, medical history, surgical history, family history, social history, and previous encounter notes.   Trude Mcburney, am acting as transcriptionist for Seymour Bars, DO.  I have reviewed the above documentation for accuracy and completeness, and I agree with the above. Glennis Brink, DO

## 2022-03-10 ENCOUNTER — Telehealth: Payer: Self-pay | Admitting: Medical

## 2022-03-10 NOTE — Telephone Encounter (Signed)
Patient husband dropped off form to be filled out  Would like it to be faxed when completed  Fax to [phone]

## 2022-03-11 ENCOUNTER — Telehealth: Payer: Self-pay | Admitting: Pharmacist Clinician (PhC)/ Clinical Pharmacy Specialist

## 2022-03-11 MED ORDER — NEXLIZET 180-10 MG PO TABS: 1.0000 | ORAL_TABLET | Freq: Every day | ORAL | 3 refills | Status: DC

## 2022-03-11 NOTE — Telephone Encounter (Signed)
Nexlizet rx sent to pharmacy

## 2022-03-12 ENCOUNTER — Encounter (INDEPENDENT_AMBULATORY_CARE_PROVIDER_SITE_OTHER): Payer: Self-pay | Admitting: Family Medicine

## 2022-03-16 ENCOUNTER — Encounter: Payer: Self-pay | Admitting: Medical

## 2022-03-16 NOTE — Telephone Encounter (Signed)
Appt has been cancelled.  

## 2022-03-18 ENCOUNTER — Ambulatory Visit (INDEPENDENT_AMBULATORY_CARE_PROVIDER_SITE_OTHER): Payer: Medicare Other | Admitting: Family Medicine

## 2022-03-23 ENCOUNTER — Other Ambulatory Visit: Payer: Self-pay | Admitting: Medical

## 2022-03-23 MED ORDER — FREESTYLE LIBRE 2 READER DEVI: 0 refills | Status: DC

## 2022-03-23 MED ORDER — TRAMADOL HCL 50 MG PO TABS: ORAL_TABLET | ORAL | 1 refills | Status: DC

## 2022-03-23 NOTE — Telephone Encounter (Addendum)
Last OV--02/22/2022 Last RF---#30 with 1 refill on 12/10/2021  Rx refill sent to patient's pharmacy  Esperanza Richters, PA-C

## 2022-03-31 ENCOUNTER — Ambulatory Visit: Payer: Medicare Other | Admitting: Physical Therapy

## 2022-04-05 NOTE — Therapy (Unsigned)
OUTPATIENT PHYSICAL THERAPY THORACOLUMBAR EVALUATION   Patient Name: Megan Murray MRN: [id number] DOB:03/17/1966, 56 y.o., female Today's Date: 04/06/2022   PT End of Session - 04/06/22 0921     Visit Number 1    Date for PT Re-Evaluation 06/01/22    Authorization Type UHC Medicare    PT Start Time 0846    PT Stop Time 0921    PT Time Calculation (min) 35 min    Activity Tolerance Patient tolerated treatment well    Behavior During Therapy [facility] for tasks assessed/performed             Past Medical History:  Diagnosis Date   Asthma    Depression    Diabetes mellitus without complication ([place])    Dyslipidemia, goal to be determined    High triglycerides and LDL   Fatty liver 05/16/2018   By CT scan   Fatty liver    Fibromyalgia    Gallbladder problem    Hyperlipidemia    Hypertension    Joint pain    Major depression, recurrent, chronic ([place]) 04/25/2018   Dr. Toy Care   Obesity due to excess calories without serious comorbidity    Osteoarthritis    Pancreatic disease    PCOS (polycystic ovarian syndrome)    SOBOE (shortness of breath on exertion)    Vitamin D deficiency    Past Surgical History:  Procedure Laterality Date   ABDOMINAL HYSTERECTOMY     [place]     Patient Active Problem List   Diagnosis Date Noted   Other fatigue 01/20/2022   SOBOE (shortness of breath on exertion) 01/20/2022   Vitamin D deficiency 01/20/2022   Other hyperlipidemia 01/20/2022   Depression 01/20/2022   DKA (diabetic ketoacidosis) () 09/27/2021   Prolonged QT interval 09/27/2021   COVID-19 05/14/2021   Coronary artery disease moderate based on coronary CT angio of circumflex 04/29/2021   Dyspnea on exertion 02/20/2021   Atypical chest pain 02/20/2021   Obesity due to excess calories without serious comorbidity 02/19/2021   Dyslipidemia, goal to be determined 02/19/2021   Diabetes mellitus ([place]) 02/19/2021    Reactive airway disease 02/19/2021   Myofascial pain 11/12/2019   Primary osteoarthritis of left knee 11/12/2019   Primary osteoarthritis of right knee 11/12/2019   Cough variant asthma 08/10/2019   Chronic fatigue 04/26/2019   Gastroesophageal reflux disease without esophagitis 04/26/2019   Statin intolerance 06/21/2018   Seasonal allergic rhinitis due to pollen 05/19/2018   Fatty liver 05/16/2018   Fibromyalgia 04/25/2018   Major depression, recurrent, chronic ([place]) 04/25/2018   Osteoarthritis, multiple sites 04/25/2018   Essential hypertension 11/09/2016   Type 2 diabetes mellitus without complication, without [place]ck[place]-term current use of insulin ([place]) 11/09/2016   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   Mixed hyperlipidemia 11/04/2016   B12 deficiency 01/23/2015    PCP: Cline Crock  REFERRING PROVIDER: Clearance Coots, MD  REFERRING DIAG: M79.18 (ICD-10-CM) - Myofascial pain  Rationale for Evaluation and Treatment Rehabilitation  THERAPY DIAG:  Cervicalgia - Plan: PT plan of care cert/re-cert  Myofascial pain - Plan: PT plan of care cert/re-cert  Pain in left ankle and joints of left foot - Plan: PT plan of care cert/re-cert  Cramp and spasm - Plan: PT plan of care cert/re-cert  ONSET DATE: chronic with worsening over the past 3 months-history of fibromyalgia  SUBJECTIVE:  SUBJECTIVE STATEMENT: Pt with chronic history of widespread pain associated with fibromyalgia.  Pt highlights Lt upper trap/neck and arm pain and Lt>Rt foot pain.  Pt has received trigger point injection and can't afford to continue with these.  Pt has had dry needling in the past and this helped.  She is also interested in aquatics.   PERTINENT HISTORY:  Depression, asthma, DM, fibromyalgia,   PAIN:   Are you having pain? Yes: NPRS scale: 3-4/10, up to 8-9/10 max  Pain location: Lt>Rt ankle, Lt neck and arm Pain description: stiffness Aggravating factors: sitting for Rames periods and then getting back up, walking too [place], descending steps  Relieving factors: hot tub, stretching, massage  PRECAUTIONS: None  WEIGHT BEARING RESTRICTIONS No  FALLS:  Has patient fallen in last 6 months? No  LIVING ENVIRONMENT: Lives with: lives with their family Lives in: House/apartment Stairs: Yes: External: 4 steps Has following equipment at home: None  OCCUPATION: no  PLOF: Independent and Leisure: walking 2-3 miles 3-4x/wk  PATIENT GOALS reduce myofascial pain, exercise with less pain, housework with less pain  OBJECTIVE:   DIAGNOSTIC FINDINGS:  None recent   PATIENT SURVEYS:  Fibromyalgia Impact questionnaire: 38   SCREENING FOR RED FLAGS: Bowel or bladder incontinence: No Spinal tumors: No Cauda equina syndrome: No Compression fracture: No Abdominal aneurysm: No  COGNITION:  Overall cognitive status: Within functional limits for tasks assessed     SENSATION: WFL  MUSCLE LENGTH: Hamstrings limited by 25% bil.   POSTURE: rounded shoulders and forward head  PALPATION: Diffuse palpable tenderness over bil upper traps, rhomboids and cervical paraspinals.  Tender over lateral/distal achilles tendon.   LUMBAR ROM:  Cervical A/ROM: Full except Rt rotation limited by 25%.  Lt neck pain with all cervical A/ROM.  Bil shoulder A/ROM is full with pain on Lt with adduction and end range flexion with overpressure.   LOWER EXTREMITY ROM:   WFLs in bil ankles  LOWER EXTREMITY MMT:   Rt hip flexion 4-/5, other hip 4+/5 bil, knees 4+/5, ankles 4+/5  GAIT: Distance walked: 50 Assistive device utilized: None Level of assistance: Complete Independence Comments: WFLs  TODAY'S TREATMENT  Date: 04/06/22 HEP established-see below  Discussed aquatics and DN   PATIENT EDUCATION:   Education details: Access Code: [email or web address] Person educated: Patient Education method: Explanation, Demonstration, and Handouts Education comprehension: verbalized understanding and returned demonstration   HOME EXERCISE PROGRAM: Access Code: [email or web address] URL: [email or web address] Date: 04/06/2022 Prepared by: Tresa Endo  Exercises - Seated Cervical Flexion AROM  - 3 x daily - 7 x weekly - 1 sets - 3 reps - 20 hold - Seated Cervical Sidebending AROM  - 3 x daily - 7 x weekly - 1 sets - 3 reps - 20 hold - Seated Cervical Rotation AROM  - 3 x daily - 7 x weekly - 1 sets - 3 reps - 20 hold - Seated Correct Posture  - 1 x daily - 7 x weekly - 3 sets - 10 reps - Shoulder Flexion Wall Slide with Towel  - 2 x daily - 7 x weekly - 1 sets - 10 reps - 10 hold - Seated Hamstring Stretch  - 3 x daily - 7 x weekly - 1 sets - 3 reps - 20 hold  ASSESSMENT:  CLINICAL IMPRESSION: Patient is a 56 y.o. female who was seen today for physical therapy evaluation and treatment for myofacial pain. Pt reports that she had an influx in pain 3 months ago.  She has responded  well to DN and aquatic PT in the past and would like to resume this.  Pt reports Lt neck, shoulder and Lt ankle pain today that is up to 9/10 with activity.  Pt with limited and painful cervical A/ROM and painful Lt shoulder A/ROM.  Patient will benefit from skilled PT to address the below impairments and improve overall function.    OBJECTIVE IMPAIRMENTS decreased ROM, decreased strength, increased muscle spasms, impaired flexibility, impaired UE functional use, postural dysfunction, and pain.   ACTIVITY LIMITATIONS sitting, standing, stairs, and locomotion level  PARTICIPATION LIMITATIONS: cleaning, laundry, shopping, and community activity  PERSONAL FACTORS Time since onset of injury/illness/exacerbation and 1-2 comorbidities: fibromyalgia, depression, asthma  are also affecting patient's functional outcome.   REHAB POTENTIAL:  Good  CLINICAL DECISION MAKING: Evolving/moderate complexity  EVALUATION COMPLEXITY: Moderate   GOALS: Goals reviewed with patient? Yes  SHORT TERM GOALS: Target date: 05/04/2022  Be independent in initial HEP Baseline: Goal status: INITIAL  2.  Reduce fibromyalgia impact score to < or to 32 to improve function Baseline:  Goal status: INITIAL  3.  Report > or = to 30% reduction in neck and ankle pain with daily tasks  Baseline:  Goal status: INITIAL    [place] TERM GOALS: Target date: 06/01/2022  Be independent in advanced HEP Baseline:  Goal status: INITIAL  2.  Reduce fibromyalgia impact score to < or = to 25 to improve function Baseline:  Goal status: INITIAL  3.  Report > or = to 50% reduction in neck and Lt ankle pain with ADLs and self-care Baseline:  Goal status: INITIAL  4.  Become independent in aquatic exercises for ability to exercise with less pain Baseline:  Goal status: INITIAL    PLAN: PT FREQUENCY: 1x/week  PT DURATION: 8 weeks  PLANNED INTERVENTIONS: Therapeutic exercises, Therapeutic activity, Neuromuscular re-education, Balance training, Gait training, Patient/Family education, Self Care, Joint mobilization, Stair training, Aquatic Therapy, Dry Needling, Electrical stimulation, Spinal manipulation, Spinal mobilization, Cryotherapy, Moist heat, Taping, Vasopneumatic device, Manual therapy, and Re-evaluation.  PLAN FOR NEXT SESSION: alternate land and aquatics every other week.  DN and advancement of HEP when on land.    Lorrene Reid, PT 04/06/22 10:42 AM

## 2022-04-06 ENCOUNTER — Other Ambulatory Visit: Payer: Self-pay

## 2022-04-06 ENCOUNTER — Ambulatory Visit: Payer: Medicare Other | Attending: Family Medicine

## 2022-04-06 DIAGNOSIS — M7918 Myalgia, other site: Secondary | ICD-10-CM | POA: Diagnosis present

## 2022-04-06 DIAGNOSIS — R252 Cramp and spasm: Secondary | ICD-10-CM | POA: Insufficient documentation

## 2022-04-06 DIAGNOSIS — M542 Cervicalgia: Secondary | ICD-10-CM | POA: Diagnosis present

## 2022-04-06 DIAGNOSIS — M25572 Pain in left ankle and joints of left foot: Secondary | ICD-10-CM | POA: Diagnosis present

## 2022-04-06 NOTE — Patient Instructions (Signed)
     [facility] Welcome to [facility]! Here you will find all the information you will need regarding your pool therapy. If you have further questions at any time, please call our office at [phone]. After completing your initial evaluation in the [facility], you may be eligible to complete a portion of your therapy in the pool. A typical week of therapy will consist of 1-2 typical physical therapy visits at our [facility] location and an additional session of therapy in the pool located at the [facility] at [address]. [address], [place] [address]. The phone number at the pool site is [phone]. Please call this number if you are running late or need to cancel your appointment.  Aquatic therapy will be offered on Wednesday mornings and Friday afternoons. Each session will last approximately 45 minutes. All scheduling and payments for aquatic therapy sessions, including cancelations, will be done through our [facility] location.  To be eligible for aquatic therapy, these criteria must be met: You must be able to independently change in the locker room and get to the pool deck. A caregiver can come with you to help if needed. There are benches for a caregiver to sit on next to the pool. No one with an open wound is permitted in the pool.  Handicap parking is available in the front and there is a drop off option for even closer accessibility. Please arrive 15 minutes prior to your appointment to prepare for your pool session. You must sign in at the front desk upon your arrival. Please be sure to attend to any toileting needs prior to entering the pool. Locker rooms for changing are available.  There is direct access to the pool deck from the locker room. You can lock your belongings in a locker or bring them with you poolside. Your therapist will greet you on the pool deck. There may be other swimmers in the pool at the  same time but your session is one-on-one with the therapist.   

## 2022-04-12 ENCOUNTER — Other Ambulatory Visit (HOSPITAL_COMMUNITY): Payer: Self-pay | Admitting: General Surgery

## 2022-04-12 ENCOUNTER — Other Ambulatory Visit: Payer: Self-pay | Admitting: General Surgery

## 2022-04-13 ENCOUNTER — Ambulatory Visit: Payer: Medicare Other | Attending: Family Medicine

## 2022-04-13 ENCOUNTER — Telehealth: Payer: Self-pay

## 2022-04-13 DIAGNOSIS — R252 Cramp and spasm: Secondary | ICD-10-CM | POA: Diagnosis present

## 2022-04-13 DIAGNOSIS — M25572 Pain in left ankle and joints of left foot: Secondary | ICD-10-CM | POA: Diagnosis present

## 2022-04-13 DIAGNOSIS — M542 Cervicalgia: Secondary | ICD-10-CM | POA: Insufficient documentation

## 2022-04-13 DIAGNOSIS — M7918 Myalgia, other site: Secondary | ICD-10-CM | POA: Diagnosis not present

## 2022-04-13 NOTE — Telephone Encounter (Signed)
    [facility] Pre-operative Risk Assessment    Request for surgical clearance:  What type of surgery is being performed? Bariatric surgery  When is this surgery scheduled? TBD   What type of clearance is required (medical clearance vs. Pharmacy clearance to hold med vs. Both)? Both  Are there any medications that need to be held prior to surgery and how [place]?Not specified    Practice name and name of physician performing surgery? Dr. Greer Pickerel at [facility]   What is your office phone number: [phone]    7.   What is your office fax number: [phone]  8.   Anesthesia type (None, local, MAC, general) ? General Anesthesia    Basil Dess Arra Connaughton 04/13/2022, 9:00 AM  _________________________________________________________________   (provider comments below)

## 2022-04-13 NOTE — Therapy (Signed)
OUTPATIENT PHYSICAL THERAPY TREATMENT   Patient Name: Megan Murray MRN: [id number] DOB:Nov 23, 1965, 56 y.o., female Today's Date: 04/13/2022   PT End of Session - 04/13/22 1057     Visit Number 2    Date for PT Re-Evaluation 06/01/22    Authorization Type UHC Medicare    PT Start Time 1016    PT Stop Time 1059    PT Time Calculation (min) 43 min    Activity Tolerance Patient tolerated treatment well    Behavior During Therapy WFL for tasks assessed/performed              Past Medical History:  Diagnosis Date   Asthma    Depression    Diabetes mellitus without complication ([facility])    Dyslipidemia, goal to be determined    High triglycerides and LDL   Fatty liver 05/16/2018   By CT scan   Fatty liver    Fibromyalgia    Gallbladder problem    Hyperlipidemia    Hypertension    Joint pain    Major depression, recurrent, chronic ([facility]) 04/25/2018   Dr. Evelene Croon   Obesity due to excess calories without serious comorbidity    Osteoarthritis    Pancreatic disease    PCOS (polycystic ovarian syndrome)    SOBOE (shortness of breath on exertion)    Vitamin D deficiency    Past Surgical History:  Procedure Laterality Date   ABDOMINAL HYSTERECTOMY     CESAREAN SECTION  1994   GALLBLADDER SURGERY  1995   REDUCTION MAMMAPLASTY     Patient Active Problem List   Diagnosis Date Noted   Other fatigue 01/20/2022   SOBOE (shortness of breath on exertion) 01/20/2022   Vitamin D deficiency 01/20/2022   Other hyperlipidemia 01/20/2022   Depression 01/20/2022   DKA (diabetic ketoacidosis) ([facility]) 09/27/2021   Prolonged QT interval 09/27/2021   COVID-19 05/14/2021   Coronary artery disease moderate based on coronary CT angio of circumflex 04/29/2021   Dyspnea on exertion 02/20/2021   Atypical chest pain 02/20/2021   Obesity due to excess calories without serious comorbidity 02/19/2021   Dyslipidemia, goal to be determined 02/19/2021   Diabetes mellitus ([facility]) 02/19/2021   Reactive  airway disease 02/19/2021   Myofascial pain 11/12/2019   Primary osteoarthritis of left knee 11/12/2019   Primary osteoarthritis of right knee 11/12/2019   Cough variant asthma 08/10/2019   Chronic fatigue 04/26/2019   Gastroesophageal reflux disease without esophagitis 04/26/2019   Statin intolerance 06/21/2018   Seasonal allergic rhinitis due to pollen 05/19/2018   Fatty liver 05/16/2018   Fibromyalgia 04/25/2018   Major depression, recurrent, chronic ([facility]) 04/25/2018   Osteoarthritis, multiple sites 04/25/2018   Essential hypertension 11/09/2016   Type 2 diabetes mellitus without complication, without Wages-term current use of insulin ([facility]) 11/09/2016   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   Mixed hyperlipidemia 11/04/2016   B12 deficiency 01/23/2015    PCP: Joneen Boers  REFERRING PROVIDER: Clare Gandy, MD  REFERRING DIAG: M79.18 (ICD-10-CM) - Myofascial pain  Rationale for Evaluation and Treatment Rehabilitation  THERAPY DIAG:  Myofascial pain  Cervicalgia  Pain in left ankle and joints of left foot  Cramp and spasm  ONSET DATE: chronic with worsening over the past 3 months-history of fibromyalgia  SUBJECTIVE:  SUBJECTIVE STATEMENT: I was sore after doing my stretches. I used the theracane and it helped.    PERTINENT HISTORY:  Depression, asthma, DM, fibromyalgia,   PAIN:  Are you having pain? Yes: NPRS scale: 3/10, up to 8/10 max  Pain location: Lt>Rt ankle, Lt neck and arm Pain description: stiffness Aggravating factors: sitting for [place] periods and then getting back up, walking too [place], descending steps  Relieving factors: hot tub, stretching, massage  PRECAUTIONS: None  WEIGHT BEARING RESTRICTIONS No  FALLS:  Has patient fallen in last 6 months?  No  LIVING ENVIRONMENT: Lives with: lives with their family Lives in: House/apartment Stairs: Yes: External: 4 steps Has following equipment at home: None  OCCUPATION: no  PLOF: Independent and Leisure: walking 2-3 miles 3-4x/wk  PATIENT GOALS reduce myofascial pain, exercise with less pain, housework with less pain  OBJECTIVE:   DIAGNOSTIC FINDINGS:  None recent   PATIENT SURVEYS:  Fibromyalgia Impact questionnaire: 38   SCREENING FOR RED FLAGS: Bowel or bladder incontinence: No Spinal tumors: No Cauda equina syndrome: No Compression fracture: No Abdominal aneurysm: No  COGNITION:  Overall cognitive status: Within functional limits for tasks assessed     SENSATION: WFL  MUSCLE LENGTH: Hamstrings limited by 25% bil.   POSTURE: rounded shoulders and forward head  PALPATION: Diffuse palpable tenderness over bil upper traps, rhomboids and cervical paraspinals.  Tender over lateral/distal achilles tendon.   LUMBAR ROM:  Cervical A/ROM: Full except Rt rotation limited by 25%.  Lt neck pain with all cervical A/ROM.  Bil shoulder A/ROM is full with pain on Lt with adduction and end range flexion with overpressure.   LOWER EXTREMITY ROM:   WFLs in bil ankles  LOWER EXTREMITY MMT:   Rt hip flexion 4-/5, other hip 4+/5 bil, knees 4+/5, ankles 4+/5  GAIT: Distance walked: 50 Assistive device utilized: None Level of assistance: Complete Independence Comments: WFLs  TODAY'S TREATMENT  Date: 04/13/22 Cervical A/ROM 3 ways 2x20 seconds  Shoulder flexion at wall x10 Seated hamstring stretch 3x 20 seconds  Open book x10 bil each Cat cow x10 Child's pose x3  Trigger Point Dry-Needling  Treatment instructions: Expect mild to moderate muscle soreness. S/S of pneumothorax if dry needled over a lung field, and to seek immediate medical attention should they occur. Patient verbalized understanding of these instructions and education.  Patient Consent Given: Yes Education  handout provided: Yes Muscles treated: bil cervical and upper thoracic (T1-3), upper traps, suboccipitals Treatment response/outcome: Utilized skilled palpation to identify trigger points.  During dry needling able to palpate muscle twitch and muscle elongation  Elongation to bil neck and upper traps  Skilled palpation and monitoring by PT during dry needling  Date: 04/06/22 HEP established-see below  Discussed aquatics and DN   PATIENT EDUCATION:  Education details: Access Code: [email or web address] Person educated: Patient Education method: Explanation, Demonstration, and Handouts Education comprehension: verbalized understanding and returned demonstration   HOME EXERCISE PROGRAM: Access Code: [email or web address] URL: [email or web address] Date: 04/13/2022 Prepared by: Claiborne Billings  Exercises - Seated Cervical Flexion AROM  - 3 x daily - 7 x weekly - 1 sets - 3 reps - 20 hold - Seated Cervical Sidebending AROM  - 3 x daily - 7 x weekly - 1 sets - 3 reps - 20 hold - Seated Cervical Rotation AROM  - 3 x daily - 7 x weekly - 1 sets - 3 reps - 20 hold - Seated Correct Posture  - 1 x daily - 7 x weekly - 3  sets - 10 reps - Shoulder Flexion Wall Slide with Towel  - 2 x daily - 7 x weekly - 1 sets - 10 reps - 10 hold - Seated Hamstring Stretch  - 3 x daily - 7 x weekly - 1 sets - 3 reps - 20 hold - Cat Cow  - 2 x daily - 7 x weekly - 1 sets - 10 reps - Child's Pose Stretch  - 2 x daily - 7 x weekly - 1 sets - 3 reps - 20 hold - Sidelying Open Book Thoracic Lumbar Rotation and Extension  - 2 x daily - 7 x weekly - 1 sets - 10 reps  ASSESSMENT:  CLINICAL IMPRESSION: First time follow-up after evaluation. Pt reports that she had soreness for a few days after she initiated the HEP and this has gotten better.  Pt demonstrated all aspects of HEP well.  PT added thoracic mobility to HEP. Pt has reduced segmental mobility in the thoracic spine and required tactile cues for thoracic extension and flexion in  cat/cow exercise.  Pt with trigger points in cervical and upper thoracic spine and had good response to DN with improved tissue mobility and reduced tension after manual therapy. Patient will benefit from skilled PT to address the below impairments and improve overall function.    OBJECTIVE IMPAIRMENTS decreased ROM, decreased strength, increased muscle spasms, impaired flexibility, impaired UE functional use, postural dysfunction, and pain.   ACTIVITY LIMITATIONS sitting, standing, stairs, and locomotion level  PARTICIPATION LIMITATIONS: cleaning, laundry, shopping, and community activity  PERSONAL FACTORS Time since onset of injury/illness/exacerbation and 1-2 comorbidities: fibromyalgia, depression, asthma  are also affecting patient's functional outcome.   REHAB POTENTIAL: Good  CLINICAL DECISION MAKING: Evolving/moderate complexity  EVALUATION COMPLEXITY: Moderate   GOALS: Goals reviewed with patient? Yes  SHORT TERM GOALS: Target date: 05/04/2022  Be independent in initial HEP Baseline: Goal status: INITIAL  2.  Reduce fibromyalgia impact score to < or to 32 to improve function Baseline:  Goal status: INITIAL  3.  Report > or = to 30% reduction in neck and ankle pain with daily tasks  Baseline:  Goal status: INITIAL    [place] TERM GOALS: Target date: 06/01/2022  Be independent in advanced HEP Baseline:  Goal status: INITIAL  2.  Reduce fibromyalgia impact score to < or = to 25 to improve function Baseline:  Goal status: INITIAL  3.  Report > or = to 50% reduction in neck and Lt ankle pain with ADLs and self-care Baseline:  Goal status: INITIAL  4.  Become independent in aquatic exercises for ability to exercise with less pain Baseline:  Goal status: INITIAL    PLAN: PT FREQUENCY: 1x/week  PT DURATION: 8 weeks  PLANNED INTERVENTIONS: Therapeutic exercises, Therapeutic activity, Neuromuscular re-education, Balance training, Gait training, Patient/Family  education, Self Care, Joint mobilization, Stair training, Aquatic Therapy, Dry Needling, Electrical stimulation, Spinal manipulation, Spinal mobilization, Cryotherapy, Moist heat, Taping, Vasopneumatic device, Manual therapy, and Re-evaluation.  PLAN FOR NEXT SESSION: alternate land and aquatics every other week.  DN and advancement of HEP when on land.    Lorrene Reid, PT 04/13/22 11:01 AM  [facility] [address] [place], [place] [address] Phone # [phone] Fax [phone]

## 2022-04-13 NOTE — Telephone Encounter (Signed)
Primary Cardiologist:Robert Agustin Cree, MD  Chart reviewed as part of pre-operative protocol coverage. Because of Megan Murray past medical history and time since last visit, he/she will require a follow-up visit in order to better assess preoperative cardiovascular risk. She has been seen by Pharm D for lipid management but is due for annual in-person office visit.   Pre-op covering staff: - Please schedule appointment and call patient to inform them. - Please contact requesting surgeon's office via preferred method (i.e, phone, fax) to inform them of need for appointment prior to surgery.  If applicable, this message will also be routed to pharmacy pool and/or primary cardiologist for input on holding anticoagulant/antiplatelet agent as requested below so that this information is available at time of patient's appointment.   Emmaline Life, NP-C  04/13/2022, 10:42 AM [address] Office [phone] Fax [phone]

## 2022-04-15 IMAGING — DX DG CHEST 1V PORT
1 series · 1 of 1 positions shown · non-contrast
Comparison: 06/16/2021.

CLINICAL DATA: sob

EXAM:
PORTABLE CHEST 1 VIEW

[chest ap]
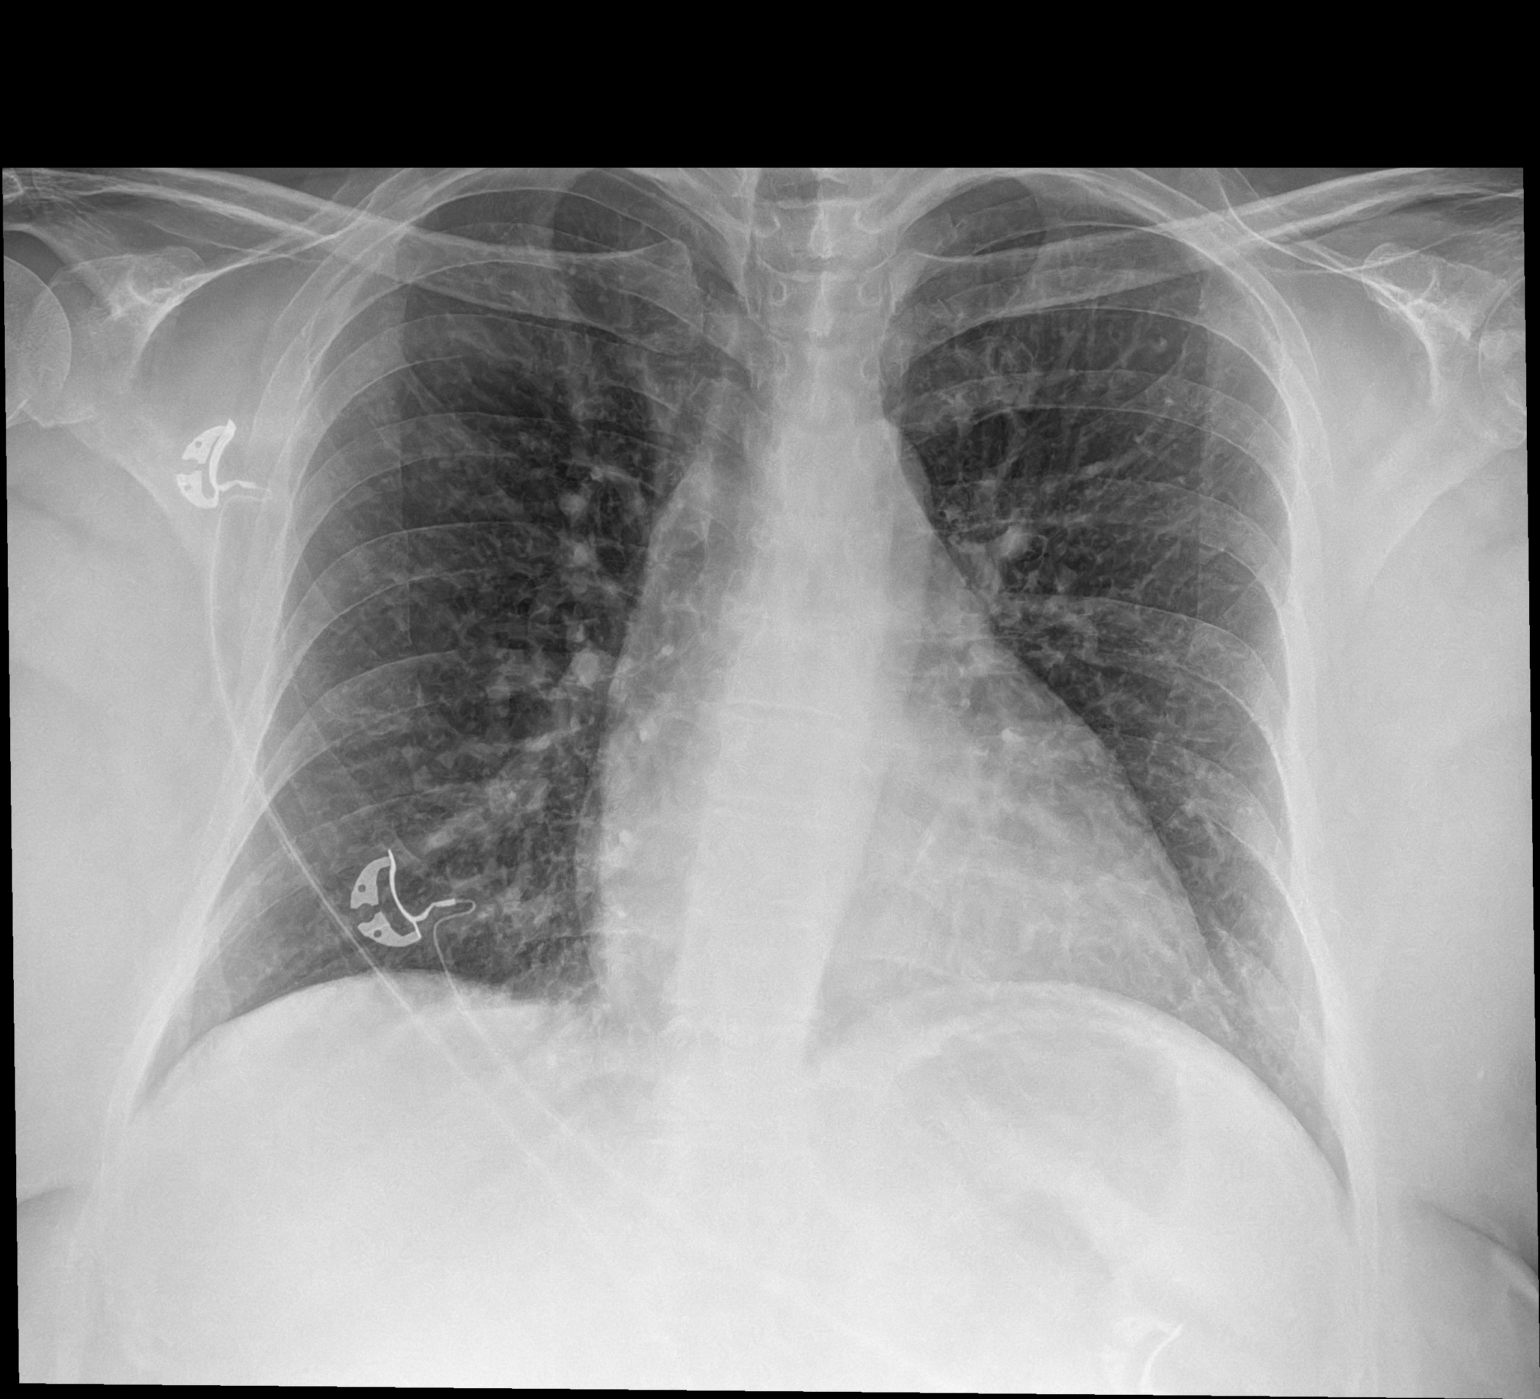

[1 of 1 positions shown; findings below may reference images not displayed]

FINDINGS: Chronic mild prominence of the lung markings. No consolidation. No
visible pleural effusions or pneumothorax. Cardiomediastinal
silhouette is similar.
IMPRESSION: No evidence of acute cardiopulmonary disease.

## 2022-04-15 NOTE — Telephone Encounter (Signed)
I will need to send a message to [facility] office scheduling team for appt  with Dr. Agustin Cree or APP for pre op clearance

## 2022-04-15 NOTE — Telephone Encounter (Signed)
Pt has been scheduled to see Dr. Geraldo Pitter 05/13/22 for pre op clearance.

## 2022-04-19 ENCOUNTER — Other Ambulatory Visit: Payer: Self-pay | Admitting: Medical

## 2022-04-20 ENCOUNTER — Ambulatory Visit (HOSPITAL_COMMUNITY)
Admission: RE | Admit: 2022-04-20 | Discharge: 2022-04-20 | Disposition: A | Discharge status: Home / Self Care | Payer: Medicare Other | Source: Ambulatory Visit | Attending: General Surgery | Admitting: General Surgery

## 2022-04-20 DIAGNOSIS — Z6835 Body mass index (BMI) 35.0-35.9, adult: Secondary | ICD-10-CM | POA: Insufficient documentation

## 2022-04-21 ENCOUNTER — Encounter: Payer: Self-pay | Admitting: Physical Therapy

## 2022-04-21 ENCOUNTER — Ambulatory Visit: Payer: Medicare Other | Admitting: Physical Therapy

## 2022-04-21 DIAGNOSIS — R252 Cramp and spasm: Secondary | ICD-10-CM

## 2022-04-21 DIAGNOSIS — M542 Cervicalgia: Secondary | ICD-10-CM

## 2022-04-21 DIAGNOSIS — M7918 Myalgia, other site: Secondary | ICD-10-CM

## 2022-04-21 DIAGNOSIS — M25572 Pain in left ankle and joints of left foot: Secondary | ICD-10-CM

## 2022-04-21 NOTE — Therapy (Addendum)
OUTPATIENT PHYSICAL THERAPY TREATMENT   Patient Name: Megan Murray MRN: [id number] DOB:[birth year], 56 y.o., female Today's Date: 04/21/2022   PT End of Session - 04/21/22 2129     Visit Number 3    Date for PT Re-Evaluation 06/01/22    Authorization Type [employer] Medicare    PT Start Time 0930    PT Stop Time 1015    PT Time Calculation (min) 45 min    Activity Tolerance Patient tolerated treatment well    Behavior During Therapy WFL for tasks assessed/performed               Past Medical History:  Diagnosis Date   Asthma    Depression    Diabetes mellitus without complication ([place])    Dyslipidemia, goal to be determined    High triglycerides and LDL   Fatty liver 05/16/2018   By CT scan   Fatty liver    Fibromyalgia    Gallbladder problem    Hyperlipidemia    Hypertension    Joint pain    Major depression, recurrent, chronic ([place]) 04/25/2018   Dr. Toy Care   Obesity due to excess calories without serious comorbidity    Osteoarthritis    Pancreatic disease    PCOS (polycystic ovarian syndrome)    SOBOE (shortness of breath on exertion)    Vitamin D deficiency    Past Surgical History:  Procedure Laterality Date   ABDOMINAL HYSTERECTOMY     [place]     Patient Active Problem List   Diagnosis Date Noted   Other fatigue 01/20/2022   SOBOE (shortness of breath on exertion) 01/20/2022   Vitamin D deficiency 01/20/2022   Other hyperlipidemia 01/20/2022   Depression 01/20/2022   DKA (diabetic ketoacidosis) ([place]) 09/27/2021   Prolonged QT interval 09/27/2021   COVID-19 05/14/2021   Coronary artery disease moderate based on coronary CT angio of circumflex 04/29/2021   Dyspnea on exertion 02/20/2021   Atypical chest pain 02/20/2021   Obesity due to excess calories without serious comorbidity 02/19/2021   Dyslipidemia, goal to be determined 02/19/2021   Diabetes mellitus ([place]) 02/19/2021    Reactive airway disease 02/19/2021   Myofascial pain 11/12/2019   Primary osteoarthritis of left knee 11/12/2019   Primary osteoarthritis of right knee 11/12/2019   Cough variant asthma 08/10/2019   Chronic fatigue 04/26/2019   Gastroesophageal reflux disease without esophagitis 04/26/2019   Statin intolerance 06/21/2018   Seasonal allergic rhinitis due to pollen 05/19/2018   Fatty liver 05/16/2018   Fibromyalgia 04/25/2018   Major depression, recurrent, chronic ([place]) 04/25/2018   Osteoarthritis, multiple sites 04/25/2018   Essential hypertension 11/09/2016   Type 2 diabetes mellitus without complication, without [place]-term current use of insulin ([place]) 11/09/2016   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   Mixed hyperlipidemia 11/04/2016   B12 deficiency 01/23/2015    PCP: Cline Crock  REFERRING PROVIDER: Clearance Coots, MD  REFERRING DIAG: M79.18 (ICD-10-CM) - Myofascial pain  Rationale for Evaluation and Treatment Rehabilitation  THERAPY DIAG:  Myofascial pain  Cervicalgia  Pain in left ankle and joints of left foot  Cramp and spasm  ONSET DATE: chronic with worsening over the past 3 months-history of fibromyalgia  SUBJECTIVE:  SUBJECTIVE STATEMENT: I woke up this AM and it is what I call a good day. PERTINENT HISTORY:  Depression, asthma, DM, fibromyalgia,   PAIN:  Are you having pain? Yes: NPRS scale: 3/10, up to 8/10 max  Pain location: Lt>Rt ankle,  Pain description: stiffness Aggravating factors: sitting for [place] periods and then getting back up, walking too [place], descending steps  Relieving factors: hot tub, stretching, massage  PRECAUTIONS: None  WEIGHT BEARING RESTRICTIONS No  FALLS:  Has patient fallen in last 6 months? No  LIVING ENVIRONMENT: Lives  with: lives with their family Lives in: House/apartment Stairs: Yes: External: 4 steps Has following equipment at home: None  OCCUPATION: no  PLOF: Independent and Leisure: walking 2-3 miles 3-4x/wk  PATIENT GOALS reduce myofascial pain, exercise with less pain, housework with less pain  OBJECTIVE:   DIAGNOSTIC FINDINGS:  None recent   PATIENT SURVEYS:  Fibromyalgia Impact questionnaire: 38   SCREENING FOR RED FLAGS: Bowel or bladder incontinence: No Spinal tumors: No Cauda equina syndrome: No Compression fracture: No Abdominal aneurysm: No  COGNITION:  Overall cognitive status: Within functional limits for tasks assessed     SENSATION: WFL  MUSCLE LENGTH: Hamstrings limited by 25% bil.   POSTURE: rounded shoulders and forward head  PALPATION: Diffuse palpable tenderness over bil upper traps, rhomboids and cervical paraspinals.  Tender over lateral/distal achilles tendon.   LUMBAR ROM:  Cervical A/ROM: Full except Rt rotation limited by 25%.  Lt neck pain with all cervical A/ROM.  Bil shoulder A/ROM is full with pain on Lt with adduction and end range flexion with overpressure.   LOWER EXTREMITY ROM:   WFLs in bil ankles  LOWER EXTREMITY MMT:   Rt hip flexion 4-/5, other hip 4+/5 bil, knees 4+/5, ankles 4+/5  GAIT: Distance walked: 50 Assistive device utilized: None Level of assistance: Complete Independence Comments: WFLs  TODAY'S TREATMENT    04/21/22:Pt arrives for aquatic physical therapy. Treatment took place in 3.5-5.5 feet of water. Water temperature was 92 degrees F. Pt entered the pool via stairs, with caution reciprocally. Pt requires buoyancy of water for support and to offload joints with strengthening exercises.   Seated water bench with 75% submersion Pt performed seated LE AROM exercises 20x in all planes, concurrent education of water principles, suggestions on how to get in/out of low car, and paying attention to how often she stands with  her knees locked. Pt verbally understood and agreed. Semi sit on bench: shoulder flex/ext 2x10, lat/core press with blue noodle 10x VC to contract glutes.  Standing in 50%- 75% depth water pt performed water walking in all 4 directions 6x. Standing hamstring, Rt hip flexor and adductor stretch BIl. 5 min seated decompression hang with yellow noodle. Maunal soft tissue work Bil gastroc RT >LT.  Date: 04/13/22 Cervical A/ROM 3 ways 2x20 seconds  Shoulder flexion at wall x10 Seated hamstring stretch 3x 20 seconds  Open book x10 bil each Cat cow x10 Child's pose x3  Trigger Point Dry-Needling  Treatment instructions: Expect mild to moderate muscle soreness. S/S of pneumothorax if dry needled over a lung field, and to seek immediate medical attention should they occur. Patient verbalized understanding of these instructions and education.  Patient Consent Given: Yes Education handout provided: Yes Muscles treated: bil cervical and upper thoracic (T1-3), upper traps, suboccipitals Treatment response/outcome: Utilized skilled palpation to identify trigger points.  During dry needling able to palpate muscle twitch and muscle elongation  Elongation to bil neck and upper traps  Skilled palpation and monitoring by PT during dry needling  Date: 04/06/22 HEP established-see below  Discussed aquatics and DN   PATIENT EDUCATION:  Education details: Access Code: [email or web address] Person educated: Patient Education method: Explanation, Demonstration, and Handouts Education comprehension: verbalized understanding and returned demonstration   HOME EXERCISE PROGRAM: Access Code: [email or web address] URL: [email or web address] Date: 04/13/2022 Prepared by: Claiborne Billings  Exercises - Seated Cervical Flexion AROM  - 3 x daily - 7 x weekly - 1 sets - 3 reps - 20 hold - Seated Cervical Sidebending AROM  - 3 x daily - 7 x weekly - 1 sets - 3 reps - 20 hold - Seated Cervical Rotation AROM  - 3 x daily - 7 x weekly - 1  sets - 3 reps - 20 hold - Seated Correct Posture  - 1 x daily - 7 x weekly - 3 sets - 10 reps - Shoulder Flexion Wall Slide with Towel  - 2 x daily - 7 x weekly - 1 sets - 10 reps - 10 hold - Seated Hamstring Stretch  - 3 x daily - 7 x weekly - 1 sets - 3 reps - 20 hold - Cat Cow  - 2 x daily - 7 x weekly - 1 sets - 10 reps - Child's Pose Stretch  - 2 x daily - 7 x weekly - 1 sets - 3 reps - 20 hold - Sidelying Open Book Thoracic Lumbar Rotation and Extension  - 2 x daily - 7 x weekly - 1 sets - 10 reps  ASSESSMENT:  CLINICAL IMPRESSION: Pt arrives for first aquatic session. Pt was educated in water principles, how to get in/out of low car, and how to stretch her Rt hip flexor prior to climbing stairs. Pt had no pain while exercising in the water.   OBJECTIVE IMPAIRMENTS decreased ROM, decreased strength, increased muscle spasms, impaired flexibility, impaired UE functional use, postural dysfunction, and pain.   ACTIVITY LIMITATIONS sitting, standing, stairs, and locomotion level  PARTICIPATION LIMITATIONS: cleaning, laundry, shopping, and community activity  PERSONAL FACTORS Time since onset of injury/illness/exacerbation and 1-2 comorbidities: fibromyalgia, depression, asthma  are also affecting patient's functional outcome.   REHAB POTENTIAL: Good  CLINICAL DECISION MAKING: Evolving/moderate complexity  EVALUATION COMPLEXITY: Moderate   GOALS: Goals reviewed with patient? Yes  SHORT TERM GOALS: Target date: 05/04/2022  Be independent in initial HEP Baseline: Goal status: INITIAL  2.  Reduce fibromyalgia impact score to < or to 32 to improve function Baseline:  Goal status: INITIAL  3.  Report > or = to 30% reduction in neck and ankle pain with daily tasks  Baseline:  Goal status: INITIAL    [place] TERM GOALS: Target date: 06/01/2022  Be independent in advanced HEP Baseline:  Goal status: INITIAL  2.  Reduce fibromyalgia impact score to < or = to 25 to improve  function Baseline:  Goal status: INITIAL  3.  Report > or = to 50% reduction in neck and Lt ankle pain with ADLs and self-care Baseline:  Goal status: INITIAL  4.  Become independent in aquatic exercises for ability to exercise with less pain Baseline:  Goal status: INITIAL    PLAN: PT FREQUENCY: 1x/week  PT DURATION: 8 weeks  PLANNED INTERVENTIONS: Therapeutic exercises, Therapeutic activity, Neuromuscular re-education, Balance training, Gait training, Patient/Family education, Self Care, Joint mobilization, Stair training, Aquatic Therapy, Dry Needling, Electrical stimulation, Spinal manipulation, Spinal mobilization, Cryotherapy, Moist heat, Taping, Vasopneumatic device, Manual therapy, and Re-evaluation.  PLAN FOR NEXT SESSION: alternate land  and aquatics every other week.  DN and advancement of HEP when on land.   Myrene Galas, PTA 04/21/22 9:41 PM   04/21/22 9:31 PM   PHYSICAL THERAPY DISCHARGE SUMMARY  Visits from Start of Care: 3  Current functional level related to goals / functional outcomes: Pt called to cancel all remaining appts.  She stated that the water for aquatic PT is too cold for her.     Remaining deficits: See above for current PT status.     Education / Equipment: HEP   Patient agrees to discharge. Patient goals were not met. Patient is being discharged due to the patient's request.  Sigurd Sos, PT 05/03/22 4:04 PM   [facility] [address] [place], [place] [address] Phone # [phone] Fax [phone]

## 2022-04-22 ENCOUNTER — Encounter: Payer: Medicare Other | Attending: General Surgery | Admitting: Dietician

## 2022-04-22 ENCOUNTER — Encounter: Payer: Self-pay | Admitting: Dietician

## 2022-04-22 DIAGNOSIS — Z6841 Body Mass Index (BMI) 40.0 and over, adult: Secondary | ICD-10-CM | POA: Insufficient documentation

## 2022-04-22 DIAGNOSIS — E669 Obesity, unspecified: Secondary | ICD-10-CM

## 2022-04-22 DIAGNOSIS — E785 Hyperlipidemia, unspecified: Secondary | ICD-10-CM | POA: Insufficient documentation

## 2022-04-22 DIAGNOSIS — E119 Type 2 diabetes mellitus without complications: Secondary | ICD-10-CM

## 2022-04-22 DIAGNOSIS — F32A Depression, unspecified: Secondary | ICD-10-CM | POA: Insufficient documentation

## 2022-04-22 DIAGNOSIS — M1990 Unspecified osteoarthritis, unspecified site: Secondary | ICD-10-CM | POA: Insufficient documentation

## 2022-04-22 DIAGNOSIS — I10 Essential (primary) hypertension: Secondary | ICD-10-CM | POA: Insufficient documentation

## 2022-04-22 DIAGNOSIS — Z713 Dietary counseling and surveillance: Secondary | ICD-10-CM | POA: Diagnosis not present

## 2022-04-22 DIAGNOSIS — J45909 Unspecified asthma, uncomplicated: Secondary | ICD-10-CM | POA: Diagnosis not present

## 2022-04-22 NOTE — Progress Notes (Signed)
Nutrition Assessment for Bariatric Surgery Medical Nutrition Therapy Appt Start Time: 1:55    End Time: 2:54  Patient was seen on 04/22/2022 for Pre-Operative Nutrition Assessment. Letter of approval faxed to [facility] bariatric surgery program coordinator on 04/22/2022.   Referral stated Supervised Weight Loss (SWL) visits needed: 6 months  Planned surgery: RYGB Pt expectation of surgery: better quality of life; wt loss would be a bonus    NUTRITION ASSESSMENT   Anthropometrics  Start weight at [facility]: 213.0 lbs (date: 04/22/2022)  Height: 61 in BMI: 40.25 kg/m2     Clinical  Medical hx: arthritis, asthma, depression, DM, hyperlipidemia, HTN, nerve/muscle disease Medications: see list  Labs: vit D 16.78 Notable signs/symptoms: nothing noted Any previous deficiencies? No  Micronutrient Nutrition Focused Physical Exam: Hair: No issues observed Eyes: No issues observed Mouth: No issues observed Neck: No issues observed Nails: No issues observed Skin: No issues observed  Lifestyle & Dietary Hx  Patient lives with husband. The pt performs the food shopping and the pt prepares the meals. She reports that she typically skips or misses 4 out of 21 possible meals per week. She may have 2 meals per week that are take-out or at a restaurant.  Patient works as a Futures trader. She denies binge eating and denies feeling shame and/or guilt after eating too much food.  She denies having used laxatives or vomiting to facilitate weight loss. She denies emotional eating during times of stress. She states that she knows the difference between hunger and thirst and can tell when she is full. Pt states she takes Prozac, and pt states she has not been comfortable in her own body, and states that is where any depression is coming from.  Pt states she has a CGM for monitoring glucose. Pt states she was hospitalized in March 2023 and was put on insulin then, stating since then she has gained  weight from 180 lbs, which was her normal weight for several decades. Pt states she was put on Ozempic and Synjardy shortly before the hospitalization.  Pt states that the doctors think the medications caused her reactions. Diagnosed with diabetes for 11 years and her A1C was never was over a 7, stating that it was 10.8 A1C in hospital in March and she has now gotten it down to 6.9.  Pt states the only carbohydrate she eats is the little amount in non-starchy vegetables.  Pt states she takes 60 unit of insulin in the am and 60 units in the pm. Pt states sucralose will raise her blood sugar, just like sugar.  Physical Activity: Walking 30 minutes 3-4 days a week, pool physical therapy once every other week  Sleep Hygiene: duration and quality: pt states she has insomnia, takes Ambien, states she gets about 6 hours a night.  Current Patient Perceived Stress Level as stated by pt on a scale of 1-10:  5      Stress Management Techniques: walking  Fruit servings per week: 0 Non starchy vegetable servings per week: 14+ Whole Grains per week: 0   24-Hr Dietary Recall First Meal: eggs (2), mushrooms Snack:  Second Meal: leftovers protein, non starchy vegetable or salad Snack: protein shake Third Meal: protein, non starchy vegetable or salad Snack:  Beverages: water (80 oz/day), coke zero  Alcoholic beverages per week: 0   Estimated Energy Needs Calories: 1500   NUTRITION DIAGNOSIS  Overweight/obesity ([place]-3.3) related to past poor dietary habits and physical inactivity as evidenced by patient w/ planned RYGB  surgery following dietary guidelines for continued weight loss.    NUTRITION INTERVENTION  Nutrition counseling (C-1) and education (E-2) to facilitate bariatric surgery goals.  Educated pt on micronutrient deficiencies post surgery and strategies to mitigate that risk   Pre-Op Goals Reviewed with the Patient Track food and beverage intake (pen and paper, MyFitness Pal, Baritastic  app, etc.) Make healthy food choices while monitoring portion sizes Consume 3 meals per day or try to eat every 3-5 hours Avoid concentrated sugars and fried foods Keep sugar & fat in the single digits per serving on food labels Practice CHEWING your food (aim for applesauce consistency) Practice not drinking 15 minutes before, during, and 30 minutes after each meal and snack Avoid all carbonated beverages (ex: soda, sparkling beverages)  Limit caffeinated beverages (ex: coffee, tea, energy drinks) Avoid all sugar-sweetened beverages (ex: regular soda, sports drinks)  Avoid alcohol  Aim for 64-100 ounces of FLUID daily (with at least half of fluid intake being plain water)  Aim for at least 60-80 grams of PROTEIN daily Look for a liquid protein source that contains ?15 g protein and ?5 g carbohydrate (ex: shakes, drinks, shots) Make a list of non-food related activities Physical activity is an important part of a healthy lifestyle so keep it moving! The goal is to reach 150 minutes of exercise per week, including cardiovascular and weight baring activity.  *Goals that are bolded indicate the pt would like to start working towards these  Handouts Provided Include  Bariatric Surgery handouts (Nutrition Visits, Pre-Op Goals, Protein Shakes, Vitamins & Minerals)  Learning Style & Readiness for Change Teaching method utilized: Visual & Auditory  Demonstrated degree of understanding via: Teach Back  Readiness Level: preparation Barriers to learning/adherence to lifestyle change: none identified  RD's Notes for Next Visit Progress toward patient's chosen goals.     MONITORING & EVALUATION Dietary intake, weekly physical activity, body weight, and pre-op goals reached at next nutrition visit.    Next Steps  Patient is to follow up at [facility] next month for SWL visit.

## 2022-04-23 ENCOUNTER — Other Ambulatory Visit (INDEPENDENT_AMBULATORY_CARE_PROVIDER_SITE_OTHER): Payer: Medicare Other

## 2022-04-23 ENCOUNTER — Other Ambulatory Visit (HOSPITAL_BASED_OUTPATIENT_CLINIC_OR_DEPARTMENT_OTHER): Payer: Self-pay

## 2022-04-23 DIAGNOSIS — E559 Vitamin D deficiency, unspecified: Secondary | ICD-10-CM

## 2022-04-23 LAB — VITAMIN D 25 HYDROXY (VIT D DEFICIENCY, FRACTURES): VITD: 30.85 ng/mL (ref 30.00–100.00)

## 2022-04-23 MED ORDER — FLUARIX QUADRIVALENT 0.5 ML IM SUSY
PREFILLED_SYRINGE | INTRAMUSCULAR | 0 refills | Status: DC
  Filled 2022-04-23: qty 0.5, 1d supply, fill #0

## 2022-04-24 MED ORDER — VITAMIN D (ERGOCALCIFEROL) 1.25 MG (50000 UNIT) PO CAPS: 50000.0000 [IU] | ORAL_CAPSULE | ORAL | 0 refills | Status: DC

## 2022-04-24 NOTE — Addendum Note (Signed)
Addended by: Anabel Halon on: 04/24/2022 10:40 AM   Modules accepted: Orders

## 2022-04-27 ENCOUNTER — Ambulatory Visit: Payer: Medicare Other

## 2022-05-02 ENCOUNTER — Other Ambulatory Visit: Payer: Self-pay | Admitting: Medical

## 2022-05-05 ENCOUNTER — Ambulatory Visit: Payer: Medicare Other | Admitting: Physical Therapy

## 2022-05-05 ENCOUNTER — Other Ambulatory Visit: Payer: Self-pay

## 2022-05-05 DIAGNOSIS — M2550 Pain in unspecified joint: Secondary | ICD-10-CM | POA: Insufficient documentation

## 2022-05-05 DIAGNOSIS — E282 Polycystic ovarian syndrome: Secondary | ICD-10-CM | POA: Insufficient documentation

## 2022-05-05 DIAGNOSIS — E785 Hyperlipidemia, unspecified: Secondary | ICD-10-CM | POA: Insufficient documentation

## 2022-05-05 DIAGNOSIS — K869 Disease of pancreas, unspecified: Secondary | ICD-10-CM | POA: Insufficient documentation

## 2022-05-05 DIAGNOSIS — K829 Disease of gallbladder, unspecified: Secondary | ICD-10-CM | POA: Insufficient documentation

## 2022-05-05 DIAGNOSIS — J45909 Unspecified asthma, uncomplicated: Secondary | ICD-10-CM | POA: Insufficient documentation

## 2022-05-05 DIAGNOSIS — E119 Type 2 diabetes mellitus without complications: Secondary | ICD-10-CM | POA: Insufficient documentation

## 2022-05-06 ENCOUNTER — Encounter: Payer: Self-pay | Admitting: Dietician

## 2022-05-06 ENCOUNTER — Encounter: Payer: Medicare Other | Admitting: Dietician

## 2022-05-06 DIAGNOSIS — E119 Type 2 diabetes mellitus without complications: Secondary | ICD-10-CM

## 2022-05-06 DIAGNOSIS — E669 Obesity, unspecified: Secondary | ICD-10-CM | POA: Diagnosis not present

## 2022-05-06 NOTE — Progress Notes (Signed)
Supervised Weight Loss Visit Bariatric Nutrition Education Appt Start Time: 10:35    End Time: 11:05  Planned surgery: RYGB Pt expectation of surgery: better quality of life; wt loss would be a bonus  1 out of 4 SWL Appointments   NUTRITION ASSESSMENT  Anthropometrics  Start weight at [facility]: 213.0 lbs (date: 04/22/2022)  Height: 61 in Weight today: 214.7 lbs BMI: 40.57 kg/m2     Clinical  Medical hx: arthritis, asthma, depression, DM, hyperlipidemia, HTN, nerve/muscle disease Medications: see list  Labs: vit D 30.85;  Notable signs/symptoms: nothing noted Any previous deficiencies? No  Lifestyle & Dietary Hx  Pt states not drinking with meals has been difficult, stating she is going to have to just not have it at the table and continue to practice. Pt  states she has not had carbonated beverages since the last visit. Pt self reports last A1C with her endocrinologist is 6.9 (03/31/2022) Pt state she can not have Sucralose, stating it raises her blood sugar just like sugar. Pt states with her fiber myalgia and facial pain, she is limited to the type of physical activity she can do. Pt states she has her family come for dinner 3-4 times a week for dinner.   Estimated daily fluid intake: 80 oz Supplements: Vit D Current average weekly physical activity: Walking 30 minutes 3-4 days a week; no more pool therapy, stating it was very cold and her bones hurt all day.  24-Hr Dietary Recall First Meal: 2 eggs (boiled or scrambled) Snack: protein shake, cucumbers or peppers Second Meal: deli meat and cheese wrap or salad or left overs Snack:  Third Meal: protein/meat and non-starchy vegetables Snack: sometimes a protein shake  Beverages: protein water with Stevia, water  Estimated Energy Needs Calories: 1500   NUTRITION DIAGNOSIS  Overweight/obesity ([place]-3.3) related to past poor dietary habits and physical inactivity as evidenced by patient w/ planned RYGB surgery following  dietary guidelines for continued weight loss.   NUTRITION INTERVENTION  Nutrition counseling (C-1) and education (E-2) to facilitate bariatric surgery goals.   Pre-Op Goals Reviewed with the Patient Track food and beverage intake (pen and paper, MyFitness Pal, Baritastic app, etc.) Make healthy food choices while monitoring portion sizes Consume 3 meals per day or try to eat every 3-5 hours Avoid concentrated sugars and fried foods Keep sugar & fat in the single digits per serving on food labels Practice CHEWING your food (aim for applesauce consistency) Practice not drinking 15 minutes before, during, and 30 minutes after each meal and snack Avoid all carbonated beverages (ex: soda, sparkling beverages)  Limit caffeinated beverages (ex: coffee, tea, energy drinks) Avoid all sugar-sweetened beverages (ex: regular soda, sports drinks)  Avoid alcohol  Aim for 64-100 ounces of FLUID daily (with at least half of fluid intake being plain water)  Aim for at least 60-80 grams of PROTEIN daily Look for a liquid protein source that contains ?15 g protein and ?5 g carbohydrate (ex: shakes, drinks, shots) Make a list of non-food related activities Physical activity is an important part of a healthy lifestyle so keep it moving! The goal is to reach 150 minutes of exercise per week, including cardiovascular and weight baring activity.  *Goals that are bolded indicate the pt would like to start working towards these  Pre-Op Goals Progress & New Goals Continue: to practice not drinking with meals Continue: not drinking sodas/carbonated beverages New: Add one more day of physical activity per week New: Track protein intake per day  Handouts  Provided Include  Protein Foods List to estimate daily protein intake  Learning Style & Readiness for Change Teaching method utilized: Visual & Auditory  Demonstrated degree of understanding via: Teach Back  Readiness Level: Action Barriers to  learning/adherence to lifestyle change: none identified  RD's Notes for next Visit  Progress toward patient's chosen goals   MONITORING & EVALUATION Dietary intake, weekly physical activity, body weight, and pre-op goals in 1 month.   Next Steps  Patient is to return to [facility] in one month for next SWL visit.

## 2022-05-13 ENCOUNTER — Ambulatory Visit: Payer: Medicare Other | Attending: Cardiology | Admitting: Cardiology

## 2022-05-13 ENCOUNTER — Encounter: Payer: Self-pay | Admitting: Cardiology

## 2022-05-13 ENCOUNTER — Encounter (HOSPITAL_COMMUNITY): Payer: Self-pay | Admitting: Cardiology

## 2022-05-13 VITALS — BP 166/68 | HR 75 | Ht 61.0 in | Wt 214.0 lb

## 2022-05-13 DIAGNOSIS — E782 Mixed hyperlipidemia: Secondary | ICD-10-CM

## 2022-05-13 DIAGNOSIS — I2510 Atherosclerotic heart disease of native coronary artery without angina pectoris: Secondary | ICD-10-CM

## 2022-05-13 DIAGNOSIS — I10 Essential (primary) hypertension: Secondary | ICD-10-CM | POA: Diagnosis not present

## 2022-05-13 DIAGNOSIS — Z789 Other specified health status: Secondary | ICD-10-CM | POA: Diagnosis not present

## 2022-05-13 DIAGNOSIS — Z01810 Encounter for preprocedural cardiovascular examination: Secondary | ICD-10-CM

## 2022-05-13 DIAGNOSIS — E781 Pure hyperglyceridemia: Secondary | ICD-10-CM

## 2022-05-13 DIAGNOSIS — E119 Type 2 diabetes mellitus without complications: Secondary | ICD-10-CM | POA: Diagnosis not present

## 2022-05-13 NOTE — Progress Notes (Signed)
Cardiology Office Note:    Date:  05/13/2022   ID:  [name], DOB [birth year], MRN [id number]  PCP:  Esperanza Richters, PA-C  Cardiologist:  Garwin Brothers, MD   Referring MD: Esperanza Richters, PA-C    ASSESSMENT:    1. Essential hypertension   2. Coronary artery disease involving native coronary artery of native heart without angina pectoris   3. Type 2 diabetes mellitus without complication, without Gearing-term current use of insulin (HCC)   4. Statin intolerance   5. Mixed hyperlipidemia   6. Class 3 severe obesity due to excess calories without serious comorbidity in adult, unspecified BMI (HCC)   7. Familial hypertriglyceridemia   8. Preoperative cardiovascular examination    PLAN:    In order of problems listed above:  Coronary artery disease and preoperative cardiovascular evaluation: Secondary prevention stressed with patient.  Importance of compliance with diet and medication stressed and she vocalized understanding.  I would like her to do exercise stress Cardiolite for preoperative stratification.  If the test is negative she is not at high risk for coronary events during the aforementioned surgery.  Meticulous hemodynamic monitoring will further reduce risk of coronary events. Essential hypertension: Blood pressure stable and diet was emphasized.  Lifestyle modification urged.  Patient's blood pressure is fine at home and she keeps a track of it.  She has an element of whitecoat hypertension. Mixed dyslipidemia diabetes mellitus and obesity: Lifestyle modification urged and she promises to do better.  She also tells me that she was off multiple medications because of pancreatitis.  Weight reduction stressed risks of obesity explained and she is very focused on risk factor modification. Patient will be seen in follow-up appointment in 6 months or earlier if the patient has any concerns    Medication Adjustments/Labs and Tests Ordered: Current medicines are reviewed at  length with the patient today.  Concerns regarding medicines are outlined above.  No orders of the defined types were placed in this encounter.  No orders of the defined types were placed in this encounter.    No chief complaint on file.    History of Present Illness:    Megan Murray is a 56 y.o. female.  Patient has past medical history of coronary artery disease diagnosed by CT coronary angiography.  Unfortunately an FFR was not done for technical reasons.  She has history of essential hypertension dyslipidemia and diabetes mellitus.  She underwent treatment for pancreatitis when she was taking weight loss medications.  Currently she wants to undergo bariatric surgery for weight loss.  She tells me that she walks on a regular basis.  Past Medical History:  Diagnosis Date   Asthma    Atypical chest pain 02/20/2021   B12 deficiency 01/23/2015   Chronic fatigue 04/26/2019   Coronary artery disease moderate based on coronary CT angio of circumflex 04/29/2021   Cough variant asthma 08/10/2019   COVID-19 05/14/2021   Depression    Diabetes mellitus (HCC) 02/19/2021   Diabetes mellitus without complication (HCC)    DKA (diabetic ketoacidosis) (HCC) 09/27/2021   Dyslipidemia, goal to be determined    High triglycerides and LDL   Dyspnea on exertion 0/97/3532   Essential hypertension 11/09/2016   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   Fatty liver 05/16/2018   By CT scan   Fatty liver    Fibromyalgia    Gallbladder problem    Gastroesophageal reflux disease without esophagitis 04/26/2019   Hyperlipidemia    Joint pain  Major depression, recurrent, chronic (HCC) 04/25/2018   Dr. Evelene Croon   Mixed hyperlipidemia 11/04/2016   Failed statins; hyperglycemia with repatha. Stopped 06/2018    Myofascial pain 11/12/2019   Obesity due to excess calories without serious comorbidity    Osteoarthritis, multiple sites 04/25/2018   Other fatigue 01/20/2022   Other hyperlipidemia 01/20/2022   Pancreatic  disease    PCOS (polycystic ovarian syndrome)    Primary osteoarthritis of left knee 11/12/2019   Primary osteoarthritis of right knee 11/12/2019   Prolonged QT interval 09/27/2021   Reactive airway disease 02/19/2021   Seasonal allergic rhinitis due to pollen 05/19/2018   SOBOE (shortness of breath on exertion)    Statin intolerance 06/21/2018   Type 2 diabetes mellitus without complication, without Moilanen-term current use of insulin (HCC) 11/09/2016   Vitamin D deficiency     Past Surgical History:  Procedure Laterality Date   ABDOMINAL HYSTERECTOMY     CESAREAN SECTION  1994   GALLBLADDER SURGERY  1995   REDUCTION MAMMAPLASTY      Current Medications: Current Meds  Medication Sig   albuterol (VENTOLIN HFA) 108 (90 Base) MCG/ACT inhaler Inhale 2 puffs into the lungs every 6 (six) hours as needed for wheezing or shortness of breath.   aspirin EC 81 MG tablet Take 1 tablet (81 mg total) by mouth daily. Swallow whole.   Bempedoic Acid-Ezetimibe (NEXLIZET) 180-10 MG TABS Take 1 tablet by mouth daily.   Continuous Blood Gluc Receiver (FREESTYLE LIBRE 2 READER) DEVI Use as directed   Continuous Blood Gluc Sensor (FREESTYLE LIBRE 2 SENSOR) MISC USE AS DIRECTED TO TEST BLOOD SUGAR AS DIRECTED BY MD   FLUoxetine (PROZAC) 40 MG capsule TAKE 1 CAPSULE(40 MG) BY MOUTH DAILY   fluticasone furoate-vilanterol (BREO ELLIPTA) 200-25 MCG/ACT AEPB Inhale 1 puff into the lungs daily.   glucose blood test strip Use as instructed: One touch Ultra E11.9   ibuprofen (ADVIL) 600 MG tablet Take 1 tablet by mouth every 6 (six) hours as needed for headache or pain.   influenza vac split quadrivalent PF (FLUARIX QUADRIVALENT) 0.5 ML injection Inject into the muscle.   insulin lispro (HUMALOG KWIKPEN) 100 UNIT/ML KwikPen Inject 6 Units into the skin 3 (three) times daily.   losartan (COZAAR) 50 MG tablet TAKE 1 TABLET(50 MG) BY MOUTH DAILY   ofloxacin (OCUFLOX) 0.3 % ophthalmic solution Place 1 drop into the right eye  4 (four) times daily.   pantoprazole (PROTONIX) 40 MG tablet Take 1 tablet (40 mg total) by mouth daily.   traMADol (ULTRAM) 50 MG tablet TAKE 1 TABLET(50 MG) BY MOUTH EVERY 8 HOURS FOR UP TO 5 DAYS AS NEEDED   TRESIBA FLEXTOUCH 200 UNIT/ML FlexTouch Pen Inject 50 Units into the skin 2 (two) times daily.   Vitamin D, Ergocalciferol, (DRISDOL) 1.25 MG (50000 UNIT) CAPS capsule Take 1 capsule (50,000 Units total) by mouth every 7 (seven) days.   zolpidem (AMBIEN) 10 MG tablet 1 tab po q hs prn insomnia.     Allergies:   Codeine, Atorvastatin, Cholestyramine, Crestor [rosuvastatin calcium], Fenofibrate, Simvastatin, Soy allergy, Statins, Dulaglutide, Empagliflozin-metformin hcl, Ozempic (0.25 or 0.5 mg-dose) [semaglutide(0.25 or 0.5mg -dos)], Repatha [evolocumab], and Meloxicam   Social History   Socioeconomic History   Marital status: Married    Spouse name: Vernell Barrier   Number of children: 1   Years of education: Not on file   Highest education level: Not on file  Occupational History   Occupation: disabled due to fibromyalgia and depression  Tobacco Use   Smoking status: Never   Smokeless tobacco: Never  Vaping Use   Vaping Use: Never used  Substance and Sexual Activity   Alcohol use: No   Drug use: No   Sexual activity: Yes  Other Topics Concern   Not on file  Social History Narrative   Not on file   Social Determinants of Health   Financial Resource Strain: Low Risk  (09/15/2021)   Overall Financial Resource Strain (CARDIA)    Difficulty of Paying Living Expenses: Not hard at all  Food Insecurity: No Food Insecurity (09/15/2021)   Hunger Vital Sign    Worried About Running Out of Food in the Last Year: Never true    Ran Out of Food in the Last Year: Never true  Transportation Needs: No Transportation Needs (09/15/2021)   PRAPARE - Hydrologist (Medical): No    Lack of Transportation (Non-Medical): No  Physical Activity: Not on file  Stress: No  Stress Concern Present (09/15/2021)   [place]    Feeling of Stress : Not at all  Social Connections: Moderately Integrated (09/15/2021)   Social Connection and Isolation Panel [NHANES]    Frequency of Communication with Friends and Family: More than three times a week    Frequency of Social Gatherings with Friends and Family: Twice a week    Attends Religious Services: More than 4 times per year    Active Member of [employer] or Organizations: No    Attends Music therapist: Never    Marital Status: Married     Family History: The patient's family history includes Arthritis in her maternal grandmother; COPD in her mother; Cancer in her paternal grandmother; Cirrhosis in her father; Depression in her brother and mother; Diabetes in her brother, maternal grandfather, and maternal grandmother; Drug abuse in her father; Esophageal cancer in her father; Healthy in her sister; Hearing loss in her paternal grandmother; Heart attack in her brother; Heart disease in her brother and mother; Heart failure in her mother; Hypertension in her mother; Liver disease in her father and paternal grandfather.  ROS:   Please see the history of present illness.    All other systems reviewed and are negative.  EKGs/Labs/Other Studies Reviewed:    The following studies were reviewed today: EKG reveals sinus rhythm inferior wall myocardial infarction of undetermined age.   Recent Labs: 09/29/2021: Magnesium 1.9 02/12/2022: Hemoglobin 14.9; Platelets 223 02/22/2022: TSH 1.29 02/25/2022: ALT 15; BUN 11; Creatinine, Ser 0.46; Potassium 4.3; Sodium 138  Recent Lipid Panel    Component Value Date/Time   CHOL 238 (H) 01/20/2022 1226   TRIG 194 (H) 01/20/2022 1226   HDL 44 01/20/2022 1226   CHOLHDL 5.4 (H) 01/20/2022 1226   CHOLHDL 7 09/21/2021 1232   VLDL 70.6 (H) 02/02/2021 1108   LDLCALC 158 (H) 01/20/2022 1226   LDLDIRECT 105.0  09/21/2021 1232    Physical Exam:    VS:  BP (!) 166/68   Pulse 75   Ht 5\' 1"  (1.549 m)   Wt 214 lb (97.1 kg)   SpO2 97%   BMI 40.43 kg/m     Wt Readings from Last 3 Encounters:  05/13/22 214 lb (97.1 kg)  05/06/22 214 lb 11.2 oz (97.4 kg)  04/22/22 213 lb (96.6 kg)     GEN: Patient is in no acute distress HEENT: Normal NECK: No JVD; No carotid bruits LYMPHATICS: No lymphadenopathy CARDIAC: Hear  sounds regular, 2/6 systolic murmur at the apex. RESPIRATORY:  Clear to auscultation without rales, wheezing or rhonchi  ABDOMEN: Soft, non-tender, non-distended MUSCULOSKELETAL:  No edema; No deformity  SKIN: Warm and dry NEUROLOGIC:  Alert and oriented x 3 PSYCHIATRIC:  Normal affect   Signed, Garwin Brothers, MD  05/13/2022 3:29 PM    [facility]

## 2022-05-13 NOTE — Patient Instructions (Signed)
Medication Instructions:  Your physician recommends that you continue on your current medications as directed. Please refer to the Current Medication list given to you today.  *If you need a refill on your cardiac medications before your next appointment, please call your pharmacy*   Lab Work: None ordered If you have labs (blood work) drawn today and your tests are completely normal, you will receive your results only by: MyChart Message (if you have MyChart) OR A paper copy in the mail If you have any lab test that is abnormal or we need to change your treatment, we will call you to review the results.   Testing/Procedures: Your physician has requested that you have a lexiscan myoview. For further information please visit [email or web address]. Please follow instruction sheet, as given.  The test will take approximately 3 to 4 hours to complete; you may bring reading material.  If someone comes with you to your appointment, they will need to remain in the main lobby due to limited space in the testing area. **If you are pregnant or breastfeeding, please notify the nuclear lab prior to your appointment**  How to prepare for your Myocardial Perfusion Test: Do not eat or drink 3 hours prior to your test, except you may have water. Do not consume products containing caffeine (regular or decaffeinated) 12 hours prior to your test. (ex: coffee, chocolate, sodas, tea). Do bring a list of your current medications with you.  If not listed below, you may take your medications as normal. Do wear comfortable clothes (no dresses or overalls) and walking shoes, tennis shoes preferred (No heels or open toe shoes are allowed). Do NOT wear cologne, perfume, aftershave, or lotions (deodorant is allowed). If these instructions are not followed, your test will have to be rescheduled.    Follow-Up: At [facility], you and your health needs are our priority.  As part of our continuing mission to provide  you with exceptional heart care, we have created designated Provider Care Teams.  These Care Teams include your primary Cardiologist (physician) and Advanced Practice Providers (APPs -  Physician Assistants and Nurse Practitioners) who all work together to provide you with the care you need, when you need it.  We recommend signing up for the patient portal called "MyChart".  Sign up information is provided on this After Visit Summary.  MyChart is used to connect with patients for Virtual Visits (Telemedicine).  Patients are able to view lab/test results, encounter notes, upcoming appointments, etc.  Non-urgent messages can be sent to your provider as well.   To learn more about what you can do with MyChart, go to [email or web address].    Your next appointment:   9 month(s)  The format for your next appointment:   In Person  Provider:   Rajan Revankar, MD   Other Instructions Cardiac Nuclear Scan A cardiac nuclear scan is a test that is done to check the flow of blood to your heart. It is done when you are resting and when you are exercising. The test looks for problems such as: Not enough blood reaching a portion of the heart. The heart muscle not working as it should. You may need this test if: You have heart disease. You have had lab results that are not normal. You have had heart surgery or a balloon procedure to open up blocked arteries (angioplasty). You have chest pain. You have shortness of breath. In this test, a special dye (tracer) is put into your bloodstream. The tracer will travel   to your heart. A camera will then take pictures of your heart to see how the tracer moves through your heart. This test is usually done at a hospital and takes 2-4 hours. Tell a doctor about: Any allergies you have. All medicines you are taking, including vitamins, herbs, eye drops, creams, and over-the-counter medicines. Any problems you or family members have had with anesthetic  medicines. Any blood disorders you have. Any surgeries you have had. Any medical conditions you have. Whether you are pregnant or may be pregnant. What are the risks? Generally, this is a safe test. However, problems may occur, such as: Serious chest pain and heart attack. This is only a risk if the stress portion of the test is done. Rapid heartbeat. A feeling of warmth in your chest. This feeling usually does not last [place]. Allergic reaction to the tracer. What happens before the test? Ask your doctor about changing or stopping your normal medicines. This is important. Follow instructions from your doctor about what you cannot eat or drink. Remove your jewelry on the day of the test. What happens during the test? An IV tube will be inserted into one of your veins. Your doctor will give you a small amount of tracer through the IV tube. You will wait for 20-40 minutes while the tracer moves through your bloodstream. Your heart will be monitored with an electrocardiogram (ECG). You will lie down on an exam table. Pictures of your heart will be taken for about 15-20 minutes. You may also have a stress test. For this test, one of these things may be done: You will be asked to exercise on a treadmill or a stationary bike. You will be given medicines that will make your heart work harder. This is done if you are unable to exercise. When blood flow to your heart has peaked, a tracer will again be given through the IV tube. After 20-40 minutes, you will get back on the exam table. More pictures will be taken of your heart. Depending on the tracer that is used, more pictures may need to be taken 3-4 hours later. Your IV tube will be removed when the test is over. The test may vary among doctors and hospitals. What happens after the test? Ask your doctor: Whether you can return to your normal schedule, including diet, activities, and medicines. Whether you should drink more fluids. This will  help to remove the tracer from your body. Drink enough fluid to keep your pee (urine) pale yellow. Ask your doctor, or the department that is doing the test: When will my results be ready? How will I get my results? Summary A cardiac nuclear scan is a test that is done to check the flow of blood to your heart. Tell your doctor whether you are pregnant or may be pregnant. Before the test, ask your doctor about changing or stopping your normal medicines. This is important. Ask your doctor whether you can return to your normal activities. You may be asked to drink more fluids. This information is not intended to replace advice given to you by your health care provider. Make sure you discuss any questions you have with your health care provider. Document Revised: 10/18/2018 Document Reviewed: 12/12/2017 Elsevier Patient Education  2021 [employer]    

## 2022-05-14 ENCOUNTER — Telehealth (HOSPITAL_COMMUNITY): Payer: Self-pay | Admitting: *Deleted

## 2022-05-14 NOTE — Telephone Encounter (Signed)
Patient given detailed instructions per Myocardial Perfusion Study Information Sheet for the test on 05/17/2022 at 7:45. Patient notified to arrive 15 minutes early and that it is imperative to arrive on time for appointment to keep from having the test rescheduled.  If you need to cancel or reschedule your appointment, please call the office within 24 hours of your appointment. . Patient verbalized understanding.Megan Murray

## 2022-05-17 ENCOUNTER — Ambulatory Visit (HOSPITAL_COMMUNITY): Payer: Medicare Other | Attending: Internal Medicine

## 2022-05-17 DIAGNOSIS — Z01810 Encounter for preprocedural cardiovascular examination: Secondary | ICD-10-CM | POA: Diagnosis not present

## 2022-05-17 DIAGNOSIS — I2510 Atherosclerotic heart disease of native coronary artery without angina pectoris: Secondary | ICD-10-CM

## 2022-05-17 LAB — MYOCARDIAL PERFUSION IMAGING
LV dias vol: 77 mL (ref 46–106)
LV sys vol: 29 mL
Nuc Stress EF: 63 %
Peak HR: 103 {beats}/min
Rest HR: 71 {beats}/min
Rest Nuclear Isotope Dose: 10.4 mCi
SDS: 5
SRS: 4
SSS: 9
Stress Nuclear Isotope Dose: 31.3 mCi
TID: 1.06

## 2022-05-17 MED ORDER — REGADENOSON 0.4 MG/5ML IV SOLN
0.4000 mg | Freq: Once | INTRAVENOUS | Status: AC
  Administered 2022-05-17: 0.4 mg via INTRAVENOUS

## 2022-05-17 MED ORDER — TECHNETIUM TC 99M TETROFOSMIN IV KIT
31.3000 | PACK | Freq: Once | INTRAVENOUS | Status: AC | PRN
  Administered 2022-05-17: 31.3 via INTRAVENOUS

## 2022-05-17 MED ORDER — TECHNETIUM TC 99M TETROFOSMIN IV KIT
10.4000 | PACK | Freq: Once | INTRAVENOUS | Status: AC | PRN
  Administered 2022-05-17: 10.4 via INTRAVENOUS

## 2022-05-24 ENCOUNTER — Ambulatory Visit (INDEPENDENT_AMBULATORY_CARE_PROVIDER_SITE_OTHER): Payer: Medicare Other | Admitting: Medical

## 2022-05-24 VITALS — BP 134/60 | HR 85 | Resp 18 | Ht 61.0 in | Wt 215.0 lb

## 2022-05-24 DIAGNOSIS — E119 Type 2 diabetes mellitus without complications: Secondary | ICD-10-CM

## 2022-05-24 DIAGNOSIS — Z794 Long term (current) use of insulin: Secondary | ICD-10-CM

## 2022-05-24 DIAGNOSIS — E785 Hyperlipidemia, unspecified: Secondary | ICD-10-CM

## 2022-05-24 DIAGNOSIS — F5101 Primary insomnia: Secondary | ICD-10-CM

## 2022-05-24 DIAGNOSIS — M797 Fibromyalgia: Secondary | ICD-10-CM | POA: Diagnosis not present

## 2022-05-24 DIAGNOSIS — E1169 Type 2 diabetes mellitus with other specified complication: Secondary | ICD-10-CM

## 2022-05-24 DIAGNOSIS — I10 Essential (primary) hypertension: Secondary | ICD-10-CM

## 2022-05-24 NOTE — Progress Notes (Signed)
Subjective:    Patient ID: Megan Murray, female    DOB: 03/25/1966, 56 y.o.   MRN: [id number]  HPI  Pt in for follow up.   Last visit A/P "Insomnia Megan Murray is to work on aiming for 6-8  hours of sleep per night. Will continue ambien.   2. Hyperlipidemia, high cholsterol and FH CAD Megan Murray has a follow-up scheduled with pharmD  to discuss treatment options. Pt has been on nexlitol per pharmacist. No side effects. Has upcoming appointment.   3. Type 2 diabetes mellitus without complication, with [place]-term current use of insulin (HCC) Megan Murray will continue with her healthy eating plan.  Continue Tresiba and Humalog as directed per endocrinology.  She is to increase exercise frequency to 30 minutes 3+ times per week.   4. Vitamin D deficiency Per healthy weight loss management wants us to vit D level. Will get that today.   5. Fibromyalgia Megan Murray is to try walking at moderate pace or water exercises 3-4 times per week, as this would be ideal for her. Since trigger pont injection helped in past placed referral to see if Dr. Jordan LikesSchmitz sports med MD might do.   6. Obesity current- BMI 37.1 Weight loss management states  Category 2 Plan. Wt loss manGement brought up gastirc bypass. Pt has gotten paperwork from [facility] and leaning toward getting surgery.   For recent joint pains worse past 2 months though had pain for years. Will get inflammatory lab tests.   Moods some worse as she gained weight. Phq 9 score"   Today updates me had negative stress one ago.   Pt has low vit D. Pt is on vit d weekly and taking vit d otc daily as well.  Pt did wt loss management. Pt is seeing surgeon and going thru with bariatric pathway. Pt at end of the month will see psychiatrist. Pt had recently gain about 15 lb despite wt loss management visits.   Diabetic- A1c 6.9.   still on Tresiba and Humalog as directed per endocrinology   Pt continue nexitrol for high cholesterol.   Pt had fibromyalgia- pt tried  water exercise but pool was to cold.   Still having insomnia. She is [place] Uds and contract march 2023.  Htn- bp controlled. On losartan 50 mg daily.    Review of Systems  Constitutional:  Negative for chills, fatigue and fever.  Respiratory:  Negative for cough, chest tightness, shortness of breath and wheezing.   Cardiovascular:  Negative for chest pain and palpitations.  Gastrointestinal:  Negative for abdominal pain, nausea and vomiting.  Musculoskeletal:  Negative for myalgias.  Skin:  Negative for rash.  Hematological:  Negative for adenopathy. Does not bruise/bleed easily.  Psychiatric/Behavioral:  Negative for behavioral problems and decreased concentration.     Past Medical History:  Diagnosis Date   Asthma    Atypical chest pain 02/20/2021   B12 deficiency 01/23/2015   Chronic fatigue 04/26/2019   Coronary artery disease moderate based on coronary CT angio of circumflex 04/29/2021   Cough variant asthma 08/10/2019   COVID-19 05/14/2021   Depression    Diabetes mellitus (HCC) 02/19/2021   Diabetes mellitus without complication (HCC)    DKA (diabetic ketoacidosis) (HCC) 09/27/2021   Dyslipidemia, goal to be determined    High triglycerides and LDL   Dyspnea on exertion 6/57/84698/06/2021   Essential hypertension 11/09/2016   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   Fatty liver 05/16/2018   By CT scan   Fatty liver  Fibromyalgia    Gallbladder problem    Gastroesophageal reflux disease without esophagitis 04/26/2019   Hyperlipidemia    Joint pain    Major depression, recurrent, chronic (HCC) 04/25/2018   Dr. Evelene Croon   Mixed hyperlipidemia 11/04/2016   Failed statins; hyperglycemia with repatha. Stopped 06/2018    Myofascial pain 11/12/2019   Obesity due to excess calories without serious comorbidity    Osteoarthritis, multiple sites 04/25/2018   Other fatigue 01/20/2022   Other hyperlipidemia 01/20/2022   Pancreatic disease    PCOS (polycystic ovarian syndrome)     Primary osteoarthritis of left knee 11/12/2019   Primary osteoarthritis of right knee 11/12/2019   Prolonged QT interval 09/27/2021   Reactive airway disease 02/19/2021   Seasonal allergic rhinitis due to pollen 05/19/2018   SOBOE (shortness of breath on exertion)    Statin intolerance 06/21/2018   Type 2 diabetes mellitus without complication, without [place]-term current use of insulin (HCC) 11/09/2016   Vitamin D deficiency      Social History   Socioeconomic History   Marital status: Married    Spouse name: Vernell Barrier   Number of children: 1   Years of education: Not on file   Highest education level: Not on file  Occupational History   Occupation: disabled due to fibromyalgia and depression  Tobacco Use   Smoking status: Never   Smokeless tobacco: Never  Vaping Use   Vaping Use: Never used  Substance and Sexual Activity   Alcohol use: No   Drug use: No   Sexual activity: Yes  Other Topics Concern   Not on file  Social History Narrative   Not on file   Social Determinants of Health   Financial Resource Strain: Low Risk  (09/15/2021)   Overall Financial Resource Strain (CARDIA)    Difficulty of Paying Living Expenses: Not hard at all  Food Insecurity: No Food Insecurity (09/15/2021)   Hunger Vital Sign    Worried About Running Out of Food in the Last Year: Never true    Ran Out of Food in the Last Year: Never true  Transportation Needs: No Transportation Needs (09/15/2021)   PRAPARE - Administrator, Civil Service (Medical): No    Lack of Transportation (Non-Medical): No  Physical Activity: Not on file  Stress: No Stress Concern Present (09/15/2021)   [employer] of Occupational Health - Occupational Stress Questionnaire    Feeling of Stress : Not at all  Social Connections: Moderately Integrated (09/15/2021)   Social Connection and Isolation Panel [NHANES]    Frequency of Communication with Friends and Family: More than three times a week    Frequency of Social  Gatherings with Friends and Family: Twice a week    Attends Religious Services: More than 4 times per year    Active Member of [employer] or Organizations: No    Attends Banker Meetings: Never    Marital Status: Married  Catering manager Violence: Not At Risk (09/15/2021)   Humiliation, Afraid, Rape, and Kick questionnaire    Fear of Current or Ex-Partner: No    Emotionally Abused: No    Physically Abused: No    Sexually Abused: No    Past Surgical History:  Procedure Laterality Date   ABDOMINAL HYSTERECTOMY     CESAREAN SECTION  1994   GALLBLADDER SURGERY  1995   REDUCTION MAMMAPLASTY      Family History  Problem Relation Age of Onset   Heart disease Mother    Hypertension  Mother    COPD Mother        Died at 42   Heart failure Mother        Not sure of details   Depression Mother    Liver disease Father    Esophageal cancer Father        Survived cancer treatment and surgery   Drug abuse Father    Cirrhosis Father        NAFLD   Healthy Sister    Diabetes Brother    Depression Brother    Heart attack Brother    Heart disease Brother    Diabetes Maternal Grandmother    Arthritis Maternal Grandmother    Diabetes Maternal Grandfather    Cancer Paternal Grandmother    Hearing loss Paternal Grandmother    Liver disease Paternal Grandfather     Allergies  Allergen Reactions   Codeine Palpitations    Natural or Synthetic Codeine   Atorvastatin Nausea Only and Other (See Comments)    Myalgias   Cholestyramine Diarrhea and Nausea Only   Crestor [Rosuvastatin Calcium] Nausea Only and Other (See Comments)    Myalgias   Fenofibrate Diarrhea and Nausea And Vomiting   Simvastatin Nausea Only and Other (See Comments)    Myalgias   Soy Allergy Diarrhea and Nausea And Vomiting   Statins Nausea Only and Other (See Comments)     Myalgias   Dulaglutide Other (See Comments)   Empagliflozin-Metformin Hcl Other (See Comments)    Caused pancreatitis   Ozempic (0.25  Or 0.5 Mg-Dose) [Semaglutide(0.25 Or 0.5mg -Dos)]     Caused pancreatitis   Repatha [Evolocumab] Other (See Comments)    flu   Meloxicam Rash    Current Outpatient Medications on File Prior to Visit  Medication Sig Dispense Refill   albuterol (VENTOLIN HFA) 108 (90 Base) MCG/ACT inhaler Inhale 2 puffs into the lungs every 6 (six) hours as needed for wheezing or shortness of breath. 6.7 g 2   aspirin EC 81 MG tablet Take 1 tablet (81 mg total) by mouth daily. Swallow whole. 90 tablet 3   Bempedoic Acid-Ezetimibe (NEXLIZET) 180-10 MG TABS Take 1 tablet by mouth daily. 90 tablet 3   Continuous Blood Gluc Receiver (FREESTYLE LIBRE 2 READER) DEVI Use as directed 1 each 0   Continuous Blood Gluc Sensor (FREESTYLE LIBRE 2 SENSOR) MISC USE AS DIRECTED TO TEST BLOOD SUGAR AS DIRECTED BY MD 2 each 5   FLUoxetine (PROZAC) 40 MG capsule TAKE 1 CAPSULE(40 MG) BY MOUTH DAILY 90 capsule 0   fluticasone furoate-vilanterol (BREO ELLIPTA) 200-25 MCG/ACT AEPB Inhale 1 puff into the lungs daily.     glucose blood test strip Use as instructed: One touch Ultra E11.9 100 each 12   ibuprofen (ADVIL) 600 MG tablet Take 1 tablet by mouth every 6 (six) hours as needed for headache or pain.     influenza vac split quadrivalent PF (FLUARIX QUADRIVALENT) 0.5 ML injection Inject into the muscle. 0.5 mL 0   insulin lispro (HUMALOG KWIKPEN) 100 UNIT/ML KwikPen Inject 6 Units into the skin 3 (three) times daily. 15 mL 11   losartan (COZAAR) 50 MG tablet TAKE 1 TABLET(50 MG) BY MOUTH DAILY 90 tablet 0   ofloxacin (OCUFLOX) 0.3 % ophthalmic solution Place 1 drop into the right eye 4 (four) times daily.     pantoprazole (PROTONIX) 40 MG tablet Take 1 tablet (40 mg total) by mouth daily. 90 tablet 0   traMADol (ULTRAM) 50 MG tablet TAKE 1 TABLET(50  MG) BY MOUTH EVERY 8 HOURS FOR UP TO 5 DAYS AS NEEDED 30 tablet 1   TRESIBA FLEXTOUCH 200 UNIT/ML FlexTouch Pen Inject 50 Units into the skin 2 (two) times daily.     Vitamin D,  Ergocalciferol, (DRISDOL) 1.25 MG (50000 UNIT) CAPS capsule Take 1 capsule (50,000 Units total) by mouth every 7 (seven) days. 8 capsule 0   zolpidem (AMBIEN) 10 MG tablet 1 tab po q hs prn insomnia. 30 tablet 3   No current facility-administered medications on file prior to visit.    BP 134/60   Pulse 85   Resp 18   Ht 5\' 1"  (1.549 m)   Wt 215 lb (97.5 kg)   SpO2 95%   BMI 40.62 kg/m        Objective:   Physical Exam  General Mental Status- Alert. General Appearance- Not in acute distress.   Skin General: Color- Normal Color. Moisture- Normal Moisture.  Neck Carotid Arteries- Normal color. Moisture- Normal Moisture. No carotid bruits. No JVD.  Chest and Lung Exam Auscultation: Breath Sounds:-Normal.  Cardiovascular Auscultation:Rythm- Regular. Murmurs & Other Heart Sounds:Auscultation of the heart reveals- No Murmurs.  Abdomen Inspection:-Inspeection Normal. Palpation/Percussion:Note:No mass. Palpation and Percussion of the abdomen reveal- Non Tender, Non Distended + BS, no rebound or guarding.   Neurologic Cranial Nerve exam:- CN III-XII intact(No nystagmus), symmetric smile. Strength:- 5/5 equal and symmetric strength both upper and lower extremities.       Assessment & Plan:   Patient Instructions  Htn- bp controlled. On losartan 50 mg daily.  Insomnia. Continue  ambien. Uds and contract march 2023.   Nexitrol for high cholesterol. Future lipid panel and cmp in one month.  Diabetic- A1c 6.9.   still on Tresiba and Humalog as directed per endocrinology  Vit D deficiency- future vit d level to be done in one month.  For obesity- continue with bariatric MD program.   Follow up in date to be determined after lab review.     08-15-1977, PA-C

## 2022-05-24 NOTE — Patient Instructions (Addendum)
Htn- bp controlled. On losartan 50 mg daily.  Insomnia. Continue  ambien. Uds and contract march 2023.  Nexitrol for high cholesterol. Future lipid panel and cmp in one month.  Diabetic- A1c 6.9.   still on Tresiba and Humalog as directed per endocrinology  Vit D deficiency- future vit d level to be done in one month.  For obesity- continue with bariatric MD program.   Follow up in date to be determined after lab review.

## 2022-05-26 ENCOUNTER — Ambulatory Visit: Payer: Medicare Other | Admitting: Physical Therapy

## 2022-05-27 ENCOUNTER — Ambulatory Visit: Payer: Medicare Other

## 2022-05-30 ENCOUNTER — Other Ambulatory Visit: Payer: Self-pay | Admitting: Medical

## 2022-05-30 DIAGNOSIS — F5101 Primary insomnia: Secondary | ICD-10-CM

## 2022-05-31 NOTE — Telephone Encounter (Addendum)
Requesting: Remus Loffler Contract:09/21/21 UDS:09/21/21 Last Visit:05/24/22 Next Visit:n/a Last Refill:03/01/22  Please Advise   Rx refill sent to pharmacy.  Esperanza Richters, PA-C

## 2022-06-07 ENCOUNTER — Encounter: Payer: Self-pay | Admitting: Dietician

## 2022-06-07 ENCOUNTER — Encounter: Payer: Medicare Other | Attending: General Surgery | Admitting: Dietician

## 2022-06-07 VITALS — Ht 61.0 in | Wt 217.2 lb

## 2022-06-07 DIAGNOSIS — E669 Obesity, unspecified: Secondary | ICD-10-CM | POA: Insufficient documentation

## 2022-06-07 DIAGNOSIS — E119 Type 2 diabetes mellitus without complications: Secondary | ICD-10-CM | POA: Diagnosis present

## 2022-06-07 NOTE — Progress Notes (Addendum)
Supervised Weight Loss Visit Bariatric Nutrition Education  Planned surgery: RYGB Pt expectation of surgery: better quality of life; wt loss would be a bonus  2 out of 4 SWL Appointments   NUTRITION ASSESSMENT  Anthropometrics  Start weight at [facility]: 213.0 lbs (date: 04/22/2022)  Height: 61 in Weight today: 217.2 lbs BMI: 41.04 kg/m2     Clinical  Medical hx: arthritis, asthma, depression, DM, hyperlipidemia, HTN, nerve/muscle disease Medications: see list  Labs: vit D 30.85;  Notable signs/symptoms: nothing noted Any previous deficiencies? No  Lifestyle & Dietary Hx  Pt states she has been mentally keeping track of her protein. Pt states she sets a timer for drinking water.  Pt states she can't do artificial sweeteners except aspartame, stating Sucralose will raise her blood sugars. Pt states she had a stress test, stating they gave her diet coke, stating it was wonderful.  Pt states she has not had anymore and is okay with that. Pt states she does not have starchy vegetables at all, stating starchy vegetables and complex carbohydrates effect her blood sugars extremely. Pt states she will have another A1c taken in January, stating the last one was 6.9 (09/20/203). Pt states atmospheric barometric pressure effects her fiber myalgia, stating she has lived with it so [place] she knows when it is coming, what to do, when to quit before/when the pain starts.  Estimated daily fluid intake: 80 oz Supplements: Vit D Current average weekly physical activity: Walking 30 minutes 3-4 days a week, stating the loop is a little over 2 miles.  24-Hr Dietary Recall First Meal: 2 eggs (boiled or scrambled) with vegetables when scrambled Snack: protein shake Second Meal: deli meat and cheese wrap or salad or left overs or chicken salad Snack:  Third Meal: protein/meat and non-starchy vegetables Snack: sometimes a protein shake or cucumbers. Beverages: water  Estimated Energy Needs Calories:  1500  NUTRITION DIAGNOSIS  Overweight/obesity (-3.3) related to past poor dietary habits and physical inactivity as evidenced by patient w/ planned RYGB surgery following dietary guidelines for continued weight loss.  NUTRITION INTERVENTION  Nutrition counseling (C-1) and education (E-2) to facilitate bariatric surgery goals.  Physical activity is an important part of a healthy lifestyle so keep it moving! The goal is to reach 150 minutes of exercise per week, including cardiovascular and weight baring activity.  Pre-Op Goals Progress & New Goals Continue: to practice not drinking with meals Continue: not drinking sodas/carbonated beverages Continue/re-engage: add one more day of physical activity per week Continue/re-engage: Track protein intake per day  Handouts Provided Include  Protein Foods List to estimate daily protein intake  Learning Style & Readiness for Change Teaching method utilized: Visual & Auditory  Demonstrated degree of understanding via: Teach Back  Readiness Level: Action Barriers to learning/adherence to lifestyle change: none identified  RD's Notes for next Visit  Progress toward patient's chosen goals   MONITORING & EVALUATION Dietary intake, weekly physical activity, body weight, and pre-op goals in 1 month.   Next Steps  Patient is to return to [facility] in one month for next [facility] visit.

## 2022-06-09 ENCOUNTER — Ambulatory Visit (INDEPENDENT_AMBULATORY_CARE_PROVIDER_SITE_OTHER): Payer: Medicare Other | Admitting: Licensed Clinical Social Worker

## 2022-06-09 DIAGNOSIS — F4321 Adjustment disorder with depressed mood: Secondary | ICD-10-CM | POA: Diagnosis not present
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
## 2022-06-18 ENCOUNTER — Other Ambulatory Visit: Payer: Self-pay | Admitting: Medical

## 2022-06-23 ENCOUNTER — Other Ambulatory Visit: Payer: Self-pay | Admitting: Medical

## 2022-06-23 ENCOUNTER — Ambulatory Visit (INDEPENDENT_AMBULATORY_CARE_PROVIDER_SITE_OTHER): Payer: Medicare Other | Admitting: Medical

## 2022-06-23 ENCOUNTER — Encounter: Payer: Self-pay | Admitting: Medical

## 2022-06-23 ENCOUNTER — Other Ambulatory Visit: Payer: Medicare Other

## 2022-06-23 VITALS — BP 127/80 | HR 70 | Temp 97.9°F | Resp 18 | Ht 61.0 in | Wt 219.6 lb

## 2022-06-23 DIAGNOSIS — R059 Cough, unspecified: Secondary | ICD-10-CM | POA: Diagnosis not present

## 2022-06-23 DIAGNOSIS — E1169 Type 2 diabetes mellitus with other specified complication: Secondary | ICD-10-CM

## 2022-06-23 DIAGNOSIS — E785 Hyperlipidemia, unspecified: Secondary | ICD-10-CM | POA: Diagnosis not present

## 2022-06-23 DIAGNOSIS — J309 Allergic rhinitis, unspecified: Secondary | ICD-10-CM

## 2022-06-23 DIAGNOSIS — I10 Essential (primary) hypertension: Secondary | ICD-10-CM

## 2022-06-23 DIAGNOSIS — J3489 Other specified disorders of nose and nasal sinuses: Secondary | ICD-10-CM | POA: Diagnosis not present

## 2022-06-23 DIAGNOSIS — Z79899 Other long term (current) drug therapy: Secondary | ICD-10-CM

## 2022-06-23 DIAGNOSIS — E559 Vitamin D deficiency, unspecified: Secondary | ICD-10-CM | POA: Diagnosis not present

## 2022-06-23 DIAGNOSIS — G4700 Insomnia, unspecified: Secondary | ICD-10-CM

## 2022-06-23 LAB — LDL CHOLESTEROL, DIRECT: Direct LDL: 145 mg/dL

## 2022-06-23 LAB — LIPID PANEL
Cholesterol: 208 mg/dL — ABNORMAL HIGH (ref 0–200)
HDL: 41.4 mg/dL (ref >39.00)
NonHDL: 166.71
Total CHOL/HDL Ratio: 5
Triglycerides: 237 mg/dL — ABNORMAL HIGH (ref 0.0–149.0)
VLDL: 47.4 mg/dL — ABNORMAL HIGH (ref 0.0–40.0)

## 2022-06-23 LAB — COMPREHENSIVE METABOLIC PANEL
ALT: 18 U/L (ref 0–35)
AST: 13 U/L (ref 0–37)
Albumin: 4.5 g/dL (ref 3.5–5.2)
Alkaline Phosphatase: 69 U/L (ref 39–117)
BUN: 12 mg/dL (ref 6–23)
CO2: 28 mEq/L (ref 19–32)
Calcium: 9.2 mg/dL (ref 8.4–10.5)
Chloride: 102 mEq/L (ref 96–112)
Creatinine, Ser: 0.43 mg/dL (ref 0.40–1.20)
GFR: 108.21 mL/min (ref >60.00)
Glucose, Bld: 172 mg/dL — ABNORMAL HIGH (ref 70–99)
Potassium: 4.3 mEq/L (ref 3.5–5.1)
Sodium: 139 mEq/L (ref 135–145)
Total Bilirubin: 0.5 mg/dL (ref 0.2–1.2)
Total Protein: 7.1 g/dL (ref 6.0–8.3)

## 2022-06-23 LAB — VITAMIN D 25 HYDROXY (VIT D DEFICIENCY, FRACTURES): VITD: 41.07 ng/mL (ref 30.00–100.00)

## 2022-06-23 MED ORDER — BENZONATATE 100 MG PO CAPS: 100.0000 mg | ORAL_CAPSULE | Freq: Three times a day (TID) | ORAL | 0 refills | Status: DC | PRN

## 2022-06-23 MED ORDER — CEFDINIR 300 MG PO CAPS: 300.0000 mg | ORAL_CAPSULE | Freq: Two times a day (BID) | ORAL | 0 refills | Status: DC

## 2022-06-23 MED ORDER — FLUTICASONE PROPIONATE 50 MCG/ACT NA SUSP: 2.0000 | Freq: Every day | NASAL | 1 refills | Status: DC

## 2022-06-23 MED ORDER — LEVOCETIRIZINE DIHYDROCHLORIDE 5 MG PO TABS: 5.0000 mg | ORAL_TABLET | Freq: Every evening | ORAL | 3 refills | Status: DC

## 2022-06-23 MED ORDER — BUDESONIDE-FORMOTEROL FUMARATE 80-4.5 MCG/ACT IN AERO: 2.0000 | INHALATION_SPRAY | Freq: Two times a day (BID) | RESPIRATORY_TRACT | 3 refills | Status: DC

## 2022-06-23 NOTE — Progress Notes (Signed)
Subjective:    Patient ID: Megan Murray, female    DOB: 1966/07/04, 56 y.o.   MRN: [id number]  HPI Pt over past month has had some nasal congestion, pnd and cough. Pt states history of fall and spring allergies. Pt states has just used albuterol as she will notice some occasional wheezing.  Both dry and productive cough. At times.  No fevers, no chills or sweats.  Pt using albuterol 2-3 times a week.  Pt only using saline nasal spray.   Review of Systems  Constitutional:  Negative for chills and fatigue.  HENT:  Positive for congestion, postnasal drip and sinus pressure. Negative for ear pain.   Respiratory:  Positive for cough. Negative for chest tightness and shortness of breath.   Cardiovascular:  Negative for chest pain and palpitations.  Gastrointestinal:  Negative for abdominal pain.  Musculoskeletal:  Negative for back pain, joint swelling and myalgias.  Neurological:  Negative for dizziness, syncope, weakness, numbness and headaches.  Hematological:  Negative for adenopathy. Does not bruise/bleed easily.  Psychiatric/Behavioral:  Negative for behavioral problems and confusion.     Past Medical History:  Diagnosis Date   Asthma    Atypical chest pain 02/20/2021   B12 deficiency 01/23/2015   Chronic fatigue 04/26/2019   Coronary artery disease moderate based on coronary CT angio of circumflex 04/29/2021   Cough variant asthma 08/10/2019   COVID-19 05/14/2021   Depression    Diabetes mellitus (HCC) 02/19/2021   Diabetes mellitus without complication (HCC)    DKA (diabetic ketoacidosis) (HCC) 09/27/2021   Dyslipidemia, goal to be determined    High triglycerides and LDL   Dyspnea on exertion 10/11/270   Essential hypertension 11/09/2016   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   Fatty liver 05/16/2018   By CT scan   Fatty liver    Fibromyalgia    Gallbladder problem    Gastroesophageal reflux disease without esophagitis 04/26/2019   Hyperlipidemia    Joint pain     Major depression, recurrent, chronic (HCC) 04/25/2018   Dr. Evelene Croon   Mixed hyperlipidemia 11/04/2016   Failed statins; hyperglycemia with repatha. Stopped 06/2018    Myofascial pain 11/12/2019   Obesity due to excess calories without serious comorbidity    Osteoarthritis, multiple sites 04/25/2018   Other fatigue 01/20/2022   Other hyperlipidemia 01/20/2022   Pancreatic disease    PCOS (polycystic ovarian syndrome)    Primary osteoarthritis of left knee 11/12/2019   Primary osteoarthritis of right knee 11/12/2019   Prolonged QT interval 09/27/2021   Reactive airway disease 02/19/2021   Seasonal allergic rhinitis due to pollen 05/19/2018   SOBOE (shortness of breath on exertion)    Statin intolerance 06/21/2018   Type 2 diabetes mellitus without complication, without [place]-term current use of insulin (HCC) 11/09/2016   Vitamin D deficiency      Social History   Socioeconomic History   Marital status: Married    Spouse name: Vernell Barrier   Number of children: 1   Years of education: Not on file   Highest education level: Not on file  Occupational History   Occupation: disabled due to fibromyalgia and depression  Tobacco Use   Smoking status: Never   Smokeless tobacco: Never  Vaping Use   Vaping Use: Never used  Substance and Sexual Activity   Alcohol use: No   Drug use: No   Sexual activity: Yes  Other Topics Concern   Not on file  Social History Narrative   Not on file  Social Determinants of Health   Financial Resource Strain: Low Risk  (09/15/2021)   Overall Financial Resource Strain (CARDIA)    Difficulty of Paying Living Expenses: Not hard at all  Food Insecurity: No Food Insecurity (09/15/2021)   Hunger Vital Sign    Worried About Running Out of Food in the Last Year: Never true    Ran Out of Food in the Last Year: Never true  Transportation Needs: No Transportation Needs (09/15/2021)   PRAPARE - Administrator, Civil Service (Medical): No    Lack of  Transportation (Non-Medical): No  Physical Activity: Not on file  Stress: No Stress Concern Present (09/15/2021)   [employer] of Occupational Health - Occupational Stress Questionnaire    Feeling of Stress : Not at all  Social Connections: Moderately Integrated (09/15/2021)   Social Connection and Isolation Panel [NHANES]    Frequency of Communication with Friends and Family: More than three times a week    Frequency of Social Gatherings with Friends and Family: Twice a week    Attends Religious Services: More than 4 times per year    Active Member of [employer] or Organizations: No    Attends Banker Meetings: Never    Marital Status: Married  Catering manager Violence: Not At Risk (09/15/2021)   Humiliation, Afraid, Rape, and Kick questionnaire    Fear of Current or Ex-Partner: No    Emotionally Abused: No    Physically Abused: No    Sexually Abused: No    Past Surgical History:  Procedure Laterality Date   ABDOMINAL HYSTERECTOMY     CESAREAN SECTION  1994   GALLBLADDER SURGERY  1995   REDUCTION MAMMAPLASTY      Family History  Problem Relation Age of Onset   Heart disease Mother    Hypertension Mother    COPD Mother        Died at 60   Heart failure Mother        Not sure of details   Depression Mother    Liver disease Father    Esophageal cancer Father        Survived cancer treatment and surgery   Drug abuse Father    Cirrhosis Father        NAFLD   Healthy Sister    Diabetes Brother    Depression Brother    Heart attack Brother    Heart disease Brother    Diabetes Maternal Grandmother    Arthritis Maternal Grandmother    Diabetes Maternal Grandfather    Cancer Paternal Grandmother    Hearing loss Paternal Grandmother    Liver disease Paternal Grandfather     Allergies  Allergen Reactions   Codeine Palpitations    Natural or Synthetic Codeine   Atorvastatin Nausea Only and Other (See Comments)    Myalgias   Cholestyramine Diarrhea and  Nausea Only   Crestor [Rosuvastatin Calcium] Nausea Only and Other (See Comments)    Myalgias   Fenofibrate Diarrhea and Nausea And Vomiting   Simvastatin Nausea Only and Other (See Comments)    Myalgias   Soy Allergy Diarrhea and Nausea And Vomiting   Statins Nausea Only and Other (See Comments)     Myalgias   Dulaglutide Other (See Comments)   Empagliflozin-Metformin Hcl Other (See Comments)    Caused pancreatitis   Ozempic (0.25 Or 0.5 Mg-Dose) [Semaglutide(0.25 Or 0.5mg -Dos)]     Caused pancreatitis   Repatha [Evolocumab] Other (See Comments)    flu  Meloxicam Rash    Current Outpatient Medications on File Prior to Visit  Medication Sig Dispense Refill   albuterol (VENTOLIN HFA) 108 (90 Base) MCG/ACT inhaler Inhale 2 puffs into the lungs every 6 (six) hours as needed for wheezing or shortness of breath. 6.7 g 2   aspirin EC 81 MG tablet Take 1 tablet (81 mg total) by mouth daily. Swallow whole. 90 tablet 3   Bempedoic Acid-Ezetimibe (NEXLIZET) 180-10 MG TABS Take 1 tablet by mouth daily. 90 tablet 3   Continuous Blood Gluc Receiver (FREESTYLE LIBRE 2 READER) DEVI Use as directed 1 each 0   Continuous Blood Gluc Sensor (FREESTYLE LIBRE 2 SENSOR) MISC USE AS DIRECTED TO TEST BLOOD SUGAR AS DIRECTED BY MD 2 each 5   FLUoxetine (PROZAC) 40 MG capsule TAKE 1 CAPSULE(40 MG) BY MOUTH DAILY 90 capsule 0   fluticasone furoate-vilanterol (BREO ELLIPTA) 200-25 MCG/ACT AEPB Inhale 1 puff into the lungs daily.     glucose blood test strip Use as instructed: One touch Ultra E11.9 100 each 12   ibuprofen (ADVIL) 600 MG tablet Take 1 tablet by mouth every 6 (six) hours as needed for headache or pain.     influenza vac split quadrivalent PF (FLUARIX QUADRIVALENT) 0.5 ML injection Inject into the muscle. 0.5 mL 0   insulin lispro (HUMALOG KWIKPEN) 100 UNIT/ML KwikPen Inject 6 Units into the skin 3 (three) times daily. 15 mL 11   losartan (COZAAR) 50 MG tablet TAKE 1 TABLET(50 MG) BY MOUTH DAILY 90  tablet 0   ofloxacin (OCUFLOX) 0.3 % ophthalmic solution Place 1 drop into the right eye 4 (four) times daily.     pantoprazole (PROTONIX) 40 MG tablet Take 1 tablet (40 mg total) by mouth daily. 90 tablet 0   traMADol (ULTRAM) 50 MG tablet TAKE 1 TABLET(50 MG) BY MOUTH EVERY 8 HOURS FOR UP TO 5 DAYS AS NEEDED 30 tablet 1   TRESIBA FLEXTOUCH 200 UNIT/ML FlexTouch Pen Inject 50 Units into the skin 2 (two) times daily.     Vitamin D, Ergocalciferol, (DRISDOL) 1.25 MG (50000 UNIT) CAPS capsule Take 1 capsule (50,000 Units total) by mouth every 7 (seven) days. 8 capsule 0   zolpidem (AMBIEN) 10 MG tablet TAKE 1 TABLET BY MOUTH EVERY NIGHT AT BEDTIME AS NEEDED FOR INSOMNIA 30 tablet 2   No current facility-administered medications on file prior to visit.    BP 127/80 Comment: last night when pt checked.  Pulse 70   Temp 97.9 F (36.6 C)   Resp 18   Ht 5\' 1"  (1.549 m)   Wt 219 lb 9.6 oz (99.6 kg)   SpO2 97%   BMI 41.49 kg/m        Objective:   Physical Exam   General- No acute distress. Pleasant patient. Neck- Full range of motion, no jvd Lungs- Clear, even and unlabored. Heart- regular rate and rhythm. Neurologic- CNII- XII grossly intact.   Heent-faint sinus pressure to palpation. +pnd. Ears clear and normal tm's.       Assessment & Plan:   Patient Instructions  Allergic rhinitis over past month. History of fall allergies. Rx flonase nasal spray, xyzal anthistamine and benzonatate for cough.  Faint sinus pressure and prolonged signs/symptoms. Will make cefdnir antibiotic to start if sinus pain worsens.  For wheezing making symbicort available.   For insomnia we got you to sign your contract and give updated uds.  Follow up 7-10 days or sooner if needed.     Ramon Dredge  Domenico Achord, PA-C

## 2022-06-23 NOTE — Addendum Note (Signed)
Addended by: Maximino Sarin on: 06/23/2022 09:22 AM   Modules accepted: Orders

## 2022-06-23 NOTE — Patient Instructions (Addendum)
Allergic rhinitis over past month. History of fall allergies. Rx flonase nasal spray, xyzal anthistamine and benzonatate for cough.  Faint sinus pressure and prolonged signs/symptoms. Will make cefdnir antibiotic to start if sinus pain worsens.  For wheezing making symbicort available.   For insomnia we got you to sign your contract and give updated uds.  Also update contract for fibromyagla/tramadol use.  Follow up 7-10 days or sooner if needed.

## 2022-06-24 MED ORDER — FLUTICASONE-SALMETEROL 100-50 MCG/ACT IN AEPB: 1.0000 | INHALATION_SPRAY | Freq: Two times a day (BID) | RESPIRATORY_TRACT | 0 refills | Status: DC

## 2022-06-24 NOTE — Addendum Note (Signed)
Addended by: Gwenevere Abbot on: 06/24/2022 08:07 AM   Modules accepted: Orders

## 2022-06-25 LAB — DRUG MONITORING PANEL 376104, URINE
Amphetamines: NEGATIVE ng/mL (ref <500)
Barbiturates: NEGATIVE ng/mL (ref <300)
Benzodiazepines: NEGATIVE ng/mL (ref <100)
Cocaine Metabolite: NEGATIVE ng/mL (ref <150)
Desmethyltramadol: NEGATIVE ng/mL (ref <100)
Opiates: NEGATIVE ng/mL (ref <100)
Oxycodone: NEGATIVE ng/mL (ref <100)
Tramadol: NEGATIVE ng/mL (ref <100)

## 2022-06-25 LAB — DM TEMPLATE

## 2022-06-29 ENCOUNTER — Other Ambulatory Visit: Payer: Self-pay | Admitting: Family

## 2022-06-29 ENCOUNTER — Encounter: Payer: Self-pay | Admitting: Family

## 2022-06-29 ENCOUNTER — Telehealth (INDEPENDENT_AMBULATORY_CARE_PROVIDER_SITE_OTHER): Payer: Medicare Other | Admitting: Family

## 2022-06-29 VITALS — Wt 219.0 lb

## 2022-06-29 DIAGNOSIS — R053 Chronic cough: Secondary | ICD-10-CM | POA: Diagnosis not present

## 2022-06-29 MED ORDER — AMOXICILLIN-POT CLAVULANATE 875-125 MG PO TABS: 1.0000 | ORAL_TABLET | Freq: Two times a day (BID) | ORAL | 0 refills | Status: AC

## 2022-06-29 MED ORDER — FLUTICASONE-SALMETEROL 250-50 MCG/ACT IN AEPB: 1.0000 | INHALATION_SPRAY | Freq: Two times a day (BID) | RESPIRATORY_TRACT | 0 refills | Status: DC

## 2022-06-29 NOTE — Progress Notes (Signed)
Megan Murray is a 56 y.o. female with the following history as recorded in [employer]:  Patient Active Problem List   Diagnosis Date Noted   Preoperative cardiovascular examination 05/13/2022   Asthma 05/05/2022   Diabetes mellitus without complication (HCC) 05/05/2022   Gallbladder problem 05/05/2022   Hyperlipidemia 05/05/2022   Joint pain 05/05/2022   Pancreatic disease 05/05/2022   PCOS (polycystic ovarian syndrome) 05/05/2022   Other fatigue 01/20/2022   SOBOE (shortness of breath on exertion) 01/20/2022   Vitamin D deficiency 01/20/2022   Other hyperlipidemia 01/20/2022   Depression 01/20/2022   DKA (diabetic ketoacidosis) (HCC) 09/27/2021   Prolonged QT interval 09/27/2021   COVID-19 05/14/2021   Coronary artery disease moderate based on coronary CT angio of circumflex 04/29/2021   Dyspnea on exertion 02/20/2021   Atypical chest pain 02/20/2021   Obesity Murray to excess calories without serious comorbidity 02/19/2021   Dyslipidemia, goal to be determined 02/19/2021   Diabetes mellitus (HCC) 02/19/2021   Reactive airway disease 02/19/2021   Myofascial pain 11/12/2019   Primary osteoarthritis of left knee 11/12/2019   Primary osteoarthritis of right knee 11/12/2019   Cough variant asthma 08/10/2019   Chronic fatigue 04/26/2019   Gastroesophageal reflux disease without esophagitis 04/26/2019   Statin intolerance 06/21/2018   Seasonal allergic rhinitis Murray to pollen 05/19/2018   Fatty liver 05/16/2018   Fibromyalgia 04/25/2018   Major depression, recurrent, chronic (HCC) 04/25/2018   Osteoarthritis, multiple sites 04/25/2018   Essential hypertension 11/09/2016   Type 2 diabetes mellitus without complication, without [place]-term current use of insulin (HCC) 11/09/2016   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   Mixed hyperlipidemia 11/04/2016   B12 deficiency 01/23/2015    Current Outpatient Medications  Medication Sig Dispense Refill   amoxicillin-clavulanate  (AUGMENTIN) 875-125 MG tablet Take 1 tablet by mouth 2 (two) times daily for 10 days. 20 tablet 0   aspirin EC 81 MG tablet Take 1 tablet (81 mg total) by mouth daily. Swallow whole. 90 tablet 3   Bempedoic Acid-Ezetimibe (NEXLIZET) 180-10 MG TABS Take 1 tablet by mouth daily. 90 tablet 3   benzonatate (TESSALON) 100 MG capsule Take 1 capsule (100 mg total) by mouth 3 (three) times daily as needed for cough. 30 capsule 0   Continuous Blood Gluc Receiver (FREESTYLE LIBRE 2 READER) DEVI Use as directed 1 each 0   Continuous Blood Gluc Sensor (FREESTYLE LIBRE 2 SENSOR) MISC USE AS DIRECTED TO TEST BLOOD SUGAR AS DIRECTED BY MD 2 each 5   FLUoxetine (PROZAC) 40 MG capsule TAKE 1 CAPSULE(40 MG) BY MOUTH DAILY 90 capsule 0   fluticasone (FLONASE) 50 MCG/ACT nasal spray SHAKE LIQUID AND USE 2 SPRAYS IN EACH NOSTRIL DAILY 48 g 0   fluticasone-salmeterol (ADVAIR DISKUS) 250-50 MCG/ACT AEPB Inhale 1 puff into the lungs in the morning and at bedtime. 60 each 0   glucose blood test strip Use as instructed: One touch Ultra E11.9 100 each 12   ibuprofen (ADVIL) 600 MG tablet Take 1 tablet by mouth every 6 (six) hours as needed for headache or pain.     influenza vac split quadrivalent PF (FLUARIX QUADRIVALENT) 0.5 ML injection Inject into the muscle. 0.5 mL 0   insulin lispro (HUMALOG KWIKPEN) 100 UNIT/ML KwikPen Inject 6 Units into the skin 3 (three) times daily. 15 mL 11   losartan (COZAAR) 50 MG tablet TAKE 1 TABLET(50 MG) BY MOUTH DAILY 90 tablet 0   ofloxacin (OCUFLOX) 0.3 % ophthalmic solution Place 1 drop into the right eye  4 (four) times daily.     pantoprazole (PROTONIX) 40 MG tablet Take 1 tablet (40 mg total) by mouth daily. 90 tablet 0   traMADol (ULTRAM) 50 MG tablet TAKE 1 TABLET(50 MG) BY MOUTH EVERY 8 HOURS FOR UP TO 5 DAYS AS NEEDED 30 tablet 1   TRESIBA FLEXTOUCH 200 UNIT/ML FlexTouch Pen Inject 50 Units into the skin 2 (two) times daily.     Vitamin D, Ergocalciferol, (DRISDOL) 1.25 MG (50000  UNIT) CAPS capsule Take 1 capsule (50,000 Units total) by mouth every 7 (seven) days. 8 capsule 0   zolpidem (AMBIEN) 10 MG tablet TAKE 1 TABLET BY MOUTH EVERY NIGHT AT BEDTIME AS NEEDED FOR INSOMNIA 30 tablet 2   No current facility-administered medications for this visit.    Allergies: Codeine, Atorvastatin, Cholestyramine, Crestor [rosuvastatin calcium], Fenofibrate, Simvastatin, Soy allergy, Statins, Dulaglutide, Empagliflozin-metformin hcl, Ozempic (0.25 or 0.5 mg-dose) [semaglutide(0.25 or 0.5mg -dos)], Repatha [evolocumab], and Meloxicam  Past Medical History:  Diagnosis Date   Asthma    Atypical chest pain 02/20/2021   B12 deficiency 01/23/2015   Chronic fatigue 04/26/2019   Coronary artery disease moderate based on coronary CT angio of circumflex 04/29/2021   Cough variant asthma 08/10/2019   COVID-19 05/14/2021   Depression    Diabetes mellitus (HCC) 02/19/2021   Diabetes mellitus without complication (HCC)    DKA (diabetic ketoacidosis) (HCC) 09/27/2021   Dyslipidemia, goal to be determined    High triglycerides and LDL   Dyspnea on exertion 0/93/2671   Essential hypertension 11/09/2016   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 Evelene Croon   Mixed hyperlipidemia 11/04/2016   Failed statins; hyperglycemia with repatha. Stopped 06/2018    Myofascial pain 11/12/2019   Obesity Murray to excess calories without serious comorbidity    Osteoarthritis, multiple sites 04/25/2018   Other fatigue 01/20/2022   Other hyperlipidemia 01/20/2022   Pancreatic disease    PCOS (polycystic ovarian syndrome)    Primary osteoarthritis of left knee 11/12/2019   Primary osteoarthritis of right knee 11/12/2019   Prolonged QT interval 09/27/2021   Reactive airway  disease 02/19/2021   Seasonal allergic rhinitis Murray to pollen 05/19/2018   SOBOE (shortness of breath on exertion)    Statin intolerance 06/21/2018   Type 2 diabetes mellitus without complication, without [place]-term current use of insulin (HCC) 11/09/2016   Vitamin D deficiency     Past Surgical History:  Procedure Laterality Date   ABDOMINAL HYSTERECTOMY     CESAREAN SECTION  1994   GALLBLADDER SURGERY  1995   REDUCTION MAMMAPLASTY      Family History  Problem Relation Age of Onset   Heart disease Mother    Hypertension Mother    COPD Mother        Died at 72   Heart failure Mother        Not sure of details   Depression Mother    Liver disease Father    Esophageal cancer Father        Survived cancer treatment and surgery   Drug abuse Father    Cirrhosis Father        NAFLD   Healthy Sister    Diabetes Brother    Depression Brother  Heart attack Brother    Heart disease Brother    Diabetes Maternal Grandmother    Arthritis Maternal Grandmother    Diabetes Maternal Grandfather    Cancer Paternal Grandmother    Hearing loss Paternal Grandmother    Liver disease Paternal Grandfather     Social History   Tobacco Use   Smoking status: Never   Smokeless tobacco: Never  Substance Use Topics   Alcohol use: No    Subjective:    I connected with Megan Murray on 06/29/22 at  2:40 PM EST by a video enabled telemedicine application and verified that I am speaking with the correct person using two identifiers.   I discussed the limitations of evaluation and management by telemedicine and the availability of in person appointments. The patient expressed understanding and agreed to proceed. Provider in office/ patient is at home; provider and patient are only 2 people on video call.   Was seen last week by her PCP with concerns for chronic cough x 1 month; thought to be related to underlying allergies; does not feel that has improved on Omnicef; also unfortunately has not  been able to start the inhaled steroid that was prescribed; feels like Xyzal is too sedating; has noted that blood sugars have been elevated recently;     Objective:  Vitals:   06/29/22 1438  Weight: 219 lb (99.3 kg)    General: Well developed, well nourished, in no acute distress  Skin : Warm and dry.  Head: Normocephalic and atraumatic  Lungs: Respirations unlabored;  Neurologic: Alert and oriented; speech intact; face symmetrical; moves all extremities well; CNII-XII intact without focal deficit   Assessment:  1. Persistent cough for 3 weeks or longer     Plan:  D/C Xyzal since she has found too sedating- can get OTC Zyrtec as alternative; d/c Omnicef- change to Augmentin; she notes she has taken this in the past and done well; Will send in different dosage of Advair in hopes that her pharmacy has in stock for her as opposed to the 100/50 dosage that was ordered last week; She will come get a CXR in the next 1-2 days; she will plan to see her PCP for in person re-check on Friday.   No follow-ups on file.  Orders Placed This Encounter  Procedures   DG Chest 2 View    Standing Status:   Future    Standing Expiration Date:   06/30/2023    Order Specific Question:   Reason for Exam (SYMPTOM  OR DIAGNOSIS REQUIRED)    Answer:   persistent cough x 3 weeks    Order Specific Question:   Is patient pregnant?    Answer:   No    Order Specific Question:   Preferred imaging location?    Answer:   [facility]    Requested Prescriptions   Signed Prescriptions Disp Refills   amoxicillin-clavulanate (AUGMENTIN) 875-125 MG tablet 20 tablet 0    Sig: Take 1 tablet by mouth 2 (two) times daily for 10 days.   fluticasone-salmeterol (ADVAIR DISKUS) 250-50 MCG/ACT AEPB 60 each 0    Sig: Inhale 1 puff into the lungs in the morning and at bedtime.

## 2022-06-30 ENCOUNTER — Ambulatory Visit (HOSPITAL_BASED_OUTPATIENT_CLINIC_OR_DEPARTMENT_OTHER)
Admission: RE | Admit: 2022-06-30 | Discharge: 2022-06-30 | Disposition: A | Discharge status: Home / Self Care | Payer: Medicare Other | Source: Ambulatory Visit | Attending: Family | Admitting: Family

## 2022-06-30 DIAGNOSIS — R053 Chronic cough: Secondary | ICD-10-CM | POA: Insufficient documentation

## 2022-07-01 ENCOUNTER — Other Ambulatory Visit: Payer: Self-pay | Admitting: Medical

## 2022-07-01 ENCOUNTER — Encounter: Payer: Medicare Other | Attending: General Surgery | Admitting: Dietician

## 2022-07-01 ENCOUNTER — Encounter: Payer: Self-pay | Admitting: Dietician

## 2022-07-01 VITALS — Ht 61.0 in | Wt 215.3 lb

## 2022-07-01 DIAGNOSIS — E119 Type 2 diabetes mellitus without complications: Secondary | ICD-10-CM | POA: Insufficient documentation

## 2022-07-01 DIAGNOSIS — E669 Obesity, unspecified: Secondary | ICD-10-CM | POA: Diagnosis present

## 2022-07-01 NOTE — Progress Notes (Signed)
Supervised Weight Loss Visit Bariatric Nutrition Education  Planned surgery: RYGB Pt expectation of surgery: better quality of life; wt loss would be a bonus  3 out of 4 SWL Appointments   NUTRITION ASSESSMENT  Anthropometrics  Start weight at [facility]: 213.0 lbs (date: 04/22/2022)  Height: 61 in Weight today: 215.3 lbs BMI: 40.68 kg/m2     Clinical  Medical hx: arthritis, asthma, depression, DM, hyperlipidemia, HTN, nerve/muscle disease Medications: see list  Labs: vit D 30.85;  Notable signs/symptoms: nothing noted Any previous deficiencies? No  Lifestyle & Dietary Hx  Pt states she has had an upper respiratory infection, stating she can't get over it. Pt states she is getting about 75-90 grams a protein daily. Pt states her blood sugars have been a little higher since she is on medication for respiratory infection. Pt states she is practicing not drinking with meals, stating it is habit now.  Pt states she reviewed all of the pre-op goals and states she feels that she is practicing all of them. Pt states that her husband told her she is doing such a good job with her goals, stating he is a man of few words, and it was nice to hear him say that. Pt is doing great, and has been disciplined for many years with her diabetes.  Estimated daily fluid intake: 80 oz Supplements: Vit D Current average weekly physical activity: not much the past few weeks, due to upper respiratory infection.  Usually: Walking 30 minutes 3-4 days a week, stating the loop is a little over 2 miles.  24-Hr Dietary Recall First Meal: 2 eggs (boiled or scrambled) with vegetables when scrambled Snack: protein shake Second Meal: deli meat and cheese wrap or salad or left overs or chicken salad Snack:  Third Meal: protein/meat and non-starchy vegetables Snack: sometimes a protein shake or cucumbers. Beverages: water, hot tea unsweetened  Estimated Energy Needs Calories: 1500  NUTRITION DIAGNOSIS   Overweight/obesity ([place]-3.3) related to past poor dietary habits and physical inactivity as evidenced by patient w/ planned RYGB surgery following dietary guidelines for continued weight loss.  NUTRITION INTERVENTION  Nutrition counseling (C-1) and education (E-2) to facilitate bariatric surgery goals.  Physical activity is an important part of a healthy lifestyle so keep it moving! The goal is to reach 150 minutes of exercise per week, including cardiovascular and weight baring activity.  Pre-Op Goals Progress & New Goals Continue: practice not drinking with meals Continue: not drinking sodas/carbonated beverages Continue/Re-engage: Physical activity after respiratory infection Continue: Track protein intake per day  Handouts Provided Include  Benefits of Physical Activity handout  Learning Style & Readiness for Change Teaching method utilized: Visual & Auditory  Demonstrated degree of understanding via: Teach Back  Readiness Level: Action Barriers to learning/adherence to lifestyle change: none identified  RD's Notes for next Visit  Progress toward patient's chosen goals   MONITORING & EVALUATION Dietary intake, weekly physical activity, body weight, and pre-op goals in 1 month.   Next Steps  Patient is to return to [facility] in one month for next SW[facility] visit.

## 2022-07-02 ENCOUNTER — Telehealth (INDEPENDENT_AMBULATORY_CARE_PROVIDER_SITE_OTHER): Payer: Medicare Other | Admitting: Medical

## 2022-07-02 ENCOUNTER — Encounter: Payer: Self-pay | Admitting: Medical

## 2022-07-02 VITALS — BP 150/68 | HR 85 | Temp 98.8°F | Resp 18 | Ht 61.0 in | Wt 215.6 lb

## 2022-07-02 DIAGNOSIS — J45991 Cough variant asthma: Secondary | ICD-10-CM

## 2022-07-02 MED ORDER — HYDROCODONE BIT-HOMATROP MBR 5-1.5 MG/5ML PO SOLN: 5.0000 mL | Freq: Three times a day (TID) | ORAL | 0 refills | Status: DC | PRN

## 2022-07-02 NOTE — Patient Instructions (Addendum)
Uri vs allergic rhintis followed by asthma flare associated with moderate to severe cough.  Continue augmentin, recommend try claritin(stop zyrtec), continue advair and rx hycodan for cough. Rx advisement given regarding benefit vs risk. We discussed our prior use of oxycdone years ago with no side effect and tramadol use no side effect. When using hycodan discontinue tramadol.   If signs/symptoms worsening despite above would refer to pulmonlogist.  Follow up in 7 days or sooner if needed.  Called xray dept and got them to read cxr today. Report was negative. Sent pt my chart message.

## 2022-07-02 NOTE — Progress Notes (Signed)
   Subjective:    Patient ID: Megan Murray, female    DOB: Nov 09, 1965, 56 y.o.   MRN: [id number]  HPI Virtual Visit via Video Note  I connected with Megan Murray on 07/02/22 at 10:20 AM EST by a video enabled telemedicine application and verified that I am speaking with the correct person using two identifiers.  Location: Patient: home Provider: office   I discussed the limitations of evaluation and management by telemedicine and the availability of in person appointments. The patient expressed understanding and agreed to proceed.  History of Present Illness:   Pt seen by Ria Clock NP the other day.  Plan:  D/C Xyzal since she has found too sedating- can get OTC Zyrtec as alternative; d/c Omnicef- change to Augmentin; she notes she has taken this in the past and done well; Will send in different dosage of Advair in hopes that her pharmacy has in stock for her as opposed to the 100/50 dosage that was ordered last week; She will come get a CXR in the next 1-2 days; she will plan to see her PCP for in person re-check on Friday.   "Pt states she has not noticed difference in her cough. Last night had severe coughing episode. No obvious severe wheezing or shortness reported.   When he I had seen her on 13 th below is that hpi.  Pt over past month has had some nasal congestion, pnd and cough. Pt states history of fall and spring allergies. Pt states has just used albuterol as she will notice some occasional wheezing.   Both dry and productive cough. At times.   No fevers, no chills or sweats.   Pt using albuterol 2-3 times a week.   Pt only using saline nasal spray."  Pt expressed frustration that she had not got called on xray result. I did not order xray. Looks like order routine by Ria Clock NP who saw pt last.   Observations/Objective: General-no acute distress, pleasant, oriented. Lungs- on inspection lungs appear unlabored. Neck- no tracheal deviation or jvd on  inspection. Neuro- gross motor function appears intact.   Assessment and Plan:  Patient Instructions  Glenford Peers vs allergic rhintis followed by asthma flare associated with moderate to severe cough.  Continue augmentin, recommend try claritin(stop zyrtec), continue advair and rx hycodan for cough. Rx advisement given regarding benefit vs risk. We discussed our prior use of oxycdone years ago with no side effect and tramadol use no side effect. When using hycodan discontinue tramadol.   If signs/symptoms worsening despite above would refer to pulmonlogist.  Follow up in 7 days or sooner if needed.  Follow Up Instructions:    I discussed the assessment and treatment plan with the patient. The patient was provided an opportunity to ask questions and all were answered. The patient agreed with the plan and demonstrated an understanding of the instructions.   The patient was advised to call back or seek an in-person evaluation if the symptoms worsen or if the condition fails to improve as anticipated.     Esperanza Richters, PA-C    Review of Systems     Objective:   Physical Exam        Assessment & Plan:

## 2022-07-04 ENCOUNTER — Encounter: Payer: Self-pay | Admitting: Medical

## 2022-07-04 MED ORDER — HYDROCODONE BIT-HOMATROP MBR 5-1.5 MG/5ML PO SOLN: 5.0000 mL | Freq: Three times a day (TID) | ORAL | 0 refills | Status: DC | PRN

## 2022-07-04 NOTE — Addendum Note (Signed)
Addended by: Gwenevere Abbot on: 07/04/2022 08:21 AM   Modules accepted: Orders

## 2022-07-12 ENCOUNTER — Other Ambulatory Visit (HOSPITAL_BASED_OUTPATIENT_CLINIC_OR_DEPARTMENT_OTHER): Payer: Self-pay | Admitting: Medical

## 2022-07-12 DIAGNOSIS — Z1231 Encounter for screening mammogram for malignant neoplasm of breast: Secondary | ICD-10-CM

## 2022-07-13 ENCOUNTER — Ambulatory Visit (HOSPITAL_BASED_OUTPATIENT_CLINIC_OR_DEPARTMENT_OTHER)
Admission: RE | Admit: 2022-07-13 | Discharge: 2022-07-13 | Disposition: A | Discharge status: Home / Self Care | Payer: Medicare Other | Source: Ambulatory Visit | Attending: Medical | Admitting: Medical

## 2022-07-13 DIAGNOSIS — Z1231 Encounter for screening mammogram for malignant neoplasm of breast: Secondary | ICD-10-CM | POA: Insufficient documentation

## 2022-07-22 ENCOUNTER — Telehealth: Payer: Self-pay

## 2022-07-22 NOTE — Telephone Encounter (Signed)
Nexlizet approved from 07/12/22-07/12/23. #[id number]

## 2022-07-28 ENCOUNTER — Telehealth: Payer: Self-pay

## 2022-07-28 NOTE — Telephone Encounter (Signed)
LM informing the patient Nexlizet approved and letter scanned under media

## 2022-07-29 ENCOUNTER — Other Ambulatory Visit: Payer: Self-pay | Admitting: Medical

## 2022-07-29 DIAGNOSIS — M159 Polyosteoarthritis, unspecified: Secondary | ICD-10-CM

## 2022-07-29 DIAGNOSIS — M797 Fibromyalgia: Secondary | ICD-10-CM

## 2022-07-30 ENCOUNTER — Other Ambulatory Visit (HOSPITAL_COMMUNITY): Payer: Self-pay

## 2022-08-04 ENCOUNTER — Telehealth: Payer: Self-pay | Admitting: Skilled Nursing Facility1

## 2022-08-04 ENCOUNTER — Telehealth (HOSPITAL_COMMUNITY): Payer: Self-pay | Admitting: *Deleted

## 2022-08-04 NOTE — Telephone Encounter (Signed)
Called pt in regards to Megan Murray stating pt had some questions about blood sugar management during the post op diet.

## 2022-08-04 NOTE — Telephone Encounter (Signed)
Pt states she does see an endocrinologist.  Pt states sugar free options with sucralose raise her blood sugar numbers.    Pt states she cannot eat any carbohydrates or she will get a spike of blood sugar.   Taking Mahrt acting insulin 65 units in the morning and evening and then sliding scale Humalog going from 8-14 units taking 10 minutes before eating any meal  Pt states she will be starting an insulin pump.   Pt states she is tired very often and sick of her current diet.   Pt states she has a clear monk fruit sweetened protein and can handle Fairlife protein shake.  Dietitian offered the use of Isopure unflavored protein powder as another option.   Dietitian is confident with the insulin pump her blood sugars will be better managed but will contact Dr. Chalmers Cater in collaboration of care.

## 2022-08-04 NOTE — Telephone Encounter (Signed)
This RN received communication from the [place] and [place] regarding the pt desiring some guidance regarding diet and blood glucose concerns. Dietician was able to reach out to the pt as well.   Pt stated that she did not have any additional questions or concerns. Reminded pt of coordinator number and contact information to reach out should the need arise.

## 2022-08-05 ENCOUNTER — Encounter: Payer: Self-pay | Admitting: Dietician

## 2022-08-05 ENCOUNTER — Encounter: Payer: Medicare Other | Attending: General Surgery | Admitting: Dietician

## 2022-08-05 VITALS — Ht 61.0 in | Wt 219.9 lb

## 2022-08-05 DIAGNOSIS — E119 Type 2 diabetes mellitus without complications: Secondary | ICD-10-CM | POA: Diagnosis present

## 2022-08-05 DIAGNOSIS — E669 Obesity, unspecified: Secondary | ICD-10-CM | POA: Insufficient documentation

## 2022-08-05 NOTE — Progress Notes (Signed)
Supervised Weight Loss Visit Bariatric Nutrition Education  Planned surgery: RYGB Pt expectation of surgery: better quality of life; wt loss would be a bonus  4 out of 4 SWL Appointments   Pt completed visits. Pt is compliant/ready to implement the required dietary and lifestyle changes.  Pt has cleared nutrition requirements.   NUTRITION ASSESSMENT  Anthropometrics  Start weight at [facility]: 213.0 lbs (date: 04/22/2022)  Height: 61 in Weight today: 219.9 lbs BMI: 41.55 kg/m2     Clinical  Medical hx: arthritis, asthma, depression, DM, hyperlipidemia, HTN, nerve/muscle disease Medications: see list  Labs: vit D 30.85; A1c 7.1 (per patient) Notable signs/symptoms: nothing noted Any previous deficiencies? No  Lifestyle & Dietary Hx  Pt states her endocrinologist wants to start her on an insulin pump. Pt states Ozempic put her in the hospital and caused trouble with her blood sugars, stating she got DKA and pancreatitis. Pt states her endocrinologist states she is borderline "brittle" diabetes. Pt states her Dr. Burna Sis the pump will help. Pt states her endocrinologist is against the idea of bariatric surgery. Pt states she joined [employer], stating she walking 4-5 days a week for 3 miles each time. Pt states she like to walk the track. Pt states the exercise helps with blood sugars, but not for her fibromyalgia. Pt states that [place] is not far, and is encouraged by the distance from her home, stating she has been sticking with it. Pt states she can do monk fruit and stevia, but not sucralose.  Pt states she is getting over 60 grams of protein daily. Pt states she has practiced all of the pre-op goals and has created patterns and routines for success after surgery.  Estimated daily fluid intake: 80 oz Supplements: Vit D Current average weekly physical activity: walking 4-5 days a week for 3 miles each time, [place] track.  24-Hr Dietary Recall First Meal: 2 eggs (boiled or  scrambled) with vegetables when scrambled Snack: protein shake Second Meal: deli meat and cheese wrap or salad or left overs or chicken salad Snack:  Third Meal: protein/meat and non-starchy vegetables Snack: sometimes a protein shake or cucumbers. Beverages: water, hot tea unsweetened  Estimated Energy Needs Calories: 1500  NUTRITION DIAGNOSIS  Overweight/obesity ([place]-3.3) related to past poor dietary habits and physical inactivity as evidenced by patient w/ planned RYGB surgery following dietary guidelines for continued weight loss.  NUTRITION INTERVENTION  Nutrition counseling (C-1) and education (E-2) to facilitate bariatric surgery goals.  Why you need complex carbohydrates: Whole grains and other complex carbohydrates are required to have a healthy diet. Whole grains provide fiber which can help with blood glucose levels and help keep you satiated. Fruits and starchy vegetables provide essential vitamins and minerals required for immune function, eyesight support, brain support, bone density, wound healing and many other functions within the body. According to the current evidenced based 2020-2025 Dietary Guidelines for Americans, complex carbohydrates are part of a healthy eating pattern which is associated with a decreased risk for type 2 diabetes, cancers, and cardiovascular disease.  Discussed patient's extreme insulin resistance. Encouraged pt to increase complex carbohydrates, while coordinating insulin/medication with her endocrinologist. Discussed the insulin pump to help manage blood sugars while incorporating complex carbohydrates from whole foods (whole grains, fruit, vegetables). Due to patients fear of carbohydrates raising blood sugars, she is resistant to the education.  Physical activity is an important part of a healthy lifestyle so keep it moving! The goal is to reach 150 minutes of exercise per week, including  cardiovascular and weight baring activity. Discussed physical  activity goals, this can include any activity desired including swimming, walking, jogging, biking, etc. Recommended exercise goal: a total of 5-7 days/week. 60-90 minutes  Discussed the food/diet progression pre-op and after surgery, including the liquid, and soft prepped plan phase 1 and phase 2, including lists of foods within the phases. Pt agreeable to education and all questions answered to her satisfaction  Pre-Op Goals Progress & New Goals Continue: practice not drinking with meals Continue: not drinking sodas/carbonated beverages Continue/Re-engage: Physical activity at [employer] Continue: Track protein intake per day  Handouts Provided Include  Pre-Op goals sheet (reviewed)  Learning Style & Readiness for Change Teaching method utilized: Visual & Auditory  Demonstrated degree of understanding via: Teach Back  Readiness Level: preparation Barriers to learning/adherence to lifestyle change: none identified  RD's Notes for next Visit  Progress toward patient's chosen goals  MONITORING & EVALUATION Dietary intake, weekly physical activity, body weight, and pre-op goals in 1 month.   Next Steps  Pt has completed visits. No further supervised visits required/recomended  Patient is to follow up at [facility] for Pre-Op Class >2 weeks before surgery for further nutrition education.

## 2022-08-12 ENCOUNTER — Other Ambulatory Visit: Payer: Self-pay | Admitting: Medical

## 2022-08-22 ENCOUNTER — Other Ambulatory Visit: Payer: Self-pay | Admitting: Medical

## 2022-08-22 DIAGNOSIS — F5101 Primary insomnia: Secondary | ICD-10-CM

## 2022-08-23 NOTE — Telephone Encounter (Signed)
Requesting: ambien Contract:06/23/22 UDS:06/23/22 Last Visit:06/23/22 Next Visit:n/a Last Refill:05/31/22  Please Advise

## 2022-08-23 NOTE — Telephone Encounter (Signed)
Rx refill sent to pharamacy.

## 2022-09-01 ENCOUNTER — Other Ambulatory Visit: Payer: Self-pay | Admitting: Medical

## 2022-09-01 NOTE — Telephone Encounter (Signed)
Is pt still taking medciation , epic states you discontinued it on 07/04/22

## 2022-09-02 NOTE — Progress Notes (Signed)
Surgery orders requested via Epic inbox. °

## 2022-09-03 ENCOUNTER — Ambulatory Visit: Payer: Self-pay | Admitting: General Surgery

## 2022-09-03 DIAGNOSIS — E6609 Other obesity due to excess calories: Secondary | ICD-10-CM

## 2022-09-03 DIAGNOSIS — E119 Type 2 diabetes mellitus without complications: Secondary | ICD-10-CM

## 2022-09-03 DIAGNOSIS — K760 Fatty (change of) liver, not elsewhere classified: Secondary | ICD-10-CM

## 2022-09-03 NOTE — Telephone Encounter (Signed)
Pt does have a contract and UDS for it -- 07/04/23 , epic stated you d/c'd back 12/23

## 2022-09-04 NOTE — Telephone Encounter (Signed)
Was about to give refill but 3 drug warming popped up. How is she doing with her pain? May need to virtual visit to discuss clarify these warnings.

## 2022-09-06 ENCOUNTER — Encounter: Payer: Medicare Other | Attending: General Surgery | Admitting: Skilled Nursing Facility1

## 2022-09-06 ENCOUNTER — Encounter: Payer: Self-pay | Admitting: *Deleted

## 2022-09-06 VITALS — Wt 219.9 lb

## 2022-09-06 DIAGNOSIS — E119 Type 2 diabetes mellitus without complications: Secondary | ICD-10-CM | POA: Insufficient documentation

## 2022-09-06 DIAGNOSIS — E669 Obesity, unspecified: Secondary | ICD-10-CM | POA: Insufficient documentation

## 2022-09-06 NOTE — Progress Notes (Addendum)
Anesthesia Review:  PCP:  Mackie Pai- LOV 07/02/22  Cardiologist : Revankar- [place] 05/13/22  Endocrinologist- DR Chalmers Cater appt on 09/08/22 after preop appt  Chest x-ray : 07/02/22- 2 view  EKG : 05/13/22  Echo : 03/11/21  Stress test: 05/17/22  Ct cors- 03/10/21  Cardiac Cath :  Activity level:  Sleep Study/ CPAP : Fasting Blood Sugar :      / Checks Blood Sugar -- times a day:   Blood Thinner/ Instructions /Last Dose: ASA / Instructions/ Last Dose :    DM- type 2 Freestyle Libre 2 - glucose monitor  Hgba1c-  09/08/22 - 6.8   Pt cannot drink G2 due to sucralose content.  Pt states it drives her glucose up.  PT not given G2 for that reason and instructed to  hydrate before 0430am of surgery with water or tea.  PT voiced understanding.   PT seeing  endocrinologist on 09/08/22 in regards to surgery.  Instructed pt if DR Chalmers Cater changes Tyler Aas or Insulin dosing for preop to please call and notify preo nurse at [phone].  PT voiced understanding.

## 2022-09-06 NOTE — Telephone Encounter (Signed)
Mychart message sent to patient.

## 2022-09-07 ENCOUNTER — Encounter (HOSPITAL_BASED_OUTPATIENT_CLINIC_OR_DEPARTMENT_OTHER): Payer: Self-pay | Admitting: Pharmacist Clinician (PhC)/ Clinical Pharmacy Specialist

## 2022-09-07 ENCOUNTER — Encounter: Payer: Self-pay | Admitting: Skilled Nursing Facility1

## 2022-09-07 NOTE — Progress Notes (Signed)
Pre-Operative Nutrition Class:    Patient was seen on 09/07/2022 for Pre-Operative Bariatric Surgery Education at [facility]    Surgery date: 09/21/2022 Surgery type: RYGB Start weight at [facility]: 213 Weight today: 219.9 pounds  Pt states her insurance will not cover the insulin pump due to her pancreas still having enough function.   Try extend products and/or cornstarch in almond milk for glucose stabilization pre-surgery and cornstarch in almond milk post surgery. Ensure you are in communication with your endocrinologist and the diabetes educator in [place] states she cannot tolerate sugar alternatives: dietitian offered several fluid options without those products for the liquid phase after surgery.   Pt states she has 3+ protein shake options for after surgery she tolerates well.    The following the learning objectives were met by the patient during this course: Identify Pre-Op Dietary Goals and will begin 2 weeks pre-operatively Identify appropriate sources of fluids and proteins  State protein recommendations and appropriate sources pre and post-operatively Identify Post-Operative Dietary Goals and will follow for 2 weeks post-operatively Identify appropriate multivitamin and calcium sources Describe the need for physical activity post-operatively and will follow MD recommendations State when to call healthcare provider regarding medication questions or post-operative complications When having a diagnosis of diabetes understanding hypoglycemia symptoms and the inclusion of 1 complex carbohydrate per meal  Handouts given during class include: Pre-Op Bariatric Surgery Diet Handout Protein Shake Handout Post-Op Bariatric Surgery Nutrition Handout BELT Program Information Flyer Support Group Information Flyer [facility] Bariatric Supplements Price List  Follow-Up Plan: Patient will follow-up at [facility] 2 weeks post operatively for  diet advancement per MD.

## 2022-09-07 NOTE — Patient Instructions (Signed)
SURGICAL WAITING ROOM VISITATION  Patients having surgery or a procedure may have no more than 2 support people in the waiting area - these visitors may rotate.    Children under the age of 58 must have an adult with them who is not the patient.  Due to an increase in RSV and influenza rates and associated hospitalizations, children ages 84 and under may not visit patients in [place].  If the patient needs to stay at the hospital during part of their recovery, the visitor guidelines for inpatient rooms apply. Pre-op nurse will coordinate an appropriate time for 1 support person to accompany patient in pre-op.  This support person may not rotate.    Please refer to the [facility] website for the visitor guidelines for Inpatients (after your surgery is over and you are in a regular room).       Your procedure is scheduled on:  09/21/22    Report to [facility] Main Entrance    Report to admitting at  [place] AM   Call this number if you have problems the morning of surgery [phone]   Do not eat food :After Midnight.   After Midnight you may have the following liquids until _ 0430_____ AM DAY OF SURGERY  Water Non-Citrus Juices (without pulp, NO RED-Apple, White grape, White cranberry) Black Coffee (NO MILK/CREAM OR CREAMERS, sugar ok)  Clear Tea (NO MILK/CREAM OR CREAMERS, sugar ok) regular and decaf                             Plain Jell-O (NO RED)                                           Fruit ices (not with fruit pulp, NO RED)                                     Popsicles (NO RED)                                                               Sports drinks like Gatorade (NO RED)                    The day of surgery:  Drink ONE (1) Pre-Surgery Clear Ensure or G2 at  0430 ( have completed by )  AM the morning of surgery. Drink in one sitting. Do not sip.  This drink was given to you during your hospital  pre-op appointment visit. Nothing else to  drink after completing the  Pre-Surgery Clear Ensure or G2.          If you have questions, please contact your surgeon's office.       Oral Hygiene is also important to reduce your risk of infection.                                    Remember - BRUSH YOUR TEETH THE MORNING OF SURGERY WITH  YOUR REGULAR TOOTHPASTE  DENTURES WILL BE REMOVED PRIOR TO SURGERY PLEASE DO NOT APPLY "Poly grip" OR ADHESIVES!!!   Do NOT smoke after Midnight   Take these medicines the morning of surgery with A SIP OF WATER:  Inhalers as usual and bring, prozac   DO NOT TAKE ANY ORAL DIABETIC MEDICATIONS DAY OF YOUR SURGERY  Bring CPAP mask and tubing day of surgery.                              You may not have any metal on your body including hair pins, jewelry, and body piercing             Do not wear make-up, lotions, powders, perfumes/cologne, or deodorant  Do not wear nail polish including gel and S&S, artificial/acrylic nails, or any other type of covering on natural nails including finger and toenails. If you have artificial nails, gel coating, etc. that needs to be removed by a nail salon please have this removed prior to surgery or surgery may need to be canceled/ delayed if the surgeon/ anesthesia feels like they are unable to be safely monitored.   Do not shave  48 hours prior to surgery.               Men may shave face and neck.   Do not bring valuables to the hospital. [place].   Contacts, glasses, dentures or bridgework may not be worn into surgery.   Bring small overnight bag day of surgery.   DO NOT [place]. PHARMACY WILL DISPENSE MEDICATIONS LISTED ON YOUR MEDICATION LIST TO YOU DURING YOUR ADMISSION [place]!    Patients discharged on the day of surgery will not be allowed to drive home.  Someone NEEDS to stay with you for the first 24 hours after anesthesia.   Special Instructions:  Bring a copy of your healthcare power of attorney and living will documents the day of surgery if you haven't scanned them before.              Please read over the following fact sheets you were given: IF [place] [phone]   If you received a COVID test during your pre-op visit  it is requested that you wear a mask when out in public, stay away from anyone that may not be feeling well and notify your surgeon if you develop symptoms. If you test positive for Covid or have been in contact with anyone that has tested positive in the last 10 days please notify you surgeon.    [place] - Preparing for Surgery Before surgery, you can play an important role.  Because skin is not sterile, your skin needs to be as free of germs as possible.  You can reduce the number of germs on your skin by washing with CHG (chlorahexidine gluconate) soap before surgery.  CHG is an antiseptic cleaner which kills germs and bonds with the skin to continue killing germs even after washing. Please DO NOT use if you have an allergy to CHG or antibacterial soaps.  If your skin becomes reddened/irritated stop using the CHG and inform your nurse when you arrive at Short Stay. Do not shave (including legs and underarms) for at least 48 hours prior  to the first CHG shower.  You may shave your face/neck. Please follow these instructions carefully:  1.  Shower with CHG Soap the night before surgery and the  morning of Surgery.  2.  If you choose to wash your hair, wash your hair first as usual with your  normal  shampoo.  3.  After you shampoo, rinse your hair and body thoroughly to remove the  shampoo.                           4.  Use CHG as you would any other liquid soap.  You can apply chg directly  to the skin and wash                       Gently with a scrungie or clean washcloth.  5.  Apply the CHG Soap to your body ONLY FROM THE NECK DOWN.   Do not use on face/ open                            Wound or open sores. Avoid contact with eyes, ears mouth and genitals (private parts).                       Wash face,  Genitals (private parts) with your normal soap.             6.  Wash thoroughly, paying special attention to the area where your surgery  will be performed.  7.  Thoroughly rinse your body with warm water from the neck down.  8.  DO NOT shower/wash with your normal soap after using and rinsing off  the CHG Soap.                9.  Pat yourself dry with a clean towel.            10.  Wear clean pajamas.            11.  Place clean sheets on your bed the night of your first shower and do not  sleep with pets. Day of Surgery : Do not apply any lotions/deodorants the morning of surgery.  Please wear clean clothes to the hospital/surgery center.  FAILURE TO FOLLOW THESE INSTRUCTIONS MAY RESULT IN THE CANCELLATION OF YOUR SURGERY PATIENT SIGNATURE_________________________________  NURSE SIGNATURE__________________________________  ________________________________________________________________________

## 2022-09-08 ENCOUNTER — Telehealth: Payer: Medicare Other | Admitting: Medical

## 2022-09-08 ENCOUNTER — Encounter (HOSPITAL_COMMUNITY)
Admission: RE | Admit: 2022-09-08 | Discharge: 2022-09-08 | Disposition: A | Discharge status: Home / Self Care | Payer: Medicare Other | Source: Ambulatory Visit | Attending: General Surgery | Admitting: General Surgery

## 2022-09-08 ENCOUNTER — Encounter (HOSPITAL_COMMUNITY): Payer: Self-pay

## 2022-09-08 ENCOUNTER — Other Ambulatory Visit: Payer: Self-pay

## 2022-09-08 VITALS — BP 150/84 | HR 80 | Temp 98.3°F | Resp 16 | Ht 61.0 in | Wt 216.0 lb

## 2022-09-08 DIAGNOSIS — E6609 Other obesity due to excess calories: Secondary | ICD-10-CM | POA: Insufficient documentation

## 2022-09-08 DIAGNOSIS — Z01812 Encounter for preprocedural laboratory examination: Secondary | ICD-10-CM | POA: Insufficient documentation

## 2022-09-08 DIAGNOSIS — Z01818 Encounter for other preprocedural examination: Secondary | ICD-10-CM

## 2022-09-08 DIAGNOSIS — K760 Fatty (change of) liver, not elsewhere classified: Secondary | ICD-10-CM | POA: Diagnosis not present

## 2022-09-08 DIAGNOSIS — E119 Type 2 diabetes mellitus without complications: Secondary | ICD-10-CM | POA: Insufficient documentation

## 2022-09-08 HISTORY — DX: Anxiety disorder, unspecified: F41.9

## 2022-09-08 HISTORY — DX: Nausea with vomiting, unspecified: Z98.890

## 2022-09-08 HISTORY — DX: Pneumonia, unspecified organism: J18.9

## 2022-09-08 HISTORY — DX: Nausea with vomiting, unspecified: R11.2

## 2022-09-08 LAB — CBC WITH DIFFERENTIAL/PLATELET
Abs Immature Granulocytes: 0.04 10*3/uL (ref 0.00–0.07)
Basophils Absolute: 0.1 10*3/uL (ref 0.0–0.1)
Basophils Relative: 1 %
Eosinophils Absolute: 0.2 10*3/uL (ref 0.0–0.5)
Eosinophils Relative: 3 %
HCT: 45.5 % (ref 36.0–46.0)
Hemoglobin: 14.7 g/dL (ref 12.0–15.0)
Immature Granulocytes: 1 %
Lymphocytes Relative: 28 %
Lymphs Abs: 2.4 10*3/uL (ref 0.7–4.0)
MCH: 29.2 pg (ref 26.0–34.0)
MCHC: 32.3 g/dL (ref 30.0–36.0)
MCV: 90.5 fL (ref 80.0–100.0)
Monocytes Absolute: 0.6 10*3/uL (ref 0.1–1.0)
Monocytes Relative: 7 %
Neutro Abs: 5.3 10*3/uL (ref 1.7–7.7)
Neutrophils Relative %: 60 %
Platelets: 262 10*3/uL (ref 150–400)
RBC: 5.03 MIL/uL (ref 3.87–5.11)
RDW: 13 % (ref 11.5–15.5)
WBC: 8.6 10*3/uL (ref 4.0–10.5)
nRBC: 0 % (ref 0.0–0.2)

## 2022-09-08 LAB — COMPREHENSIVE METABOLIC PANEL
ALT: 23 U/L (ref 0–44)
AST: 20 U/L (ref 15–41)
Albumin: 4 g/dL (ref 3.5–5.0)
Alkaline Phosphatase: 63 U/L (ref 38–126)
Anion gap: 8 (ref 5–15)
BUN: 14 mg/dL (ref 6–20)
CO2: 25 mmol/L (ref 22–32)
Calcium: 9.2 mg/dL (ref 8.9–10.3)
Chloride: 104 mmol/L (ref 98–111)
Creatinine, Ser: 0.47 mg/dL (ref 0.44–1.00)
GFR, Estimated: >60 mL/min (ref >60)
Glucose, Bld: 160 mg/dL — ABNORMAL HIGH (ref 70–99)
Potassium: 3.9 mmol/L (ref 3.5–5.1)
Sodium: 137 mmol/L (ref 135–145)
Total Bilirubin: 0.5 mg/dL (ref 0.3–1.2)
Total Protein: 7.1 g/dL (ref 6.5–8.1)

## 2022-09-08 LAB — TYPE AND SCREEN
ABO/RH(D): A POS
Antibody Screen: NEGATIVE

## 2022-09-08 LAB — GLUCOSE, CAPILLARY: Glucose-Capillary: 170 mg/dL — ABNORMAL HIGH (ref 70–99)

## 2022-09-09 ENCOUNTER — Telehealth: Payer: Self-pay | Admitting: Dietician

## 2022-09-09 ENCOUNTER — Telehealth: Payer: Self-pay | Admitting: Medical

## 2022-09-09 LAB — HEMOGLOBIN A1C
Hgb A1c MFr Bld: 6.8 % — ABNORMAL HIGH (ref 4.8–5.6)
Mean Plasma Glucose: 148 mg/dL

## 2022-09-09 NOTE — Telephone Encounter (Signed)
Contacted Megan Murray to schedule their annual wellness visit. Patient declined to schedule AWV at this time.  Sherol Dade; Care Guide Amb[facility] Direct Dial: [phone]

## 2022-09-09 NOTE — Telephone Encounter (Signed)
Pt called about pre-op diet.  Pt states her surgeon advised that she follow the pre-op diet.  Dietitian at the time of pre-op class advised pt to not follow the pre-op diet.  Pt's BMI on 09/07/2022 at the time of the pre-op class was 41.55, which is below 42 BMI agreed upon as a cut off for the participating in the pre-op diet (liver shrinking diet).   Pt states surgeon would like for her to follow the pre-op diet and substitute one meal with a protein shake, based on body shape, and carrying her weight in her mid section. Dietitian advised patient to follow surgeons recommendations to follow the pre-op diet and substitute one meal with a protein shake.

## 2022-09-10 ENCOUNTER — Telehealth (INDEPENDENT_AMBULATORY_CARE_PROVIDER_SITE_OTHER): Payer: Medicare Other | Admitting: Medical

## 2022-09-10 DIAGNOSIS — Z794 Long term (current) use of insulin: Secondary | ICD-10-CM

## 2022-09-10 DIAGNOSIS — F3289 Other specified depressive episodes: Secondary | ICD-10-CM

## 2022-09-10 DIAGNOSIS — M797 Fibromyalgia: Secondary | ICD-10-CM | POA: Diagnosis not present

## 2022-09-10 DIAGNOSIS — E119 Type 2 diabetes mellitus without complications: Secondary | ICD-10-CM

## 2022-09-10 DIAGNOSIS — Z6836 Body mass index (BMI) 36.0-36.9, adult: Secondary | ICD-10-CM

## 2022-09-10 MED ORDER — TRAMADOL HCL 50 MG PO TABS: ORAL_TABLET | ORAL | 0 refills | Status: DC

## 2022-09-10 MED ORDER — FLUOXETINE HCL 40 MG PO CAPS
40.0000 mg | ORAL_CAPSULE | Freq: Every day | ORAL | 3 refills | Status: DC
  Filled 2023-06-16: qty 90, 90d supply, fill #0

## 2022-09-10 NOTE — Patient Instructions (Addendum)
Depression controlled presently. Refilled prozac today. On discussion pt prepared for upcoming surgery.  2. Fibromyalgia Controlled with occasional tramadol. Rx refill sent today. Up to date on contract and uds.  3. Type 2 diabetes mellitus without complication, with [place]-term current use of insulin ([place]) Managed by endocrinoloigist.  4. Obesity- upcoming gastric bypass surgery September 21, 2022. Pt has met with all providers.  5. Follow up 6 months or sooner if needed.

## 2022-09-10 NOTE — Progress Notes (Signed)
   Subjective:    Patient ID: Megan Murray, female    DOB: 1965/09/04, 57 y.o.   MRN: [id number]  HPI  Virtual Visit via Video Note  I connected with Megan Murray on 09/10/22 at  1:40 PM EST by a video enabled telemedicine application and verified that I am speaking with the correct person using two identifiers.  Location: Patient: home Provider: office   I discussed the limitations of evaluation and management by telemedicine and the availability of in person appointments. The patient expressed understanding and agreed to proceed.  History of Present Illness: Pt is scheduled for gastric bypass surgery 09-21-2022.  Pt has fibromyalgia. She uses tramadol and she takes it usually at most one tab daily. Pt is up to date on contract and gave uds.   Pt needs tramadol refill of tramadol.   Depression- well controlled with prozac.  Observations/Objective:  General-no acute distress, pleasant, oriented. Lungs- on inspection lungs appear unlabored. Neck- no tracheal deviation or jvd on inspection. Neuro- gross motor function appears intact.   Assessment and Plan: Patient Instructions  Depression controlled presently. Refilled prozac today. On discussion pt prepared for upcoming surgery.  2. Fibromyalgia Controlled with occasional tramadol. Rx refill sent today. Up to date on contract and uds.  3. Type 2 diabetes mellitus without complication, with [place]-term current use of insulin ([place]) Managed by endocrinoloigist.  4. Obesity- upcoming gastric bypass surgery September 21, 2022. Pt has met with all providers.  5. Follow up 6 months or sooner if needed.     Mackie Pai, PA-C   Follow Up Instructions:    I discussed the assessment and treatment plan with the patient. The patient was provided an opportunity to ask questions and all were answered. The patient agreed with the plan and demonstrated an understanding of the instructions.   The patient was advised to call back or  seek an in-person evaluation if the symptoms worsen or if the condition fails to improve as anticipated.     Mackie Pai, PA-C    Review of Systems  Constitutional:  Negative for chills, fatigue and fever.  Respiratory:  Negative for cough, chest tightness, shortness of breath and wheezing.   Cardiovascular:  Negative for chest pain and palpitations.  Gastrointestinal:  Negative for abdominal pain.  Musculoskeletal:  Negative for back pain.  Neurological:  Negative for dizziness.  Psychiatric/Behavioral:  Positive for dysphoric mood. Negative for behavioral problems and decreased concentration. The patient is not nervous/anxious.        Mood controlled. Doing well.       Objective:   Physical Exam        Assessment & Plan:

## 2022-09-15 ENCOUNTER — Telehealth: Payer: Self-pay

## 2022-09-15 NOTE — Telephone Encounter (Signed)
PA initiated via Covermymeds; KEY: [id number]. Awaiting determination.

## 2022-09-15 NOTE — Telephone Encounter (Signed)
PA approved.   Request Reference Number: [id number]. TRAMADOL HCL TAB '50MG'$  is approved through 10/15/2022. Your patient may now fill this prescription and it will be covered.

## 2022-09-18 ENCOUNTER — Other Ambulatory Visit: Payer: Self-pay | Admitting: Medical

## 2022-09-20 NOTE — Anesthesia Preprocedure Evaluation (Signed)
Anesthesia Evaluation  Patient identified by MRN, date of birth, ID band Patient awake    Reviewed: Allergy & Precautions, NPO status , Patient's Chart, lab work & pertinent test results  History of Anesthesia Complications (+) PONV and history of anesthetic complications  Airway Mallampati: III  TM Distance: >3 FB Neck ROM: Full    Dental no notable dental hx. (+) Teeth Intact, Dental Advisory Given   Pulmonary asthma , pneumonia   Pulmonary exam normal breath sounds clear to auscultation       Cardiovascular hypertension, Pt. on medications + angina  + CAD  Normal cardiovascular exam Rhythm:Regular Rate:Normal  Stress MPS 11/20232   No change in EKGs during Lexiscan infusion.   Myoview scan shows normal perfusion.   LVEF calculated at 63% with normal wall motion   OVerall low risk study.   Echo 02/2021  1. Left ventricular ejection fraction, by estimation, is 70 to 75%. The left ventricle has hyperdynamic function. The left ventricle has no regional wall motion abnormalities. Left ventricular diastolic parameters are consistent with Grade I diastolic dysfunction (impaired relaxation).  2. Right ventricular systolic function is normal. The right ventricular size is normal. There is normal pulmonary artery systolic pressure.  3. The mitral valve is normal in structure. No evidence of mitral valve regurgitation. No evidence of mitral stenosis.  4. The aortic valve is tricuspid. There is mild calcification of the aortic valve. There is mild thickening of the aortic valve. Aortic valve regurgitation is not visualized. No aortic stenosis is present.  5. The inferior vena cava is normal in size with greater than 50% respiratory variability, suggesting right atrial pressure of 3 mmHg.     Neuro/Psych  PSYCHIATRIC DISORDERS Anxiety Depression    negative neurological ROS     GI/Hepatic Neg liver ROS,GERD  ,,  Endo/Other   diabetes    Renal/GU negative Renal ROS     Musculoskeletal  (+) Arthritis ,  Fibromyalgia -  Abdominal  (+) + obese  Peds  Hematology negative hematology ROS (+)   Anesthesia Other Findings   Reproductive/Obstetrics                             Anesthesia Physical Anesthesia Plan  ASA: 3  Anesthesia Plan: General   Post-op Pain Management: Tylenol PO (pre-op)*, Lidocaine infusion* and Ketamine IV*   Induction: Intravenous  PONV Risk Score and Plan: 4 or greater and Ondansetron, Dexamethasone, Treatment may vary due to age or medical condition and Midazolam  Airway Management Planned: Oral ETT  Additional Equipment:   Intra-op Plan:   Post-operative Plan: Extubation in OR  Informed Consent:   Plan Discussed with:   Anesthesia Plan Comments:        Anesthesia Quick Evaluation

## 2022-09-21 ENCOUNTER — Encounter (HOSPITAL_COMMUNITY)
Admission: RE | Disposition: A | Discharge status: Home / Self Care | Payer: Self-pay | Source: Home / Self Care | Attending: General Surgery

## 2022-09-21 ENCOUNTER — Inpatient Hospital Stay (HOSPITAL_COMMUNITY): Payer: Medicare Other | Admitting: Physician Assistant

## 2022-09-21 ENCOUNTER — Other Ambulatory Visit: Payer: Self-pay

## 2022-09-21 ENCOUNTER — Encounter (HOSPITAL_COMMUNITY): Payer: Self-pay | Admitting: General Surgery

## 2022-09-21 ENCOUNTER — Inpatient Hospital Stay (HOSPITAL_COMMUNITY): Payer: Medicare Other | Admitting: Certified Registered"

## 2022-09-21 ENCOUNTER — Inpatient Hospital Stay (HOSPITAL_COMMUNITY)
Admission: RE | Admit: 2022-09-21 | Discharge: 2022-09-22 | DRG: 621 | Disposition: A | Discharge status: Home / Self Care | Payer: Medicare Other | Attending: General Surgery | Admitting: General Surgery

## 2022-09-21 DIAGNOSIS — Z833 Family history of diabetes mellitus: Secondary | ICD-10-CM

## 2022-09-21 DIAGNOSIS — K660 Peritoneal adhesions (postprocedural) (postinfection): Secondary | ICD-10-CM | POA: Diagnosis present

## 2022-09-21 DIAGNOSIS — E6609 Other obesity due to excess calories: Secondary | ICD-10-CM

## 2022-09-21 DIAGNOSIS — I25119 Atherosclerotic heart disease of native coronary artery with unspecified angina pectoris: Secondary | ICD-10-CM

## 2022-09-21 DIAGNOSIS — Z6841 Body Mass Index (BMI) 40.0 and over, adult: Secondary | ICD-10-CM

## 2022-09-21 DIAGNOSIS — Z01818 Encounter for other preprocedural examination: Secondary | ICD-10-CM

## 2022-09-21 DIAGNOSIS — Z8249 Family history of ischemic heart disease and other diseases of the circulatory system: Secondary | ICD-10-CM | POA: Diagnosis not present

## 2022-09-21 DIAGNOSIS — E785 Hyperlipidemia, unspecified: Secondary | ICD-10-CM | POA: Diagnosis present

## 2022-09-21 DIAGNOSIS — K219 Gastro-esophageal reflux disease without esophagitis: Secondary | ICD-10-CM | POA: Diagnosis present

## 2022-09-21 DIAGNOSIS — Z79899 Other long term (current) drug therapy: Secondary | ICD-10-CM | POA: Diagnosis not present

## 2022-09-21 DIAGNOSIS — Z885 Allergy status to narcotic agent status: Secondary | ICD-10-CM | POA: Diagnosis not present

## 2022-09-21 DIAGNOSIS — I10 Essential (primary) hypertension: Secondary | ICD-10-CM | POA: Diagnosis present

## 2022-09-21 DIAGNOSIS — Z90710 Acquired absence of both cervix and uterus: Secondary | ICD-10-CM | POA: Diagnosis not present

## 2022-09-21 DIAGNOSIS — E559 Vitamin D deficiency, unspecified: Secondary | ICD-10-CM | POA: Diagnosis present

## 2022-09-21 DIAGNOSIS — E119 Type 2 diabetes mellitus without complications: Secondary | ICD-10-CM | POA: Diagnosis present

## 2022-09-21 DIAGNOSIS — Z794 Long term (current) use of insulin: Secondary | ICD-10-CM | POA: Diagnosis not present

## 2022-09-21 DIAGNOSIS — K760 Fatty (change of) liver, not elsewhere classified: Secondary | ICD-10-CM

## 2022-09-21 DIAGNOSIS — J45909 Unspecified asthma, uncomplicated: Secondary | ICD-10-CM | POA: Diagnosis present

## 2022-09-21 DIAGNOSIS — Z9884 Bariatric surgery status: Secondary | ICD-10-CM

## 2022-09-21 DIAGNOSIS — M159 Polyosteoarthritis, unspecified: Secondary | ICD-10-CM | POA: Diagnosis present

## 2022-09-21 DIAGNOSIS — Z888 Allergy status to other drugs, medicaments and biological substances status: Secondary | ICD-10-CM

## 2022-09-21 DIAGNOSIS — Z823 Family history of stroke: Secondary | ICD-10-CM

## 2022-09-21 HISTORY — PX: GASTRIC ROUX-EN-Y: SHX5262

## 2022-09-21 HISTORY — DX: Bariatric surgery status: Z98.84

## 2022-09-21 LAB — CBC
HCT: 46.5 % — ABNORMAL HIGH (ref 36.0–46.0)
Hemoglobin: 15.2 g/dL — ABNORMAL HIGH (ref 12.0–15.0)
MCH: 29.9 pg (ref 26.0–34.0)
MCHC: 32.7 g/dL (ref 30.0–36.0)
MCV: 91.5 fL (ref 80.0–100.0)
Platelets: 263 10*3/uL (ref 150–400)
RBC: 5.08 MIL/uL (ref 3.87–5.11)
RDW: 13.2 % (ref 11.5–15.5)
WBC: 18.7 10*3/uL — ABNORMAL HIGH (ref 4.0–10.5)
nRBC: 0 % (ref 0.0–0.2)

## 2022-09-21 LAB — GLUCOSE, CAPILLARY
Glucose-Capillary: 166 mg/dL — ABNORMAL HIGH (ref 70–99)
Glucose-Capillary: 233 mg/dL — ABNORMAL HIGH (ref 70–99)
Glucose-Capillary: 251 mg/dL — ABNORMAL HIGH (ref 70–99)
Glucose-Capillary: 249 mg/dL — ABNORMAL HIGH (ref 70–99)
Glucose-Capillary: 259 mg/dL — ABNORMAL HIGH (ref 70–99)
Glucose-Capillary: 241 mg/dL — ABNORMAL HIGH (ref 70–99)

## 2022-09-21 LAB — ABO/RH: ABO/RH(D): A POS

## 2022-09-21 LAB — CREATININE, SERUM
Creatinine, Ser: 0.55 mg/dL (ref 0.44–1.00)
GFR, Estimated: >60 mL/min (ref >60)

## 2022-09-21 SURGERY — LAPAROSCOPIC ROUX-EN-Y GASTRIC BYPASS WITH UPPER ENDOSCOPY
Anesthesia: General | Site: Abdomen

## 2022-09-21 MED ORDER — PROPOFOL 10 MG/ML IV BOLUS
INTRAVENOUS | Status: AC
  Filled 2022-09-21: qty 20

## 2022-09-21 MED ORDER — HEPARIN SODIUM (PORCINE) 5000 UNIT/ML IJ SOLN
5000.0000 [IU] | Freq: Three times a day (TID) | INTRAMUSCULAR | Status: DC
  Administered 2022-09-21 – 2022-09-22 (×2): 5000 [IU] via SUBCUTANEOUS
  Filled 2022-09-21 (×2): qty 1

## 2022-09-21 MED ORDER — FIBRIN SEALANT 2 ML SINGLE DOSE KIT
2.0000 mL | PACK | Freq: Once | CUTANEOUS | Status: AC
  Administered 2022-09-21: 2 mL via TOPICAL
  Filled 2022-09-21 (×2): qty 2

## 2022-09-21 MED ORDER — MOMETASONE FURO-FORMOTEROL FUM 200-5 MCG/ACT IN AERO
2.0000 | INHALATION_SPRAY | Freq: Two times a day (BID) | RESPIRATORY_TRACT | Status: DC
  Administered 2022-09-21 – 2022-09-22 (×2): 2 via RESPIRATORY_TRACT
  Filled 2022-09-21: qty 8.8

## 2022-09-21 MED ORDER — EPHEDRINE SULFATE-NACL 50-0.9 MG/10ML-% IV SOSY
PREFILLED_SYRINGE | INTRAVENOUS | Status: DC | PRN
  Administered 2022-09-21 (×3): 5 mg via INTRAVENOUS

## 2022-09-21 MED ORDER — SCOPOLAMINE 1 MG/3DAYS TD PT72
1.0000 | MEDICATED_PATCH | TRANSDERMAL | Status: DC
  Administered 2022-09-21: 1.5 mg via TRANSDERMAL
  Filled 2022-09-21: qty 1

## 2022-09-21 MED ORDER — SIMETHICONE 80 MG PO CHEW
80.0000 mg | CHEWABLE_TABLET | Freq: Four times a day (QID) | ORAL | Status: DC | PRN
  Administered 2022-09-22: 80 mg via ORAL
  Filled 2022-09-21: qty 1

## 2022-09-21 MED ORDER — SODIUM CHLORIDE (PF) 0.9 % IJ SOLN
INTRAMUSCULAR | Status: AC
  Filled 2022-09-21: qty 50

## 2022-09-21 MED ORDER — LOSARTAN POTASSIUM 50 MG PO TABS
50.0000 mg | ORAL_TABLET | Freq: Every day | ORAL | Status: DC
  Administered 2022-09-22: 50 mg via ORAL
  Filled 2022-09-21: qty 1

## 2022-09-21 MED ORDER — OXYCODONE HCL 5 MG/5ML PO SOLN
5.0000 mg | Freq: Four times a day (QID) | ORAL | Status: DC | PRN
  Administered 2022-09-21: 5 mg via ORAL
  Filled 2022-09-21: qty 5

## 2022-09-21 MED ORDER — ROCURONIUM BROMIDE 10 MG/ML (PF) SYRINGE
PREFILLED_SYRINGE | INTRAVENOUS | Status: DC | PRN
  Administered 2022-09-21: 20 mg via INTRAVENOUS
  Administered 2022-09-21: 10 mg via INTRAVENOUS
  Administered 2022-09-21: 20 mg via INTRAVENOUS
  Administered 2022-09-21: 70 mg via INTRAVENOUS
  Administered 2022-09-21: 10 mg via INTRAVENOUS

## 2022-09-21 MED ORDER — SODIUM CHLORIDE 0.9 % IV SOLN
2.0000 g | INTRAVENOUS | Status: AC
  Administered 2022-09-21: 2 g via INTRAVENOUS
  Filled 2022-09-21: qty 2

## 2022-09-21 MED ORDER — ENSURE MAX PROTEIN PO LIQD
2.0000 [oz_av] | ORAL | Status: DC
  Administered 2022-09-22 (×2): 2 [oz_av] via ORAL

## 2022-09-21 MED ORDER — ONDANSETRON HCL 4 MG/2ML IJ SOLN
INTRAMUSCULAR | Status: DC | PRN
  Administered 2022-09-21: 4 mg via INTRAVENOUS

## 2022-09-21 MED ORDER — LIDOCAINE 2% (20 MG/ML) 5 ML SYRINGE
INTRAMUSCULAR | Status: DC | PRN
  Administered 2022-09-21: 100 mg via INTRAVENOUS

## 2022-09-21 MED ORDER — FENTANYL CITRATE (PF) 100 MCG/2ML IJ SOLN
INTRAMUSCULAR | Status: DC | PRN
  Administered 2022-09-21: 50 ug via INTRAVENOUS
  Administered 2022-09-21: 100 ug via INTRAVENOUS
  Administered 2022-09-21: 50 ug via INTRAVENOUS

## 2022-09-21 MED ORDER — "VISTASEAL 4 ML SINGLE DOSE KIT "
4.0000 mL | PACK | Freq: Once | CUTANEOUS | Status: AC
  Administered 2022-09-21: 4 mL via TOPICAL
  Filled 2022-09-21: qty 4

## 2022-09-21 MED ORDER — FENTANYL CITRATE (PF) 100 MCG/2ML IJ SOLN
INTRAMUSCULAR | Status: AC
  Filled 2022-09-21: qty 2

## 2022-09-21 MED ORDER — MEPERIDINE HCL 50 MG/ML IJ SOLN: 6.2500 mg | INTRAMUSCULAR | Status: DC | PRN

## 2022-09-21 MED ORDER — 0.9 % SODIUM CHLORIDE (POUR BTL) OPTIME
TOPICAL | Status: DC | PRN
  Administered 2022-09-21: 1000 mL

## 2022-09-21 MED ORDER — CHLORHEXIDINE GLUCONATE 4 % EX LIQD: Freq: Once | CUTANEOUS | Status: DC

## 2022-09-21 MED ORDER — LACTATED RINGERS IR SOLN
Status: DC | PRN
  Administered 2022-09-21: 1000 mL

## 2022-09-21 MED ORDER — BUPIVACAINE LIPOSOME 1.3 % IJ SUSP
INTRAMUSCULAR | Status: DC | PRN
  Administered 2022-09-21: 20 mL

## 2022-09-21 MED ORDER — DEXAMETHASONE SODIUM PHOSPHATE 10 MG/ML IJ SOLN
INTRAMUSCULAR | Status: AC
  Filled 2022-09-21: qty 1

## 2022-09-21 MED ORDER — BUPIVACAINE LIPOSOME 1.3 % IJ SUSP: 20.0000 mL | Freq: Once | INTRAMUSCULAR | Status: DC

## 2022-09-21 MED ORDER — MIDAZOLAM HCL 2 MG/2ML IJ SOLN
INTRAMUSCULAR | Status: DC | PRN
  Administered 2022-09-21: 2 mg via INTRAVENOUS

## 2022-09-21 MED ORDER — TRAMADOL HCL 50 MG PO TABS
50.0000 mg | ORAL_TABLET | Freq: Four times a day (QID) | ORAL | Status: DC | PRN
  Administered 2022-09-21 – 2022-09-22 (×2): 50 mg via ORAL
  Filled 2022-09-21 (×3): qty 1

## 2022-09-21 MED ORDER — ROCURONIUM BROMIDE 10 MG/ML (PF) SYRINGE
PREFILLED_SYRINGE | INTRAVENOUS | Status: AC
  Filled 2022-09-21: qty 10

## 2022-09-21 MED ORDER — DEXAMETHASONE SODIUM PHOSPHATE 10 MG/ML IJ SOLN
INTRAMUSCULAR | Status: DC | PRN
  Administered 2022-09-21: 4 mg via INTRAVENOUS

## 2022-09-21 MED ORDER — LACTATED RINGERS IV SOLN: INTRAVENOUS | Status: DC

## 2022-09-21 MED ORDER — INSULIN ASPART 100 UNIT/ML IJ SOLN
11.0000 [IU] | Freq: Once | INTRAMUSCULAR | Status: AC
  Administered 2022-09-21: 11 [IU] via SUBCUTANEOUS

## 2022-09-21 MED ORDER — APREPITANT 40 MG PO CAPS
40.0000 mg | ORAL_CAPSULE | ORAL | Status: AC
  Administered 2022-09-21: 40 mg via ORAL
  Filled 2022-09-21: qty 1

## 2022-09-21 MED ORDER — ACETAMINOPHEN 500 MG PO TABS
1000.0000 mg | ORAL_TABLET | Freq: Three times a day (TID) | ORAL | Status: DC
  Administered 2022-09-21 – 2022-09-22 (×3): 1000 mg via ORAL
  Filled 2022-09-21 (×3): qty 2

## 2022-09-21 MED ORDER — ACETAMINOPHEN 160 MG/5ML PO SOLN: 1000.0000 mg | Freq: Three times a day (TID) | ORAL | Status: DC

## 2022-09-21 MED ORDER — KETAMINE HCL 10 MG/ML IJ SOLN
INTRAMUSCULAR | Status: DC | PRN
  Administered 2022-09-21: 30 mg via INTRAVENOUS

## 2022-09-21 MED ORDER — PANTOPRAZOLE SODIUM 40 MG IV SOLR
40.0000 mg | Freq: Every day | INTRAVENOUS | Status: DC
  Administered 2022-09-21: 40 mg via INTRAVENOUS
  Filled 2022-09-21: qty 10

## 2022-09-21 MED ORDER — AMISULPRIDE (ANTIEMETIC) 5 MG/2ML IV SOLN: 10.0000 mg | Freq: Once | INTRAVENOUS | Status: DC | PRN

## 2022-09-21 MED ORDER — INSULIN ASPART 100 UNIT/ML IJ SOLN
INTRAMUSCULAR | Status: AC
  Filled 2022-09-21: qty 1

## 2022-09-21 MED ORDER — ACETAMINOPHEN 500 MG PO TABS
1000.0000 mg | ORAL_TABLET | ORAL | Status: AC
  Administered 2022-09-21: 1000 mg via ORAL
  Filled 2022-09-21: qty 2

## 2022-09-21 MED ORDER — STERILE WATER FOR IRRIGATION IR SOLN
Status: DC | PRN
  Administered 2022-09-21: 1000 mL

## 2022-09-21 MED ORDER — PHENYLEPHRINE 80 MCG/ML (10ML) SYRINGE FOR IV PUSH (FOR BLOOD PRESSURE SUPPORT)
PREFILLED_SYRINGE | INTRAVENOUS | Status: DC | PRN
  Administered 2022-09-21: 80 ug via INTRAVENOUS

## 2022-09-21 MED ORDER — HEPARIN SODIUM (PORCINE) 5000 UNIT/ML IJ SOLN
5000.0000 [IU] | INTRAMUSCULAR | Status: AC
  Administered 2022-09-21: 5000 [IU] via SUBCUTANEOUS
  Filled 2022-09-21: qty 1

## 2022-09-21 MED ORDER — KETAMINE HCL 50 MG/5ML IJ SOSY
PREFILLED_SYRINGE | INTRAMUSCULAR | Status: AC
  Filled 2022-09-21: qty 5

## 2022-09-21 MED ORDER — SUGAMMADEX SODIUM 200 MG/2ML IV SOLN
INTRAVENOUS | Status: DC | PRN
  Administered 2022-09-21: 200 mg via INTRAVENOUS

## 2022-09-21 MED ORDER — HYDROMORPHONE HCL 1 MG/ML IJ SOLN
INTRAMUSCULAR | Status: AC
  Filled 2022-09-21: qty 1

## 2022-09-21 MED ORDER — CHLORHEXIDINE GLUCONATE 0.12 % MT SOLN
15.0000 mL | Freq: Once | OROMUCOSAL | Status: AC
  Administered 2022-09-21: 15 mL via OROMUCOSAL

## 2022-09-21 MED ORDER — MORPHINE SULFATE (PF) 2 MG/ML IV SOLN
1.0000 mg | INTRAVENOUS | Status: DC | PRN
  Administered 2022-09-21: 2 mg via INTRAVENOUS
  Filled 2022-09-21: qty 1

## 2022-09-21 MED ORDER — BUPIVACAINE LIPOSOME 1.3 % IJ SUSP
INTRAMUSCULAR | Status: AC
  Filled 2022-09-21: qty 20

## 2022-09-21 MED ORDER — FLUOXETINE HCL 20 MG PO CAPS
40.0000 mg | ORAL_CAPSULE | Freq: Every day | ORAL | Status: DC
  Administered 2022-09-22: 40 mg via ORAL
  Filled 2022-09-21: qty 2

## 2022-09-21 MED ORDER — MIDAZOLAM HCL 2 MG/2ML IJ SOLN
INTRAMUSCULAR | Status: AC
  Filled 2022-09-21: qty 2

## 2022-09-21 MED ORDER — HYDROMORPHONE HCL 1 MG/ML IJ SOLN
0.2500 mg | INTRAMUSCULAR | Status: DC | PRN
  Administered 2022-09-21 (×2): 0.5 mg via INTRAVENOUS

## 2022-09-21 MED ORDER — INSULIN ASPART 100 UNIT/ML IJ SOLN
0.0000 [IU] | INTRAMUSCULAR | Status: DC
  Administered 2022-09-21 (×2): 7 [IU] via SUBCUTANEOUS
  Administered 2022-09-21: 11 [IU] via SUBCUTANEOUS
  Administered 2022-09-22 (×2): 4 [IU] via SUBCUTANEOUS
  Administered 2022-09-22: 3 [IU] via SUBCUTANEOUS
  Administered 2022-09-22: 4 [IU] via SUBCUTANEOUS

## 2022-09-21 MED ORDER — PROPOFOL 10 MG/ML IV BOLUS
INTRAVENOUS | Status: DC | PRN
  Administered 2022-09-21: 200 mg via INTRAVENOUS

## 2022-09-21 MED ORDER — DEXAMETHASONE SODIUM PHOSPHATE 4 MG/ML IJ SOLN: 4.0000 mg | INTRAMUSCULAR | Status: DC

## 2022-09-21 MED ORDER — ONDANSETRON HCL 4 MG/2ML IJ SOLN: 4.0000 mg | Freq: Four times a day (QID) | INTRAMUSCULAR | Status: DC | PRN

## 2022-09-21 MED ORDER — BUPIVACAINE-EPINEPHRINE 0.25% -1:200000 IJ SOLN
INTRAMUSCULAR | Status: DC | PRN
  Administered 2022-09-21: 30 mL

## 2022-09-21 MED ORDER — ONDANSETRON HCL 4 MG/2ML IJ SOLN
INTRAMUSCULAR | Status: AC
  Filled 2022-09-21: qty 2

## 2022-09-21 MED ORDER — POTASSIUM CHLORIDE IN NACL 20-0.45 MEQ/L-% IV SOLN
INTRAVENOUS | Status: DC
  Filled 2022-09-21 (×4): qty 1000

## 2022-09-21 MED ORDER — ORAL CARE MOUTH RINSE: 15.0000 mL | Freq: Once | OROMUCOSAL | Status: AC

## 2022-09-21 SURGICAL SUPPLY — 86 items
ANTIFOG SOL W/FOAM PAD STRL (MISCELLANEOUS) ×1
APL LAPSCP 35 DL APL RGD (MISCELLANEOUS) ×2
APL PRP STRL LF DISP 70% ISPRP (MISCELLANEOUS) ×2
APL SWBSTK 6 STRL LF DISP (MISCELLANEOUS) ×1
APPLICATOR COTTON TIP 6 STRL (MISCELLANEOUS) IMPLANT
APPLICATOR COTTON TIP 6IN STRL (MISCELLANEOUS) ×1
APPLICATOR VISTASEAL 35 (MISCELLANEOUS) ×4 IMPLANT
APPLIER CLIP ROT 13.4 12 LRG (CLIP)
APR CLP LRG 13.4X12 ROT 20 MLT (CLIP)
BAG COUNTER SPONGE SURGICOUNT (BAG) IMPLANT
BAG SPNG CNTER NS LX DISP (BAG)
BLADE SURG SZ11 CARB STEEL (BLADE) ×2 IMPLANT
CABLE HIGH FREQUENCY MONO STRZ (ELECTRODE) IMPLANT
CHLORAPREP W/TINT 26 (MISCELLANEOUS) ×4 IMPLANT
CLIP APPLIE ROT 13.4 12 LRG (CLIP) IMPLANT
CLIP SUT LAPRA TY ABSORB (SUTURE) ×4 IMPLANT
CUTTER FLEX LINEAR 45M (STAPLE) IMPLANT
DEVICE SUT QUICK LOAD TK 5 (SUTURE) IMPLANT
DEVICE SUT TI-KNOT TK 5X26 (SUTURE) IMPLANT
DEVICE SUTURE ENDOST 10MM (ENDOMECHANICALS) ×2 IMPLANT
DRAIN PENROSE 0.25X18 (DRAIN) ×2 IMPLANT
DRSG TEGADERM 2-3/8X2-3/4 SM (GAUZE/BANDAGES/DRESSINGS) ×12 IMPLANT
ELECT L-HOOK LAP 45CM DISP (ELECTROSURGICAL) ×1
ELECT REM PT RETURN 15FT ADLT (MISCELLANEOUS) ×2 IMPLANT
ELECTRODE L-HOOK LAP 45CM DISP (ELECTROSURGICAL) IMPLANT
GAUZE 4X4 16PLY ~~LOC~~+RFID DBL (SPONGE) ×2 IMPLANT
GAUZE SPONGE 2X2 STRL 8-PLY (GAUZE/BANDAGES/DRESSINGS) ×2 IMPLANT
GAUZE SPONGE 4X4 12PLY STRL (GAUZE/BANDAGES/DRESSINGS) IMPLANT
GLOVE BIO SURGEON STRL SZ7.5 (GLOVE) ×2 IMPLANT
GLOVE INDICATOR 8.0 STRL GRN (GLOVE) ×2 IMPLANT
GOWN STRL REUS W/ TWL XL LVL3 (GOWN DISPOSABLE) ×8 IMPLANT
GOWN STRL REUS W/TWL XL LVL3 (GOWN DISPOSABLE) ×4
IRRIG SUCT STRYKERFLOW 2 WTIP (MISCELLANEOUS) ×1
IRRIGATION SUCT STRKRFLW 2 WTP (MISCELLANEOUS) ×2 IMPLANT
KIT BASIN OR (CUSTOM PROCEDURE TRAY) ×2 IMPLANT
KIT GASTRIC LAVAGE 34FR ADT (SET/KITS/TRAYS/PACK) ×2 IMPLANT
KIT TURNOVER KIT A (KITS) IMPLANT
MARKER SKIN DUAL TIP RULER LAB (MISCELLANEOUS) ×2 IMPLANT
MAT PREVALON FULL STRYKER (MISCELLANEOUS) ×2 IMPLANT
NDL SPNL 22GX3.5 QUINCKE BK (NEEDLE) ×2 IMPLANT
NEEDLE SPNL 22GX3.5 QUINCKE BK (NEEDLE) ×1 IMPLANT
PACK CARDIOVASCULAR III (CUSTOM PROCEDURE TRAY) ×2 IMPLANT
PENCIL SMOKE EVAC W/HOLSTER (ELECTROSURGICAL) IMPLANT
RELOAD 45 VASCULAR/THIN (ENDOMECHANICALS) IMPLANT
RELOAD ENDO STITCH 2.0 (ENDOMECHANICALS) ×11
RELOAD STAPLE 45 2.5 WHT GRN (ENDOMECHANICALS) IMPLANT
RELOAD STAPLE 45 3.5 BLU ETS (ENDOMECHANICALS) IMPLANT
RELOAD STAPLE 60 2.6 WHT THN (STAPLE) ×4 IMPLANT
RELOAD STAPLE 60 3.6 BLU REG (STAPLE) ×4 IMPLANT
RELOAD STAPLE 60 3.8 GOLD REG (STAPLE) ×2 IMPLANT
RELOAD STAPLE TA45 3.5 REG BLU (ENDOMECHANICALS) IMPLANT
RELOAD STAPLER BLUE 60MM (STAPLE) ×5 IMPLANT
RELOAD STAPLER GOLD 60MM (STAPLE) ×1 IMPLANT
RELOAD STAPLER WHITE 60MM (STAPLE) ×2 IMPLANT
RELOAD SUT SNGL STCH ABSRB 2-0 (ENDOMECHANICALS) ×10 IMPLANT
RELOAD SUT SNGL STCH BLK 2-0 (ENDOMECHANICALS) ×8 IMPLANT
SCISSORS LAP 5X45 EPIX DISP (ENDOMECHANICALS) ×2 IMPLANT
SET TUBE SMOKE EVAC HIGH FLOW (TUBING) ×2 IMPLANT
SHEARS HARMONIC ACE PLUS 45CM (MISCELLANEOUS) ×2 IMPLANT
SLEEVE ADV FIXATION 12X100MM (TROCAR) ×4 IMPLANT
SLEEVE ADV FIXATION 5X100MM (TROCAR) IMPLANT
SOLUTION ANTFG W/FOAM PAD STRL (MISCELLANEOUS) ×2 IMPLANT
STAPLE LINE REINFORCEMENT LAP (STAPLE) IMPLANT
STAPLER ECHELON BIOABSB 60 FLE (MISCELLANEOUS) IMPLANT
STAPLER ECHELON LONG 60 440 (INSTRUMENTS) ×2 IMPLANT
STAPLER RELOAD BLUE 60MM (STAPLE) ×5
STAPLER RELOAD GOLD 60MM (STAPLE) ×1
STAPLER RELOAD WHITE 60MM (STAPLE) ×2
STRIP CLOSURE SKIN 1/2X4 (GAUZE/BANDAGES/DRESSINGS) ×2 IMPLANT
SURGILUBE 2OZ TUBE FLIPTOP (MISCELLANEOUS) ×2 IMPLANT
SUT MNCRL AB 4-0 PS2 18 (SUTURE) ×2 IMPLANT
SUT RELOAD ENDO STITCH 2 48X1 (ENDOMECHANICALS) ×6
SUT RELOAD ENDO STITCH 2.0 (ENDOMECHANICALS) ×5
SUT SURGIDAC NAB ES-9 0 48 120 (SUTURE) IMPLANT
SUT VIC AB 2-0 SH 27 (SUTURE) ×1
SUT VIC AB 2-0 SH 27X BRD (SUTURE) ×2 IMPLANT
SUTURE RELOAD END STTCH 2 48X1 (ENDOMECHANICALS) ×6 IMPLANT
SUTURE RELOAD ENDO STITCH 2.0 (ENDOMECHANICALS) ×5 IMPLANT
SYR 20ML LL LF (SYRINGE) ×4 IMPLANT
TOWEL OR 17X26 10 PK STRL BLUE (TOWEL DISPOSABLE) ×2 IMPLANT
TOWEL OR NON WOVEN STRL DISP B (DISPOSABLE) ×2 IMPLANT
TRAY FOLEY MTR SLVR 16FR STAT (SET/KITS/TRAYS/PACK) IMPLANT
TROCAR ADV FIXATION 12X100MM (TROCAR) ×2 IMPLANT
TROCAR ADV FIXATION 5X100MM (TROCAR) ×2 IMPLANT
TROCAR XCEL NON-BLD 5MMX100MML (ENDOMECHANICALS) ×2 IMPLANT
TUBING CONNECTING 10 (TUBING) ×4 IMPLANT

## 2022-09-21 NOTE — H&P (Signed)
PROVIDER: Talon Witting Leanne Chang, MD  MRN: [id number] DOB: 01-Oct-1965 DATE OF ENCOUNTER: 09/09/2022 Subjective  Chief Complaint: RETURN WEIGHT LOSS (Pre op visit - Request Sleeve )   History of Present Illness: Megan Murray is a 57 y.o. female who is seen today for [place]-term follow-up regarding her severe obesity and related comorbidities..  Her comorbidities include hypertension, dyslipidemia, osteoarthritis of multiple joints, diabetes mellitus type 2, hepatic steatosis  She states that she has been a diabetic for about 12 years. She was just put on insulin back in March. She states she was admitted back in March with pancreatitis and ketoacidosis. They think that may be it was a side effect from Ozempic. She had been previously on Victoza and Trulicity and Ozempic for diabetes. Since going on insulin she unfortunately has gained weight.  Has completed the bariatric surgery evaluation process. She denies any medication changes since I last saw her. She denies any trips to the emergency room or hospital. She denies any chest pain or chest pressure or shortness of breath, dyspnea exertion.  Review of Systems: A complete review of systems was obtained from the patient. I have reviewed this information and discussed as appropriate with the patient. See HPI as well for other ROS.  ROS  Medical History: Past Medical History: Diagnosis Date Arthritis Asthma, unspecified asthma severity, unspecified whether complicated, unspecified whether persistent Diabetes mellitus without complication (CMS-HCC) Hyperlipidemia Hypertension  Patient Active Problem List Diagnosis Dyslipidemia, goal to be determined Essential hypertension Osteoarthritis, multiple sites Type 2 diabetes mellitus without complication, without Fujii-term current use of insulin (CMS-HCC) Vitamin D deficiency  Past Surgical History: Procedure Laterality Date CESAREAN  SECTION CHOLECYSTECTOMY HYSTERECTOMY   Allergies Allergen Reactions Codeine Itching, Palpitations, Rash and Other (See Comments) All codeine  Natural or Synthetic Codeine Atorvastatin Nausea and Other (See Comments) Myalgias Cholestyramine Diarrhea and Nausea Fenofibrate Diarrhea and Nausea And Vomiting Rosuvastatin Calcium Nausea and Other (See Comments) Myalgias Simvastatin Nausea and Other (See Comments) Myalgias Dulaglutide Other (See Comments) Pancreatitis Evolocumab Other (See Comments) flu Meloxicam Rash  Current Outpatient Medications on File Prior to Visit Medication Sig Dispense Refill ALBUTEROL SULFATE ORAL Take by mouth calcium-vitamin D3-magnesium 200 mg calcium- 1.25 mcg Cap Take by mouth FLUoxetine (PROZAC) 40 MG capsule Take 40 mg by mouth once daily insulin degludec (TRESIBA FLEXTOUCH U-100 SUBQ) Inject subcutaneously insulin LISPRO (ADMELOG, HUMALOG) injection (concentration 100 units/mL) Inject subcutaneously 3 (three) times daily with meals losartan (COZAAR) 50 MG tablet Take 50 mg by mouth once daily pantoprazole (PROTONIX) 40 MG DR tablet Take 40 mg by mouth once daily traMADoL (ULTRAM) 50 mg tablet Take 50 mg by mouth every 6 (six) hours as needed for Pain zolpidem (AMBIEN) 10 mg tablet Take 10 mg by mouth at bedtime as needed for Sleep  No current facility-administered medications on file prior to visit.  Family History Problem Relation Age of Onset Coronary Artery Disease (Blocked arteries around heart) Mother Obesity Sister Stroke Brother Obesity Brother Coronary Artery Disease (Blocked arteries around heart) Brother Diabetes Brother   Social History  Tobacco Use Smoking Status Never Smokeless Tobacco Never   Social History  Socioeconomic History Marital status: Married Tobacco Use Smoking status: Never Smokeless tobacco: Never Vaping Use Vaping Use: Never used Substance and Sexual Activity Alcohol use: Not Currently Drug  use: Never  Objective:  Vitals: 09/09/22 1045 09/09/22 1047 BP: (!) 152/82 Pulse: 87 Temp: 37.1 C (98.7 F) SpO2: 97% Weight: 99.7 kg (219 lb 12.8 oz) Height: 156.2 cm (5' 1.5") PainSc: 0-No pain  Body mass index is 40.86 kg/m.  Gen: alert, NAD, non-toxic appearing; severe obesity Pupils: equal, no scleral icterus Pulm: Lungs clear to auscultation, symmetric chest rise CV: regular rate and rhythm Abd: soft, nontender, nondistended. No cellulitis. No incisional hernia Ext: no edema, Skin: no rash, no jaundice  Labs, Imaging and Diagnostic Testing:  cBC, comprehensive metabolic panel September 08, 2022-normal except blood glucose 160  Labs from June 23, 2022 Lipid panel showed cholesterol 208, triglycerides 237, HDL 41, comprehensive metabolic panel normal except for blood glucose 172; vitamin D 41  Globin A1c January 20, 2022 7.1  Upper GI 04/20/22 - normal. No HH Cxr 06/30/22 - normal Ct a/p 3/23 - fatty liver. 57m L kidney stone Assessment and Plan: Diagnoses and all orders for this visit:  Severe obesity (CMS-HCC)  Type 2 diabetes mellitus without complication, without [place]-term current use of insulin (CMS-HCC)  Dyslipidemia, goal to be determined  Essential hypertension  Primary osteoarthritis involving multiple joints    She has completed the bariatric surgery evaluation process. She has received nutritional and psychological evaluation and clearance. SHe read over the surgical consent form for gastric bypass. I offered to rediscussed the steps of procedure along with risk and benefits but she declined. She has attended her preoperative education class. We reviewed her preoperative workup and evaluation. I recommended that she still do the preoperative liver shrinking diet. We discussed the typical hospitalization. We discussed the typical recovery. We discussed the need for monitoring her blood sugars after surgery and that she may need to be in contact more  frequently with her endocrinologist as she recovers too monitor her insulin regimen and blood sugars.  This patient encounter took 22 minutes today to perform the following: take history, perform exam, review outside records, interpret imaging, counsel the patient on their diagnosis and document encounter, findings & plan in the EHR  No follow-ups on file.  ELeighton Ruff WRedmond PullingMD FACS General, Minimally Invasive, & Bariatric Surgery Electronically signed by WRudean Curt MD at 09/09/2022 11:26 AM EST

## 2022-09-21 NOTE — Progress Notes (Signed)
Discussed QI "Goals for Discharge" document with patient including ambulation in halls, Incentive Spirometry use every hour, and oral care.  Also discussed pain and nausea control.  Enabled or verified head of bed 30 degree alarm activated.  BSTOP education provided including BSTOP information guide, "Guide for Pain Management after your Bariatric Procedure".  Diet progression education provided including "Bariatric Surgery Post-Op Food Plan Phase 1: Liquids". Concerns addressed. Noticed pt right arm slightly swollen along with some red blotches -consistent with petechiae, pt did have a BP cuff on that arm and also had her cgm on that upper right arm. Pt stated that she did not have any redness or swelling in that arm prior to surgery. Communicated to surgeon the concern, will await further feedback. Will continue to partner with bedside RN and follow up with patient per protocol.    Thank you,  Calton Dach, RN, MSN Bariatric Nurse Coordinator [phone] (office)

## 2022-09-21 NOTE — Interval H&P Note (Signed)
History and Physical Interval Note:  09/21/2022 7:25 AM  Megan Murray  has presented today for surgery, with the diagnosis of MORBID OBESITY.  The various methods of treatment have been discussed with the patient and family. After consideration of risks, benefits and other options for treatment, the patient has consented to  Procedure(s): LAPAROSCOPIC ROUX-EN-Y GASTRIC BYPASS WITH UPPER ENDOSCOPY (N/A) as a surgical intervention.  The patient's history has been reviewed, patient examined, no change in status, stable for surgery.  I have reviewed the patient's chart and labs.  Questions were answered to the patient's satisfaction.     Greer Pickerel

## 2022-09-21 NOTE — Op Note (Signed)
Preoperative diagnosis: Roux-en-Y gastric bypass  Postoperative diagnosis: Same   Procedure: Upper endoscopy   Surgeon: Clovis Riley, M.D.  Anesthesia: Gen.   Description of procedure: The endoscope was placed in the mouth and oropharynx and under endoscopic vision it was advanced to the esophagogastric junction which was identified at 38cm from the teeth.  The pouch was tensely insufflated while the upper abdomen was flooded with irrigation to perform a leak test, which was negative. No bubbles were seen.  The staple line was hemostatic and the anastomosis is visibly patent. The pouch measures 6cm in length and there is no appreciable retained fundus. The lumen was decompressed and the scope was withdrawn without difficulty.    Clovis Riley, M.D. General, Bariatric, & Minimally Invasive Surgery [facility]

## 2022-09-21 NOTE — Progress Notes (Signed)
Called pharmacy to confirm pt can take oxycodone with a codeine allergy. Pharmacy tech asked pharmacist and confirmed pt is able to take oxycodone.

## 2022-09-21 NOTE — Transfer of Care (Signed)
Immediate Anesthesia Transfer of Care Note  Patient: Megan Murray  Procedure(s) Performed: LAPAROSCOPIC ROUX-EN-Y GASTRIC BYPASS WITH UPPER ENDOSCOPY, LYSIS OF ADHESIONS X 30 MIN (Abdomen)  Patient Location: PACU  Anesthesia Type:General  Level of Consciousness: awake, alert , and patient cooperative  Airway & Oxygen Therapy: Patient Spontanous Breathing and Patient connected to face mask oxygen  Post-op Assessment: Report given to RN and Post -op Vital signs reviewed and stable  Post vital signs: Reviewed and stable  Last Vitals:  Vitals Value Taken Time  BP 177/81 09/21/22 1031  Temp    Pulse 80 09/21/22 1033  Resp 19 09/21/22 1033  SpO2 100 % 09/21/22 1033  Vitals shown include unvalidated device data.  Last Pain:  Vitals:   09/21/22 0610  TempSrc: Oral         Complications: No notable events documented.

## 2022-09-21 NOTE — Op Note (Signed)
Megan Murray [id number] 30-Oct-1965. 09/21/2022  Preoperative diagnosis:  Severe obesity (bmi 41)  Type 2 diabetes mellitus without complication, without [place]-term current use of insulin (CMS-HCC)  Dyslipidemia, goal to be determined  Essential hypertension  Primary osteoarthritis involving multiple joints   Postoperative  diagnosis:  1. same  Surgical procedure: Laparoscopic Roux-en-Y gastric bypass (ante-colic, ante-gastric); upper endoscopy Laparoscopic lysis of adhesions x 30 min; laparoscopic bilateral tap blcok  Surgeon: Gayland Curry, M.D. FACS  Asst.: Romana Juniper MD FACS  Anesthesia: General plus exparel/marcaine mix  Complications: None   EBL: Minimal   Drains: None   Disposition: PACU in good condition   Indications for procedure: 57 y.o. yo female with morbid obesity who has been unsuccessful at sustained weight loss. The patient's comorbidities are listed above. We discussed the risk and benefits of surgery including but not limited to anesthesia risk, bleeding, infection, blood clot formation, anastomotic leak, anastomotic stricture, ulcer formation, death, respiratory complications, intestinal blockage, internal hernia, gallstone formation, vitamin and nutritional deficiencies, injury to surrounding structures, failure to lose weight and mood changes.   Description of procedure: Patient is brought to the operating room and general anesthesia induced. The patient had received preoperative broad-spectrum IV antibiotics and subcutaneous heparin. The abdomen was widely sterilely prepped with Chloraprep and draped. Patient timeout was performed and correct patient and procedure confirmed. Access was obtained with a 5 mm Optiview trocar in the left upper quadrant and pneumoperitoneum established without difficulty. Under direct vision 12 mm trocars were placed laterally in the right upper quadrant, right upper quadrant midclavicular line, and to the left and above the  umbilicus for the camera port. A 5 mm trocar was placed laterally in the left upper quadrant.  Exparel/marcaine mix was infiltrated in bilateral lateral abdominal walls as a TAP block for postoperative pain relief.  The patient had had a prior C-section and hysterectomy.  She had a fair amount of omentum adhered to her anterior abdominal wall starting at the level of the umbilicus extending down toward her pelvis.  Did up taking down this omental adhesions to the anterior wall using the laparoscopic LigaSure device.  There adhesions from the omentum to the sigmoid colon.  I ended up with a small thermal burn on the sigmoid colon.  It was not full-thickness.  This was not an unexpected event given the dense omental adhesions.  I ended up placing a 2-0 silk Endo Stitch in a Lembert fashion over this area just to make sure.  The omentum was brought into the upper abdomen and the transverse mesocolon elevated and the ligament of Treitz clearly identified. A 60 cm biliopancreatic limb was then carefully measured from the ligament of Treitz. The small intestine was divided at this point with a single firing of the white load linear stapler. A Penrose drain was sutured to the end of the Roux-en-Y limb for later identification. A 100 cm Roux-en-Y limb was then carefully measured. At this point a side-to-side anastomosis was created between the Roux limb and the end of the biliopancreatic limb. This was accomplished with a single firing of the 60 mm white load linear stapler. The common enterotomy was closed with a running 2-0 Vicryl begun at either end of the enterotomy and tied centrally. vistaseal tissue sealant was placed over the anastomosis. The mesenteric defect was then closed with running 2-0 silk. The omentum was then divided with the harmonic scalpel up towards the transverse colon to allow mobility of the Roux limb toward the  gastric pouch. The patient was then placed in steep reversed Trendelenburg. Through a 5  mm subxiphoid site the [facility] retractor was placed and the left lobe of the liver elevated with excellent exposure of the upper stomach and hiatus. The angle of Hiss was then mobilized with the harmonic scalpel. A 5-6 cm gastric pouch was then carefully measured along the lesser curve of the stomach. Dissection was carried along the lesser curve at this point with the Harmonic scalpel working carefully back toward the lesser sac at right angles to the lesser curve. The free lesser sac was then entered. After being sure all tubes were removed from the stomach an initial firing of the gold load 60 mm linear stapler was fired at right angles across the lesser curve for about 4 cm. The gastric pouch was further mobilized posteriorly and then the pouch was completed with 4 further firings of the 60 mm blue load linear stapler up through the previously dissected angle of His. It was ensured that the pouch was completely mobilized away from the gastric remnant. This created a nice tubular 5 cm gastric pouch. The Roux limb was then brought up in an antecolic fashion with the candycane facing to the patient's left without undue tension. The gastrojejunostomy was created with an initial posterior row of 2-0 Vicryl between the Roux limb and the staple line of the gastric pouch. Enterotomies were then made in the gastric pouch and the Roux limb with the harmonic scalpel and at approximately 2-2-1/2 cm anastomosis was created with a single firing of the 92m blue load linear stapler. The staple line was inspected and was intact without bleeding. The common enterotomy was then closed with running 2-0 Vicryl begun at either end and tied centrally. The Ewall tube was then easily passed through the anastomosis and an outer anterior layer of running 2-0 Vicryl was placed. The Ewald tube was removed. With the outlet of the gastrojejunostomy clamped and under saline irrigation the assistant performed upper endoscopy and with the  gastric pouch tensely distended with air-there was no evidence of leak on this test. The pouch was desufflated. The PTerance Hartdefect was closed with running 2-0 silk. The abdomen was inspected for any evidence of bleeding or bowel injury and everything looked fine.  We inspected the sigmoid colon and it looked normal.  The Nathanson retractor was removed under direct vision after coating the anastomosis with Vistaseal tissue sealant. All CO2 was evacuated and trochars removed. Skin incisions were closed with 4-0 monocryl in a subcuticular fashion followed by steri-strips and bandages. Sponge needle and instrument counts were correct. The patient was taken to the PACU in good condition.    ELeighton Ruff WRedmond Pulling MD, FACS General, Bariatric, & Minimally Invasive Surgery [facility], [place]

## 2022-09-21 NOTE — Progress Notes (Signed)
PHARMACY CONSULT FOR:  Risk Assessment for Post-Discharge VTE Following Bariatric Surgery  Post-Discharge VTE Risk Assessment: This patient's probability of 30-day post-discharge VTE is increased due to the factors marked:  Sleeve gastrectomy  x Liver disorder (transplant, cirrhosis, or nonalcoholic steatohepatitis)   Hx of VTE   Hemorrhage requiring transfusion   GI perforation, leak, or obstruction   ====================================================    Female    Age >/=60 years    BMI >/=50 kg/m2    CHF    Dyspnea at Rest    Paraplegia   x Non-gastric-band surgery    Operation Time >/=3 hr    Return to OR     Length of Stay >/= 3 d   Hypercoagulable condition   Significant venous stasis      Predicted probability of 30-day post-discharge VTE: 0.16%  Other patient-specific factors to consider: n/a   Recommendation for Discharge: No pharmacologic prophylaxis post-discharge     Megan Murray is a 57 y.o. 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   Case start: 0758 Case end: 1018    Allergies  Allergen Reactions   Codeine Palpitations    Natural or Synthetic Codeine   Atorvastatin Nausea Only and Other (See Comments)    Myalgias   Cholestyramine Diarrhea and Nausea Only   Crestor [Rosuvastatin Calcium] Nausea Only and Other (See Comments)    Myalgias   Fenofibrate Diarrhea and Nausea And Vomiting   Simvastatin Nausea Only and Other (See Comments)    Myalgias   Soy Allergy Diarrhea and Nausea And Vomiting   Statins Nausea Only and Other (See Comments)     Myalgias   Ozempic (0.25 Or 0.5 Mg-Dose) [Semaglutide(0.25 Or 0.'5mg'$ -Dos)]     Caused pancreatitis   Repatha [Evolocumab] Other (See Comments)    flu   Synjardy [Empagliflozin-Metformin Hcl] Other (See Comments)    Caused pancreatitis   Trulicity [Dulaglutide] Other (See Comments)    Pancreatitis    Meloxicam Rash    Patient Measurements: Height: '5\' 1"'$  (154.9  cm) Weight: 99.2 kg (218 lb 12.8 oz) IBW/kg (Calculated) : 47.8 Body mass index is 41.34 kg/m.  No results for input(s): "WBC", "HGB", "HCT", "PLT", "APTT", "CREATININE", "LABCREA", "CREAT24HRUR", "MG", "PHOS", "ALBUMIN", "PROT", "AST", "ALT", "ALKPHOS", "BILITOT", "BILIDIR", "IBILI" in the last 72 hours. Estimated Creatinine Clearance: 83.8 mL/min (by C-G formula based on SCr of 0.47 mg/dL).    Past Medical History:  Diagnosis Date   Anxiety    Asthma    Atypical chest pain 02/20/2021   B12 deficiency 01/23/2015   Chronic fatigue 04/26/2019   Coronary artery disease moderate based on coronary CT angio of circumflex 04/29/2021   Cough variant asthma 08/10/2019   after has chest cold   COVID-19 05/14/2021   Depression    Diabetes mellitus ([place]) 02/19/2021   Diabetes mellitus without complication ([place])    DKA (diabetic ketoacidosis) ([place]) 09/27/2021   Dyslipidemia, goal to be determined    High triglycerides and LDL   Dyspnea on exertion 02/20/2021   Essential hypertension 11/09/2016   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 Toy Care   Mixed hyperlipidemia 11/04/2016   Failed statins; hyperglycemia with repatha. Stopped 06/2018    Myofascial pain  11/12/2019   Obesity due to excess calories without serious comorbidity    Osteoarthritis, multiple sites 04/25/2018   Other fatigue 01/20/2022   Other hyperlipidemia 01/20/2022   Pancreatic disease    PCOS (polycystic ovarian syndrome)    Pneumonia    PONV (postoperative nausea and vomiting)    Primary osteoarthritis of left knee 11/12/2019   Primary osteoarthritis of right knee 11/12/2019   Prolonged QT interval 09/27/2021   Reactive airway disease 02/19/2021   Seasonal  allergic rhinitis due to pollen 05/19/2018   SOBOE (shortness of breath on exertion)    Statin intolerance 06/21/2018   Type 2 diabetes mellitus without complication, without Delatorre-term current use of insulin ([place]) 11/09/2016   Vitamin D deficiency      Medications Prior to Admission  Medication Sig Dispense Refill Last Dose   Bempedoic Acid-Ezetimibe (NEXLIZET) 180-10 MG TABS Take 1 tablet by mouth daily. 90 tablet 3 09/20/2022   cholecalciferol (VITAMIN D3) 25 MCG (1000 UNIT) tablet Take 1,000 Units by mouth daily.   09/20/2022   Continuous Blood Gluc Receiver (FREESTYLE LIBRE 2 READER) DEVI Use as directed 1 each 0 09/21/2022   Continuous Blood Gluc Sensor (FREESTYLE LIBRE 2 SENSOR) MISC USE AS DIRECTED TO TEST BLOOD SUGAR AS DIRECTED BY MD 2 each 5 09/21/2022   FLUoxetine (PROZAC) 40 MG capsule Take 1 capsule (40 mg total) by mouth daily. 90 capsule 3 09/20/2022   glucose blood test strip Use as instructed: One touch Ultra E11.9 100 each 12 09/21/2022   insulin lispro (HUMALOG KWIKPEN) 100 UNIT/ML KwikPen Inject 6 Units into the skin 3 (three) times daily. (Patient taking differently: Inject 5-14 Units into the skin 3 (three) times daily. Sliding scale) 15 mL 11 09/20/2022   losartan (COZAAR) 50 MG tablet TAKE 1 TABLET(50 MG) BY MOUTH DAILY 90 tablet 0 09/21/2022   traMADol (ULTRAM) 50 MG tablet 1 tab po bid prn pain 30 tablet 0 Past Week   TRESIBA FLEXTOUCH 200 UNIT/ML FlexTouch Pen Inject 65 Units into the skin 2 (two) times daily.   09/21/2022   zolpidem (AMBIEN) 10 MG tablet TAKE 1 TABLET BY MOUTH EVERY NIGHT AT BEDTIME AS NEEDED FOR INSOMNIA (Patient taking differently: Take 10 mg by mouth at bedtime.) 30 tablet 2 09/20/2022   fluticasone-salmeterol (ADVAIR) 250-50 MCG/ACT AEPB INHALE 1 PUFF INTO THE LUNGS IN THE MORNING AND AT BEDTIME (Patient taking differently: Inhale 1 puff into the lungs 2 (two) times daily as needed (asthma).) 180 each 0 More than a month       [name] 09/21/2022,12:03 PM

## 2022-09-21 NOTE — Anesthesia Postprocedure Evaluation (Signed)
Anesthesia Post Note  Patient: Megan Murray  Procedure(s) Performed: LAPAROSCOPIC ROUX-EN-Y GASTRIC BYPASS WITH UPPER ENDOSCOPY, LYSIS OF ADHESIONS X 30 MIN (Abdomen)     Patient location during evaluation: PACU Anesthesia Type: General Level of consciousness: sedated and patient cooperative Pain management: pain level controlled Vital Signs Assessment: post-procedure vital signs reviewed and stable Respiratory status: spontaneous breathing Cardiovascular status: stable Anesthetic complications: no   No notable events documented.  Last Vitals:  Vitals:   09/21/22 1350 09/21/22 1447  BP: 136/80 137/76  Pulse: 68 73  Resp: 18 18  Temp: (!) 36.4 C 36.6 C  SpO2: 94% 98%    Last Pain:  Vitals:   09/21/22 1447  TempSrc: Oral  PainSc:                  Nolon Nations

## 2022-09-21 NOTE — Anesthesia Procedure Notes (Signed)
Procedure Name: Intubation Date/Time: 09/21/2022 7:39 AM  Performed by: Eben Burow, CRNAPre-anesthesia Checklist: Patient identified, Emergency Drugs available, Suction available, Patient being monitored and Timeout performed Patient Re-evaluated:Patient Re-evaluated prior to induction Oxygen Delivery Method: Circle system utilized Preoxygenation: Pre-oxygenation with 100% oxygen Induction Type: IV induction Ventilation: Mask ventilation without difficulty Grade View: Grade I Tube type: Oral Tube size: 7.0 mm Number of attempts: 1 Airway Equipment and Method: Stylet Placement Confirmation: ETT inserted through vocal cords under direct vision, positive ETCO2 and breath sounds checked- equal and bilateral Secured at: 20 cm Tube secured with: Tape Dental Injury: Teeth and Oropharynx as per pre-operative assessment

## 2022-09-22 ENCOUNTER — Encounter (HOSPITAL_COMMUNITY): Payer: Self-pay | Admitting: General Surgery

## 2022-09-22 ENCOUNTER — Other Ambulatory Visit: Payer: Self-pay | Admitting: Medical

## 2022-09-22 LAB — CBC WITH DIFFERENTIAL/PLATELET
Abs Immature Granulocytes: 0.07 10*3/uL (ref 0.00–0.07)
Basophils Absolute: 0 10*3/uL (ref 0.0–0.1)
Basophils Relative: 0 %
Eosinophils Absolute: 0 10*3/uL (ref 0.0–0.5)
Eosinophils Relative: 0 %
HCT: 39.8 % (ref 36.0–46.0)
Hemoglobin: 13.1 g/dL (ref 12.0–15.0)
Immature Granulocytes: 1 %
Lymphocytes Relative: 18 %
Lymphs Abs: 2.2 10*3/uL (ref 0.7–4.0)
MCH: 29.5 pg (ref 26.0–34.0)
MCHC: 32.9 g/dL (ref 30.0–36.0)
MCV: 89.6 fL (ref 80.0–100.0)
Monocytes Absolute: 1 10*3/uL (ref 0.1–1.0)
Monocytes Relative: 8 %
Neutro Abs: 9.1 10*3/uL — ABNORMAL HIGH (ref 1.7–7.7)
Neutrophils Relative %: 73 %
Platelets: 256 10*3/uL (ref 150–400)
RBC: 4.44 MIL/uL (ref 3.87–5.11)
RDW: 13.3 % (ref 11.5–15.5)
WBC: 12.4 10*3/uL — ABNORMAL HIGH (ref 4.0–10.5)
nRBC: 0 % (ref 0.0–0.2)

## 2022-09-22 LAB — COMPREHENSIVE METABOLIC PANEL
ALT: 163 U/L — ABNORMAL HIGH (ref 0–44)
AST: 177 U/L — ABNORMAL HIGH (ref 15–41)
Albumin: 3.9 g/dL (ref 3.5–5.0)
Alkaline Phosphatase: 63 U/L (ref 38–126)
Anion gap: 9 (ref 5–15)
BUN: 7 mg/dL (ref 6–20)
CO2: 25 mmol/L (ref 22–32)
Calcium: 8.7 mg/dL — ABNORMAL LOW (ref 8.9–10.3)
Chloride: 103 mmol/L (ref 98–111)
Creatinine, Ser: 0.5 mg/dL (ref 0.44–1.00)
GFR, Estimated: >60 mL/min (ref >60)
Glucose, Bld: 141 mg/dL — ABNORMAL HIGH (ref 70–99)
Potassium: 3.9 mmol/L (ref 3.5–5.1)
Sodium: 137 mmol/L (ref 135–145)
Total Bilirubin: 0.6 mg/dL (ref 0.3–1.2)
Total Protein: 7 g/dL (ref 6.5–8.1)

## 2022-09-22 LAB — GLUCOSE, CAPILLARY
Glucose-Capillary: 134 mg/dL — ABNORMAL HIGH (ref 70–99)
Glucose-Capillary: 151 mg/dL — ABNORMAL HIGH (ref 70–99)
Glucose-Capillary: 156 mg/dL — ABNORMAL HIGH (ref 70–99)
Glucose-Capillary: 177 mg/dL — ABNORMAL HIGH (ref 70–99)

## 2022-09-22 MED ORDER — ONDANSETRON 4 MG PO TBDP: 4.0000 mg | ORAL_TABLET | Freq: Four times a day (QID) | ORAL | 0 refills | Status: DC | PRN

## 2022-09-22 MED ORDER — ACETAMINOPHEN 500 MG PO TABS: 1000.0000 mg | ORAL_TABLET | Freq: Three times a day (TID) | ORAL | 0 refills | Status: AC

## 2022-09-22 MED ORDER — PANTOPRAZOLE SODIUM 40 MG PO TBEC: 40.0000 mg | DELAYED_RELEASE_TABLET | Freq: Every day | ORAL | 0 refills | Status: DC

## 2022-09-22 NOTE — TOC CM/SW Note (Signed)
  Transition of Care [facility]) Screening Note   Patient Details  Name: Megan Murray Date of Birth: [birth year]   Transition of [facility]) CM/SW Contact:    Lennart Pall, LCSW Phone Number: 09/22/2022, 10:36 AM    Transition of Care Department [facility]) has reviewed patient and no TOC needs have been identified at this time. We will continue to monitor patient advancement through interdisciplinary progression rounds. If new patient transition needs arise, please place a TOC consult.

## 2022-09-22 NOTE — Progress Notes (Signed)
Reviewed written D/C instructions with patient and all questions answered. Pt verbalized understanding. Pt left with all belongings in stable condition. 

## 2022-09-22 NOTE — Discharge Instructions (Signed)

## 2022-09-22 NOTE — Progress Notes (Signed)
Patient alert and oriented, pain is controlled. Patient is tolerating fluids, advanced to protein shake today, patient is tolerating well. Reviewed Gastric sleeve discharge instructions with patient and patient is able to articulate understanding. Provided information on BELT program, Support Group and [facility] outpatient pharmacy. Communicated general update of patient status to surgeon. All questions answered. 24hr fluid recall is 480 mL per hydration protocol, bariatric nurse coordinator to make follow-up phone call within one week.  

## 2022-09-22 NOTE — Inpatient Diabetes Management (Signed)
Inpatient Diabetes Program Recommendations  AACE/ADA: New Consensus Statement on Inpatient Glycemic Control (2015)  Target Ranges:  Prepandial:   less than 140 mg/dL      Peak postprandial:   less than 180 mg/dL (1-2 hours)      Critically ill patients:  140 - 180 mg/dL    Latest Reference Range & Units 09/08/22 08:25  Hemoglobin A1C 4.8 - 5.6 % 6.8 (H)  (H): Data is abnormally high  Latest Reference Range & Units 09/21/22 06:04 09/21/22 10:32 09/21/22 11:21 09/21/22 12:39 09/21/22 16:16 09/21/22 19:37 09/22/22 00:09 09/22/22 03:27 09/22/22 07:20  Glucose-Capillary 70 - 99 mg/dL 166 (H)    4 mg Decadron '@0747'$  251 (H)  11 units Novolog  233 (H) 249 (H)  7 units Novolog  259 (H)  11 units Novolog  241 (H)  7 units Novolog  177 (H)  4 units Novolog  134 (H)  3 units Novolog  151 (H)  4 units Novolog   (H): Data is abnormally high     Admit for Gastric Bypass  History: DM2  Home DM Meds: Freestyle Libre 2 CGM        Humalog 5-14 units TID per SSI        Tresiba 65 units BID  Current Orders: Novolog Resistant Correction Scale/ SSI (0-20 units) Q4 hours    ENDO: Dr. Chalmers Cater    Note consult for assistance with discharge meds for diabetes  Pt currently only receiving Novolog SSI  Met w/ pt at bedside this AM.  Pt told me she had a conversation with her ENDO Dr. Chalmers Cater with [facility] and Dr. Chalmers Cater told her to NOT take any Tresiba when she goes home (yet) and to monitor her CBGs and call ENDO office 1st thing AM after d/c from hospital for further insulin recommendations.  Pt has Freestyle Libre 2 CGM and can scan frequently for CBGs.  Pt also has appt 03/18 for follow up with ENDO.  Asked pt to please check her CBGs Q4 hours (while awake at home) and to use her Humalog SSI Q4 hours if needed.  Recommend for discharge: 1. Have pt check CBGs Q4 hours (while awake at home) and record  2. Use her home Humalog SSI Q4 hours if needed  3. Do Not  take any Tyler Aas yet (pt to call ENDO 1st thing AM after discharge from hospital)    --Will follow patient during hospitalization--  Wyn Quaker RN, MSN, [place] Diabetes Coordinator Inpatient Glycemic Control Team Team Pager: [phone] (8a-5p)

## 2022-09-23 ENCOUNTER — Telehealth: Payer: Self-pay

## 2022-09-23 NOTE — Discharge Summary (Signed)
Physician Discharge Summary  Megan Murray [id number] DOB: 04-07-1966 DOA: 09/21/2022  PCP: Mackie Pai, PA-C  Admit date: 09/21/2022 Discharge date: 09/22/2022  Recommendations for Outpatient Follow-up:     Follow-up Information     Greer Pickerel, MD Follow up on 10/14/2022.   Specialty: General Surgery Why: Please arrive 15 minutes prior to your appointment at 9:30a Contact information: [place] [address] [phone]         Lucita Ferrara, [place] Follow up on 11/12/2022.   Specialty: General Surgery Why: Please arrive 15 minutes prior to your appointment at 9:15 with Glenna Durand on behalf of Dr. Cyndi Bender information: [address] [place] [address] [phone]                Discharge Diagnoses:  Principal Problem:   S/P gastric bypass Severe obesity (bmi 41)  Type 2 diabetes mellitus without complication, without Ebright-term current use of insulin (CMS-HCC)  Dyslipidemia, goal to be determined  Essential hypertension  Primary osteoarthritis involving multiple joints   Surgical Procedure: Laparoscopic Roux-en-Y gastric bypass with lap lysis of adhesions x 30 min, upper endoscopy  Discharge Condition: Good Disposition: Home  Diet recommendation: Postoperative gastric bypass diet  Filed Weights   09/21/22 0555 09/21/22 0610  Weight: 98 kg 99.2 kg     Hospital Course:  The patient was admitted for a planned laparoscopic Roux-en-Y gastric bypass. Please see operative note. She had extensive omental adhesions to her abdominal wall and lower pelvis.  Preoperatively the patient was given 5000 units of subcutaneous heparin for DVT prophylaxis. ERAS protocol was used. Postoperative prophylactic heparin dosing was started on the evening of postoperative day 0.  The patient was started on ice chips and water on the evening of POD 0 which they tolerated. On postoperative day 1 The patient's diet was advanced  to protein shakes which they also tolerated. On POD 1, The patient was ambulating without difficulty. Their vital signs are stable without fever or tachycardia. Their hemoglobin had remained stable. The pt had some very minor RUE swelling with some mild redness noticed late on POD 0 and 1. No pitting edema. Good pulse. An IV had been attempted in her hand in short stay prior to OR but was not placed. There was a small hematoma in dorsal of hand. Same arm as cont glucose monitor. Arm not warm/tender.  Decided to observe. Diabetes nurse consult for dc recs. The patient had received discharge instructions and counseling. They were deemed stable for discharge.  BP (!) 140/67 (BP Location: Right Arm)   Pulse 73   Temp 99.7 F (37.6 C) (Oral)   Resp 18   Ht '5\' 1"'$  (1.549 m)   Wt 99.2 kg   SpO2 92%   BMI 41.34 kg/m   Gen: alert, NAD, non-toxic appearing Pupils: equal, no scleral icterus Pulm: Lungs clear to auscultation, symmetric chest rise CV: regular rate and rhythm Abd: soft, min tender, nondistended. No cellulitis. No incisional hernia Ext: no edema, no calf tenderness; see above about RUE Skin: no rash, no jaundice  Discharge Instructions  Discharge Instructions     Ambulate hourly while awake   Complete by: As directed    Call MD for:  difficulty breathing, headache or visual disturbances   Complete by: As directed    Call MD for:  persistant dizziness or light-headedness   Complete by: As directed    Call MD for:  persistant nausea and vomiting  Complete by: As directed    Call MD for:  redness, tenderness, or signs of infection (pain, swelling, redness, odor or green/yellow discharge around incision site)   Complete by: As directed    Call MD for:  severe uncontrolled pain   Complete by: As directed    Call MD for:  temperature >101 F   Complete by: As directed    Diet bariatric full liquid   Complete by: As directed    Discharge instructions   Complete by: As directed     See bariatric discharge instructions   Incentive spirometry   Complete by: As directed    Perform hourly while awake      Allergies as of 09/22/2022       Reactions   Codeine Palpitations   Natural or Synthetic Codeine   Atorvastatin Nausea Only, Other (See Comments)   Myalgias   Cholestyramine Diarrhea, Nausea Only   Crestor [rosuvastatin Calcium] Nausea Only, Other (See Comments)   Myalgias   Fenofibrate Diarrhea, Nausea And Vomiting   Simvastatin Nausea Only, Other (See Comments)   Myalgias   Soy Allergy Diarrhea, Nausea And Vomiting   Statins Nausea Only, Other (See Comments)    Myalgias   Ozempic (0.25 Or 0.5 Mg-dose) [semaglutide(0.25 Or 0.'5mg'$ -dos)]    Caused pancreatitis   Repatha [evolocumab] Other (See Comments)   flu   Synjardy [empagliflozin-metformin Hcl] Other (See Comments)   Caused pancreatitis   Trulicity [dulaglutide] Other (See Comments)   Pancreatitis    Meloxicam Rash        Medication List     STOP taking these medications    Tresiba FlexTouch 200 UNIT/ML FlexTouch Pen Generic drug: insulin degludec       TAKE these medications    acetaminophen 500 MG tablet Commonly known as: TYLENOL Take 2 tablets (1,000 mg total) by mouth every 8 (eight) hours for 5 days.   cholecalciferol 25 MCG (1000 UNIT) tablet Commonly known as: VITAMIN D3 Take 1,000 Units by mouth daily.   FLUoxetine 40 MG capsule Commonly known as: PROZAC Take 1 capsule (40 mg total) by mouth daily.   fluticasone-salmeterol 250-50 MCG/ACT Aepb Commonly known as: ADVAIR INHALE 1 PUFF INTO THE LUNGS IN THE MORNING AND AT BEDTIME What changed:  when to take this reasons to take this   FreeStyle Libre 2 Reader Kerrin Mo Use as directed   [employer] 2 Sensor Misc USE AS DIRECTED TO TEST BLOOD SUGAR AS DIRECTED BY MD   glucose blood test strip Use as instructed: One touch Ultra E11.9   insulin lispro 100 UNIT/ML KwikPen Commonly known as: HumaLOG KwikPen Inject 6  Units into the skin 3 (three) times daily. What changed:  how much to take additional instructions   losartan 50 MG tablet Commonly known as: COZAAR TAKE 1 TABLET(50 MG) BY MOUTH DAILY Notes to patient: Monitor Blood Pressure Daily and keep a log for primary care physician.  Your physician may need to make changes to your medications with rapid weight loss.      Nexlizet 180-10 MG Tabs Generic drug: Bempedoic Acid-Ezetimibe Take 1 tablet by mouth daily.   ondansetron 4 MG disintegrating tablet Commonly known as: ZOFRAN-ODT Take 1 tablet (4 mg total) by mouth every 6 (six) hours as needed for nausea or vomiting.   pantoprazole 40 MG tablet Commonly known as: PROTONIX Take 1 tablet (40 mg total) by mouth daily.   traMADol 50 MG tablet Commonly known as: ULTRAM 1 tab po bid prn pain  zolpidem 10 MG tablet Commonly known as: AMBIEN TAKE 1 TABLET BY MOUTH EVERY NIGHT AT BEDTIME AS NEEDED FOR INSOMNIA What changed: See the new instructions.        Follow-up Information     Greer Pickerel, MD Follow up on 10/14/2022.   Specialty: General Surgery Why: Please arrive 15 minutes prior to your appointment at 9:30a Contact information: [place] [address] [phone]         Lucita Ferrara, [place] Follow up on 11/12/2022.   Specialty: General Surgery Why: Please arrive 15 minutes prior to your appointment at 9:15 with Glenna Durand on behalf of Dr. Cyndi Bender information: [address] [place] [address] [phone]                  The results of significant diagnostics from this hospitalization (including imaging, microbiology, ancillary and laboratory) are listed below for reference.    Significant Diagnostic Studies: No results found.  Labs: Basic Metabolic Panel: Recent Labs  Lab 09/21/22 1432 09/22/22 0446  NA  --  137  K  --  3.9  CL  --  103  CO2  --  25  GLUCOSE  --  141*  BUN  --  7  CREATININE  0.55 0.50  CALCIUM  --  8.7*   Liver Function Tests: Recent Labs  Lab 09/22/22 0446  AST 177*  ALT 163*  ALKPHOS 63  BILITOT 0.6  PROT 7.0  ALBUMIN 3.9    CBC: Recent Labs  Lab 09/21/22 1432 09/22/22 0446  WBC 18.7* 12.4*  NEUTROABS  --  9.1*  HGB 15.2* 13.1  HCT 46.5* 39.8  MCV 91.5 89.6  PLT 263 256    CBG: Recent Labs  Lab 09/21/22 1937 09/22/22 0009 09/22/22 0327 09/22/22 0720 09/22/22 1207  GLUCAP 241* 177* 134* 151* 156*    Principal Problem:   S/P gastric bypass   Time coordinating discharge: 25 min  Signed:  Gayland Curry, MD [facility], [place] [phone] 09/23/2022, 4:11 PM

## 2022-09-23 NOTE — Transitions of Care (Post Inpatient/ED Visit) (Signed)
   09/23/2022  Name: Megan Murray MRN: [id number] DOB: [birth year]  Today's TOC FU Call Status: Today's TOC FU Call Status:: Successful TOC FU Call Competed TOC FU Call Complete Date: 09/23/22  Transition Care Management Follow-up Telephone Call Date of Discharge: 09/22/22 Discharge Facility: [facility]) Type of Discharge: Inpatient Admission Primary Inpatient Discharge Diagnosis:: bariatric surgery How have you been since you were released from the hospital?: Better Any questions or concerns?: No  Items Reviewed: Did you receive and understand the discharge instructions provided?: Yes Medications obtained and verified?: Yes (Medications Reviewed) Any new allergies since your discharge?: No Dietary orders reviewed?: Yes Do you have support at home?: Yes People in Home: spouse  Home Care and Equipment/Supplies: [place] Ordered?: NA Any new equipment or medical supplies ordered?: NA  Functional Questionnaire: Do you need assistance with bathing/showering or dressing?: No Do you need assistance with meal preparation?: No Do you need assistance with eating?: No Do you have difficulty maintaining continence: No Do you need assistance with getting out of bed/getting out of a chair/moving?: No Do you have difficulty managing or taking your medications?: No  Folllow up appointments reviewed: PCP Follow-up appointment confirmed?: NA Specialist Hospital Follow-up appointment confirmed?: Yes Date of Specialist follow-up appointment?: 10/14/22 Follow-Up Specialty Provider:: Dr Redmond Pulling Do you need transportation to your follow-up appointment?: No Do you understand care options if your condition(s) worsen?: Yes-patient verbalized understanding    [place], [place] Nurse Health Advisor Direct Dial [phone]

## 2022-09-25 ENCOUNTER — Other Ambulatory Visit: Payer: Self-pay | Admitting: Family

## 2022-10-01 ENCOUNTER — Telehealth (HOSPITAL_COMMUNITY): Payer: Self-pay | Admitting: *Deleted

## 2022-10-01 NOTE — Telephone Encounter (Signed)
1. Tell me about your pain and pain management?     Pt denies any pain.    2. Let's talk about fluid intake. How much total fluid are you taking in?   Pt states that s/he is working to meet goal of 64 oz of fluid. Pt encouraged to continue to work towards meeting goal. Pt instructed to assess status and suggestions daily utilizing Hydration Action Plan on discharge folder and to call [facility] if in the "red zone".    3. How much protein have you taken in the last day?    Pt states that she is working to meet the goal of 60g of protein today, but only able to get 30g. She is using [employer]life protein. She stated she has been working with her endocrinologist and noticed that her blood glucose has been spiking higher than desired. She said the sucralose which is in a lot of protein shakes has contributed to a lot of the blood sugar increase. Pt plans to drink the reminder of goal throughout the day by incorporating the unflavored Iso Pure protein powder into her diet to meet criteria.    4. Have you had nausea? Tell me about when you have experienced nausea and what you did to help?   Pt denies nausea.   5. Has the frequency or color changed with your urine?   Pt states that s/he is urinating "fine" with no changes in frequency or urgency.   6. Tell me what your incisions look like?   "Incisions are healing nicely". Pt denies a fever, chills. Pt states incisions are not swollen, open, or draining. Pt encouraged to call [facility] if incisions change.     7. Have you been passing gas? BM?   Pt states that they are having BMs regularly. Pt first post-op BM was on Thursday 03.14.22   Pt instructed to take either Miralax or MoM as instructed per "Gastric Bypass/Sleeve Discharge Home Care Instructions". Pt to call surgeon's office if not able to have BM with medication.     8. If a problem or question were to arise who would you call? Do you know contact numbers for [facility], [facility], and [facility]?   Pt knows to call  [facility] for surgical, [facility] for nutrition, and [facility] for non-urgent questions or concerns. Pt denies dehydration symptoms. Pt can describe s/sx of dehydration.   9. How has the walking going?   Pt states s/he is walking around and able to be active without difficulty.   10. Are you still using your incentive spirometer? If so, how often?   Pt states that s/he has not done the I.S. in a while. Pt encouraged to use incentive spirometer, around 10x every hour while awake until s/he sees the surgeon.   11. How are your vitamins and calcium going? How are you taking them?     Pt states that s/he is taking his/her supplements and vitamins without difficulty.  Pt concerned about the sucralose in the multivitamin and would prefer to take the capsules. Encouraged pt to communicate that concern to the RD as well

## 2022-10-05 ENCOUNTER — Encounter: Payer: Self-pay | Admitting: Dietician

## 2022-10-05 ENCOUNTER — Encounter: Payer: Medicare Other | Attending: General Surgery | Admitting: Dietician

## 2022-10-05 VITALS — Ht 61.0 in | Wt 204.0 lb

## 2022-10-05 DIAGNOSIS — E559 Vitamin D deficiency, unspecified: Secondary | ICD-10-CM | POA: Insufficient documentation

## 2022-10-05 DIAGNOSIS — I10 Essential (primary) hypertension: Secondary | ICD-10-CM | POA: Insufficient documentation

## 2022-10-05 DIAGNOSIS — M159 Polyosteoarthritis, unspecified: Secondary | ICD-10-CM | POA: Insufficient documentation

## 2022-10-05 DIAGNOSIS — Z713 Dietary counseling and surveillance: Secondary | ICD-10-CM | POA: Insufficient documentation

## 2022-10-05 DIAGNOSIS — E785 Hyperlipidemia, unspecified: Secondary | ICD-10-CM | POA: Diagnosis not present

## 2022-10-05 DIAGNOSIS — E669 Obesity, unspecified: Secondary | ICD-10-CM

## 2022-10-05 DIAGNOSIS — Z6838 Body mass index (BMI) 38.0-38.9, adult: Secondary | ICD-10-CM | POA: Diagnosis not present

## 2022-10-05 DIAGNOSIS — E119 Type 2 diabetes mellitus without complications: Secondary | ICD-10-CM | POA: Diagnosis not present

## 2022-10-05 NOTE — Progress Notes (Signed)
2 Week Post-Operative Nutrition Class   Patient was seen on 03/26/204 for Post-Operative Nutrition education at the [facility].    Surgery date: 09/21/2022 Surgery type: RYGB  Anthropometrics  Start weight at [facility]: 213.0 lbs (date: 04/22/2022)  Height: 61 in Weight today: 204.0 lbs. BMI: 38.55 kg/m2     Clinical  Medical hx: arthritis, asthma, depression, DM, hyperlipidemia, HTN, nerve/muscle disease Medications: see list  Labs: vit D 16.78 Notable signs/symptoms: nothing noted Any previous deficiencies? No Bowel Habits: Every day to every other day no complaints   Body Composition Scale 10/05/2022  Current Body Weight 204.0  Total Body Fat % 43.9  Visceral Fat 16  Fat-Free Mass % 56.0   Total Body Water % 42.5  Muscle-Mass lbs 27.3  BMI 38.6  Body Fat Displacement          Torso  lbs 55.4         Left Leg  lbs 11.0         Right Leg  lbs 11.0         Left Arm  lbs 5.5         Right Arm  lbs 5.5      The following the learning objectives were met by the patient during this course: Identifies Soft Prepped Plan Advancement Guide  Identifies Soft, High Proteins (Phase 1), beginning 2 weeks post-operatively to 3 weeks post-operatively Identifies Additional Soft High Proteins, soft non-starchy vegetables, fruits and starches (Phase 2), beginning 3 weeks post-operatively to 3 months post-operatively Identifies appropriate sources of fluids, proteins, vegetables, fruits and starches Identifies appropriate fat sources and healthy verses unhealthy fat types   States protein, vegetable, fruit and starch recommendations and appropriate sources post-operatively Identifies the need for appropriate texture modifications, mastication, and bite sizes when consuming solids Identifies appropriate fat consumption and sources Identifies appropriate multivitamin and calcium sources post-operatively Describes the need for physical activity post-operatively and  will follow MD recommendations States when to call healthcare provider regarding medication questions or post-operative complications   Handouts given during class include: Soft Prepped Plan Advancement Guide   Follow-Up Plan: Patient will follow-up at [facility] in 10 weeks for 3 month post-op nutrition visit for diet advancement per MD.

## 2022-10-10 ENCOUNTER — Other Ambulatory Visit: Payer: Self-pay | Admitting: Medical

## 2022-10-10 DIAGNOSIS — E119 Type 2 diabetes mellitus without complications: Secondary | ICD-10-CM

## 2022-10-14 ENCOUNTER — Telehealth: Payer: Self-pay | Admitting: Dietician

## 2022-10-14 NOTE — Telephone Encounter (Signed)
RD called pt to verify fluid intake once starting soft, solid proteins 2 week post-bariatric surgery.   Daily Fluid intake: Daily Protein intake: Bowel Habits:   Concerns/issues:    Left Voice Message 

## 2022-10-17 ENCOUNTER — Other Ambulatory Visit: Payer: Self-pay | Admitting: Medical

## 2022-10-17 DIAGNOSIS — E119 Type 2 diabetes mellitus without complications: Secondary | ICD-10-CM

## 2022-10-25 ENCOUNTER — Encounter: Payer: Self-pay | Admitting: *Deleted

## 2022-11-10 ENCOUNTER — Other Ambulatory Visit: Payer: Self-pay | Admitting: Medical

## 2022-11-21 ENCOUNTER — Other Ambulatory Visit: Payer: Self-pay | Admitting: Medical

## 2022-11-21 DIAGNOSIS — F5101 Primary insomnia: Secondary | ICD-10-CM

## 2022-11-22 NOTE — Telephone Encounter (Signed)
RequestingRemus Loffler Contract: 06/23/22 UDS:06/23/22 Last Visit:09/10/22 Next Visit:n/a Last Refill:08/23/22  Please Advise

## 2022-11-23 NOTE — Telephone Encounter (Signed)
Rx refill ambien sent to pharmacy

## 2022-12-14 ENCOUNTER — Ambulatory Visit: Payer: Medicare Other | Admitting: Dietician

## 2022-12-20 ENCOUNTER — Other Ambulatory Visit: Payer: Self-pay | Admitting: Cardiology

## 2022-12-21 ENCOUNTER — Other Ambulatory Visit: Payer: Self-pay | Admitting: Medical

## 2022-12-21 DIAGNOSIS — F5101 Primary insomnia: Secondary | ICD-10-CM

## 2022-12-22 NOTE — Telephone Encounter (Signed)
Requesting: ambien Contract:06/23/22 UDS:06/23/22 Last Visit:06/23/22 Next Visit:n/a Last Refill:11/23/22  Please Advise

## 2022-12-23 ENCOUNTER — Other Ambulatory Visit: Payer: Self-pay | Admitting: Medical

## 2022-12-23 DIAGNOSIS — F5101 Primary insomnia: Secondary | ICD-10-CM

## 2022-12-23 NOTE — Telephone Encounter (Signed)
Requesting: ambien Contract:06/23/22 UDS:06/23/22 Last Visit:09/10/22 Next Visit:n/a Last Refill:11/23/22  Please Advise

## 2022-12-23 NOTE — Addendum Note (Signed)
Addended by: Maximino Sarin on: 12/23/2022 03:18 PM   Modules accepted: Orders

## 2022-12-23 NOTE — Telephone Encounter (Signed)
Rx refill sent.

## 2022-12-23 NOTE — Telephone Encounter (Signed)
Prescription Request  12/23/2022  Is this a "Controlled Substance" medicine? Yes  LOV: Visit date not found   What is the name of the medication or equipment?  zolpidem (AMBIEN) 10 MG tablet [[id number]]   Have you contacted your pharmacy to request a refill? Yes   Which pharmacy would you like this sent to?  [id number] [address] [place] [address] [phone]   Patient notified that their request is being sent to the clinical staff for review and that they should receive a response within 2 business days.   Please advise at Mobile [phone] (mobile)

## 2023-01-06 ENCOUNTER — Encounter: Payer: Medicare Other | Attending: General Surgery | Admitting: Dietician

## 2023-01-06 ENCOUNTER — Encounter: Payer: Self-pay | Admitting: Dietician

## 2023-01-06 VITALS — Ht 61.0 in | Wt 187.3 lb

## 2023-01-06 DIAGNOSIS — E119 Type 2 diabetes mellitus without complications: Secondary | ICD-10-CM | POA: Diagnosis present

## 2023-01-06 DIAGNOSIS — E669 Obesity, unspecified: Secondary | ICD-10-CM | POA: Diagnosis present

## 2023-01-06 NOTE — Progress Notes (Signed)
Bariatric Nutrition Follow-Up Visit Medical Nutrition Therapy  Appt Start Time: 10:30   End Time: 10:58  Surgery date: 09/21/2022 Surgery type: RYGB  NUTRITION ASSESSMENT  Anthropometrics  Start weight at [facility]: 213.0 lbs (date: 04/22/2022)  Height: 61 in Weight today: 187.3 lbs. BMI: 35.9 kg/m2     Clinical  Medical hx: arthritis, asthma, depression, DM, hyperlipidemia, HTN, nerve/muscle disease Medications: see list  Labs: vit D 16.78 Notable signs/symptoms: nothing noted Any previous deficiencies? No Bowel Habits: Every day with no complaints   Body Composition Scale 10/05/2022 01/05/2022  Current Body Weight 204.0 187.3  Total Body Fat % 43.9 41.6  Visceral Fat 16 14  Fat-Free Mass % 56.0 58.3   Total Body Water % 42.5 43.6  Muscle-Mass lbs 27.3 27.1  BMI 38.6 35.4  Body Fat Displacement           Torso  lbs 55.4 48.3         Left Leg  lbs 11.0 9.6         Right Leg  lbs 11.0 9.6         Left Arm  lbs 5.5 4.8         Right Arm  lbs 5.5 4.8     Lifestyle & Dietary Hx  Pt states she would not know she had the surgery, except she gets full faster.  Pt states she also noticed that she sleeps better now. Pt states her blood sugar average in the morning is 116 (fasting), stating she has been able to cut back on insulin.   Pt states she can not do pork, stating she can do steak. Pt states grits sit on her stomach like a ton of bricks. Pt states she is does not eat starchy foods, stating it will spike her blood sugars.  Pt states surgeon told her not eat those foods.  Estimated daily fluid intake: 64 oz Estimated daily protein intake: 60-90 g Supplements: multivitamin and calcium Current average weekly physical activity: walking about 3 miles 4-5 days a week and water aerobics (45 minute class) twice a week.   24-Hr Dietary Recall First Meal: 1 egg (duck egg is a little bigger than other eggs) or protein shake Snack:   Second Meal: [place] with cheese, vegetables  (tolerating raw vegetables well), left over ground beef Snack: yes Third Meal: dinner meat and vegetables Snack: sometimes a protein shake. Beverages: water  Post-Op Goals/ Signs/ Symptoms Using straws: yes Drinking while eating: no Chewing/swallowing difficulties: no Changes in vision: no Changes to mood/headaches: no Hair loss/changes to skin/nails: no Difficulty focusing/concentrating: no Sweating: no Limb weakness: no Dizziness/lightheadedness: no Palpitations: no  Carbonated/caffeinated beverages: no N/V/D/C/Gas: no Abdominal pain: no Dumping syndrome: no    NUTRITION DIAGNOSIS  Overweight/obesity (Randall-3.3) related to past poor dietary habits and physical inactivity as evidenced by completed bariatric surgery and following dietary guidelines for continued weight loss and healthy nutrition status.     NUTRITION INTERVENTION Nutrition counseling (C-1) and education (E-2) to facilitate bariatric surgery goals, including: Diet advancement to the standard prep plan. The importance of consuming adequate calories as well as certain nutrients daily due to the body's need for essential vitamins, minerals, and fats The importance of daily physical activity and to reach a goal of at least 150 minutes of moderate to vigorous physical activity weekly (or as directed by their physician) due to benefits such as increased musculature and improved lab values The importance of intuitive eating specifically learning hunger-satiety cues and understanding the  importance of learning a new body: The importance of mindful eating to avoid grazing behaviors   Handouts Provided Include  Standard  Learning Style & Readiness for Change Teaching method utilized: Visual & Auditory  Demonstrated degree of understanding via: Teach Back  Readiness Level: Action Barriers to learning/adherence to lifestyle change: nothing identified   RD's Notes for Next Visit Assess adherence to pt chosen  goals  MONITORING & EVALUATION Dietary intake, weekly physical activity, body weight  Next Steps Patient is to return to [facility] in 3 months for 6 month post-op follow-up.

## 2023-01-18 ENCOUNTER — Other Ambulatory Visit: Payer: Self-pay | Admitting: Medical

## 2023-01-18 DIAGNOSIS — F5101 Primary insomnia: Secondary | ICD-10-CM

## 2023-01-19 NOTE — Telephone Encounter (Signed)
[name] Contract: 07/08/22 UDS:07/08/22 Last Visit:09/10/22 Next Visit:n/a Last Refill:12/23/22    pease Advise

## 2023-01-20 NOTE — Telephone Encounter (Signed)
Ambien refilled but she needs controlled med visit within a month. So please go ahead and get her scheduled.

## 2023-01-21 ENCOUNTER — Telehealth: Payer: Self-pay | Admitting: Medical

## 2023-01-21 NOTE — Telephone Encounter (Signed)
Mychart message sent.

## 2023-01-21 NOTE — Telephone Encounter (Signed)
Spoke with Megan Murray made her aware , her medication has been sent in for the month of July , just needs an appointment for August , she wanted appointment cancelled for Tuesday , offered to schedule her an appointment for August and she stated she is changing providers, its always an issue with getting her medications or pcp is on vacation when needed or pcp is running behind, and she is seeing another DR in August and I told her I will remove Ramon Dredge as her PCP then and she stated okay and hung up.

## 2023-01-21 NOTE — Telephone Encounter (Signed)
Patient called upset as she is on vacation and will be out of meds on Sunday. An appt was made for Tuesday she would like to know if a bridge can be done until appt. Please reach out

## 2023-01-25 ENCOUNTER — Telehealth: Payer: Medicare Other | Admitting: Medical

## 2023-01-25 ENCOUNTER — Other Ambulatory Visit: Payer: Self-pay | Admitting: Medical

## 2023-02-14 ENCOUNTER — Encounter (HOSPITAL_BASED_OUTPATIENT_CLINIC_OR_DEPARTMENT_OTHER): Payer: Self-pay | Admitting: Family Medicine

## 2023-02-14 ENCOUNTER — Ambulatory Visit (INDEPENDENT_AMBULATORY_CARE_PROVIDER_SITE_OTHER): Payer: Medicare Other | Admitting: Family Medicine

## 2023-02-14 VITALS — BP 102/80 | Ht 61.0 in | Wt 186.0 lb

## 2023-02-14 DIAGNOSIS — Z7689 Persons encountering health services in other specified circumstances: Secondary | ICD-10-CM

## 2023-02-14 DIAGNOSIS — M542 Cervicalgia: Secondary | ICD-10-CM | POA: Insufficient documentation

## 2023-02-14 DIAGNOSIS — M797 Fibromyalgia: Secondary | ICD-10-CM

## 2023-02-14 DIAGNOSIS — E119 Type 2 diabetes mellitus without complications: Secondary | ICD-10-CM

## 2023-02-14 DIAGNOSIS — F5101 Primary insomnia: Secondary | ICD-10-CM

## 2023-02-14 DIAGNOSIS — Z794 Long term (current) use of insulin: Secondary | ICD-10-CM

## 2023-02-14 DIAGNOSIS — F339 Major depressive disorder, recurrent, unspecified: Secondary | ICD-10-CM

## 2023-02-14 DIAGNOSIS — I152 Hypertension secondary to endocrine disorders: Secondary | ICD-10-CM

## 2023-02-14 DIAGNOSIS — E1159 Type 2 diabetes mellitus with other circulatory complications: Secondary | ICD-10-CM | POA: Diagnosis not present

## 2023-02-14 DIAGNOSIS — E538 Deficiency of other specified B group vitamins: Secondary | ICD-10-CM

## 2023-02-14 DIAGNOSIS — Z9884 Bariatric surgery status: Secondary | ICD-10-CM

## 2023-02-14 HISTORY — DX: Primary insomnia: F51.01

## 2023-02-14 HISTORY — DX: Cervicalgia: M54.2

## 2023-02-14 MED ORDER — TRAMADOL HCL 50 MG PO TABS
ORAL_TABLET | ORAL | 0 refills | Status: DC
Start: 2023-02-14 — End: 2023-05-02

## 2023-02-14 MED ORDER — LOSARTAN POTASSIUM 25 MG PO TABS
25.0000 mg | ORAL_TABLET | Freq: Every day | ORAL | 2 refills | Status: DC
Start: 2023-02-14 — End: 2023-09-11
  Filled 2023-06-16: qty 90, 90d supply, fill #0

## 2023-02-14 MED ORDER — ZOLPIDEM TARTRATE 10 MG PO TABS
ORAL_TABLET | ORAL | 0 refills | Status: DC
Start: 2023-02-14 — End: 2023-03-24

## 2023-02-14 NOTE — Progress Notes (Signed)
New Patient Office Visit  Subjective    Patient ID: Megan Murray, female    DOB: 1965/07/15  Age: 57 y.o. MRN: [id number]  CC:  Chief Complaint  Patient presents with   Shoulder Pain    Ongoing since June, shoulder and neck. Sees chiropractor, may be pinched nerve not getting better    HPI Megan Murray is a 57 year-old female who presents to establish care. Significant past medical history includes fibromyalgia, depression, gastric bypass (09/2022), DM2, HTN, insomnia, and neck pain/pinched nerve. Today, she has concerns regarding her neck pain that is now radiating to her left arm/elbow. She has been seeing a [facility] since June and has noticed improvement in ROM, but still having pain. She notices relief with Tylenol but does not take this medication often to to history of liver disease. She does have extensive family history- diabetes, liver disease & heart disease   She has been on [employer]-term disability since 2004 for her chronic diseases- fibromyalgia & depression. She currently does exercise by walking daily and swimming at [employer]. She does take tramadol for fibromyalgia & myofascial pain syndrome- not taken daily. Diagnosed in 2000 with fibromyalgia, takes tramadol when she feels flare- takes it rarely, depends on activity level. If very active, she will have a flare.    History with DM: in 2022, started on Ozempic and did not lose weight. March 2023- developed pancreatitis & DKA, placed on insulin and gained 40lbs. [facility] to [facility] (Dr. Andrey Campanile) for gastric bypass surgery. In March 2024- gastric bypass surgery was done. She has been recovering well and has really had no issues since the surgery. Denies vomiting, nausea, heartburn and notices she has to eat less while avoiding certain foods. She is currently taking a bariatric vitamin & OTC calcium TID.   DM2- Followed by endocrinology- sliding scale- Humalog & Evaristo Bury  25unitsAM & 20units PM  HTN- losartan 50mg  daily, continues to lose weight and blood sugar is normalizing. Does not feel lightheaded or dizziness.   Neck pain- has been seeing chiropractor since June due to weakness and pain in neck/left arm. He did imaging and told her that she has a pinched nerve. The only thing she can think that caused this was from overusing her arms after her gastric bypass surgery. Pain is persisting. She is still having pain with raising her arm over her head. Denies numbness/tingling. Relief with Tylenol.   Insomnia- Ambien every night    Outpatient Encounter Medications as of 02/14/2023  Medication Sig   Bempedoic Acid-Ezetimibe (NEXLIZET) 180-10 MG TABS Take 1 tablet by mouth daily.   Continuous Blood Gluc Receiver (FREESTYLE LIBRE 2 READER) DEVI Use as directed   Continuous Glucose Sensor (FREESTYLE LIBRE 2 SENSOR) MISC USE SENSOR AS DIRECTED TO TEST BLOOD SUGAR   FLUoxetine (PROZAC) 40 MG capsule Take 1 capsule (40 mg total) by mouth daily.   fluticasone-salmeterol (WIXELA INHUB) 250-50 MCG/ACT AEPB Inhale 1 puff into the lungs 2 (two) times daily.   insulin lispro (HUMALOG KWIKPEN) 100 UNIT/ML KwikPen Inject 6 Units into the skin 3 (three) times daily. (Patient taking differently: Inject 5-14 Units into the skin 3 (three) times daily. Sliding scale)   losartan (COZAAR) 25 MG tablet Take 1 tablet (25 mg total) by mouth daily.   ONETOUCH ULTRA test strip USE TO CHECK THREE TIMES DAILY   [DISCONTINUED] losartan (COZAAR) 50 MG tablet TAKE 1 TABLET(50 MG) BY MOUTH DAILY   [DISCONTINUED] traMADol (  ULTRAM) 50 MG tablet 1 tab po bid prn pain   [DISCONTINUED] zolpidem (AMBIEN) 10 MG tablet TAKE 1 TABLET BY MOUTH EVERY NIGHT AT BEDTIME AS NEEDED FOR INSOMNIA   traMADol (ULTRAM) 50 MG tablet 1 tab po bid prn pain   zolpidem (AMBIEN) 10 MG tablet TAKE 1 TABLET BY MOUTH EVERY NIGHT AT BEDTIME AS NEEDED FOR INSOMNIA   [DISCONTINUED] cholecalciferol (VITAMIN D3) 25 MCG (1000  UNIT) tablet Take 1,000 Units by mouth daily. (Patient not taking: Reported on 02/14/2023)   [DISCONTINUED] levocetirizine (XYZAL) 5 MG tablet TAKE 1 TABLET(5 MG) BY MOUTH EVERY EVENING (Patient not taking: Reported on 02/14/2023)   [DISCONTINUED] ondansetron (ZOFRAN-ODT) 4 MG disintegrating tablet Take 1 tablet (4 mg total) by mouth every 6 (six) hours as needed for nausea or vomiting. (Patient not taking: Reported on 02/14/2023)   [DISCONTINUED] pantoprazole (PROTONIX) 40 MG tablet Take 1 tablet (40 mg total) by mouth daily. (Patient not taking: Reported on 02/14/2023)   No facility-administered encounter medications on file as of 02/14/2023.    Past Medical History:  Diagnosis Date   Anxiety    Asthma    Atypical chest pain 02/20/2021   B12 deficiency 01/23/2015   Chronic fatigue 04/26/2019   Coronary artery disease moderate based on coronary CT angio of circumflex 04/29/2021   Cough variant asthma 08/10/2019   after has chest cold   COVID-19 05/14/2021   Depression    Diabetes mellitus (HCC) 02/19/2021   Diabetes mellitus without complication (HCC)    DKA (diabetic ketoacidosis) (HCC) 09/27/2021   Dyslipidemia, goal to be determined    High triglycerides and LDL   Dyspnea on exertion 82/95/6213   Essential hypertension 11/09/2016   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 Evelene Croon   Mixed hyperlipidemia 11/04/2016   Failed statins; hyperglycemia with repatha. Stopped 06/2018    Myofascial pain 11/12/2019   Obesity due to excess calories without serious comorbidity    Osteoarthritis, multiple sites 04/25/2018   Other fatigue 01/20/2022   Other hyperlipidemia 01/20/2022   Pancreatic disease    PCOS (polycystic ovarian syndrome)     Pneumonia    PONV (postoperative nausea and vomiting)    Primary osteoarthritis of left knee 11/12/2019   Primary osteoarthritis of right knee 11/12/2019   Prolonged QT interval 09/27/2021   Reactive airway disease 02/19/2021   Seasonal allergic rhinitis due to pollen 05/19/2018   SOBOE (shortness of breath on exertion)    Statin intolerance 06/21/2018   Type 2 diabetes mellitus without complication, without Vokes-term current use of insulin (HCC) 11/09/2016   Vitamin D deficiency     Past Surgical History:  Procedure Laterality Date   ABDOMINAL HYSTERECTOMY     CESAREAN SECTION  1994   GALLBLADDER SURGERY  1995   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   Procedure: LAPAROSCOPIC ROUX-EN-Y GASTRIC BYPASS WITH UPPER ENDOSCOPY, LYSIS OF ADHESIONS X 30 MIN;  Surgeon: Gaynelle Adu, MD;  Location: [facility];  Service: General;  Laterality: N/A;   REDUCTION MAMMAPLASTY      Family History  Problem Relation Age of Onset   Heart disease Mother    Hypertension Mother    COPD Mother        Died at 67  Heart failure Mother        Not sure of details   Depression Mother    Liver disease Father    Esophageal cancer Father        Survived cancer treatment and surgery   Drug abuse Father    Cirrhosis Father        NAFLD   Diabetes Mellitus II Sister    Heart attack Sister    Diabetes Brother    Depression Brother    Heart attack Brother    Heart disease Brother    Diabetes Maternal Grandmother    Arthritis Maternal Grandmother    Diabetes Maternal Grandfather    Cancer Paternal Grandmother    Hearing loss Paternal Grandmother    Liver disease Paternal Grandfather    Review of Systems  Constitutional:  Negative for chills, fever and malaise/fatigue.  Eyes:  Negative for blurred vision and double vision.  Respiratory:  Negative for cough and shortness of breath.   Cardiovascular:  Negative for chest pain, palpitations and leg swelling.  Gastrointestinal:  Negative for abdominal pain,  heartburn, nausea and vomiting.  Musculoskeletal:  Positive for myalgias (chronic) and neck pain (chronic).  Neurological:  Negative for dizziness, tingling, weakness and headaches.  Psychiatric/Behavioral:  Negative for depression, substance abuse and suicidal ideas. The patient has insomnia. The patient is not nervous/anxious.    Objective    BP 102/80   Ht 5\' 1"  (1.549 m)   Wt 186 lb (84.4 kg)   BMI 35.14 kg/m   Physical Exam Constitutional:      Appearance: Normal appearance.  Cardiovascular:     Rate and Rhythm: Normal rate and regular rhythm.     Pulses: Normal pulses.     Heart sounds: Normal heart sounds.  Pulmonary:     Effort: Pulmonary effort is normal.     Breath sounds: Normal breath sounds.  Neurological:     Mental Status: She is alert.  Psychiatric:        Mood and Affect: Mood normal.        Behavior: Behavior normal.    Assessment & Plan:   1. Encounter to establish care Patient presents today to establish care with new primary care provider. Reviewed past medical history with patient (see in EPIC), family history, surgical history, social history and medication allergies/contraindications. Patient has concerns today about neck pain she has been dealing with since 12/2022.   2. Neck pain Patient reports that she has been dealing with chronic neck pain, radiating down to her left elbow, since June. She reports having gastric bypass surgery in March 2024 and think her neck pain is related to overuse of her upper extremities. Denies numbness or tingling in her left arm. No red flags present on neuro exam. She has been seeing a chiropractor since June but is still having pain and would like to try another intervention for pain relief. Due to her diabetes, she is unable to tolerate steroid injections. Due to her family and personal history of liver disease, she is unable to tolerate large amounts of Tylenol. Due to her gastric bypass, she is also unable to tolerate  NSAIDs. Discussed limited options, patient would like to be referred to physical therapy. Will place as urgent referral, since she has been dealing with pain since June.  - Ambulatory referral to Physical Therapy  3. Gastric bypass status for obesity Patient had gastric bypass surgery completed in March 2024. She reports she has been recovering well since the surgery.  Denies issues with heartburn, N/V, infection, or weight gain. She is followed by specialist (Dr. Andrey Campanile) and sees him about every 6 months. She reports she needs specific labs drawn and would like to complete these today. Will update patient with results.   - CBC with Differential/Platelet - Comprehensive metabolic panel - Vitamin B12 - Iron, TIBC and Ferritin Panel - Vitamin B1 - Folate - Vitamin D 1,25 dihydroxy  4. B12 deficiency See #3- history of gastric bypass - Vitamin B12  5. Hypertension associated with diabetes [facility]) Patient has a history of hypertension and is currently taking losartan 50mg  daily. As she continues to manage her weight and elevated blood sugar levels. Denies chest pain, palpitations, changes in vision, shortness of breath, lower extremity edema, headaches, lightheadedness, weakness, cough, and syncopal episodes. Reasonable to decrease losartan to 25mg  daily. Advised patient to continue to monitor home BP readings and return to office sooner than scheduled follow-up if readings start to increase.  - Comprehensive metabolic panel - losartan (COZAAR) 25 MG tablet; Take 1 tablet (25 mg total) by mouth daily.  Dispense: 90 tablet; Refill: 2  6. Primary insomnia Patient reports having chronic insomnia, since 2004. She reports she does take 10g Ambien nightly as needed for this and reports doing well on current regimen. PDMP reviewed, no red flags present. Refill provided today.  - zolpidem (AMBIEN) 10 MG tablet; TAKE 1 TABLET BY MOUTH EVERY NIGHT AT BEDTIME AS NEEDED FOR INSOMNIA  Dispense: 30 tablet;  Refill: 0  7. Type 2 diabetes mellitus without complication, with Feldhaus-term current use of insulin (HCC) Patient reports being on Ozempic in 2022 and did not having any weight loss success with medication. In March 2023, she developed pancreatitis and DKA, due to GLP-1 agonist and was placed on insulin. [facility], who referred her for surgical intervention. March 2024, patient had gastric bypass completed. She currently monitors her blood glucose with CGM and is injecting Humalog (sliding scale) & Tresiba 25 units int he AM & 20 units in the PM. She is followed by endocrinology- who is watching her nighttime hypoglycemic events.   8. Major depression, recurrent, chronic (HCC) Patient reports she has a history of depression and is currently on disability for this chronic disease. PHQ9 and GAD7 completed today. Patient is currently taking fluoxetine 40mg  daily and feels her depression is well managed with current regimen. No refills needed today.   9. Fibromyalgia Patient was diagnosed in 2000 with fibromyalgia and currently on disability for this chronic disease. She is able to manage her symptoms with tramadol as needed, which is not frequent. She reports her fibromyalgia flares up with an increase in activity and is able to manage her symptoms well. PDMP reviewed, no red flags present. Refills provided.  - traMADol (ULTRAM) 50 MG tablet; 1 tab po bid prn pain  Dispense: 30 tablet; Refill: 0   Return in about 6 weeks (around 03/28/2023) for HTN follow-up.   Alyson Reedy, FNP

## 2023-02-16 ENCOUNTER — Encounter (HOSPITAL_BASED_OUTPATIENT_CLINIC_OR_DEPARTMENT_OTHER): Payer: Self-pay | Admitting: Family Medicine

## 2023-02-22 ENCOUNTER — Other Ambulatory Visit (HOSPITAL_BASED_OUTPATIENT_CLINIC_OR_DEPARTMENT_OTHER): Payer: Self-pay | Admitting: Family Medicine

## 2023-02-22 ENCOUNTER — Encounter (HOSPITAL_BASED_OUTPATIENT_CLINIC_OR_DEPARTMENT_OTHER): Payer: Self-pay

## 2023-02-22 ENCOUNTER — Ambulatory Visit (INDEPENDENT_AMBULATORY_CARE_PROVIDER_SITE_OTHER): Payer: Medicare Other

## 2023-02-22 ENCOUNTER — Telehealth (HOSPITAL_BASED_OUTPATIENT_CLINIC_OR_DEPARTMENT_OTHER): Payer: Self-pay

## 2023-02-22 VITALS — BP 137/61 | Ht 61.0 in | Wt 182.0 lb

## 2023-02-22 DIAGNOSIS — Z0000 Encounter for general adult medical examination without abnormal findings: Secondary | ICD-10-CM | POA: Diagnosis not present

## 2023-02-22 DIAGNOSIS — Z1159 Encounter for screening for other viral diseases: Secondary | ICD-10-CM

## 2023-02-22 NOTE — Telephone Encounter (Signed)
I will call [facility] tomorrow to check on the status of her labs, confirmed with lab tech that labs were drawn unsure why results have not come back

## 2023-02-22 NOTE — Patient Instructions (Addendum)
[name] , Thank you for taking time to come for your Medicare Wellness Visit. I appreciate your ongoing commitment to your health goals. Please review the following plan we discussed and let me know if I can assist you in the future.   These are the goals we discussed:  Goals      Patient Stated     "Continue to improve my blood sugar"        This is a list of the screening recommended for you and due dates:  Health Maintenance  Topic Date Due   Hepatitis C Screening  Never done   COVID-19 Vaccine (3 - Pfizer risk series) 12/03/2019   Zoster (Shingles) Vaccine (2 of 2) 01/05/2021   Yearly kidney health urinalysis for diabetes  03/05/2021   Eye exam for diabetics  01/02/2022   Complete foot exam   02/02/2022   Flu Shot  02/10/2023   Hemoglobin A1C  03/09/2023   Mammogram  07/14/2023   Yearly kidney function blood test for diabetes  09/22/2023   Medicare Annual Wellness Visit  02/22/2024   DTaP/Tdap/Td vaccine (3 - Td or Tdap) 07/12/2025   Colon Cancer Screening  01/09/2028   HIV Screening  Completed   HPV Vaccine  Aged Out    Advanced directives: Advance directive discussed with you today. Even though you declined this today, please call our office should you change your mind, and we can give you the proper paperwork for you to fill out. Advance care planning is a way to make decisions about medical care that fits your values in case you are ever unable to make these decisions for yourself.  Information on Advanced Care Planning can be found at [facility] of [place] Advance Health Care Directives Advance Health Care Directives ([email or web address]    Conditions/risks identified:  Aim for 30 minutes of exercise or brisk walking, 6-8 glasses of water, and 5 servings of fruits and vegetables each day. A Hepatitis C Screening has been ordered for you today. You do not have to fast to have this lab drawn.  Managing Pain Without Opioids Opioids are strong medicines used to treat  moderate to severe pain. For some people, especially those who have [place]-term (chronic) pain, opioids may not be the best choice for pain management due to: Side effects like nausea, constipation, and sleepiness. The risk of addiction (opioid use disorder). The longer you take opioids, the greater your risk of addiction. Pain that lasts for more than 3 months is called chronic pain. Managing chronic pain usually requires more than one approach and is often provided by a team of health care providers working together (multidisciplinary approach). Pain management may be done at a pain management center or pain clinic. How to manage pain without the use of opioids Use non-opioid medicines Non-opioid medicines for pain may include: Over-the-counter or prescription non-steroidal anti-inflammatory drugs (NSAIDs). These may be the first medicines used for pain. They work well for muscle and bone pain, and they reduce swelling. Acetaminophen. This over-the-counter medicine may work well for milder pain but not swelling. Antidepressants. These may be used to treat chronic pain. A certain type of antidepressant (tricyclics) is often used. These medicines are given in lower doses for pain than when used for depression. Anticonvulsants. These are usually used to treat seizures but may also reduce nerve (neuropathic) pain. Muscle relaxants. These relieve pain caused by sudden muscle tightening (spasms). You may also use a pain medicine that is applied to the skin as  a patch, cream, or gel (topical analgesic), such as a numbing medicine. These may cause fewer side effects than medicines taken by mouth. Do certain therapies as directed Some therapies can help with pain management. They include: Physical therapy. You will do exercises to gain strength and flexibility. A physical therapist may teach you exercises to move and stretch parts of your body that are weak, stiff, or painful. You can learn these exercises at  physical therapy visits and practice them at home. Physical therapy may also involve: Massage. Heat wraps or applying heat or cold to affected areas. Electrical signals that interrupt pain signals (transcutaneous electrical nerve stimulation, TENS). Weak lasers that reduce pain and swelling (low-level laser therapy). Signals from your body that help you learn to regulate pain (biofeedback). Occupational therapy. This helps you to learn ways to function at home and work with less pain. Recreational therapy. This involves trying new activities or hobbies, such as a physical activity or drawing. Mental health therapy, including: Cognitive behavioral therapy (CBT). This helps you learn coping skills for dealing with pain. Acceptance and commitment therapy (ACT) to change the way you think and react to pain. Relaxation therapies, including muscle relaxation exercises and mindfulness-based stress reduction. Pain management counseling. This may be individual, family, or group counseling.  Receive medical treatments Medical treatments for pain management include: Nerve block injections. These may include a pain blocker and anti-inflammatory medicines. You may have injections: Near the spine to relieve chronic back or neck pain. Into joints to relieve back or joint pain. Into nerve areas that supply a painful area to relieve body pain. Into muscles (trigger point injections) to relieve some painful muscle conditions. A medical device placed near your spine to help block pain signals and relieve nerve pain or chronic back pain (spinal cord stimulation device). Acupuncture. Follow these instructions at home Medicines Take over-the-counter and prescription medicines only as told by your health care provider. If you are taking pain medicine, ask your health care providers about possible side effects to watch out for. Do not drive or use heavy machinery while taking prescription opioid pain  medicine. Lifestyle  Do not use drugs or alcohol to reduce pain. If you drink alcohol, limit how much you have to: 0-1 drink a day for women who are not pregnant. 0-2 drinks a day for men. Know how much alcohol is in a drink. In the [place] one drink equals one 12 oz bottle of beer (355 mL), one 5 oz glass of wine (148 mL), or one 1 oz glass of hard liquor (44 mL). Do not use any products that contain nicotine or tobacco. These products include cigarettes, chewing tobacco, and vaping devices, such as e-cigarettes. If you need help quitting, ask your health care provider. Eat a healthy diet and maintain a healthy weight. Poor diet and excess weight may make pain worse. Eat foods that are high in fiber. These include fresh fruits and vegetables, whole grains, and beans. Limit foods that are high in fat and processed sugars, such as fried and sweet foods. Exercise regularly. Exercise lowers stress and may help relieve pain. Ask your health care provider what activities and exercises are safe for you. If your health care provider approves, join an exercise class that combines movement and stress reduction. Examples include yoga and tai chi. Get enough sleep. Lack of sleep may make pain worse. Lower stress as much as possible. Practice stress reduction techniques as told by your therapist. General instructions Work with all your  pain management providers to find the treatments that work best for you. You are an important member of your pain management team. There are many things you can do to reduce pain on your own. Consider joining an online or in-person support group for people who have chronic pain. Keep all follow-up visits. This is important. Where to find more information You can find more information about managing pain without opioids from: American Academy of Pain Medicine: painmed.org Institute for Chronic Pain: instituteforchronicpain.org American Chronic Pain Association:  theacpa.org Contact a health care provider if: You have side effects from pain medicine. Your pain gets worse or does not get better with treatments or home therapy. You are struggling with anxiety or depression. Summary Many types of pain can be managed without opioids. Chronic pain may respond better to pain management without opioids. Pain is best managed when you and a team of health care providers work together. Pain management without opioids may include non-opioid medicines, medical treatments, physical therapy, mental health therapy, and lifestyle changes. Tell your health care providers if your pain gets worse or is not being managed well enough. This information is not intended to replace advice given to you by your health care provider. Make sure you discuss any questions you have with your health care provider. Document Revised: 10/08/2020 Document Reviewed: 10/08/2020 Elsevier Patient Education  2024 Elsevier Inc.   Next appointment: VIRTUAL/TELEPHONE APPOINTMENT Follow up in one year for your annual wellness visit.  February 28, 2024 at 2:30pm telephone visit.   Preventive Care 40-64 Years, Female Preventive care refers to lifestyle choices and visits with your health care provider that can promote health and wellness. What does preventive care include? A yearly physical exam. This is also called an annual well check. Dental exams once or twice a year. Routine eye exams. Ask your health care provider how often you should have your eyes checked. Personal lifestyle choices, including: Daily care of your teeth and gums. Regular physical activity. Eating a healthy diet. Avoiding tobacco and drug use. Limiting alcohol use. Practicing safe sex. Taking low-dose aspirin daily starting at age 30. Taking vitamin and mineral supplements as recommended by your health care provider. What happens during an annual well check? The services and screenings done by your health care  provider during your annual well check will depend on your age, overall health, lifestyle risk factors, and family history of disease. Counseling  Your health care provider may ask you questions about your: Alcohol use. Tobacco use. Drug use. Emotional well-being. Home and relationship well-being. Sexual activity. Eating habits. Work and work Astronomer. Method of birth control. Menstrual cycle. Pregnancy history. Screening  You may have the following tests or measurements: Height, weight, and BMI. Blood pressure. Lipid and cholesterol levels. These may be checked every 5 years, or more frequently if you are over 103 years old. Skin check. Lung cancer screening. You may have this screening every year starting at age 20 if you have a 30-pack-year history of smoking and currently smoke or have quit within the past 15 years. Fecal occult blood test (FOBT) of the stool. You may have this test every year starting at age 23. Flexible sigmoidoscopy or colonoscopy. You may have a sigmoidoscopy every 5 years or a colonoscopy every 10 years starting at age 34. Hepatitis C blood test. Hepatitis B blood test. Sexually transmitted disease (STD) testing. Diabetes screening. This is done by checking your blood sugar (glucose) after you have not eaten for a while (fasting). You may  have this done every 1-3 years. Mammogram. This may be done every 1-2 years. Talk to your health care provider about when you should start having regular mammograms. This may depend on whether you have a family history of breast cancer. BRCA-related cancer screening. This may be done if you have a family history of breast, ovarian, tubal, or peritoneal cancers. Pelvic exam and Pap test. This may be done every 3 years starting at age 85. Starting at age 76, this may be done every 5 years if you have a Pap test in combination with an HPV test. Bone density scan. This is done to screen for osteoporosis. You may have this scan  if you are at high risk for osteoporosis. Discuss your test results, treatment options, and if necessary, the need for more tests with your health care provider. Vaccines  Your health care provider may recommend certain vaccines, such as: Influenza vaccine. This is recommended every year. Tetanus, diphtheria, and acellular pertussis (Tdap, Td) vaccine. You may need a Td booster every 10 years. Zoster vaccine. You may need this after age 54. Pneumococcal 13-valent conjugate (PCV13) vaccine. You may need this if you have certain conditions and were not previously vaccinated. Pneumococcal polysaccharide (PPSV23) vaccine. You may need one or two doses if you smoke cigarettes or if you have certain conditions. Talk to your health care provider about which screenings and vaccines you need and how often you need them. This information is not intended to replace advice given to you by your health care provider. Make sure you discuss any questions you have with your health care provider. Document Released: 07/25/2015 Document Revised: 03/17/2016 Document Reviewed: 04/29/2015 Elsevier Interactive Patient Education  2017 [employer].    Fall Prevention in the Home Falls can cause injuries. They can happen to people of all ages. There are many things you can do to make your home safe and to help prevent falls. What can I do on the outside of my home? Regularly fix the edges of walkways and driveways and fix any cracks. Remove anything that might make you trip as you walk through a door, such as a raised step or threshold. Trim any bushes or trees on the path to your home. Use bright outdoor lighting. Clear any walking paths of anything that might make someone trip, such as rocks or tools. Regularly check to see if handrails are loose or broken. Make sure that both sides of any steps have handrails. Any raised decks and porches should have guardrails on the edges. Have any leaves, snow, or ice cleared  regularly. Use sand or salt on walking paths during winter. Clean up any spills in your garage right away. This includes oil or grease spills. What can I do in the bathroom? Use night lights. Install grab bars by the toilet and in the tub and shower. Do not use towel bars as grab bars. Use non-skid mats or decals in the tub or shower. If you need to sit down in the shower, use a plastic, non-slip stool. Keep the floor dry. Clean up any water that spills on the floor as soon as it happens. Remove soap buildup in the tub or shower regularly. Attach bath mats securely with double-sided non-slip rug tape. Do not have throw rugs and other things on the floor that can make you trip. What can I do in the bedroom? Use night lights. Make sure that you have a light by your bed that is easy to reach. Do not  use any sheets or blankets that are too big for your bed. They should not hang down onto the floor. Have a firm chair that has side arms. You can use this for support while you get dressed. Do not have throw rugs and other things on the floor that can make you trip. What can I do in the kitchen? Clean up any spills right away. Avoid walking on wet floors. Keep items that you use a lot in easy-to-reach places. If you need to reach something above you, use a strong step stool that has a grab bar. Keep electrical cords out of the way. Do not use floor polish or wax that makes floors slippery. If you must use wax, use non-skid floor wax. Do not have throw rugs and other things on the floor that can make you trip. What can I do with my stairs? Do not leave any items on the stairs. Make sure that there are handrails on both sides of the stairs and use them. Fix handrails that are broken or loose. Make sure that handrails are as [place] as the stairways. Check any carpeting to make sure that it is firmly attached to the stairs. Fix any carpet that is loose or worn. Avoid having throw rugs at the top or  bottom of the stairs. If you do have throw rugs, attach them to the floor with carpet tape. Make sure that you have a light switch at the top of the stairs and the bottom of the stairs. If you do not have them, ask someone to add them for you. What else can I do to help prevent falls? Wear shoes that: Do not have high heels. Have rubber bottoms. Are comfortable and fit you well. Are closed at the toe. Do not wear sandals. If you use a stepladder: Make sure that it is fully opened. Do not climb a closed stepladder. Make sure that both sides of the stepladder are locked into place. Ask someone to hold it for you, if possible. Clearly mark and make sure that you can see: Any grab bars or handrails. First and last steps. Where the edge of each step is. Use tools that help you move around (mobility aids) if they are needed. These include: Canes. Walkers. Scooters. Crutches. Turn on the lights when you go into a dark area. Replace any light bulbs as soon as they burn out. Set up your furniture so you have a clear path. Avoid moving your furniture around. If any of your floors are uneven, fix them. If there are any pets around you, be aware of where they are. Review your medicines with your doctor. Some medicines can make you feel dizzy. This can increase your chance of falling. Ask your doctor what other things that you can do to help prevent falls. This information is not intended to replace advice given to you by your health care provider. Make sure you discuss any questions you have with your health care provider. Document Released: 04/24/2009 Document Revised: 12/04/2015 Document Reviewed: 08/02/2014 Elsevier Interactive Patient Education  2017 Elsevier Inc. Diabetes Mellitus and Foot Care Diabetes, also called diabetes mellitus, may cause problems with your feet and legs because of poor blood flow (circulation). Poor circulation may make your skin: Become thinner and drier. Break more  easily. Heal more slowly. Peel and crack. You may also have nerve damage (neuropathy). This can cause decreased feeling in your legs and feet. This means that you may not notice minor injuries to your  feet that could lead to more serious problems. Finding and treating problems early is the best way to prevent future foot problems. How to care for your feet Foot hygiene  Wash your feet daily with warm water and mild soap. Do not use hot water. Then, pat your feet and the areas between your toes until they are fully dry. Do not soak your feet. This can dry your skin. Trim your toenails straight across. Do not dig under them or around the cuticle. File the edges of your nails with an emery board or nail file. Apply a moisturizing lotion or petroleum jelly to the skin on your feet and to dry, brittle toenails. Use lotion that does not contain alcohol and is unscented. Do not apply lotion between your toes. Shoes and socks Wear clean socks or stockings every day. Make sure they are not too tight. Do not wear knee-high stockings. These may decrease blood flow to your legs. Wear shoes that fit well and have enough cushioning. Always look in your shoes before you put them on to be sure there are no objects inside. To break in new shoes, wear them for just a few hours a day. This prevents injuries on your feet. Wounds, scrapes, corns, and calluses  Check your feet daily for blisters, cuts, bruises, sores, and redness. If you cannot see the bottom of your feet, use a mirror or ask someone for help. Do not cut off corns or calluses or try to remove them with medicine. If you find a minor scrape, cut, or break in the skin on your feet, keep it and the skin around it clean and dry. You may clean these areas with mild soap and water. Do not clean the area with peroxide, alcohol, or iodine. If you have a wound, scrape, corn, or callus on your foot, look at it several times a day to make sure it is healing and  not infected. Check for: Redness, swelling, or pain. Fluid or blood. Warmth. Pus or a bad smell. General tips Do not cross your legs. This may decrease blood flow to your feet. Do not use heating pads or hot water bottles on your feet. They may burn your skin. If you have lost feeling in your feet or legs, you may not know this is happening until it is too late. Protect your feet from hot and cold by wearing shoes, such as at the beach or on hot pavement. Schedule a complete foot exam at least once a year or more often if you have foot problems. Report any cuts, sores, or bruises to your health care provider right away. Where to find more information American Diabetes Association: diabetes.org Association of Diabetes Care & Education Specialists: diabeteseducator.org Contact a health care provider if: You have a condition that increases your risk of infection, and you have any cuts, sores, or bruises on your feet. You have an injury that is not healing. You have redness on your legs or feet. You feel burning or tingling in your legs or feet. You have pain or cramps in your legs and feet. Your legs or feet are numb. Your feet always feel cold. You have pain around any toenails. Get help right away if: You have a wound, scrape, corn, or callus on your foot and: You have signs of infection. You have a fever. You have a red line going up your leg. This information is not intended to replace advice given to you by your health care provider. Make  sure you discuss any questions you have with your health care provider. Document Revised: 12/30/2021 Document Reviewed: 12/30/2021 Elsevier Patient Education  2024 [employer].  Fall Prevention in the Home, Adult Falls can cause injuries and can happen to people of all ages. There are many things you can do to make your home safer and to help prevent falls. What actions can I take to prevent falls? General information Use good lighting in all  rooms. Make sure to: Replace any light bulbs that burn out. Turn on the lights in dark areas and use night-lights. Keep items that you use often in easy-to-reach places. Lower the shelves around your home if needed. Move furniture so that there are clear paths around it. Do not use throw rugs or other things on the floor that can make you trip. If any of your floors are uneven, fix them. Add color or contrast paint or tape to clearly mark and help you see: Grab bars or handrails. First and last steps of staircases. Where the edge of each step is. If you use a ladder or stepladder: Make sure that it is fully opened. Do not climb a closed ladder. Make sure the sides of the ladder are locked in place. Have someone hold the ladder while you use it. Know where your pets are as you move through your home. What can I do in the bathroom?     Keep the floor dry. Clean up any water on the floor right away. Remove soap buildup in the bathtub or shower. Buildup makes bathtubs and showers slippery. Use non-skid mats or decals on the floor of the bathtub or shower. Attach bath mats securely with double-sided, non-slip rug tape. If you need to sit down in the shower, use a non-slip stool. Install grab bars by the toilet and in the bathtub and shower. Do not use towel bars as grab bars. What can I do in the bedroom? Make sure that you have a light by your bed that is easy to reach. Do not use any sheets or blankets on your bed that hang to the floor. Have a firm chair or bench with side arms that you can use for support when you get dressed. What can I do in the kitchen? Clean up any spills right away. If you need to reach something above you, use a step stool with a grab bar. Keep electrical cords out of the way. Do not use floor polish or wax that makes floors slippery. What can I do with my stairs? Do not leave anything on the stairs. Make sure that you have a light switch at the top and the  bottom of the stairs. Make sure that there are handrails on both sides of the stairs. Fix handrails that are broken or loose. Install non-slip stair treads on all your stairs if they do not have carpet. Avoid having throw rugs at the top or bottom of the stairs. Choose a carpet that does not hide the edge of the steps on the stairs. Make sure that the carpet is firmly attached to the stairs. Fix carpet that is loose or worn. What can I do on the outside of my home? Use bright outdoor lighting. Fix the edges of walkways and driveways and fix any cracks. Clear paths of anything that can make you trip, such as tools or rocks. Add color or contrast paint or tape to clearly mark and help you see anything that might make you trip as you walk  through a door, such as a raised step or threshold. Trim any bushes or trees on paths to your home. Check to see if handrails are loose or broken and that both sides of all steps have handrails. Install guardrails along the edges of any raised decks and porches. Have leaves, snow, or ice cleared regularly. Use sand, salt, or ice melter on paths if you live where there is ice and snow during the winter. Clean up any spills in your garage right away. This includes grease or oil spills. What other actions can I take? Review your medicines with your doctor. Some medicines can cause dizziness or changes in blood pressure, which increase your risk of falling. Wear shoes that: Have a low heel. Do not wear high heels. Have rubber bottoms and are closed at the toe. Feel good on your feet and fit well. Use tools that help you move around if needed. These include: Canes. Walkers. Scooters. Crutches. Ask your doctor what else you can do to help prevent falls. This may include seeing a physical therapist to learn to do exercises to move better and get stronger. Where to find more information Centers for Disease Control and Prevention, STEADI: TonerPromos.no [employer]  on Aging: BaseRingTones.pl National Institute on Aging: BaseRingTones.pl Contact a doctor if: You are afraid of falling at home. You feel weak, drowsy, or dizzy at home. You fall at home. Get help right away if you: Lose consciousness or have trouble moving after a fall. Have a fall that causes a head injury. These symptoms may be an emergency. Get help right away. Call 911. Do not wait to see if the symptoms will go away. Do not drive yourself to the hospital. This information is not intended to replace advice given to you by your health care provider. Make sure you discuss any questions you have with your health care provider. Document Revised: 03/01/2022 Document Reviewed: 03/01/2022 Elsevier Patient Education  2024 [employer].

## 2023-02-22 NOTE — Progress Notes (Signed)
 Because this visit was a virtual/telehealth visit,  certain criteria was not obtained, such a blood pressure, CBG if patient is a diabetic, and timed up and go. Any medications not marked as "taking" was not mentioned during the medication reconciliation part of the visit. Any vitals not documented were not able to be obtained due to this being a telehealth visit. Vitals documented are verbally provided by the patient.    Subjective:   Megan Murray is a 57 y.o. female who presents for Medicare Annual (Subsequent) preventive examination.  Visit Complete: Virtual  I connected with  Megan Murray on 02/22/23 by a audio enabled telemedicine application and verified that I am speaking with the correct person using two identifiers.  Patient Location: Home  Provider Location: Home Office  I discussed the limitations of evaluation and management by telemedicine. The patient expressed understanding and agreed to proceed.  Patient Medicare AWV questionnaire was completed by the patient on na; I have confirmed that all information answered by patient is correct and no changes since this date.  Review of Systems     Cardiac Risk Factors include: advanced age (>45men, >86 women);diabetes mellitus;dyslipidemia;hypertension;obesity (BMI >30kg/m2)     Objective:    Today's Vitals   02/22/23 1348 02/22/23 1351  BP: 137/61   Weight: 182 lb (82.6 kg)   Height: 5\' 1"  (1.549 m)   PainSc:  6    Body mass index is 34.39 kg/m.     02/22/2023    1:48 PM 09/21/2022   11:00 AM 09/21/2022    5:51 AM 09/08/2022    7:56 AM 04/22/2022    2:01 PM 04/06/2022    8:53 AM 02/12/2022    1:28 AM  Advanced Directives  Does Patient Have a Medical Advance Directive? No Yes Yes Yes Yes Yes No  Type of Special educational needs teacher of [employer] Power of [place];Living will Healthcare Power of [place];Living will  Healthcare Power of [place];Living will   Does patient want to make changes to  medical advance directive?  No - Patient declined   No - Patient declined No - Patient declined   Would patient like information on creating a medical advance directive? No - Patient declined      No - Patient declined    Current Medications (verified) Outpatient Encounter Medications as of 02/22/2023  Medication Sig   Bempedoic Acid-Ezetimibe (NEXLIZET) 180-10 MG TABS Take 1 tablet by mouth daily.   Continuous Blood Gluc Receiver (FREESTYLE LIBRE 2 READER) DEVI Use as directed   Continuous Glucose Sensor (FREESTYLE LIBRE 2 SENSOR) MISC USE SENSOR AS DIRECTED TO TEST BLOOD SUGAR   FLUoxetine (PROZAC) 40 MG capsule Take 1 capsule (40 mg total) by mouth daily.   fluticasone-salmeterol (WIXELA INHUB) 250-50 MCG/ACT AEPB Inhale 1 puff into the lungs 2 (two) times daily.   insulin lispro (HUMALOG KWIKPEN) 100 UNIT/ML KwikPen Inject 6 Units into the skin 3 (three) times daily. (Patient taking differently: Inject 5-14 Units into the skin 3 (three) times daily. Sliding scale)   losartan (COZAAR) 25 MG tablet Take 1 tablet (25 mg total) by mouth daily.   ONETOUCH ULTRA test strip USE TO CHECK THREE TIMES DAILY   traMADol (ULTRAM) 50 MG tablet 1 tab po bid prn pain   TRESIBA FLEXTOUCH 200 UNIT/ML FlexTouch Pen Inject 25 Units into the skin as directed. 25 units in the morning and 15 units at night   zolpidem (AMBIEN) 10 MG tablet TAKE 1 TABLET BY MOUTH  EVERY NIGHT AT BEDTIME AS NEEDED FOR INSOMNIA   No facility-administered encounter medications on file as of 02/22/2023.    Allergies (verified) Codeine, Atorvastatin, Cholestyramine, Crestor [rosuvastatin calcium], Fenofibrate, Simvastatin, Soy allergy, Statins, Ozempic (0.25 or 0.5 mg-dose) [semaglutide(0.25 or 0.5mg -dos)], Repatha [evolocumab], Synjardy [empagliflozin-metformin hcl], Trulicity [dulaglutide], and Meloxicam   History: Past Medical History:  Diagnosis Date   Anxiety    Asthma    Atypical chest pain 02/20/2021   B12 deficiency  01/23/2015   Chronic fatigue 04/26/2019   Coronary artery disease moderate based on coronary CT angio of circumflex 04/29/2021   Cough variant asthma 08/10/2019   after has chest cold   COVID-19 05/14/2021   Depression    Diabetes mellitus (HCC) 02/19/2021   Diabetes mellitus without complication (HCC)    DKA (diabetic ketoacidosis) (HCC) 09/27/2021   Dyslipidemia, goal to be determined    High triglycerides and LDL   Dyspnea on exertion 40/98/1191   Essential hypertension 11/09/2016   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 Evelene Croon   Mixed hyperlipidemia 11/04/2016   Failed statins; hyperglycemia with repatha. Stopped 06/2018    Myofascial pain 11/12/2019   Obesity due to excess calories without serious comorbidity    Osteoarthritis, multiple sites 04/25/2018   Other fatigue 01/20/2022   Other hyperlipidemia 01/20/2022   Pancreatic disease    PCOS (polycystic ovarian syndrome)    Pneumonia    PONV (postoperative nausea and vomiting)    Primary osteoarthritis of left knee 11/12/2019   Primary osteoarthritis of right knee 11/12/2019   Prolonged QT interval 09/27/2021   Reactive airway disease 02/19/2021   Seasonal allergic rhinitis due to pollen 05/19/2018   SOBOE (shortness of breath on exertion)    Statin intolerance 06/21/2018   Type 2 diabetes mellitus without complication, without Zieger-term current use of insulin (HCC) 11/09/2016   Vitamin D deficiency    Past Surgical History:  Procedure Laterality Date   ABDOMINAL HYSTERECTOMY     CESAREAN SECTION  1994   GALLBLADDER SURGERY  1995   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   Procedure: LAPAROSCOPIC ROUX-EN-Y GASTRIC BYPASS WITH UPPER ENDOSCOPY, LYSIS OF ADHESIONS X 30 MIN;   Surgeon: Gaynelle Adu, MD;  Location: [facility];  Service: General;  Laterality: N/A;   REDUCTION MAMMAPLASTY     Family History  Problem Relation Age of Onset   Heart disease Mother    Hypertension Mother    COPD Mother        Died at 83   Heart failure Mother        Not sure of details   Depression Mother    Liver disease Father    Esophageal cancer Father        Survived cancer treatment and surgery   Drug abuse Father    Cirrhosis Father        NAFLD   Diabetes Mellitus II Sister    Heart attack Sister    Diabetes Brother    Depression Brother    Heart attack Brother    Heart disease Brother    Diabetes Maternal Grandmother    Arthritis Maternal Grandmother    Diabetes Maternal Grandfather    Cancer Paternal Grandmother    Hearing loss Paternal  Grandmother    Liver disease Paternal Grandfather    Social History   Socioeconomic History   Marital status: Married    Spouse name: Vernell Barrier   Number of children: 1   Years of education: Not on file   Highest education level: Not on file  Occupational History   Occupation: disabled due to fibromyalgia and depression  Tobacco Use   Smoking status: Never   Smokeless tobacco: Never  Vaping Use   Vaping status: Never Used  Substance and Sexual Activity   Alcohol use: No   Drug use: No   Sexual activity: Yes  Other Topics Concern   Not on file  Social History Narrative   Not on file   Social Determinants of Health   Financial Resource Strain: Low Risk  (02/22/2023)   Overall Financial Resource Strain (CARDIA)    Difficulty of Paying Living Expenses: Not hard at all  Food Insecurity: No Food Insecurity (02/22/2023)   Hunger Vital Sign    Worried About Running Out of Food in the Last Year: Never true    Ran Out of Food in the Last Year: Never true  Transportation Needs: No Transportation Needs (02/22/2023)   PRAPARE - Administrator, Civil Service (Medical): No    Lack of Transportation (Non-Medical):  No  Physical Activity: Insufficiently Active (02/22/2023)   Exercise Vital Sign    Days of Exercise per Week: 4 days    Minutes of Exercise per Session: 30 min  Stress: Stress Concern Present (02/22/2023)   [employer] of Occupational Health - Occupational Stress Questionnaire    Feeling of Stress : To some extent  Social Connections: Moderately Integrated (02/22/2023)   Social Connection and Isolation Panel [NHANES]    Frequency of Communication with Friends and Family: More than three times a week    Frequency of Social Gatherings with Friends and Family: More than three times a week    Attends Religious Services: More than 4 times per year    Active Member of [employer] or Organizations: No    Attends Engineer, structural: Never    Marital Status: Married    Tobacco Counseling Counseling given: Yes   Clinical Intake:  Pre-visit preparation completed: Yes  Pain : 0-10 Pain Score: 6  Pain Type: Chronic pain Pain Location: Shoulder (and left arm) Pain Orientation: Left Pain Descriptors / Indicators: Tightness (pinching) Pain Onset: More than a month ago Pain Frequency: Constant     BMI - recorded: 34.39 Nutritional Status: BMI > 30  Obese Nutritional Risks: None Diabetes: Yes CBG done?: No (telehealth visit. patient wears a continous glucose monitor) Did pt. bring in CBG monitor from home?: No  How often do you need to have someone help you when you read instructions, pamphlets, or other written materials from your doctor or pharmacy?: 1 - Never  Interpreter Needed?: No  Information entered by ::  Benedicto Capozzi,CMA   Activities of Daily Living    02/22/2023    2:03 PM 09/21/2022   11:00 AM  In your present state of health, do you have any difficulty performing the following activities:  Hearing? 0 0  Vision? 0 0  Difficulty concentrating or making decisions? 0 0  Walking or climbing stairs? 0 0  Dressing or bathing? 0 0  Doing errands, shopping? 0 0   Preparing Food and eating ? N   Using the Toilet? N   In the past six months, have you accidently leaked urine? N  Do you have problems with loss of bowel control? N   Managing your Medications? N   Managing your Finances? N   Housekeeping or managing your Housekeeping? N     Patient Care Team: Alyson Reedy, FNP as PCP - General (Family Medicine) Georgeanna Lea, MD as PCP - Cardiology (Cardiology) Milagros Evener, MD as Consulting Physician (Psychiatry) Marykay Lex, MD as Consulting Physician (Cardiology) Zenovia Jordan, MD as Consulting Physician (Rheumatology)  Indicate any recent Medical Services you may have received from other than [facility] providers in the past year (date may be approximate).     Assessment:   This is a routine wellness examination for Elly.  Hearing/Vision screen Hearing Screening - Comments:: Patient denies any hearing difficulties.   Vision Screening - Comments:: Patient states they wear reading glasses only.  She is utd with yearly eye exams with Dr. Arrie Aran   Dietary issues and exercise activities discussed:     Goals Addressed             This Visit's Progress    Patient Stated       "Continue to improve my blood sugar"       Depression Screen    02/22/2023    1:56 PM 02/14/2023    9:46 AM 06/09/2022   11:25 PM 04/22/2022    1:57 PM 02/22/2022    1:36 PM 01/20/2022   11:19 AM 11/23/2021    1:49 PM  PHQ 2/9 Scores  PHQ - 2 Score 0 2  2 2 6 4   PHQ- 9 Score 4 6  9 7 11 11      Information is confidential and restricted. Go to Review Flowsheets to unlock data.    Fall Risk    02/22/2023    2:03 PM 04/22/2022    1:57 PM 10/05/2021   12:59 PM 09/15/2021    1:23 PM 01/04/2020    9:29 AM  Fall Risk   Falls in the past year? 0 0 0 0 0  Number falls in past yr: 0  0 0   Injury with Fall? 0  0 0   Risk for fall due to : No Fall Risks   No Fall Risks   Follow up Falls prevention discussed  Falls evaluation completed Falls  evaluation completed     MEDICARE RISK AT HOME:  Medicare Risk at Home - 02/22/23 1402     Any stairs in or around the home? No    If so, are there any without handrails? No    Home free of loose throw rugs in walkways, pet beds, electrical cords, etc? Yes    Adequate lighting in your home to reduce risk of falls? Yes    Life alert? No    Use of a cane, walker or w/c? No    Grab bars in the bathroom? Yes    Shower chair or bench in shower? No    Elevated toilet seat or a handicapped toilet? No             TIMED UP AND GO:  Was the test performed?  No    Cognitive Function:        02/22/2023    1:55 PM 09/15/2021    1:33 PM  6CIT Screen  What Year? 0 points 0 points  What month? 0 points 0 points  What time? 0 points 0 points  Count back from 20 0 points 0 points  Months in reverse 0 points 0 points  Repeat phrase 0 points 0 points  Total Score 0 points 0 points    Immunizations Immunization History  Administered Date(s) Administered   Fluad Quad(high Dose 65+) 03/28/2019, 05/06/2021   Influenza,inj,Quad PF,6+ Mos 04/23/2022   Influenza-Unspecified 03/12/2018   PFIZER(Purple Top)SARS-COV-2 Vaccination 10/15/2019, 11/05/2019   PNEUMOCOCCAL CONJUGATE-20 02/02/2021   Pneumococcal Polysaccharide-23 01/21/2015   Tdap 03/03/2012, 07/13/2015   Zoster Recombinant(Shingrix) 11/10/2020    TDAP status: Up to date  Flu Vaccine status: Due, Education has been provided regarding the importance of this vaccine. Advised may receive this vaccine at local pharmacy or Health Dept. Aware to provide a copy of the vaccination record if obtained from local pharmacy or Health Dept. Verbalized acceptance and understanding.  Pneumococcal vaccine status: NOT AGE APPROPRIATE FOR THIS PATIENT   Covid-19 vaccine status: Information provided on how to obtain vaccines.   Qualifies for Shingles Vaccine? Yes   Zostavax completed No   Shingrix Completed?: No.    Education has been provided  regarding the importance of this vaccine. Patient has been advised to call insurance company to determine out of pocket expense if they have not yet received this vaccine. Advised may also receive vaccine at local pharmacy or Health Dept. Verbalized acceptance and understanding.  Screening Tests Health Maintenance  Topic Date Due   Hepatitis C Screening  Never done   COVID-19 Vaccine (3 - Pfizer risk series) 12/03/2019   Zoster Vaccines- Shingrix (2 of 2) 01/05/2021   Diabetic kidney evaluation - Urine ACR  03/05/2021   OPHTHALMOLOGY EXAM  01/02/2022   FOOT EXAM  02/02/2022   Medicare Annual Wellness (AWV)  09/16/2022   INFLUENZA VACCINE  02/10/2023   HEMOGLOBIN A1C  03/09/2023   MAMMOGRAM  07/14/2023   Diabetic kidney evaluation - eGFR measurement  09/22/2023   DTaP/Tdap/Td (3 - Td or Tdap) 07/12/2025   Colonoscopy  01/09/2028   HIV Screening  Completed   HPV VACCINES  Aged Out    Health Maintenance  Health Maintenance Due  Topic Date Due   Hepatitis C Screening  Never done   COVID-19 Vaccine (3 - Pfizer risk series) 12/03/2019   Zoster Vaccines- Shingrix (2 of 2) 01/05/2021   Diabetic kidney evaluation - Urine ACR  03/05/2021   OPHTHALMOLOGY EXAM  01/02/2022   FOOT EXAM  02/02/2022   Medicare Annual Wellness (AWV)  09/16/2022   INFLUENZA VACCINE  02/10/2023    Colorectal cancer screening: Type of screening: Colonoscopy. Completed 01/08/2018. Repeat every 10 years  Mammogram status: Completed 07/13/2022. Repeat every year  Bone Density Screening: NOT AGE APPROPRIATE FOR THIS PATIENT   Lung Cancer Screening: (Low Dose CT Chest recommended if Age 39-80 years, 20 pack-year currently smoking OR have quit w/in 15years.) does not qualify.   Lung Cancer Screening Referral: na  Additional Screening:  Hepatitis C Screening: does qualify; Ordered 02/22/2023  Vision Screening: Recommended annual ophthalmology exams for early detection of glaucoma and other disorders of the  eye. Is the patient up to date with their annual eye exam?  Yes  Who is the provider or what is the name of the office in which the patient attends annual eye exams? Dr. Arrie Aran If pt is not established with a provider, would they like to be referred to a provider to establish care? No .   Dental Screening: Recommended annual dental exams for proper oral hygiene  Diabetic Foot Exam: Diabetic Foot Exam: Overdue, Pt has been advised about the importance in completing this exam. Pt is scheduled for  diabetic foot exam on provider notified.  Community Resource Referral / Chronic Care Management: CRR required this visit?  No   CCM required this visit?  No     Plan:     I have personally reviewed and noted the following in the patient's chart:   Medical and social history Use of alcohol, tobacco or illicit drugs  Current medications and supplements including opioid prescriptions. Patient is currently taking opioid prescriptions. Information provided to patient regarding non-opioid alternatives. Patient advised to discuss non-opioid treatment plan with their provider. Functional ability and status Nutritional status Physical activity Advanced directives List of other physicians Hospitalizations, surgeries, and ER visits in previous 12 months Vitals Screenings to include cognitive, depression, and falls Referrals and appointments  In addition, I have reviewed and discussed with patient certain preventive protocols, quality metrics, and best practice recommendations. A written personalized care plan for preventive services as well as general preventive health recommendations were provided to patient.     Jordan Hawks Martin Smeal, CMA   02/22/2023   After Visit Summary: (MyChart) Due to this being a telephonic visit, the after visit summary with patients personalized plan was offered to patient via MyChart   Nurse Notes: Patient wants a return call regarding her Tramadol and labs she had  drawn last week.

## 2023-02-22 NOTE — Telephone Encounter (Signed)
 During patient's AWV, she stated that she is waiting for a prior auth for her tramadol. She also wants to speak to someone regarding the labs she had drawn last week after her appointment. She states she is unable to see anything in her mychart. She would like a return call.   Thank you.   Jahnia Hewes, CMA  [facility] AWV Team Direct Dial: [phone]

## 2023-02-23 NOTE — Telephone Encounter (Signed)
Labs resulted back today, provider is aware will get them reviewed within 24-48 hours and reach out to patient. Unsure about PA for tramadol, reached out to patient to discuss labs and medication unable to reach LVM.

## 2023-02-24 ENCOUNTER — Encounter (HOSPITAL_BASED_OUTPATIENT_CLINIC_OR_DEPARTMENT_OTHER): Payer: Self-pay | Admitting: Family Medicine

## 2023-02-28 ENCOUNTER — Other Ambulatory Visit (HOSPITAL_BASED_OUTPATIENT_CLINIC_OR_DEPARTMENT_OTHER): Payer: Self-pay | Admitting: Family Medicine

## 2023-02-28 DIAGNOSIS — Z8349 Family history of other endocrine, nutritional and metabolic diseases: Secondary | ICD-10-CM

## 2023-03-07 ENCOUNTER — Ambulatory Visit (HOSPITAL_BASED_OUTPATIENT_CLINIC_OR_DEPARTMENT_OTHER): Payer: Medicare Other | Admitting: Family Medicine

## 2023-03-08 DIAGNOSIS — F419 Anxiety disorder, unspecified: Secondary | ICD-10-CM | POA: Insufficient documentation

## 2023-03-08 DIAGNOSIS — R112 Nausea with vomiting, unspecified: Secondary | ICD-10-CM | POA: Insufficient documentation

## 2023-03-08 DIAGNOSIS — J189 Pneumonia, unspecified organism: Secondary | ICD-10-CM | POA: Insufficient documentation

## 2023-03-08 DIAGNOSIS — Z9889 Other specified postprocedural states: Secondary | ICD-10-CM | POA: Insufficient documentation

## 2023-03-10 ENCOUNTER — Ambulatory Visit: Payer: Medicare Other | Attending: Cardiology | Admitting: Cardiology

## 2023-03-10 ENCOUNTER — Encounter: Payer: Self-pay | Admitting: Cardiology

## 2023-03-10 VITALS — BP 130/68 | HR 60 | Ht 61.0 in | Wt 186.1 lb

## 2023-03-10 DIAGNOSIS — I2510 Atherosclerotic heart disease of native coronary artery without angina pectoris: Secondary | ICD-10-CM | POA: Diagnosis not present

## 2023-03-10 DIAGNOSIS — E782 Mixed hyperlipidemia: Secondary | ICD-10-CM

## 2023-03-10 DIAGNOSIS — I10 Essential (primary) hypertension: Secondary | ICD-10-CM

## 2023-03-10 DIAGNOSIS — Z794 Long term (current) use of insulin: Secondary | ICD-10-CM

## 2023-03-10 DIAGNOSIS — Z789 Other specified health status: Secondary | ICD-10-CM | POA: Diagnosis not present

## 2023-03-10 DIAGNOSIS — E1159 Type 2 diabetes mellitus with other circulatory complications: Secondary | ICD-10-CM | POA: Diagnosis not present

## 2023-03-10 DIAGNOSIS — I152 Hypertension secondary to endocrine disorders: Secondary | ICD-10-CM

## 2023-03-10 NOTE — Progress Notes (Signed)
Cardiology Office Note:    Date:  03/10/2023   ID:  GHALIA BIBBS, DOB [birth year], MRN [id number]    PCP:  Alyson Reedy, FNP  Cardiologist:  Garwin Brothers, MD   Referring MD: No ref. provider found    ASSESSMENT:    1. Coronary artery disease involving native coronary artery of native heart without angina pectoris   2. Essential hypertension   3. Hypertension associated with diabetes (HCC)   4. Statin intolerance    PLAN:    In order of problems listed above:  Coronary artery disease: Secondary prevention stressed to the patient.  Importance of compliance with diet medications stressed and she vocalized understanding.  She was advised to walk at least 30 minutes 5 to 6 days a week and she promises to do so. Essential hypertension: Blood pressure stable and diet was emphasized.  Lifestyle modification urged. Mixed dyslipidemia: On lipid-lowering medications followed by primary care.  She will have blood work today and we will advise her about her lipids.  Goal LDL less than 60. Obesity: Weight reduction stressed diet emphasized and she promises to do better.  Risks of obesity explained. Patient will be seen in follow-up appointment in 6 months or earlier if the patient has any concerns.    Medication Adjustments/Labs and Tests Ordered: Current medicines are reviewed at length with the patient today.  Concerns regarding medicines are outlined above.  No orders of the defined types were placed in this encounter.  No orders of the defined types were placed in this encounter.    No chief complaint on file.    History of Present Illness:    Megan Murray is a 57 y.o. female.  Patient has past medical history of coronary artery disease, essential hypertension, mixed dyslipidemia and diabetes mellitus.  Patient mentions to me that she walks about 30 to 40 minutes 3-4 times a week.  No chest pain orthopnea or PND.  At the time of my evaluation, the patient is alert awake  oriented and in no distress.  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 Evelene Croon   Mixed hyperlipidemia 11/04/2016   Failed statins; hyperglycemia with repatha. Stopped 06/2018    Myofascial pain 11/12/2019   Neck pain 02/14/2023   Obesity due to excess calories without serious comorbidity    Osteoarthritis, multiple sites 04/25/2018   Pancreatic disease    Pneumonia    PONV (postoperative nausea and vomiting)    Primary insomnia 02/14/2023   Primary osteoarthritis of left knee 11/12/2019   Primary osteoarthritis of right knee 11/12/2019   Prolonged QT interval 09/27/2021   Seasonal allergic rhinitis due to pollen 05/19/2018   Statin intolerance 06/21/2018   Type 2 diabetes mellitus without complication, without [place]-term current use of insulin (HCC) 11/09/2016   Vitamin D deficiency     Past Surgical History:  Procedure Laterality Date   ABDOMINAL HYSTERECTOMY  CESAREAN SECTION  1994   GALLBLADDER SURGERY  1995   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   Procedure: LAPAROSCOPIC ROUX-EN-Y GASTRIC BYPASS WITH UPPER ENDOSCOPY, LYSIS OF ADHESIONS X 30 MIN;  Surgeon: Gaynelle Adu, MD;  Location: [facility];  Service: General;  Laterality: N/A;   REDUCTION MAMMAPLASTY      Current Medications: Current Meds  Medication Sig   Bempedoic Acid-Ezetimibe (NEXLIZET) 180-10 MG TABS Take 1 tablet by mouth daily.   Continuous Blood Gluc  Receiver (FREESTYLE LIBRE 2 READER) DEVI Use as directed   Continuous Glucose Sensor (FREESTYLE LIBRE 2 SENSOR) MISC USE SENSOR AS DIRECTED TO TEST BLOOD SUGAR   FLUoxetine (PROZAC) 40 MG capsule Take 1 capsule (40 mg total) by mouth daily.   fluticasone-salmeterol (WIXELA INHUB) 250-50 MCG/ACT AEPB Inhale 1 puff into the lungs 2 (two) times daily.   insulin lispro (HUMALOG KWIKPEN) 100 UNIT/ML KwikPen Inject 6 Units into the skin 3 (three) times daily.   losartan (COZAAR) 25 MG tablet Take 1 tablet (25 mg total) by mouth daily.   ONETOUCH ULTRA test strip USE TO CHECK THREE TIMES DAILY   traMADol (ULTRAM) 50 MG tablet 1 tab po bid prn pain   TRESIBA FLEXTOUCH 200 UNIT/ML FlexTouch Pen Inject 25 Units into the skin as directed. 25 units in the morning and 15 units at night   zolpidem (AMBIEN) 10 MG tablet TAKE 1 TABLET BY MOUTH EVERY NIGHT AT BEDTIME AS NEEDED FOR INSOMNIA     Allergies:   Codeine, Atorvastatin, Cholestyramine, Crestor [rosuvastatin calcium], Fenofibrate, Simvastatin, Soy allergy, Statins, Ozempic (0.25 or 0.5 mg-dose) [semaglutide(0.25 or 0.5mg -dos)], Repatha [evolocumab], Synjardy [empagliflozin-metformin hcl], Trulicity [dulaglutide], and Meloxicam   Social History   Socioeconomic History   Marital status: Married    Spouse name: Vernell Barrier   Number of children: 1   Years of education: Not on file   Highest education level: Not on file  Occupational History   Occupation: disabled due to fibromyalgia and depression  Tobacco Use   Smoking status: Never   Smokeless tobacco: Never  Vaping Use   Vaping status: Never Used  Substance and Sexual Activity   Alcohol use: No   Drug use: No   Sexual activity: Yes  Other Topics Concern   Not on file  Social History Narrative   Not on file   Social Determinants of Health   Financial Resource Strain: Low Risk  (02/22/2023)   Overall Financial Resource Strain (CARDIA)    Difficulty of Paying Living Expenses: Not hard at  all  Food Insecurity: No Food Insecurity (02/22/2023)   Hunger Vital Sign    Worried About Running Out of Food in the Last Year: Never true    Ran Out of Food in the Last Year: Never true  Transportation Needs: No Transportation Needs (02/22/2023)   PRAPARE - Administrator, Civil Service (Medical): No    Lack of Transportation (Non-Medical): No  Physical Activity: Insufficiently Active (02/22/2023)   Exercise Vital Sign    Days of Exercise per Week: 4 days    Minutes of Exercise per Session: 30 min  Stress: Stress Concern Present (02/22/2023)   [employer] of Occupational Health - Occupational Stress Questionnaire    Feeling of Stress : To some extent  Social Connections: Moderately Integrated (02/22/2023)   Social Connection and Isolation Panel [NHANES]    Frequency of Communication with Friends and Family: More than three times a week    Frequency of Social Gatherings with Friends and  Family: More than three times a week    Attends Religious Services: More than 4 times per year    Active Member of Clubs or Organizations: No    Attends Engineer, structural: Never    Marital Status: Married     Family History: The patient's family history includes Arthritis in her maternal grandmother; COPD in her mother; Cancer in her paternal grandmother; Cirrhosis in her father; Depression in her brother and mother; Diabetes in her brother, maternal grandfather, and maternal grandmother; Diabetes Mellitus II in her sister; Drug abuse in her father; Esophageal cancer in her father; Hearing loss in her paternal grandmother; Heart attack in her brother and sister; Heart disease in her brother and mother; Heart failure in her mother; Hypertension in her mother; Liver disease in her father and paternal grandfather.  ROS:   Please see the history of present illness.    All other systems reviewed and are negative.  EKGs/Labs/Other Studies Reviewed:    The following studies were  reviewed today: I discussed my findings with the patient at length.   Recent Labs: 02/14/2023: ALT 15; BUN 12; Creatinine, Ser 0.47; Hemoglobin 14.4; Platelets 227; Potassium 4.3; Sodium 141  Recent Lipid Panel    Component Value Date/Time   CHOL 208 (H) 06/23/2022 0936   CHOL 238 (H) 01/20/2022 1226   TRIG 237.0 (H) 06/23/2022 0936   HDL 41.40 06/23/2022 0936   HDL 44 01/20/2022 1226   CHOLHDL 5 06/23/2022 0936   VLDL 47.4 (H) 06/23/2022 0936   LDLCALC 158 (H) 01/20/2022 1226   LDLDIRECT 145.0 06/23/2022 0936    Physical Exam:    VS:  BP 130/68   Pulse 60   Ht 5\' 1"  (1.549 m)   Wt 186 lb 1.3 oz (84.4 kg)   SpO2 96%   BMI 35.16 kg/m     Wt Readings from Last 3 Encounters:  03/10/23 186 lb 1.3 oz (84.4 kg)  02/22/23 182 lb (82.6 kg)  02/14/23 186 lb (84.4 kg)     GEN: Patient is in no acute distress HEENT: Normal NECK: No JVD; No carotid bruits LYMPHATICS: No lymphadenopathy CARDIAC: Hear sounds regular, 2/6 systolic murmur at the apex. RESPIRATORY:  Clear to auscultation without rales, wheezing or rhonchi  ABDOMEN: Soft, non-tender, non-distended MUSCULOSKELETAL:  No edema; No deformity  SKIN: Warm and dry NEUROLOGIC:  Alert and oriented x 3 PSYCHIATRIC:  Normal affect   Signed, Garwin Brothers, MD  03/10/2023 10:30 AM    [facility]

## 2023-03-10 NOTE — Patient Instructions (Signed)
Medication Instructions:  Your physician recommends that you continue on your current medications as directed. Please refer to the Current Medication list given to you today.  *If you need a refill on your cardiac medications before your next appointment, please call your pharmacy*   Lab Work: Your physician recommends that you have a lipids today in the office.  If you have labs (blood work) drawn today and your tests are completely normal, you will receive your results only by: MyChart Message (if you have MyChart) OR A paper copy in the mail If you have any lab test that is abnormal or we need to change your treatment, we will call you to review the results.   Testing/Procedures: None ordered   Follow-Up: At [facility], you and your health needs are our priority.  As part of our continuing mission to provide you with exceptional heart care, we have created designated Provider Care Teams.  These Care Teams include your primary Cardiologist (physician) and Advanced Practice Providers (APPs -  Physician Assistants and Nurse Practitioners) who all work together to provide you with the care you need, when you need it.  We recommend signing up for the patient portal called "MyChart".  Sign up information is provided on this After Visit Summary.  MyChart is used to connect with patients for Virtual Visits (Telemedicine).  Patients are able to view lab/test results, encounter notes, upcoming appointments, etc.  Non-urgent messages can be sent to your provider as well.   To learn more about what you can do with MyChart, go to [email or web address].    Your next appointment:   6 month(s)  The format for your next appointment:   In Person  Provider:   Belva Crome, MD    Other Instructions none  Important Information About Sugar

## 2023-03-11 LAB — LIPID PANEL
Chol/HDL Ratio: 4.5 ratio — ABNORMAL HIGH (ref 0.0–4.4)
Cholesterol, Total: 199 mg/dL (ref 100–199)
HDL: 44 mg/dL (ref >39)
LDL Chol Calc (NIH): 128 mg/dL — ABNORMAL HIGH (ref 0–99)
Triglycerides: 150 mg/dL — ABNORMAL HIGH (ref 0–149)
VLDL Cholesterol Cal: 27 mg/dL (ref 5–40)

## 2023-03-11 NOTE — Addendum Note (Signed)
Addended by: Eleonore Chiquito on: 03/11/2023 08:56 AM   Modules accepted: Orders

## 2023-03-16 ENCOUNTER — Other Ambulatory Visit: Payer: Self-pay | Admitting: Medical

## 2023-03-18 ENCOUNTER — Other Ambulatory Visit: Payer: Self-pay

## 2023-03-18 ENCOUNTER — Ambulatory Visit (HOSPITAL_BASED_OUTPATIENT_CLINIC_OR_DEPARTMENT_OTHER): Payer: Medicare Other | Attending: Family Medicine | Admitting: Physical Therapy

## 2023-03-18 ENCOUNTER — Encounter (HOSPITAL_BASED_OUTPATIENT_CLINIC_OR_DEPARTMENT_OTHER): Payer: Self-pay | Admitting: Physical Therapy

## 2023-03-18 DIAGNOSIS — M542 Cervicalgia: Secondary | ICD-10-CM | POA: Diagnosis present

## 2023-03-18 DIAGNOSIS — M79602 Pain in left arm: Secondary | ICD-10-CM | POA: Insufficient documentation

## 2023-03-18 DIAGNOSIS — M25612 Stiffness of left shoulder, not elsewhere classified: Secondary | ICD-10-CM | POA: Insufficient documentation

## 2023-03-18 NOTE — Therapy (Addendum)
OUTPATIENT PHYSICAL THERAPY CERVICAL EVALUATION   Patient Name: Megan Murray MRN: [id number] DOB:Sep 11, 1965, 57 y.o., female Today's Date: 03/18/2023  END OF SESSION:  PT End of Session - 03/18/23 0910     Visit Number 1    Number of Visits 12    Date for PT Re-Evaluation 04/29/23    Authorization Type [employer] MCR    PT Start Time 0850    PT Stop Time 0935    PT Time Calculation (min) 45 min    Activity Tolerance Patient tolerated treatment well    Behavior During Therapy [facility] for tasks assessed/performed             Past Medical History:  Diagnosis Date   Anxiety    Asthma    B12 deficiency 01/23/2015   Contusion of left wrist 07/29/2015   Coronary artery disease moderate based on coronary CT angio of circumflex 04/29/2021   Essential hypertension 11/09/2016   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   Fatty liver 05/16/2018   By CT scan   Fatty liver    Fibromyalgia    Gastric bypass status for obesity 09/21/2022   Gastroesophageal reflux disease without esophagitis 04/26/2019   Hypertension associated with diabetes (HCC) 11/09/2016   Joint pain    Major depression, recurrent, chronic (HCC) 04/25/2018   Dr. Evelene Croon   Mixed hyperlipidemia 11/04/2016   Failed statins; hyperglycemia with repatha. Stopped 06/2018    Myofascial pain 11/12/2019   Neck pain 02/14/2023   Obesity due to excess calories without serious comorbidity    Osteoarthritis, multiple sites 04/25/2018   Pancreatic disease    Pneumonia    PONV (postoperative nausea and vomiting)    Primary insomnia 02/14/2023   Primary osteoarthritis of left knee 11/12/2019   Primary osteoarthritis of right knee 11/12/2019   Prolonged QT interval 09/27/2021   Seasonal allergic rhinitis due to pollen 05/19/2018   Statin intolerance 06/21/2018   Type 2 diabetes mellitus without complication, without [place]-term current use of insulin (HCC) 11/09/2016   Vitamin D deficiency    Past Surgical History:  Procedure  Laterality Date   ABDOMINAL HYSTERECTOMY     CESAREAN SECTION  1994   GALLBLADDER SURGERY  1995   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   Procedure: LAPAROSCOPIC ROUX-EN-Y GASTRIC BYPASS WITH UPPER ENDOSCOPY, LYSIS OF ADHESIONS X 30 MIN;  Surgeon: Gaynelle Adu, MD;  Location: [facility];  Service: General;  Laterality: N/A;   REDUCTION MAMMAPLASTY     Patient Active Problem List   Diagnosis Date Noted   Anxiety    Pneumonia    PONV (postoperative nausea and vomiting)    Neck pain 02/14/2023   Primary insomnia 02/14/2023   Gastric bypass status for obesity 09/21/2022   Asthma 05/05/2022   Joint pain 05/05/2022   Pancreatic disease 05/05/2022   Vitamin D deficiency 01/20/2022   Prolonged QT interval 09/27/2021   Coronary artery disease moderate based on coronary CT angio of circumflex 04/29/2021   Obesity due to excess calories without serious comorbidity 02/19/2021   Myofascial pain 11/12/2019   Primary osteoarthritis of left knee 11/12/2019   Primary osteoarthritis of right knee 11/12/2019   Gastroesophageal reflux disease without esophagitis 04/26/2019   Statin intolerance 06/21/2018   Seasonal allergic rhinitis due to pollen 05/19/2018   Fatty liver 05/16/2018   Fibromyalgia 04/25/2018   Major depression, recurrent, chronic (HCC) 04/25/2018   Osteoarthritis, multiple sites 04/25/2018   Hypertension associated with diabetes (HCC) 11/09/2016   Type 2 diabetes mellitus without complication, without [place]-term current  use of insulin (HCC) 11/09/2016   Essential hypertension 11/09/2016   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   Mixed hyperlipidemia 11/04/2016   Contusion of left wrist 07/29/2015   B12 deficiency 01/23/2015     REFERRING PROVIDER: Alyson Reedy, FNP  REFERRING DIAG: M54.2 (ICD-10-CM) - Neck pain  THERAPY DIAG:  Cervicalgia  Stiffness of left shoulder, not elsewhere classified  Pain in left arm  M25.60  Stiffness of unspecified joint, not elsewhere  classified.  Rationale for Evaluation and Treatment: Rehabilitation  ONSET DATE: May 2024  SUBJECTIVE:                                                                                                                                                                                                         SUBJECTIVE STATEMENT: Pain began in May and she had no specific MOI.  Pt has been seeing chiropractor since June.  Pt had x rays and was informed she has a pinched nerve.  Pt states her shoulder mobility improved with chiropractor care though her pain did not improve.      PT order indicated Chronic neck pain radiating to left arm and left elbow- since June with no improvement with chiropractor.  Pt c/o's of constant pain.  Pt has difficulty donning shirt and bra.  Pt unable to vacuum and mop and has pain with household chores.   Pt performs aquatic classes 2x/wk and walks 3 miles 2-3x/wk at [employer].   Hand dominance: Right  PERTINENT HISTORY:  DM type 2, fibromyalgia, myofascial pain, HTN, depression, insomnia, OA including knees Gastric bypass 09/2022  PAIN:  NPRS:  4/10 current, 3/10 best, 9/10 worst Type:  constant, sharp, dull, and pinching.  Pt denies N/T Location:  L UT, L cervical, L clavicle.  Pain can travel down L UE to hand.   Alleviating factors:  walking, tylenol, ice, massage, dry needling Aggravating factors:  reaching laterally, prolonged sitting including in the car, lifting objects  PRECAUTIONS: Other: fibromyalgia  RED FLAGS: None     WEIGHT BEARING RESTRICTIONS: No  FALLS:  Has patient fallen in last 6 months? No  LIVING ENVIRONMENT: Lives with: lives with their spouse Lives in: one level home   OCCUPATION: Pt is on disability.   PLOF: Independent  PATIENT GOALS: to be pain free and move more freely.  OBJECTIVE:   DIAGNOSTIC FINDINGS:  Pt had x rays at chiropractor office.  PT unable to view findings.    PATIENT SURVEYS:  NDI:   22  COGNITION: Overall cognitive status: Within functional limits for tasks assessed  SENSATION:  2+ sensation to LT t/o bilat UE dermatomes.     PALPATION: TTP in bilat UT. Pt has moderate soft tissue tightness in R UT and moderate (+) soft tissue tightness on L.   CERVICAL ROM:   Active ROM A/PROM (deg) eval  Flexion WFL  Extension 51  Right lateral flexion 19 with pain  Left lateral flexion 22  Right rotation 57  Left rotation 45   (Blank rows = not tested)  UPPER EXTREMITY ROM:  Active ROM Right eval Left eval  Shoulder flexion WFL Pain at 111 deg, 131 total AROM  Shoulder extension    Shoulder abduction 130 92 with pain  Shoulder adduction    Shoulder extension    Shoulder internal rotation    Shoulder external rotation    Elbow flexion    Elbow extension    Wrist flexion    Wrist extension    Wrist ulnar deviation    Wrist radial deviation    Wrist pronation    Wrist supination     (Blank rows = not tested)  Repeated Movements:   Cervical flexion:  no change in pain, felt a stretch in upper thoracic Cervical extension:  no change in pain, feels a little looser in L UT  UPPER EXTREMITY MMT:  MMT Right eval Left eval  Shoulder flexion 4/5 Tol min resistance  Shoulder extension    Shoulder abduction    Shoulder adduction    Shoulder extension    Shoulder internal rotation    Shoulder external rotation    Middle trapezius    Lower trapezius    Elbow flexion 5/5 5/5  Elbow extension 5/5 5/5  Wrist flexion    Wrist extension    Wrist ulnar deviation    Wrist radial deviation    Wrist pronation    Wrist supination    Grip strength     (Blank rows = not tested)  CERVICAL SPECIAL TESTS:  Compression Test:  positive for pain Spurling's Test: negative    TODAY'S TREATMENT:                                                                                                                               Pt performed seated UT stretch 2x20 sec  bilat and cervical retraction 2x10 reps.  PT instructed pt she should not have increased pain with exercises.  PT instructed pt to stop exercise and avoid any activities/positions that cause peripheralization of pain.  Pt received a HEP handout and was educated in correct form and appropriate frequency.   PATIENT EDUCATION:  Education details: dx, objective findings, relevant anatomy, prognosis, HEP, POC, and rationale of interventions.  PT answered pt's questions.  Person educated: Patient Education method: Explanation, Demonstration, Tactile cues, Verbal cues, and Handouts Education comprehension: verbalized understanding, returned demonstration, verbal cues required, tactile cues required, and needs further education  HOME EXERCISE PROGRAM: Access Code: [email or web address] URL: [email or web address] Date: 03/18/2023 Prepared by: Aaron Edelman  Exercises - Seated Upper Trapezius Stretch  - 2 x daily - 7 x weekly - 2-3 reps - 20 seconds hold - Seated Cervical Retraction  - 2 x daily - 7 x weekly - 2 sets - 10 reps  ASSESSMENT:  CLINICAL IMPRESSION: Patient is a 57 y.o. female with a dx of neck pain presenting to the clinic with cervicalgia and limited ROM in cervical and L shoulder.  Pt has difficulty with dressing and performing household chores.  Pt has increased pain with reaching laterally, prolonged sitting, and lifting objects.  Pt had no change in pain with cervical repeated movements.  Pt should benefit from skilled PT services to address impairments, reduce pain, and improve overall function.    OBJECTIVE IMPAIRMENTS: decreased activity tolerance, decreased ROM, decreased strength, hypomobility, increased fascial restrictions, increased muscle spasms, impaired flexibility, impaired UE functional use, and pain.   ACTIVITY LIMITATIONS: lifting, sitting, and dressing  PARTICIPATION LIMITATIONS: cleaning  PERSONAL FACTORS: 3+ comorbidities: DM type 2, fibromyalgia, myofascial  pain, and OA  are also affecting patient's functional outcome.   REHAB POTENTIAL: Good  CLINICAL DECISION MAKING: Stable/uncomplicated  EVALUATION COMPLEXITY: Low   GOALS:  SHORT TERM GOALS: Target date: 04/08/2023   Pt will be independent and compliant with HEP for improved pain, ROM, strength, and function.   Baseline:  Goal status: INITIAL  2.  Pt will report at least a 25% improvement in pain overall.  Baseline:  Goal status: INITIAL  3.  Pt will demo improved L shoulder AROM to 145-150 deg in flexion and 115 deg in abduction for improved reaching and shoulder mobility.  Baseline:  Goal status: INITIAL    [place] TERM GOALS: Target date: 04/29/2023   Pt will demo improved cervical AROM to at least 65/60 deg in R/L rotation and 35 deg in bilat Sb'ing for improved stiffness and mobility with daily activities Baseline:  Goal status: INITIAL  2.  Pt will be able to dress without difficulty and without increased pain.  Baseline:  Goal status: INITIAL  3.  Pt will be able to perform her normal household chores without significant difficulty.  Baseline:  Goal status: INITIAL  4.  Pt will deny having pain distal to L shoulder and report her worst pain to be no > than 3/10. Baseline:  Goal status: INITIAL  5.  Pt will reports she is able to perform her normal reaching activities without significant pain and difficulty.  Baseline:  Goal status: INITIAL    PLAN:  PT FREQUENCY: 2x/week  PT DURATION: 6 weeks  PLANNED INTERVENTIONS: Therapeutic exercises, Therapeutic activity, Neuromuscular re-education, Patient/Family education, Self Care, Joint mobilization, Stair training, Aquatic Therapy, Dry Needling, Electrical stimulation, Spinal mobilization, Cryotherapy, Moist heat, Taping, Ultrasound, Manual therapy, and Re-evaluation  PLAN FOR NEXT SESSION: review and perform HEP.  Cervical ROM and flexibility, postural strengthening, and manual techniques including  STM.   Audie Clear III PT, DPT 03/18/23 10:55 PM  Date of referral: 02/14/2023 Referring provider: Alyson Reedy, FNP Referring diagnosis? M54.2 (ICD-10-CM) - Neck pain Treatment diagnosis? (if different than referring diagnosis)  Cervicalgia  Stiffness of left shoulder, not elsewhere classified  Pain in left arm  M25.60  Stiffness of unspecified joint, not elsewhere classified.  What was this (referring dx) caused by? No specific MOI  Ashby Dawes of Condition: Chronic (continuous duration > 3 months)   Laterality: Lt  Current Functional Measure Score: Neck Index 22  Objective measurements identify impairments when they are compared to normal values, the uninvolved extremity,  and prior level of function.  [x]  Yes  []  No  Objective assessment of functional ability: Moderate functional limitations   Briefly describe symptoms: constant, sharp, dull, and pinching pain. Location:  L UT, L cervical, L clavicle.  Pain can travel down L UE to hand.  Pain with lateral reaching and lifting.   How did symptoms start: Sx's began in May with no specific MOI  Average pain intensity:  NPRS:  4/10 current, 3/10 best, 9/10 worst  How often does the pt experience symptoms? Constantly  How much have the symptoms interfered with usual daily activities?  Pt has difficulty donning shirt and bra.  Pt unable to vacuum and mop and has pain with household chores.  How has condition changed since care began at this facility? Pt is just starting PT.  This is her initial evaluation.  In general, how is the patients overall health?  Audie Clear III PT, DPT 04/08/23 5:06 PM

## 2023-03-22 ENCOUNTER — Ambulatory Visit: Payer: Medicare Other

## 2023-03-24 ENCOUNTER — Other Ambulatory Visit (HOSPITAL_BASED_OUTPATIENT_CLINIC_OR_DEPARTMENT_OTHER): Payer: Self-pay | Admitting: Family Medicine

## 2023-03-24 DIAGNOSIS — F5101 Primary insomnia: Secondary | ICD-10-CM

## 2023-03-24 MED ORDER — ZOLPIDEM TARTRATE 10 MG PO TABS: ORAL_TABLET | ORAL | 0 refills | Status: DC

## 2023-03-24 NOTE — Progress Notes (Signed)
PDMP reviewed. No red flags. Refill sent for Ambien.

## 2023-03-28 ENCOUNTER — Encounter (HOSPITAL_BASED_OUTPATIENT_CLINIC_OR_DEPARTMENT_OTHER): Payer: Self-pay | Admitting: Family Medicine

## 2023-03-28 ENCOUNTER — Ambulatory Visit (INDEPENDENT_AMBULATORY_CARE_PROVIDER_SITE_OTHER): Payer: Medicare Other | Admitting: Family Medicine

## 2023-03-28 VITALS — BP 123/70 | HR 60 | Wt 185.4 lb

## 2023-03-28 DIAGNOSIS — G720 Drug-induced myopathy: Secondary | ICD-10-CM

## 2023-03-28 DIAGNOSIS — T380X5A Adverse effect of glucocorticoids and synthetic analogues, initial encounter: Secondary | ICD-10-CM | POA: Insufficient documentation

## 2023-03-28 DIAGNOSIS — E1159 Type 2 diabetes mellitus with other circulatory complications: Secondary | ICD-10-CM

## 2023-03-28 DIAGNOSIS — I152 Hypertension secondary to endocrine disorders: Secondary | ICD-10-CM | POA: Diagnosis not present

## 2023-03-28 DIAGNOSIS — Z23 Encounter for immunization: Secondary | ICD-10-CM

## 2023-03-28 DIAGNOSIS — E119 Type 2 diabetes mellitus without complications: Secondary | ICD-10-CM

## 2023-03-28 DIAGNOSIS — Z794 Long term (current) use of insulin: Secondary | ICD-10-CM

## 2023-03-28 NOTE — Progress Notes (Signed)
Established Patient Office Visit  Subjective   Patient ID: Megan Murray, female    DOB: 08/30/1965  Age: 57 y.o. MRN: [id number]  HYPERTENSION: JAYLIANNA PICKLESIMER is a 57 year-old patient who presents for the medical management of hypertension.  Patient's current hypertension medication regimen is: losartan 25mg  daily  Patient is currently taking prescribed medications for HTN.  Patient is regularly keeping a check on BP at home. "About the same at home"  Adhering to low sodium diet: yes Exercising Regularly: yes Denies headache, dizziness, CP, SHOB, vision changes.    BP Readings from Last 3 Encounters:  03/28/23 123/70  03/10/23 130/68  02/22/23 137/61    Patient sees [facility]- A1c 5.9 Will request records   The 10-year ASCVD risk score (Arnett DK, et al., 2019) is: 6.9%   Values used to calculate the score:     Age: 51 years     Sex: Female     Is Non-Hispanic African American: No     Diabetic: Yes     Tobacco smoker: No     Systolic Blood Pressure: 123 mmHg     Is BP treated: Yes     HDL Cholesterol: 44 mg/dL     Total Cholesterol: 199 mg/dL   Review of Systems  Constitutional:  Negative for malaise/fatigue and weight loss.  Eyes:  Negative for blurred vision and double vision.  Respiratory:  Negative for cough and shortness of breath.   Cardiovascular:  Negative for chest pain, palpitations and leg swelling.  Gastrointestinal:  Negative for abdominal pain, nausea and vomiting.  Musculoskeletal:  Negative for myalgias.  Neurological:  Negative for dizziness, weakness and headaches.  Psychiatric/Behavioral:  Negative for depression and suicidal ideas. The patient is not nervous/anxious.       Objective:     BP 123/70   Pulse 60   Wt 185 lb 6.4 oz (84.1 kg)   SpO2 97%   BMI 35.03 kg/m  BP Readings from Last 3 Encounters:  03/28/23 123/70  03/10/23 130/68  02/22/23 137/61     Physical Exam Constitutional:      Appearance: Normal  appearance.  Cardiovascular:     Rate and Rhythm: Normal rate and regular rhythm.     Pulses: Normal pulses.     Heart sounds: Normal heart sounds.  Pulmonary:     Effort: Pulmonary effort is normal.     Breath sounds: Normal breath sounds.  Neurological:     Mental Status: She is alert.  Psychiatric:        Mood and Affect: Mood normal.        Behavior: Behavior normal.        Thought Content: Thought content normal.        Judgment: Judgment normal.       Assessment & Plan:  Hypertension associated with diabetes [facility]) Assessment & Plan: Patient presents today with well-controlled blood pressure. Patient in no acute distress and is well-appearing. Denies chest pain, shortness of breath, lower extremity edema, vision changes, headaches. Cardiovascular exam with heart regular rate and rhythm. Normal heart sounds, no murmurs present. No lower extremity edema present. Lungs clear to auscultation bilaterally. Patient is currently taking losartan 25mg  daily. No refills needed. Return to office sooner if blood pressure begins to increase greater than 130/80.    Type 2 diabetes mellitus without complication, with [place]-term current use of insulin (HCC) Assessment & Plan: Patient has a history of type 2 diabetes mellitus. Patient is followed by  [facility] and reports her most recent hemoglobin A1c is 5.9. Sent communication for records.    Steroid-induced myopathy Assessment & Plan: Based on history of diabetes and hypertension, BPA is showing that patient should be on statins to decrease ASCVD risk. Patient unable to tolerate statins.    Encounter for immunization -     Flu vaccine trivalent PF, 6mos and older(Flulaval,Afluria,Fluarix,Fluzone)     Return in about 6 months (around 09/25/2023) for HTN follow-up, Diabetes f/u.    Alyson Reedy, FNP

## 2023-03-28 NOTE — Assessment & Plan Note (Signed)
Patient presents today with well-controlled blood pressure. Patient in no acute distress and is well-appearing. Denies chest pain, shortness of breath, lower extremity edema, vision changes, headaches. Cardiovascular exam with heart regular rate and rhythm. Normal heart sounds, no murmurs present. No lower extremity edema present. Lungs clear to auscultation bilaterally. Patient is currently taking losartan 25mg  daily. No refills needed. Return to office sooner if blood pressure begins to increase greater than 130/80.

## 2023-03-28 NOTE — Assessment & Plan Note (Addendum)
Patient has a history of type 2 diabetes mellitus. Patient is followed by [facility] and reports her most recent hemoglobin A1c is 5.9. Sent communication for records.

## 2023-03-28 NOTE — Assessment & Plan Note (Signed)
Based on history of diabetes and hypertension, BPA is showing that patient should be on statins to decrease ASCVD risk. Patient unable to tolerate statins.

## 2023-03-31 ENCOUNTER — Encounter (HOSPITAL_BASED_OUTPATIENT_CLINIC_OR_DEPARTMENT_OTHER): Payer: Self-pay

## 2023-03-31 ENCOUNTER — Ambulatory Visit (HOSPITAL_BASED_OUTPATIENT_CLINIC_OR_DEPARTMENT_OTHER): Payer: Medicare Other

## 2023-03-31 DIAGNOSIS — M542 Cervicalgia: Secondary | ICD-10-CM

## 2023-03-31 DIAGNOSIS — M25612 Stiffness of left shoulder, not elsewhere classified: Secondary | ICD-10-CM

## 2023-03-31 DIAGNOSIS — M79602 Pain in left arm: Secondary | ICD-10-CM

## 2023-03-31 DIAGNOSIS — M7918 Myalgia, other site: Secondary | ICD-10-CM

## 2023-03-31 NOTE — Therapy (Signed)
OUTPATIENT PHYSICAL THERAPY CERVICAL TREATMENT   Patient Name: Megan Murray MRN: [id number] DOB:1966/05/04, 57 y.o., female Today's Date: 03/31/2023  END OF SESSION:  PT End of Session - 03/31/23 1151     Visit Number 2    Number of Visits 12    Date for PT Re-Evaluation 04/29/23    Authorization Type [employer] MCR    PT Start Time 1148    PT Stop Time 1230    PT Time Calculation (min) 42 min    Activity Tolerance Patient tolerated treatment well    Behavior During Therapy [facility] for tasks assessed/performed              Past Medical History:  Diagnosis Date   Anxiety    Asthma    B12 deficiency 01/23/2015   Contusion of left wrist 07/29/2015   Coronary artery disease moderate based on coronary CT angio of circumflex 04/29/2021   Essential hypertension 11/09/2016   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   Fatty liver 05/16/2018   By CT scan   Fatty liver    Fibromyalgia    Gastric bypass status for obesity 09/21/2022   Gastroesophageal reflux disease without esophagitis 04/26/2019   Hypertension associated with diabetes (HCC) 11/09/2016   Joint pain    Major depression, recurrent, chronic (HCC) 04/25/2018   Dr. Evelene Croon   Mixed hyperlipidemia 11/04/2016   Failed statins; hyperglycemia with repatha. Stopped 06/2018    Myofascial pain 11/12/2019   Neck pain 02/14/2023   Obesity due to excess calories without serious comorbidity    Osteoarthritis, multiple sites 04/25/2018   Pancreatic disease    Pneumonia    PONV (postoperative nausea and vomiting)    Primary insomnia 02/14/2023   Primary osteoarthritis of left knee 11/12/2019   Primary osteoarthritis of right knee 11/12/2019   Prolonged QT interval 09/27/2021   Seasonal allergic rhinitis due to pollen 05/19/2018   Statin intolerance 06/21/2018   Type 2 diabetes mellitus without complication, without [place]-term current use of insulin (HCC) 11/09/2016   Vitamin D deficiency    Past Surgical History:  Procedure  Laterality Date   ABDOMINAL HYSTERECTOMY     CESAREAN SECTION  1994   GALLBLADDER SURGERY  1995   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   Procedure: LAPAROSCOPIC ROUX-EN-Y GASTRIC BYPASS WITH UPPER ENDOSCOPY, LYSIS OF ADHESIONS X 30 MIN;  Surgeon: Gaynelle Adu, MD;  Location: [facility];  Service: General;  Laterality: N/A;   REDUCTION MAMMAPLASTY     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   Anxiety    Pneumonia    PONV (postoperative nausea and vomiting)    Neck pain 02/14/2023   Primary insomnia 02/14/2023   Gastric bypass status for obesity 09/21/2022   Asthma 05/05/2022   Joint pain 05/05/2022   Pancreatic disease 05/05/2022   Vitamin D deficiency 01/20/2022   Prolonged QT interval 09/27/2021   Coronary artery disease moderate based on coronary CT angio of circumflex 04/29/2021   Obesity due to excess calories without serious comorbidity 02/19/2021   Myofascial pain 11/12/2019   Primary osteoarthritis of left knee 11/12/2019   Primary osteoarthritis of right knee 11/12/2019   Gastroesophageal reflux disease without esophagitis 04/26/2019   Seasonal allergic rhinitis due to pollen 05/19/2018   Fatty liver 05/16/2018   Fibromyalgia 04/25/2018   Major depression, recurrent, chronic (HCC) 04/25/2018   Osteoarthritis, multiple sites 04/25/2018   Hypertension associated with diabetes (HCC) 11/09/2016   Type 2 diabetes mellitus without complication, without [place]-term  current use of insulin (HCC) 11/09/2016   Essential hypertension 11/09/2016   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   Mixed hyperlipidemia 11/04/2016   Contusion of left wrist 07/29/2015   B12 deficiency 01/23/2015     REFERRING PROVIDER: Alyson Reedy, FNP  REFERRING DIAG: M54.2 (ICD-10-CM) - Neck pain  THERAPY DIAG:  Cervicalgia  Pain in left arm  Myofascial pain  Stiffness of left shoulder, not elsewhere classified  M25.60  Stiffness of unspecified  joint, not elsewhere classified.  Rationale for Evaluation and Treatment: Rehabilitation  ONSET DATE: May 2024  SUBJECTIVE:                                                                                                                                                                                                         SUBJECTIVE STATEMENT: Pt reports 4-5/10 pain in L side of neck. No change in sx since IE.   EVAL: Pain began in May and she had no specific MOI.  Pt has been seeing chiropractor since June.  Pt had x rays and was informed she has a pinched nerve.  Pt states her shoulder mobility improved with chiropractor care though her pain did not improve.      PT order indicated Chronic neck pain radiating to left arm and left elbow- since June with no improvement with chiropractor.  Pt c/o's of constant pain.  Pt has difficulty donning shirt and bra.  Pt unable to vacuum and mop and has pain with household chores.   Pt performs aquatic classes 2x/wk and walks 3 miles 2-3x/wk at [employer].   Hand dominance: Right  PERTINENT HISTORY:  DM type 2, fibromyalgia, myofascial pain, HTN, depression, insomnia, OA including knees Gastric bypass 09/2022  PAIN:  NPRS:  4/10 current, 3/10 best, 9/10 worst Type:  constant, sharp, dull, and pinching.  Pt denies N/T Location:  L UT, L cervical, L clavicle.  Pain can travel down L UE to hand.   Alleviating factors:  walking, tylenol, ice, massage, dry needling Aggravating factors:  reaching laterally, prolonged sitting including in the car, lifting objects  PRECAUTIONS: Other: fibromyalgia  RED FLAGS: None     WEIGHT BEARING RESTRICTIONS: No  FALLS:  Has patient fallen in last 6 months? No  LIVING ENVIRONMENT: Lives with: lives with their spouse Lives in: one level home   OCCUPATION: Pt is on disability.   PLOF: Independent  PATIENT GOALS: to be pain free and move more freely.  OBJECTIVE:   DIAGNOSTIC FINDINGS:  Pt had x  rays at chiropractor office.  PT unable to view findings.  PATIENT SURVEYS:  NDI:  22  COGNITION: Overall cognitive status: Within functional limits for tasks assessed  SENSATION: 2+ sensation to LT t/o bilat UE dermatomes.     PALPATION: TTP in bilat UT. Pt has moderate soft tissue tightness in R UT and moderate (+) soft tissue tightness on L.   CERVICAL ROM:   Active ROM A/PROM (deg) eval  Flexion WFL  Extension 51  Right lateral flexion 19 with pain  Left lateral flexion 22  Right rotation 57  Left rotation 45   (Blank rows = not tested)  UPPER EXTREMITY ROM:  Active ROM Right eval Left eval  Shoulder flexion WFL Pain at 111 deg, 131 total AROM  Shoulder extension    Shoulder abduction 130 92 with pain  Shoulder adduction    Shoulder extension    Shoulder internal rotation    Shoulder external rotation    Elbow flexion    Elbow extension    Wrist flexion    Wrist extension    Wrist ulnar deviation    Wrist radial deviation    Wrist pronation    Wrist supination     (Blank rows = not tested)  Repeated Movements:   Cervical flexion:  no change in pain, felt a stretch in upper thoracic Cervical extension:  no change in pain, feels a little looser in L UT  UPPER EXTREMITY MMT:  MMT Right eval Left eval  Shoulder flexion 4/5 Tol min resistance  Shoulder extension    Shoulder abduction    Shoulder adduction    Shoulder extension    Shoulder internal rotation    Shoulder external rotation    Middle trapezius    Lower trapezius    Elbow flexion 5/5 5/5  Elbow extension 5/5 5/5  Wrist flexion    Wrist extension    Wrist ulnar deviation    Wrist radial deviation    Wrist pronation    Wrist supination    Grip strength     (Blank rows = not tested)  CERVICAL SPECIAL TESTS:  Compression Test:  positive for pain Spurling's Test: negative    TODAY'S TREATMENT:                                                                                                                                9/19: STM and TPR to L cervical mm and upper trap Cupping to L UT area and lower cervical ps Upper trap and levator stretching 30sec x3ea Instruction in theracane and tennis ball release techniques  PATIENT EDUCATION:  Education details: dx, objective findings, relevant anatomy, prognosis, HEP, POC, and rationale of interventions.  PT answered pt's questions.  Person educated: Patient Education method: Explanation, Demonstration, Tactile cues, Verbal cues, and Handouts Education comprehension: verbalized understanding, returned demonstration, verbal cues required, tactile cues required, and needs further education  HOME EXERCISE PROGRAM: Access Code: [email or web address] URL: [email or web address] Date: 03/18/2023 Prepared by: Aaron Edelman  Exercises - Seated  Upper Trapezius Stretch  - 2 x daily - 7 x weekly - 2-3 reps - 20 seconds hold - Seated Cervical Retraction  - 2 x daily - 7 x weekly - 2 sets - 10 reps  ASSESSMENT:  CLINICAL IMPRESSION: Pt demonstrates significant cervical, upper/mid thoracic, and upper trap restrictions. Performed manual techniques as well as cupping today. Pt had excellent response to these interventions, citing improved tightness and pain level. Educated pt on self release techniques using tennis balls and theracane that she can perform at home. Recommended use of heat for pain management.  Pt will likely benefit from DN which PT is in agreement with. Will attempt to set her up for this in upcoming visits.   OBJECTIVE IMPAIRMENTS: decreased activity tolerance, decreased ROM, decreased strength, hypomobility, increased fascial restrictions, increased muscle spasms, impaired flexibility, impaired UE functional use, and pain.   ACTIVITY LIMITATIONS: lifting, sitting, and dressing  PARTICIPATION LIMITATIONS: cleaning  PERSONAL FACTORS: 3+ comorbidities: DM type 2, fibromyalgia, myofascial pain, and OA  are also  affecting patient's functional outcome.   REHAB POTENTIAL: Good  CLINICAL DECISION MAKING: Stable/uncomplicated  EVALUATION COMPLEXITY: Low   GOALS:  SHORT TERM GOALS: Target date: 04/08/2023   Pt will be independent and compliant with HEP for improved pain, ROM, strength, and function.   Baseline:  Goal status: INITIAL  2.  Pt will report at least a 25% improvement in pain overall.  Baseline:  Goal status: INITIAL  3.  Pt will demo improved L shoulder AROM to 145-150 deg in flexion and 115 deg in abduction for improved reaching and shoulder mobility.  Baseline:  Goal status: INITIAL    Hubert TERM GOALS: Target date: 04/29/2023   Pt will demo improved cervical AROM to at least 65/60 deg in R/L rotation and 35 deg in bilat Sb'ing for improved stiffness and mobility with daily activities Baseline:  Goal status: INITIAL  2.  Pt will be able to dress without difficulty and without increased pain.  Baseline:  Goal status: INITIAL  3.  Pt will be able to perform her normal household chores without significant difficulty.  Baseline:  Goal status: INITIAL  4.  Pt will deny having pain distal to L shoulder and report her worst pain to be no > than 3/10. Baseline:  Goal status: INITIAL  5.  Pt will reports she is able to perform her normal reaching activities without significant pain and difficulty.  Baseline:  Goal status: INITIAL    PLAN:  PT FREQUENCY: 2x/week  PT DURATION: 6 weeks  PLANNED INTERVENTIONS: Therapeutic exercises, Therapeutic activity, Neuromuscular re-education, Patient/Family education, Self Care, Joint mobilization, Stair training, Aquatic Therapy, Dry Needling, Electrical stimulation, Spinal mobilization, Cryotherapy, Moist heat, Taping, Ultrasound, Manual therapy, and Re-evaluation  PLAN FOR NEXT SESSION: review and perform HEP.  Cervical ROM and flexibility, postural strengthening, and manual techniques including STM.   Riki Altes,  PTA  03/31/23 1:32 PM

## 2023-04-05 ENCOUNTER — Ambulatory Visit (HOSPITAL_BASED_OUTPATIENT_CLINIC_OR_DEPARTMENT_OTHER): Payer: Medicare Other

## 2023-04-06 ENCOUNTER — Encounter (HOSPITAL_BASED_OUTPATIENT_CLINIC_OR_DEPARTMENT_OTHER): Payer: Self-pay | Admitting: Family Medicine

## 2023-04-06 ENCOUNTER — Telehealth (HOSPITAL_BASED_OUTPATIENT_CLINIC_OR_DEPARTMENT_OTHER): Payer: Medicare Other | Admitting: Family Medicine

## 2023-04-06 VITALS — Ht 61.0 in | Wt 182.0 lb

## 2023-04-06 DIAGNOSIS — U071 COVID-19: Secondary | ICD-10-CM | POA: Diagnosis not present

## 2023-04-06 DIAGNOSIS — R051 Acute cough: Secondary | ICD-10-CM

## 2023-04-06 MED ORDER — PROMETHAZINE-DM 6.25-15 MG/5ML PO SYRP
5.0000 mL | ORAL_SOLUTION | Freq: Four times a day (QID) | ORAL | 0 refills | Status: DC | PRN
Start: 2023-04-06 — End: 2023-05-02

## 2023-04-06 MED ORDER — NIRMATRELVIR/RITONAVIR (PAXLOVID)TABLET: 3.0000 | ORAL_TABLET | Freq: Two times a day (BID) | ORAL | 0 refills | Status: AC

## 2023-04-06 NOTE — Progress Notes (Signed)
Virtual Visit via Video Note  I connected with Megan Murray on 04/06/23 at 8:37 AM by a video enabled telemedicine application and verified that I am speaking with the correct person using two identifiers.  Patient Location: Home Provider Location: Office/Clinic  I discussed the limitations, risks, security, and privacy concerns of performing an evaluation and management service by video and the availability of in person appointments. I also discussed with the patient that there may be a patient responsible charge related to this service. The patient expressed understanding and agreed to proceed.  Subjective: PCP: Alyson Reedy, FNP  Chief Complaint  Patient presents with   Covid Positive    Tested positive 09/24, coughing had a fever last night congestion body aches, clear mucus. Has used tylenol at home   Megan Murray is a 57 year-old female patient who presents today for concerns of fever, nasal congestion, body aches, and cough. Over the weekend, husband was not feeling great for low grade fever- went to UC and dx with ear infection and tested + for COVID. She started feeling congested yesterday. Started running a fever. Nasal congestion. Decreased energy, fatigue, cough/sneezing. Denies wheezing., shob, increased work of breathing.   ROS: Per HPI  Current Outpatient Medications:    Bempedoic Acid-Ezetimibe (NEXLIZET) 180-10 MG TABS, Take 1 tablet by mouth daily., Disp: 90 tablet, Rfl: 0   Continuous Blood Gluc Receiver (FREESTYLE LIBRE 2 READER) DEVI, Use as directed, Disp: 1 each, Rfl: 0   Continuous Glucose Sensor (FREESTYLE LIBRE 2 SENSOR) MISC, USE SENSOR AS DIRECTED TO TEST BLOOD SUGAR, Disp: 2 each, Rfl: 5   FLUoxetine (PROZAC) 40 MG capsule, Take 1 capsule (40 mg total) by mouth daily., Disp: 90 capsule, Rfl: 3   fluticasone-salmeterol (WIXELA INHUB) 250-50 MCG/ACT AEPB, Inhale 1 puff into the lungs 2 (two) times daily., Disp: 180 each, Rfl: 0   insulin lispro (HUMALOG  KWIKPEN) 100 UNIT/ML KwikPen, Inject 6 Units into the skin 3 (three) times daily., Disp: 15 mL, Rfl: 11   losartan (COZAAR) 25 MG tablet, Take 1 tablet (25 mg total) by mouth daily., Disp: 90 tablet, Rfl: 2   nirmatrelvir/ritonavir (PAXLOVID) 20 x 150 MG & 10 x 100MG  TABS, Take 3 tablets by mouth 2 (two) times daily for 5 days. (Take nirmatrelvir 150 mg two tablets twice daily for 5 days and ritonavir 100 mg one tablet twice daily for 5 days) Patient GFR is 111., Disp: 30 tablet, Rfl: 0   ONETOUCH ULTRA test strip, USE TO CHECK THREE TIMES DAILY, Disp: 100 strip, Rfl: 0   promethazine-dextromethorphan (PROMETHAZINE-DM) 6.25-15 MG/5ML syrup, Take 5 mLs by mouth 4 (four) times daily as needed for cough (Maximum dose: 30mL in 24 hours)., Disp: 118 mL, Rfl: 0   traMADol (ULTRAM) 50 MG tablet, 1 tab po bid prn pain, Disp: 30 tablet, Rfl: 0   TRESIBA FLEXTOUCH 200 UNIT/ML FlexTouch Pen, Inject 25 Units into the skin as directed. 25 units in the morning and 15 units at night, Disp: , Rfl:    zolpidem (AMBIEN) 10 MG tablet, TAKE 1 TABLET BY MOUTH EVERY NIGHT AT BEDTIME AS NEEDED FOR INSOMNIA, Disp: 30 tablet, Rfl: 0  Observations/Objective: Today's Vitals   04/06/23 0819  Weight: 182 lb (82.6 kg)  Height: 5\' 1"  (1.549 m)    General: Alert and oriented x 4. Speaking in clear and full sentences, no audible heavy breathing, no acute distress.  Sounds alert and appropriately interactive.  Appears well.  Face symmetric.  Extraocular movements  intact.  Pupils equal and round.  No nasal flaring or accessory muscle use visualized.  Assessment and Plan: 1. COVID-19 Patient presents today with concerns for positive home COVID-19 test. Discussed symptomatic treatment for nasal congestion, cough and fever. Advised patient to remain adequately hydrated. Discussed anti-viral medication- including benefits and risks. Patient verbalized understanding and would like to proceed with antiviral treatment. Review of kidney  function is normal. Prescription sent to pharmacy. Discussed when to seek emergency care.  - promethazine-dextromethorphan (PROMETHAZINE-DM) 6.25-15 MG/5ML syrup; Take 5 mLs by mouth 4 (four) times daily as needed for cough (Maximum dose: 30mL in 24 hours).  Dispense: 118 mL; Refill: 0 - nirmatrelvir/ritonavir (PAXLOVID) 20 x 150 MG & 10 x 100MG  TABS; Take 3 tablets by mouth 2 (two) times daily for 5 days. (Take nirmatrelvir 150 mg two tablets twice daily for 5 days and ritonavir 100 mg one tablet twice daily for 5 days) Patient GFR is 111.  Dispense: 30 tablet; Refill: 0  2. Acute cough Denies shortness of breath, wheezing, difficulty breathing, or increased work of breathing. Discussed when to seek urgent/emergency care. Prescribed patient cough medication to promote adequate sleep.  - promethazine-dextromethorphan (PROMETHAZINE-DM) 6.25-15 MG/5ML syrup; Take 5 mLs by mouth 4 (four) times daily as needed for cough (Maximum dose: 30mL in 24 hours).  Dispense: 118 mL; Refill: 0   Follow Up Instructions: Return if symptoms worsen or fail to improve.   I discussed the assessment and treatment plan with the patient. The patient was provided an opportunity to ask questions, and all were answered. The patient agreed with the plan and demonstrated an understanding of the instructions.   The patient was advised to call back or seek an in-person evaluation if the symptoms worsen or if the condition fails to improve as anticipated.  The above assessment and management plan was discussed with the patient. The patient verbalized understanding of and has agreed to the management plan.   Alyson Reedy, FNP

## 2023-04-08 ENCOUNTER — Encounter (HOSPITAL_BASED_OUTPATIENT_CLINIC_OR_DEPARTMENT_OTHER): Payer: Medicare Other | Admitting: Physical Therapy

## 2023-04-12 ENCOUNTER — Ambulatory Visit (HOSPITAL_BASED_OUTPATIENT_CLINIC_OR_DEPARTMENT_OTHER): Payer: Medicare Other | Attending: Family Medicine | Admitting: Physical Therapy

## 2023-04-12 ENCOUNTER — Encounter (HOSPITAL_BASED_OUTPATIENT_CLINIC_OR_DEPARTMENT_OTHER): Payer: Self-pay | Admitting: Physical Therapy

## 2023-04-12 DIAGNOSIS — M7918 Myalgia, other site: Secondary | ICD-10-CM | POA: Diagnosis present

## 2023-04-12 DIAGNOSIS — M25612 Stiffness of left shoulder, not elsewhere classified: Secondary | ICD-10-CM | POA: Diagnosis present

## 2023-04-12 DIAGNOSIS — M79602 Pain in left arm: Secondary | ICD-10-CM | POA: Insufficient documentation

## 2023-04-12 DIAGNOSIS — M542 Cervicalgia: Secondary | ICD-10-CM | POA: Diagnosis present

## 2023-04-12 NOTE — Therapy (Signed)
OUTPATIENT PHYSICAL THERAPY CERVICAL TREATMENT   Patient Name: Megan Murray MRN: [id number] DOB:07-13-1965, 57 y.o., female Today's Date: 04/13/2023  END OF SESSION:   PT End of Session - 04/12/23      Visit Number 3     Number of Visits 12     Date for PT Re-Evaluation 04/29/23     Authorization Type U[employer] MCR     PT Start Time 1151     PT Stop Time 1235     PT Time Calculation (min) 44 min     Activity Tolerance Patient tolerated treatment well     Behavior During Therapy [facility] for tasks assessed/performed       Past Medical History:  Diagnosis Date   Anxiety    Asthma    B12 deficiency 01/23/2015   Contusion of left wrist 07/29/2015   Coronary artery disease moderate based on coronary CT angio of circumflex 04/29/2021   Essential hypertension 11/09/2016   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   Fatty liver 05/16/2018   By CT scan   Fatty liver    Fibromyalgia    Gastric bypass status for obesity 09/21/2022   Gastroesophageal reflux disease without esophagitis 04/26/2019   Hypertension associated with diabetes (HCC) 11/09/2016   Joint pain    Major depression, recurrent, chronic (HCC) 04/25/2018   Dr. Evelene Croon   Mixed hyperlipidemia 11/04/2016   Failed statins; hyperglycemia with repatha. Stopped 06/2018    Myofascial pain 11/12/2019   Neck pain 02/14/2023   Obesity due to excess calories without serious comorbidity    Osteoarthritis, multiple sites 04/25/2018   Pancreatic disease    Pneumonia    PONV (postoperative nausea and vomiting)    Primary insomnia 02/14/2023   Primary osteoarthritis of left knee 11/12/2019   Primary osteoarthritis of right knee 11/12/2019   Prolonged QT interval 09/27/2021   Seasonal allergic rhinitis due to pollen 05/19/2018   Statin intolerance 06/21/2018   Type 2 diabetes mellitus without complication, without [place]-term current use of insulin (HCC) 11/09/2016   Vitamin D deficiency    Past Surgical History:  Procedure Laterality  Date   ABDOMINAL HYSTERECTOMY     CESAREAN SECTION  1994   GALLBLADDER SURGERY  1995   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   Procedure: LAPAROSCOPIC ROUX-EN-Y GASTRIC BYPASS WITH UPPER ENDOSCOPY, LYSIS OF ADHESIONS X 30 MIN;  Surgeon: Gaynelle Adu, MD;  Location: [facility];  Service: General;  Laterality: N/A;   REDUCTION MAMMAPLASTY     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   Anxiety    Pneumonia    PONV (postoperative nausea and vomiting)    Neck pain 02/14/2023   Primary insomnia 02/14/2023   Gastric bypass status for obesity 09/21/2022   Asthma 05/05/2022   Joint pain 05/05/2022   Pancreatic disease 05/05/2022   Vitamin D deficiency 01/20/2022   Prolonged QT interval 09/27/2021   Coronary artery disease moderate based on coronary CT angio of circumflex 04/29/2021   Obesity due to excess calories without serious comorbidity 02/19/2021   Myofascial pain 11/12/2019   Primary osteoarthritis of left knee 11/12/2019   Primary osteoarthritis of right knee 11/12/2019   Gastroesophageal reflux disease without esophagitis 04/26/2019   Seasonal allergic rhinitis due to pollen 05/19/2018   Fatty liver 05/16/2018   Fibromyalgia 04/25/2018   Major depression, recurrent, chronic (HCC) 04/25/2018   Osteoarthritis, multiple sites 04/25/2018   Hypertension associated with diabetes (HCC) 11/09/2016   Type 2 diabetes mellitus without complication,  without [place]-term current use of insulin (HCC) 11/09/2016   Essential hypertension 11/09/2016   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   Mixed hyperlipidemia 11/04/2016   Contusion of left wrist 07/29/2015   B12 deficiency 01/23/2015     REFERRING PROVIDER: Alyson Reedy, FNP  REFERRING DIAG: M54.2 (ICD-10-CM) - Neck pain  THERAPY DIAG:  Cervicalgia  Stiffness of left shoulder, not elsewhere classified  Pain in left arm  M25.60  Stiffness of unspecified joint, not elsewhere  classified.  Rationale for Evaluation and Treatment: Rehabilitation  ONSET DATE: May 2024  SUBJECTIVE:                                                                                                                                                                                                         SUBJECTIVE STATEMENT: Pt reports she had to cancel her last 2 PT appt's due to having covid.  Pt saw a big difference after receiving cupping after prior Rx.  Pt reports improved pain and mobility after Rx.  Pt states the tightness has been returning recently.  Pt states the pain in her arm was worse than her neck yesterday.  Pain was traveling down elbow.  Pt has used a theracane and tennis ball some for soft tissue mobility.  Pt has been performing HEP though would have done it more if she would not have been sick.    Hand dominance: Right  PERTINENT HISTORY:  DM type 2, fibromyalgia, myofascial pain, HTN, depression, insomnia, OA including knees Gastric bypass 09/2022  PAIN:  NPRS:  2/10 current, 3/10 best, 9/10 worst Type:  constant, sharp, dull, and pinching.  Pt denies N/T Location:  L UT, L cervical.  Pain can travel down L UE to hand.   Alleviating factors:  walking, tylenol, ice, massage, dry needling Aggravating factors:  reaching laterally, prolonged sitting including in the car, lifting objects  PRECAUTIONS: Other: fibromyalgia  RED FLAGS: None     WEIGHT BEARING RESTRICTIONS: No  FALLS:  Has patient fallen in last 6 months? No  LIVING ENVIRONMENT: Lives with: lives with their spouse Lives in: one level home   OCCUPATION: Pt is on disability.   PLOF: Independent  PATIENT GOALS: to be pain free and move more freely.  OBJECTIVE:   DIAGNOSTIC FINDINGS:  Pt had x rays at chiropractor office.  PT unable to view findings.    PATIENT SURVEYS:  NDI:  22  COGNITION: Overall cognitive status: Within functional limits for tasks assessed  SENSATION: 2+  sensation to LT t/o bilat UE dermatomes.     PALPATION:  TTP in bilat UT. Pt has moderate soft tissue tightness in R UT and moderate (+) soft tissue tightness on L.   CERVICAL ROM:   Active ROM A/PROM (deg) eval  Flexion WFL  Extension 51  Right lateral flexion 19 with pain  Left lateral flexion 22  Right rotation 57  Left rotation 45   (Blank rows = not tested)  UPPER EXTREMITY ROM:  Active ROM Right eval Left eval  Shoulder flexion WFL Pain at 111 deg, 131 total AROM  Shoulder extension    Shoulder abduction 130 92 with pain  Shoulder adduction    Shoulder extension    Shoulder internal rotation    Shoulder external rotation    Elbow flexion    Elbow extension    Wrist flexion    Wrist extension    Wrist ulnar deviation    Wrist radial deviation    Wrist pronation    Wrist supination     (Blank rows = not tested)  Repeated Movements:   Cervical flexion:  no change in pain, felt a stretch in upper thoracic Cervical extension:  no change in pain, feels a little looser in L UT  UPPER EXTREMITY MMT:  MMT Right eval Left eval  Shoulder flexion 4/5 Tol min resistance  Shoulder extension    Shoulder abduction    Shoulder adduction    Shoulder extension    Shoulder internal rotation    Shoulder external rotation    Middle trapezius    Lower trapezius    Elbow flexion 5/5 5/5  Elbow extension 5/5 5/5  Wrist flexion    Wrist extension    Wrist ulnar deviation    Wrist radial deviation    Wrist pronation    Wrist supination    Grip strength     (Blank rows = not tested)  CERVICAL SPECIAL TESTS:  Compression Test:  positive for pain Spurling's Test: negative    TODAY'S TREATMENT:                                                                                                                                 Reviewed pt presentation, pain levels, HEP compliance, and response to prior Rx.  Manual Therapy: (to improve soft tissue tightness and mobility  and to reduce myofascial adhesions and restrictions) Pt received STM and TPR to bilat UT seated Static Cupping to L UT and cervical paraspinals seated  Therapeutic Exercise: Seated UT and LS stretch 3x30 sec bilat and 2x30 sec bilat respectively Seated chin tucks 2x10 Rows with retraction with RTB 2x10 Shoulder extension with retraction 2x10 with RTB      PATIENT EDUCATION:  Education details: dx, response to cupping, relevant anatomy, prognosis, HEP, POC, and rationale of interventions.  PT answered pt's questions.  Person educated: Patient Education method: Explanation, Demonstration, Tactile cues, Verbal cues Education comprehension: verbalized understanding, returned demonstration, verbal cues required, tactile cues required, and needs further education  HOME EXERCISE PROGRAM: Access Code: [email or web address] URL: [email or web address] Date: 03/18/2023 Prepared by: Aaron Edelman  Exercises - Seated Upper Trapezius Stretch  - 2 x daily - 7 x weekly - 2-3 reps - 20 seconds hold - Seated Cervical Retraction  - 2 x daily - 7 x weekly - 2 sets - 10 reps  ASSESSMENT:  CLINICAL IMPRESSION: Pt has been absent from PT due to being sick with Covid.  She reports having a good response from cupping last Rx.  Pt has been performing home exercises and STW though states she probably would have done it more if she didn't get sick.  PT performed STW including cupping to UT and cervical paraspinals.  Pt responded well to MT stating she had improved neck mobility immediately after MT.  Pt had an appropriate response to cupping.  Pt required cuing and instruction in correct form with exercises including cervical stretches.  Pt responded well to Rx stating her pain was less and she had improved stiffness and mobility after Rx.  OBJECTIVE IMPAIRMENTS: decreased activity tolerance, decreased ROM, decreased strength, hypomobility, increased fascial restrictions, increased muscle spasms, impaired  flexibility, impaired UE functional use, and pain.   ACTIVITY LIMITATIONS: lifting, sitting, and dressing  PARTICIPATION LIMITATIONS: cleaning  PERSONAL FACTORS: 3+ comorbidities: DM type 2, fibromyalgia, myofascial pain, and OA  are also affecting patient's functional outcome.   REHAB POTENTIAL: Good  CLINICAL DECISION MAKING: Stable/uncomplicated  EVALUATION COMPLEXITY: Low   GOALS:  SHORT TERM GOALS: Target date: 04/08/2023   Pt will be independent and compliant with HEP for improved pain, ROM, strength, and function.   Baseline:  Goal status: INITIAL  2.  Pt will report at least a 25% improvement in pain overall.  Baseline:  Goal status: INITIAL  3.  Pt will demo improved L shoulder AROM to 145-150 deg in flexion and 115 deg in abduction for improved reaching and shoulder mobility.  Baseline:  Goal status: INITIAL    [place] TERM GOALS: Target date: 04/29/2023   Pt will demo improved cervical AROM to at least 65/60 deg in R/L rotation and 35 deg in bilat Sb'ing for improved stiffness and mobility with daily activities Baseline:  Goal status: INITIAL  2.  Pt will be able to dress without difficulty and without increased pain.  Baseline:  Goal status: INITIAL  3.  Pt will be able to perform her normal household chores without significant difficulty.  Baseline:  Goal status: INITIAL  4.  Pt will deny having pain distal to L shoulder and report her worst pain to be no > than 3/10. Baseline:  Goal status: INITIAL  5.  Pt will reports she is able to perform her normal reaching activities without significant pain and difficulty.  Baseline:  Goal status: INITIAL    PLAN:  PT FREQUENCY: 2x/week  PT DURATION: 6 weeks  PLANNED INTERVENTIONS: Therapeutic exercises, Therapeutic activity, Neuromuscular re-education, Patient/Family education, Self Care, Joint mobilization, Stair training, Aquatic Therapy, Dry Needling, Electrical stimulation, Spinal mobilization,  Cryotherapy, Moist heat, Taping, Ultrasound, Manual therapy, and Re-evaluation  PLAN FOR NEXT SESSION: review and perform HEP.  Cervical ROM and flexibility, postural strengthening, and manual techniques including STM and cupping.   Audie Clear III PT, DPT 04/13/23 8:53 PM

## 2023-04-14 ENCOUNTER — Encounter (HOSPITAL_BASED_OUTPATIENT_CLINIC_OR_DEPARTMENT_OTHER): Payer: Self-pay

## 2023-04-14 ENCOUNTER — Ambulatory Visit (HOSPITAL_BASED_OUTPATIENT_CLINIC_OR_DEPARTMENT_OTHER): Payer: Medicare Other

## 2023-04-14 DIAGNOSIS — M542 Cervicalgia: Secondary | ICD-10-CM | POA: Diagnosis not present

## 2023-04-14 DIAGNOSIS — M79602 Pain in left arm: Secondary | ICD-10-CM

## 2023-04-14 DIAGNOSIS — M25612 Stiffness of left shoulder, not elsewhere classified: Secondary | ICD-10-CM

## 2023-04-14 DIAGNOSIS — M7918 Myalgia, other site: Secondary | ICD-10-CM

## 2023-04-14 NOTE — Therapy (Signed)
OUTPATIENT PHYSICAL THERAPY CERVICAL TREATMENT   Patient Name: Megan Murray MRN: [id number] DOB:1966-02-22, 57 y.o., female Today's Date: 04/14/2023  END OF SESSION:  PT End of Session - 04/14/23 1417     Visit Number 4    Number of Visits 12    Date for PT Re-Evaluation 04/29/23    Authorization Type UHC MCR    PT Start Time 1150    PT Stop Time 1230    PT Time Calculation (min) 40 min    Activity Tolerance Patient tolerated treatment well    Behavior During Therapy [facility] for tasks assessed/performed             PT End of Session - 04/12/23      Visit Number 3     Number of Visits 12     Date for PT Re-Evaluation 04/29/23     Authorization Type UHC MCR     PT Start Time 1151     PT Stop Time 1235     PT Time Calculation (min) 44 min     Activity Tolerance Patient tolerated treatment well     Behavior During Therapy [facility] for tasks assessed/performed       Past Medical History:  Diagnosis Date   Anxiety    Asthma    B12 deficiency 01/23/2015   Contusion of left wrist 07/29/2015   Coronary artery disease moderate based on coronary CT angio of circumflex 04/29/2021   Essential hypertension 11/09/2016   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   Fatty liver 05/16/2018   By CT scan   Fatty liver    Fibromyalgia    Gastric bypass status for obesity 09/21/2022   Gastroesophageal reflux disease without esophagitis 04/26/2019   Hypertension associated with diabetes (HCC) 11/09/2016   Joint pain    Major depression, recurrent, chronic (HCC) 04/25/2018   Dr. Evelene Croon   Mixed hyperlipidemia 11/04/2016   Failed statins; hyperglycemia with repatha. Stopped 06/2018    Myofascial pain 11/12/2019   Neck pain 02/14/2023   Obesity due to excess calories without serious comorbidity    Osteoarthritis, multiple sites 04/25/2018   Pancreatic disease    Pneumonia    PONV (postoperative nausea and vomiting)    Primary insomnia 02/14/2023   Primary osteoarthritis of left knee  11/12/2019   Primary osteoarthritis of right knee 11/12/2019   Prolonged QT interval 09/27/2021   Seasonal allergic rhinitis due to pollen 05/19/2018   Statin intolerance 06/21/2018   Type 2 diabetes mellitus without complication, without [place]-term current use of insulin (HCC) 11/09/2016   Vitamin D deficiency    Past Surgical History:  Procedure Laterality Date   ABDOMINAL HYSTERECTOMY     CESAREAN SECTION  1994   GALLBLADDER SURGERY  1995   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   Procedure: LAPAROSCOPIC ROUX-EN-Y GASTRIC BYPASS WITH UPPER ENDOSCOPY, LYSIS OF ADHESIONS X 30 MIN;  Surgeon: Gaynelle Adu, MD;  Location: [facility];  Service: General;  Laterality: N/A;   REDUCTION MAMMAPLASTY     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   Anxiety    Pneumonia    PONV (postoperative nausea and vomiting)    Neck pain 02/14/2023   Primary insomnia 02/14/2023   Gastric bypass status for obesity 09/21/2022   Asthma 05/05/2022   Joint pain 05/05/2022   Pancreatic disease 05/05/2022   Vitamin D deficiency 01/20/2022   Prolonged QT interval 09/27/2021   Coronary artery disease moderate based on coronary CT angio of circumflex  04/29/2021   Obesity due to excess calories without serious comorbidity 02/19/2021   Myofascial pain 11/12/2019   Primary osteoarthritis of left knee 11/12/2019   Primary osteoarthritis of right knee 11/12/2019   Gastroesophageal reflux disease without esophagitis 04/26/2019   Seasonal allergic rhinitis due to pollen 05/19/2018   Fatty liver 05/16/2018   Fibromyalgia 04/25/2018   Major depression, recurrent, chronic (HCC) 04/25/2018   Osteoarthritis, multiple sites 04/25/2018   Hypertension associated with diabetes (HCC) 11/09/2016   Type 2 diabetes mellitus without complication, without [place]-term current use of insulin (HCC) 11/09/2016   Essential hypertension 11/09/2016   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   Mixed  hyperlipidemia 11/04/2016   Contusion of left wrist 07/29/2015   B12 deficiency 01/23/2015     REFERRING PROVIDER: Alyson Reedy, FNP  REFERRING DIAG: M54.2 (ICD-10-CM) - Neck pain  THERAPY DIAG:  Cervicalgia  Stiffness of left shoulder, not elsewhere classified  Pain in left arm  Myofascial pain  M25.60  Stiffness of unspecified joint, not elsewhere classified.  Rationale for Evaluation and Treatment: Rehabilitation  ONSET DATE: May 2024  SUBJECTIVE:                                                                                                                                                                                                         SUBJECTIVE STATEMENT: Pt denies pain into L hand since recovering from covid    Hand dominance: Right  PERTINENT HISTORY:  DM type 2, fibromyalgia, myofascial pain, HTN, depression, insomnia, OA including knees Gastric bypass 09/2022  PAIN:  NPRS:  2/10 current, 3/10 best, 9/10 worst Type:  constant, sharp, dull, and pinching.  Pt denies N/T Location:  L UT, L cervical.  Pain can travel down L UE to hand.   Alleviating factors:  walking, tylenol, ice, massage, dry needling Aggravating factors:  reaching laterally, prolonged sitting including in the car, lifting objects  PRECAUTIONS: Other: fibromyalgia  RED FLAGS: None     WEIGHT BEARING RESTRICTIONS: No  FALLS:  Has patient fallen in last 6 months? No  LIVING ENVIRONMENT: Lives with: lives with their spouse Lives in: one level home   OCCUPATION: Pt is on disability.   PLOF: Independent  PATIENT GOALS: to be pain free and move more freely.  OBJECTIVE:   DIAGNOSTIC FINDINGS:  Pt had x rays at chiropractor office.  PT unable to view findings.    PATIENT SURVEYS:  NDI:  22  COGNITION: Overall cognitive status: Within functional limits for tasks assessed  SENSATION: 2+ sensation to LT t/o bilat UE dermatomes.  PALPATION: TTP in bilat  UT. Pt has moderate soft tissue tightness in R UT and moderate (+) soft tissue tightness on L.   CERVICAL ROM:   Active ROM A/PROM (deg) eval  Flexion WFL  Extension 51  Right lateral flexion 19 with pain  Left lateral flexion 22  Right rotation 57  Left rotation 45   (Blank rows = not tested)  UPPER EXTREMITY ROM:  Active ROM Right eval Left eval  Shoulder flexion WFL Pain at 111 deg, 131 total AROM  Shoulder extension    Shoulder abduction 130 92 with pain  Shoulder adduction    Shoulder extension    Shoulder internal rotation    Shoulder external rotation    Elbow flexion    Elbow extension    Wrist flexion    Wrist extension    Wrist ulnar deviation    Wrist radial deviation    Wrist pronation    Wrist supination     (Blank rows = not tested)  Repeated Movements:   Cervical flexion:  no change in pain, felt a stretch in upper thoracic Cervical extension:  no change in pain, feels a little looser in L UT  UPPER EXTREMITY MMT:  MMT Right eval Left eval  Shoulder flexion 4/5 Tol min resistance  Shoulder extension    Shoulder abduction    Shoulder adduction    Shoulder extension    Shoulder internal rotation    Shoulder external rotation    Middle trapezius    Lower trapezius    Elbow flexion 5/5 5/5  Elbow extension 5/5 5/5  Wrist flexion    Wrist extension    Wrist ulnar deviation    Wrist radial deviation    Wrist pronation    Wrist supination    Grip strength     (Blank rows = not tested)  CERVICAL SPECIAL TESTS:  Compression Test:  positive for pain Spurling's Test: negative    TODAY'S TREATMENT:                                                                                                                                  Manual Therapy: (to improve soft tissue tightness and mobility and to reduce myofascial adhesions and restrictions) Pt received STM and TPR to bilat UT/cervical ps and suboccipitals. Static Cupping to bil UT and  cervical paraspinals seated  Therapeutic Exercise: Seated UT and LS stretch 2x30 sec bilat SCM stretch        PATIENT EDUCATION:  Education details: dx, response to cupping, relevant anatomy, prognosis, HEP, POC, and rationale of interventions.  PT answered pt's questions.  Person educated: Patient Education method: Explanation, Demonstration, Tactile cues, Verbal cues Education comprehension: verbalized understanding, returned demonstration, verbal cues required, tactile cues required, and needs further education  HOME EXERCISE PROGRAM: Access Code: [email or web address] URL: [email or web address] Date: 03/18/2023 Prepared by: Aaron Edelman  Exercises - Seated Upper Trapezius Stretch  - 2 x daily -  7 x weekly - 2-3 reps - 20 seconds hold - Seated Cervical Retraction  - 2 x daily - 7 x weekly - 2 sets - 10 reps  ASSESSMENT:  CLINICAL IMPRESSION: Continued with manual interventions as pt reports benefit from STM and cupping. Significant tightness in L sided cervical paraspinals which limited L cervical rotation. Spent time on release of these with mild improvement in pan with rotation. Instructed pt in SCM stretch to aid with this as well.   OBJECTIVE IMPAIRMENTS: decreased activity tolerance, decreased ROM, decreased strength, hypomobility, increased fascial restrictions, increased muscle spasms, impaired flexibility, impaired UE functional use, and pain.   ACTIVITY LIMITATIONS: lifting, sitting, and dressing  PARTICIPATION LIMITATIONS: cleaning  PERSONAL FACTORS: 3+ comorbidities: DM type 2, fibromyalgia, myofascial pain, and OA  are also affecting patient's functional outcome.   REHAB POTENTIAL: Good  CLINICAL DECISION MAKING: Stable/uncomplicated  EVALUATION COMPLEXITY: Low   GOALS:  SHORT TERM GOALS: Target date: 04/08/2023   Pt will be independent and compliant with HEP for improved pain, ROM, strength, and function.   Baseline:  Goal status: INITIAL  2.   Pt will report at least a 25% improvement in pain overall.  Baseline:  Goal status: INITIAL  3.  Pt will demo improved L shoulder AROM to 145-150 deg in flexion and 115 deg in abduction for improved reaching and shoulder mobility.  Baseline:  Goal status: INITIAL    [place] TERM GOALS: Target date: 04/29/2023   Pt will demo improved cervical AROM to at least 65/60 deg in R/L rotation and 35 deg in bilat Sb'ing for improved stiffness and mobility with daily activities Baseline:  Goal status: INITIAL  2.  Pt will be able to dress without difficulty and without increased pain.  Baseline:  Goal status: INITIAL  3.  Pt will be able to perform her normal household chores without significant difficulty.  Baseline:  Goal status: INITIAL  4.  Pt will deny having pain distal to L shoulder and report her worst pain to be no > than 3/10. Baseline:  Goal status: INITIAL  5.  Pt will reports she is able to perform her normal reaching activities without significant pain and difficulty.  Baseline:  Goal status: INITIAL    PLAN:  PT FREQUENCY: 2x/week  PT DURATION: 6 weeks  PLANNED INTERVENTIONS: Therapeutic exercises, Therapeutic activity, Neuromuscular re-education, Patient/Family education, Self Care, Joint mobilization, Stair training, Aquatic Therapy, Dry Needling, Electrical stimulation, Spinal mobilization, Cryotherapy, Moist heat, Taping, Ultrasound, Manual therapy, and Re-evaluation  PLAN FOR NEXT SESSION: review and perform HEP.  Cervical ROM and flexibility, postural strengthening, and manual techniques including STM and cupping.   Riki Altes, PTA  04/14/23 2:25 PM

## 2023-04-19 ENCOUNTER — Encounter (HOSPITAL_BASED_OUTPATIENT_CLINIC_OR_DEPARTMENT_OTHER): Payer: Self-pay

## 2023-04-19 ENCOUNTER — Encounter: Payer: Self-pay | Admitting: Dietician

## 2023-04-19 ENCOUNTER — Encounter: Payer: Medicare Other | Attending: General Surgery | Admitting: Dietician

## 2023-04-19 ENCOUNTER — Ambulatory Visit (HOSPITAL_BASED_OUTPATIENT_CLINIC_OR_DEPARTMENT_OTHER): Payer: Medicare Other

## 2023-04-19 VITALS — Ht 61.0 in | Wt 182.6 lb

## 2023-04-19 DIAGNOSIS — E669 Obesity, unspecified: Secondary | ICD-10-CM | POA: Diagnosis present

## 2023-04-19 DIAGNOSIS — M542 Cervicalgia: Secondary | ICD-10-CM

## 2023-04-19 DIAGNOSIS — M7918 Myalgia, other site: Secondary | ICD-10-CM

## 2023-04-19 DIAGNOSIS — M25612 Stiffness of left shoulder, not elsewhere classified: Secondary | ICD-10-CM

## 2023-04-19 DIAGNOSIS — M79602 Pain in left arm: Secondary | ICD-10-CM

## 2023-04-19 DIAGNOSIS — E119 Type 2 diabetes mellitus without complications: Secondary | ICD-10-CM | POA: Insufficient documentation

## 2023-04-19 NOTE — Therapy (Signed)
OUTPATIENT PHYSICAL THERAPY CERVICAL TREATMENT   Patient Name: Megan Murray MRN: [id number] DOB:1966/01/02, 57 y.o., female Today's Date: 04/19/2023  END OF SESSION:  PT End of Session - 04/19/23 1108     Visit Number 5    Number of Visits 12    Date for PT Re-Evaluation 04/29/23    Authorization Type [employer] MCR    PT Start Time 1108    PT Stop Time 1147    PT Time Calculation (min) 39 min    Activity Tolerance Patient tolerated treatment well    Behavior During Therapy [facility] for tasks assessed/performed              PT End of Session - 04/12/23      Visit Number 3     Number of Visits 12     Date for PT Re-Evaluation 04/29/23     Authorization Type [employer] MCR     PT Start Time 1151     PT Stop Time 1235     PT Time Calculation (min) 44 min     Activity Tolerance Patient tolerated treatment well     Behavior During Therapy WFL for tasks assessed/performed       Past Medical History:  Diagnosis Date   Anxiety    Asthma    B12 deficiency 01/23/2015   Contusion of left wrist 07/29/2015   Coronary artery disease moderate based on coronary CT angio of circumflex 04/29/2021   Essential hypertension 11/09/2016   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   Fatty liver 05/16/2018   By CT scan   Fatty liver    Fibromyalgia    Gastric bypass status for obesity 09/21/2022   Gastroesophageal reflux disease without esophagitis 04/26/2019   Hypertension associated with diabetes (HCC) 11/09/2016   Joint pain    Major depression, recurrent, chronic (HCC) 04/25/2018   Dr. Evelene Croon   Mixed hyperlipidemia 11/04/2016   Failed statins; hyperglycemia with repatha. Stopped 06/2018    Myofascial pain 11/12/2019   Neck pain 02/14/2023   Obesity due to excess calories without serious comorbidity    Osteoarthritis, multiple sites 04/25/2018   Pancreatic disease    Pneumonia    PONV (postoperative nausea and vomiting)    Primary insomnia 02/14/2023   Primary osteoarthritis of left knee  11/12/2019   Primary osteoarthritis of right knee 11/12/2019   Prolonged QT interval 09/27/2021   Seasonal allergic rhinitis due to pollen 05/19/2018   Statin intolerance 06/21/2018   Type 2 diabetes mellitus without complication, without [place]-term current use of insulin (HCC) 11/09/2016   Vitamin D deficiency    Past Surgical History:  Procedure Laterality Date   ABDOMINAL HYSTERECTOMY     CESAREAN SECTION  1994   GALLBLADDER SURGERY  1995   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   Procedure: LAPAROSCOPIC ROUX-EN-Y GASTRIC BYPASS WITH UPPER ENDOSCOPY, LYSIS OF ADHESIONS X 30 MIN;  Surgeon: Gaynelle Adu, MD;  Location: [facility];  Service: General;  Laterality: N/A;   REDUCTION MAMMAPLASTY     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   Anxiety    Pneumonia    PONV (postoperative nausea and vomiting)    Neck pain 02/14/2023   Primary insomnia 02/14/2023   Gastric bypass status for obesity 09/21/2022   Asthma 05/05/2022   Joint pain 05/05/2022   Pancreatic disease 05/05/2022   Vitamin D deficiency 01/20/2022   Prolonged QT interval 09/27/2021   Coronary artery disease moderate based on coronary CT angio of  circumflex 04/29/2021   Obesity due to excess calories without serious comorbidity 02/19/2021   Myofascial pain 11/12/2019   Primary osteoarthritis of left knee 11/12/2019   Primary osteoarthritis of right knee 11/12/2019   Gastroesophageal reflux disease without esophagitis 04/26/2019   Seasonal allergic rhinitis due to pollen 05/19/2018   Fatty liver 05/16/2018   Fibromyalgia 04/25/2018   Major depression, recurrent, chronic (HCC) 04/25/2018   Osteoarthritis, multiple sites 04/25/2018   Hypertension associated with diabetes (HCC) 11/09/2016   Type 2 diabetes mellitus without complication, without [place]-term current use of insulin (HCC) 11/09/2016   Essential hypertension 11/09/2016   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   Mixed  hyperlipidemia 11/04/2016   Contusion of left wrist 07/29/2015   B12 deficiency 01/23/2015     REFERRING PROVIDER: Alyson Reedy, FNP  REFERRING DIAG: M54.2 (ICD-10-CM) - Neck pain  THERAPY DIAG:  Cervicalgia  Stiffness of left shoulder, not elsewhere classified  Myofascial pain  Pain in left arm  M25.60  Stiffness of unspecified joint, not elsewhere classified.  Rationale for Evaluation and Treatment: Rehabilitation  ONSET DATE: May 2024  SUBJECTIVE:                                                                                                                                                                                                         SUBJECTIVE STATEMENT: Soreness after last visit, but not as bad as after the one before. No improvements  in pain level as of yet. Some tightness in pec areas today.   Hand dominance: Right  PERTINENT HISTORY:  DM type 2, fibromyalgia, myofascial pain, HTN, depression, insomnia, OA including knees Gastric bypass 09/2022  PAIN:  NPRS:  2/10 current, 3/10 best, 9/10 worst Type:  constant, sharp, dull, and pinching.  Pt denies N/T Location:  L UT, L cervical.  Pain can travel down L UE to hand.   Alleviating factors:  walking, tylenol, ice, massage, dry needling Aggravating factors:  reaching laterally, prolonged sitting including in the car, lifting objects  PRECAUTIONS: Other: fibromyalgia  RED FLAGS: None     WEIGHT BEARING RESTRICTIONS: No  FALLS:  Has patient fallen in last 6 months? No  LIVING ENVIRONMENT: Lives with: lives with their spouse Lives in: one level home   OCCUPATION: Pt is on disability.   PLOF: Independent  PATIENT GOALS: to be pain free and move more freely.  OBJECTIVE:   DIAGNOSTIC FINDINGS:  Pt had x rays at chiropractor office.  PT unable to view findings.    PATIENT SURVEYS:  NDI:  22  COGNITION: Overall cognitive status:  Within functional limits for tasks  assessed  SENSATION: 2+ sensation to LT t/o bilat UE dermatomes.     PALPATION: TTP in bilat UT. Pt has moderate soft tissue tightness in R UT and moderate (+) soft tissue tightness on L.   CERVICAL ROM:   Active ROM A/PROM (deg) eval  Flexion WFL  Extension 51  Right lateral flexion 19 with pain  Left lateral flexion 22  Right rotation 57  Left rotation 45   (Blank rows = not tested)  UPPER EXTREMITY ROM:  Active ROM Right eval Left eval  Shoulder flexion WFL Pain at 111 deg, 131 total AROM  Shoulder extension    Shoulder abduction 130 92 with pain  Shoulder adduction    Shoulder extension    Shoulder internal rotation    Shoulder external rotation    Elbow flexion    Elbow extension    Wrist flexion    Wrist extension    Wrist ulnar deviation    Wrist radial deviation    Wrist pronation    Wrist supination     (Blank rows = not tested)  Repeated Movements:   Cervical flexion:  no change in pain, felt a stretch in upper thoracic Cervical extension:  no change in pain, feels a little looser in L UT  UPPER EXTREMITY MMT:  MMT Right eval Left eval  Shoulder flexion 4/5 Tol min resistance  Shoulder extension    Shoulder abduction    Shoulder adduction    Shoulder extension    Shoulder internal rotation    Shoulder external rotation    Middle trapezius    Lower trapezius    Elbow flexion 5/5 5/5  Elbow extension 5/5 5/5  Wrist flexion    Wrist extension    Wrist ulnar deviation    Wrist radial deviation    Wrist pronation    Wrist supination    Grip strength     (Blank rows = not tested)  CERVICAL SPECIAL TESTS:  Compression Test:  positive for pain Spurling's Test: negative    TODAY'S TREATMENT:                                                                                                                                  Manual Therapy: (to improve soft tissue tightness and mobility and to reduce myofascial adhesions and  restrictions) Pt received STM and TPR to bilat UT/cervical ps and suboccipitals. Static Cupping to bil UT and cervical paraspinals seated  Therapeutic Exercise: Seated UT and LS stretch 2x30 sec bilat Doorway pec stretch 20sec x3 Doorway lat stretch (difficulty on L side due to) L biceps stretch  Neck circles x5 ea (some L sided sub-occipital pain) Open book stretch in sidelying 5" x10ea  TB row/ext Blue TB 2x15ea Wall angel x10         PATIENT EDUCATION:  Education details: dx, response to cupping, relevant anatomy, prognosis, HEP, POC, and rationale  of interventions.  PT answered pt's questions.  Person educated: Patient Education method: Explanation, Demonstration, Tactile cues, Verbal cues Education comprehension: verbalized understanding, returned demonstration, verbal cues required, tactile cues required, and needs further education  HOME EXERCISE PROGRAM: Access Code: [email or web address] URL: [email or web address] Date: 03/18/2023 Prepared by: Aaron Edelman  Exercises - Seated Upper Trapezius Stretch  - 2 x daily - 7 x weekly - 2-3 reps - 20 seconds hold - Seated Cervical Retraction  - 2 x daily - 7 x weekly - 2 sets - 10 reps  ASSESSMENT:  CLINICAL IMPRESSION: Pt remains tight into bilateral cervical ps and suboccipitals. Also noted periscapular tightness and lat restrictions. Trialed lat stretching at doorway with benefit reported, though some biceps discomfort in L UE. Will monitor this. Continues to be tender to palpation with STM to affected areas. Will continue to progress postural re-ed and strengthening as well as decreasing soft tissue restrictions.   OBJECTIVE IMPAIRMENTS: decreased activity tolerance, decreased ROM, decreased strength, hypomobility, increased fascial restrictions, increased muscle spasms, impaired flexibility, impaired UE functional use, and pain.   ACTIVITY LIMITATIONS: lifting, sitting, and dressing  PARTICIPATION LIMITATIONS:  cleaning  PERSONAL FACTORS: 3+ comorbidities: DM type 2, fibromyalgia, myofascial pain, and OA  are also affecting patient's functional outcome.   REHAB POTENTIAL: Good  CLINICAL DECISION MAKING: Stable/uncomplicated  EVALUATION COMPLEXITY: Low   GOALS:  SHORT TERM GOALS: Target date: 04/08/2023   Pt will be independent and compliant with HEP for improved pain, ROM, strength, and function.   Baseline:  Goal status: INITIAL  2.  Pt will report at least a 25% improvement in pain overall.  Baseline:  Goal status: INITIAL  3.  Pt will demo improved L shoulder AROM to 145-150 deg in flexion and 115 deg in abduction for improved reaching and shoulder mobility.  Baseline:  Goal status: INITIAL    Vane TERM GOALS: Target date: 04/29/2023   Pt will demo improved cervical AROM to at least 65/60 deg in R/L rotation and 35 deg in bilat Sb'ing for improved stiffness and mobility with daily activities Baseline:  Goal status: INITIAL  2.  Pt will be able to dress without difficulty and without increased pain.  Baseline:  Goal status: INITIAL  3.  Pt will be able to perform her normal household chores without significant difficulty.  Baseline:  Goal status: INITIAL  4.  Pt will deny having pain distal to L shoulder and report her worst pain to be no > than 3/10. Baseline:  Goal status: INITIAL  5.  Pt will reports she is able to perform her normal reaching activities without significant pain and difficulty.  Baseline:  Goal status: INITIAL    PLAN:  PT FREQUENCY: 2x/week  PT DURATION: 6 weeks  PLANNED INTERVENTIONS: Therapeutic exercises, Therapeutic activity, Neuromuscular re-education, Patient/Family education, Self Care, Joint mobilization, Stair training, Aquatic Therapy, Dry Needling, Electrical stimulation, Spinal mobilization, Cryotherapy, Moist heat, Taping, Ultrasound, Manual therapy, and Re-evaluation  PLAN FOR NEXT SESSION: review and perform HEP.  Cervical  ROM and flexibility, postural strengthening, and manual techniques including STM and cupping.   Riki Altes, PTA  04/19/23 11:58 AM

## 2023-04-19 NOTE — Progress Notes (Unsigned)
Bariatric Nutrition Follow-Up Visit/ 6 month Class Medical Nutrition Therapy   Surgery date: 09/21/2022 Surgery type: RYGB  NUTRITION ASSESSMENT  Anthropometrics  Start weight at [facility]: 213.0 lbs (date: 04/22/2022)  Height: 61 in Weight today: 182.6 lbs.   Clinical  Medical hx: arthritis, asthma, depression, DM, hyperlipidemia, HTN, nerve/muscle disease Medications: see list  Labs: vit D 16.78 Notable signs/symptoms: nothing noted Any previous deficiencies? No Bowel Habits: Every day with no complaints   Body Composition Scale 10/05/2022 01/05/2022 04/19/2023  Current Body Weight 204.0 187.3 182.6  Total Body Fat % 43.9 41.6 40.9  Visceral Fat 16 14 13   Fat-Free Mass % 56.0 58.3 59.0   Total Body Water % 42.5 43.6 44.0  Muscle-Mass lbs 27.3 27.1 27.1  BMI 38.6 35.4 34.5  Body Fat Displacement            Torso  lbs 55.4 48.3 46.2         Left Leg  lbs 11.0 9.6 9.2         Right Leg  lbs 11.0 9.6 9.2         Left Arm  lbs 5.5 4.8 4.6         Right Arm  lbs 5.5 4.8 4.6   Information Reviewed/ Discussed During Appointment: -Review of composition scale numbers -Fluid requirements (64-100 ounces) -Protein requirements (60-80g) -Strategies for tolerating diet -Advancement of diet to include Starchy vegetables -Barriers to inclusion of new foods -Inclusion of appropriate multivitamin and calcium supplements  -Exercise recommendations    Fluid intake: adequate    Medications: See List Supplementation: appropriate     Progress Towards Goal(s):  In Progress Teaching method utilized: Patent attorney & Auditory  Demonstrated degree of understanding via: Teach Back  Readiness Level: Action Barriers to learning/adherence to lifestyle change: none identified   Handouts given during visit include: Maintenance Healthy Eating Guide (6 Months to Maintenance)     Teaching Method Utilized:  Visual Auditory Hands on   Demonstrated degree of understanding via:  Teach Back     Monitoring/Evaluation:  Dietary intake, exercise, and body weight.  Follow up in 3 months for 9 month post-op visit.

## 2023-04-21 ENCOUNTER — Ambulatory Visit (HOSPITAL_BASED_OUTPATIENT_CLINIC_OR_DEPARTMENT_OTHER): Payer: Medicare Other

## 2023-04-21 ENCOUNTER — Encounter (HOSPITAL_BASED_OUTPATIENT_CLINIC_OR_DEPARTMENT_OTHER): Payer: Self-pay

## 2023-04-21 DIAGNOSIS — M542 Cervicalgia: Secondary | ICD-10-CM | POA: Diagnosis not present

## 2023-04-21 DIAGNOSIS — M7918 Myalgia, other site: Secondary | ICD-10-CM

## 2023-04-21 DIAGNOSIS — M25612 Stiffness of left shoulder, not elsewhere classified: Secondary | ICD-10-CM

## 2023-04-21 DIAGNOSIS — M79602 Pain in left arm: Secondary | ICD-10-CM

## 2023-04-21 NOTE — Therapy (Signed)
OUTPATIENT PHYSICAL THERAPY CERVICAL TREATMENT   Patient Name: Megan Murray MRN: [id number] DOB:February 24, 1966, 57 y.o., female Today's Date: 04/21/2023  END OF SESSION:  PT End of Session - 04/21/23 1207     Visit Number 6    Number of Visits 12    Date for PT Re-Evaluation 04/29/23    Authorization Type [employer] MCR    PT Start Time 1134    PT Stop Time 1215    PT Time Calculation (min) 41 min    Activity Tolerance Patient tolerated treatment well    Behavior During Therapy [facility] for tasks assessed/performed               PT End of Session - 04/12/23      Visit Number 3     Number of Visits 12     Date for PT Re-Evaluation 04/29/23     Authorization Type UHC MCR     PT Start Time 1151     PT Stop Time 1235     PT Time Calculation (min) 44 min     Activity Tolerance Patient tolerated treatment well     Behavior During Therapy WFL for tasks assessed/performed       Past Medical History:  Diagnosis Date   Anxiety    Asthma    B12 deficiency 01/23/2015   Contusion of left wrist 07/29/2015   Coronary artery disease moderate based on coronary CT angio of circumflex 04/29/2021   Essential hypertension 11/09/2016   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   Fatty liver 05/16/2018   By CT scan   Fatty liver    Fibromyalgia    Gastric bypass status for obesity 09/21/2022   Gastroesophageal reflux disease without esophagitis 04/26/2019   Hypertension associated with diabetes (HCC) 11/09/2016   Joint pain    Major depression, recurrent, chronic (HCC) 04/25/2018   Dr. Evelene Croon   Mixed hyperlipidemia 11/04/2016   Failed statins; hyperglycemia with repatha. Stopped 06/2018    Myofascial pain 11/12/2019   Neck pain 02/14/2023   Obesity due to excess calories without serious comorbidity    Osteoarthritis, multiple sites 04/25/2018   Pancreatic disease    Pneumonia    PONV (postoperative nausea and vomiting)    Primary insomnia 02/14/2023   Primary osteoarthritis of left  knee 11/12/2019   Primary osteoarthritis of right knee 11/12/2019   Prolonged QT interval 09/27/2021   Seasonal allergic rhinitis due to pollen 05/19/2018   Statin intolerance 06/21/2018   Type 2 diabetes mellitus without complication, without [place]-term current use of insulin (HCC) 11/09/2016   Vitamin D deficiency    Past Surgical History:  Procedure Laterality Date   ABDOMINAL HYSTERECTOMY     CESAREAN SECTION  1994   GALLBLADDER SURGERY  1995   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   Procedure: LAPAROSCOPIC ROUX-EN-Y GASTRIC BYPASS WITH UPPER ENDOSCOPY, LYSIS OF ADHESIONS X 30 MIN;  Surgeon: Gaynelle Adu, MD;  Location: [facility];  Service: General;  Laterality: N/A;   REDUCTION MAMMAPLASTY     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   Anxiety    Pneumonia    PONV (postoperative nausea and vomiting)    Neck pain 02/14/2023   Primary insomnia 02/14/2023   Gastric bypass status for obesity 09/21/2022   Asthma 05/05/2022   Joint pain 05/05/2022   Pancreatic disease 05/05/2022   Vitamin D deficiency 01/20/2022   Prolonged QT interval 09/27/2021   Coronary artery disease moderate based on coronary CT angio  of circumflex 04/29/2021   Obesity due to excess calories without serious comorbidity 02/19/2021   Myofascial pain 11/12/2019   Primary osteoarthritis of left knee 11/12/2019   Primary osteoarthritis of right knee 11/12/2019   Gastroesophageal reflux disease without esophagitis 04/26/2019   Seasonal allergic rhinitis due to pollen 05/19/2018   Fatty liver 05/16/2018   Fibromyalgia 04/25/2018   Major depression, recurrent, chronic (HCC) 04/25/2018   Osteoarthritis, multiple sites 04/25/2018   Hypertension associated with diabetes (HCC) 11/09/2016   Type 2 diabetes mellitus without complication, without [place]-term current use of insulin (HCC) 11/09/2016   Essential hypertension 11/09/2016   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    Mixed hyperlipidemia 11/04/2016   Contusion of left wrist 07/29/2015   B12 deficiency 01/23/2015     REFERRING PROVIDER: Alyson Reedy, FNP  REFERRING DIAG: M54.2 (ICD-10-CM) - Neck pain  THERAPY DIAG:  Pain in left arm  Myofascial pain  Stiffness of left shoulder, not elsewhere classified  Cervicalgia  M25.60  Stiffness of unspecified joint, not elsewhere classified.  Rationale for Evaluation and Treatment: Rehabilitation  ONSET DATE: May 2024  SUBJECTIVE:                                                                                                                                                                                                         SUBJECTIVE STATEMENT: Pt reports having a fibromyalgia flareup starting yesterday afternoon. Increased soreness in shoulder and pecs.    Hand dominance: Right  PERTINENT HISTORY:  DM type 2, fibromyalgia, myofascial pain, HTN, depression, insomnia, OA including knees Gastric bypass 09/2022  PAIN:  NPRS:  4/10 current, 3/10 best, 9/10 worst Type:  constant, sharp, dull, and pinching.  Pt denies N/T Location:  L UT, L cervical.  Pain can travel down L UE to hand.   Alleviating factors:  walking, tylenol, ice, massage, dry needling Aggravating factors:  reaching laterally, prolonged sitting including in the car, lifting objects  PRECAUTIONS: Other: fibromyalgia  RED FLAGS: None     WEIGHT BEARING RESTRICTIONS: No  FALLS:  Has patient fallen in last 6 months? No  LIVING ENVIRONMENT: Lives with: lives with their spouse Lives in: one level home   OCCUPATION: Pt is on disability.   PLOF: Independent  PATIENT GOALS: to be pain free and move more freely.  OBJECTIVE:   DIAGNOSTIC FINDINGS:  Pt had x rays at chiropractor office.  PT unable to view findings.    PATIENT SURVEYS:  NDI:  22  COGNITION: Overall cognitive status: Within functional limits for tasks assessed  SENSATION: 2+ sensation to  LT  t/o bilat UE dermatomes.     PALPATION: TTP in bilat UT. Pt has moderate soft tissue tightness in R UT and moderate (+) soft tissue tightness on L.   CERVICAL ROM:   Active ROM A/PROM (deg) eval  Flexion WFL  Extension 51  Right lateral flexion 19 with pain  Left lateral flexion 22  Right rotation 57  Left rotation 45   (Blank rows = not tested)  UPPER EXTREMITY ROM:  Active ROM Right eval Left eval  Shoulder flexion WFL Pain at 111 deg, 131 total AROM  Shoulder extension    Shoulder abduction 130 92 with pain  Shoulder adduction    Shoulder extension    Shoulder internal rotation    Shoulder external rotation    Elbow flexion    Elbow extension    Wrist flexion    Wrist extension    Wrist ulnar deviation    Wrist radial deviation    Wrist pronation    Wrist supination     (Blank rows = not tested)  Repeated Movements:   Cervical flexion:  no change in pain, felt a stretch in upper thoracic Cervical extension:  no change in pain, feels a little looser in L UT  UPPER EXTREMITY MMT:  MMT Right eval Left eval  Shoulder flexion 4/5 Tol min resistance  Shoulder extension    Shoulder abduction    Shoulder adduction    Shoulder extension    Shoulder internal rotation    Shoulder external rotation    Middle trapezius    Lower trapezius    Elbow flexion 5/5 5/5  Elbow extension 5/5 5/5  Wrist flexion    Wrist extension    Wrist ulnar deviation    Wrist radial deviation    Wrist pronation    Wrist supination    Grip strength     (Blank rows = not tested)  CERVICAL SPECIAL TESTS:  Compression Test:  positive for pain Spurling's Test: negative    TODAY'S TREATMENT:                                                                                                                                  Manual Therapy: (to improve soft tissue tightness and mobility and to reduce myofascial adhesions and restrictions) Pt received STM and TPR to bilat UT/cervical  ps and suboccipitals.   Therapeutic Exercise: Seated UT and LS stretch 2x30 sec bilat Doorway pec stretch 20sec x3 Open book stretch in sidelying 5" x10ea  TB row/ext Blue TB 2x15ea Horizontal abduction RTB 2x10 Bilateral ER RTB 2x10 standing      PATIENT EDUCATION:  Education details: dx, response to cupping, relevant anatomy, prognosis, HEP, POC, and rationale of interventions.  PT answered pt's questions.  Person educated: Patient Education method: Explanation, Demonstration, Tactile cues, Verbal cues Education comprehension: verbalized understanding, returned demonstration, verbal cues required, tactile cues required, and needs further education  HOME EXERCISE  PROGRAM: Access Code: [email or web address] URL: [email or web address] Date: 03/18/2023 Prepared by: Aaron Edelman  Exercises - Seated Upper Trapezius Stretch  - 2 x daily - 7 x weekly - 2-3 reps - 20 seconds hold - Seated Cervical Retraction  - 2 x daily - 7 x weekly - 2 sets - 10 reps  ASSESSMENT:  CLINICAL IMPRESSION: Increased tightness and tenderness today. Spent time on manual interventions to UT/LS, cervical ps, and sub occipital mm to decrease tightness. Held cupping due to bruising still present from previous session. Able to progress postural strengthening, though cues were required for proper form. Pt reported improved pain level and mobility at end of session.   NDI next visit if appropriate.   OBJECTIVE IMPAIRMENTS: decreased activity tolerance, decreased ROM, decreased strength, hypomobility, increased fascial restrictions, increased muscle spasms, impaired flexibility, impaired UE functional use, and pain.   ACTIVITY LIMITATIONS: lifting, sitting, and dressing  PARTICIPATION LIMITATIONS: cleaning  PERSONAL FACTORS: 3+ comorbidities: DM type 2, fibromyalgia, myofascial pain, and OA  are also affecting patient's functional outcome.   REHAB POTENTIAL: Good  CLINICAL DECISION MAKING:  Stable/uncomplicated  EVALUATION COMPLEXITY: Low   GOALS:  SHORT TERM GOALS: Target date: 04/08/2023   Pt will be independent and compliant with HEP for improved pain, ROM, strength, and function.   Baseline:  Goal status: MET  2.  Pt will report at least a 25% improvement in pain overall.  Baseline:  Goal status: IN PROGRESS  3.  Pt will demo improved L shoulder AROM to 145-150 deg in flexion and 115 deg in abduction for improved reaching and shoulder mobility.  Baseline:  Goal status: INITIAL    Hart TERM GOALS: Target date: 04/29/2023   Pt will demo improved cervical AROM to at least 65/60 deg in R/L rotation and 35 deg in bilat Sb'ing for improved stiffness and mobility with daily activities Baseline:  Goal status: INITIAL  2.  Pt will be able to dress without difficulty and without increased pain.  Baseline:  Goal status: INITIAL  3.  Pt will be able to perform her normal household chores without significant difficulty.  Baseline:  Goal status: INITIAL  4.  Pt will deny having pain distal to L shoulder and report her worst pain to be no > than 3/10. Baseline:  Goal status: INITIAL  5.  Pt will reports she is able to perform her normal reaching activities without significant pain and difficulty.  Baseline:  Goal status: INITIAL    PLAN:  PT FREQUENCY: 2x/week  PT DURATION: 6 weeks  PLANNED INTERVENTIONS: Therapeutic exercises, Therapeutic activity, Neuromuscular re-education, Patient/Family education, Self Care, Joint mobilization, Stair training, Aquatic Therapy, Dry Needling, Electrical stimulation, Spinal mobilization, Cryotherapy, Moist heat, Taping, Ultrasound, Manual therapy, and Re-evaluation  PLAN FOR NEXT SESSION: review and perform HEP.  Cervical ROM and flexibility, postural strengthening, and manual techniques including STM and cupping.   Riki Altes, PTA  04/21/23 1:36 PM

## 2023-04-25 ENCOUNTER — Telehealth (HOSPITAL_BASED_OUTPATIENT_CLINIC_OR_DEPARTMENT_OTHER): Payer: Self-pay | Admitting: Family Medicine

## 2023-04-25 ENCOUNTER — Other Ambulatory Visit (HOSPITAL_BASED_OUTPATIENT_CLINIC_OR_DEPARTMENT_OTHER): Payer: Self-pay | Admitting: Family Medicine

## 2023-04-25 DIAGNOSIS — F5101 Primary insomnia: Secondary | ICD-10-CM

## 2023-04-25 MED ORDER — ZOLPIDEM TARTRATE 10 MG PO TABS
ORAL_TABLET | ORAL | 0 refills | Status: DC
Start: 2023-04-25 — End: 2023-05-25

## 2023-04-25 NOTE — Telephone Encounter (Signed)
Prescription Request   04/21/2023   LOV: 04/06/23   What is the name of the medication or equipment? Zolpidem 10mg    Which pharmacy would you like this sent to? [facility] [place]     Please advise at [employer] [phone] (mobile)

## 2023-04-25 NOTE — Progress Notes (Signed)
PDMP reviewed, no red flags present. Zolpidem refill sent to pharmacy on file.

## 2023-04-26 ENCOUNTER — Other Ambulatory Visit: Payer: Self-pay | Admitting: Medical

## 2023-04-26 ENCOUNTER — Encounter (HOSPITAL_BASED_OUTPATIENT_CLINIC_OR_DEPARTMENT_OTHER): Payer: Self-pay

## 2023-04-26 ENCOUNTER — Other Ambulatory Visit (HOSPITAL_BASED_OUTPATIENT_CLINIC_OR_DEPARTMENT_OTHER): Payer: Self-pay | Admitting: Family Medicine

## 2023-04-26 ENCOUNTER — Ambulatory Visit (HOSPITAL_BASED_OUTPATIENT_CLINIC_OR_DEPARTMENT_OTHER): Payer: Medicare Other | Admitting: Physical Therapy

## 2023-04-26 DIAGNOSIS — M79602 Pain in left arm: Secondary | ICD-10-CM

## 2023-04-26 DIAGNOSIS — M797 Fibromyalgia: Secondary | ICD-10-CM

## 2023-04-26 DIAGNOSIS — M25612 Stiffness of left shoulder, not elsewhere classified: Secondary | ICD-10-CM

## 2023-04-26 DIAGNOSIS — M542 Cervicalgia: Secondary | ICD-10-CM

## 2023-04-26 NOTE — Therapy (Signed)
OUTPATIENT PHYSICAL THERAPY CERVICAL TREATMENT   Patient Name: Megan Murray MRN: [id number] DOB:[birth year], 57 y.o., female Today's Date: 04/26/2023  END OF SESSION:  PT End of Session - 04/26/23 1218     Visit Number 6                  Past Medical History:  Diagnosis Date   Anxiety    Asthma    B12 deficiency 01/23/2015   Contusion of left wrist 07/29/2015   Coronary artery disease moderate based on coronary CT angio of circumflex 04/29/2021   Essential hypertension 11/09/2016   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   Fatty liver 05/16/2018   By CT scan   Fatty liver    Fibromyalgia    Gastric bypass status for obesity 09/21/2022   Gastroesophageal reflux disease without esophagitis 04/26/2019   Hypertension associated with diabetes (HCC) 11/09/2016   Joint pain    Major depression, recurrent, chronic (HCC) 04/25/2018   Dr. Evelene Croon   Mixed hyperlipidemia 11/04/2016   Failed statins; hyperglycemia with repatha. Stopped 06/2018    Myofascial pain 11/12/2019   Neck pain 02/14/2023   Obesity due to excess calories without serious comorbidity    Osteoarthritis, multiple sites 04/25/2018   Pancreatic disease    Pneumonia    PONV (postoperative nausea and vomiting)    Primary insomnia 02/14/2023   Primary osteoarthritis of left knee 11/12/2019   Primary osteoarthritis of right knee 11/12/2019   Prolonged QT interval 09/27/2021   Seasonal allergic rhinitis due to pollen 05/19/2018   Statin intolerance 06/21/2018   Type 2 diabetes mellitus without complication, without [place]-term current use of insulin (HCC) 11/09/2016   Vitamin D deficiency    Past Surgical History:  Procedure Laterality Date   ABDOMINAL HYSTERECTOMY     CESAREAN SECTION  1994   GALLBLADDER SURGERY  1995   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   Procedure: LAPAROSCOPIC ROUX-EN-Y GASTRIC BYPASS WITH UPPER ENDOSCOPY, LYSIS OF ADHESIONS X 30 MIN;  Surgeon: Gaynelle Adu, MD;  Location: [facility];   Service: General;  Laterality: N/A;   REDUCTION MAMMAPLASTY     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   Anxiety    Pneumonia    PONV (postoperative nausea and vomiting)    Neck pain 02/14/2023   Primary insomnia 02/14/2023   Gastric bypass status for obesity 09/21/2022   Asthma 05/05/2022   Joint pain 05/05/2022   Pancreatic disease 05/05/2022   Vitamin D deficiency 01/20/2022   Prolonged QT interval 09/27/2021   Coronary artery disease moderate based on coronary CT angio of circumflex 04/29/2021   Obesity due to excess calories without serious comorbidity 02/19/2021   Myofascial pain 11/12/2019   Primary osteoarthritis of left knee 11/12/2019   Primary osteoarthritis of right knee 11/12/2019   Gastroesophageal reflux disease without esophagitis 04/26/2019   Seasonal allergic rhinitis due to pollen 05/19/2018   Fatty liver 05/16/2018   Fibromyalgia 04/25/2018   Major depression, recurrent, chronic (HCC) 04/25/2018   Osteoarthritis, multiple sites 04/25/2018   Hypertension associated with diabetes (HCC) 11/09/2016   Type 2 diabetes mellitus without complication, without [place]-term current use of insulin (HCC) 11/09/2016   Essential hypertension 11/09/2016   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   Mixed hyperlipidemia 11/04/2016   Contusion of left wrist 07/29/2015   B12 deficiency 01/23/2015     REFERRING PROVIDER: Alyson Reedy, FNP  REFERRING DIAG: M54.2 (ICD-10-CM) - Neck pain  THERAPY DIAG:  Cervicalgia  Stiffness of left shoulder,  not elsewhere classified  Pain in left arm  M25.60  Stiffness of unspecified joint, not elsewhere classified.  Rationale for Evaluation and Treatment: Rehabilitation  ONSET DATE: May 2024  SUBJECTIVE:                                                                                                                                                                                                          SUBJECTIVE STATEMENT:    Hand dominance: Right  PERTINENT HISTORY:  DM type 2, fibromyalgia, myofascial pain, HTN, depression, insomnia, OA including knees Gastric bypass 09/2022  PAIN:    PRECAUTIONS: Other: fibromyalgia  RED FLAGS: None     WEIGHT BEARING RESTRICTIONS: No  FALLS:  Has patient fallen in last 6 months? No  LIVING ENVIRONMENT: Lives with: lives with their spouse Lives in: one level home   OCCUPATION: Pt is on disability.   PLOF: Independent  PATIENT GOALS: to be pain free and move more freely.  OBJECTIVE:   DIAGNOSTIC FINDINGS:  Pt had x rays at chiropractor office.  PT unable to view findings.       TODAY'S TREATMENT:                                                                                                                                  Pt did not receive treatment today.      PATIENT EDUCATION:  Education details: dx, response to cupping, relevant anatomy, prognosis, HEP, POC, and rationale of interventions.  PT answered pt's questions.  Person educated: Patient Education method: Explanation, Demonstration, Tactile cues, Verbal cues Education comprehension: verbalized understanding, returned demonstration, verbal cues required, tactile cues required, and needs further education  HOME EXERCISE PROGRAM: Access Code: [email or web address] URL: [email or web address] Date: 03/18/2023 Prepared by: Aaron Edelman  Exercises - Seated Upper Trapezius Stretch  - 2 x daily - 7 x weekly - 2-3 reps - 20 seconds hold - Seated Cervical Retraction  - 2 x daily - 7 x weekly - 2 sets -  10 reps  ASSESSMENT:  CLINICAL IMPRESSION: Pt left treatment today and let the front desk staff know it was 15 mins past her scheduled time.  PT went to get pt out of waiting room at 12:01-12:02 after just finishing with another patient.  Pt had already left.  PT called pt and apologized for being late.  Pt stated she couldn't wait any  longer and had other things she needed to do.    OBJECTIVE IMPAIRMENTS: decreased activity tolerance, decreased ROM, decreased strength, hypomobility, increased fascial restrictions, increased muscle spasms, impaired flexibility, impaired UE functional use, and pain.   ACTIVITY LIMITATIONS: lifting, sitting, and dressing  PARTICIPATION LIMITATIONS: cleaning  PERSONAL FACTORS: 3+ comorbidities: DM type 2, fibromyalgia, myofascial pain, and OA  are also affecting patient's functional outcome.   REHAB POTENTIAL: Good  CLINICAL DECISION MAKING: Stable/uncomplicated  EVALUATION COMPLEXITY: Low   GOALS:  SHORT TERM GOALS: Target date: 04/08/2023   Pt will be independent and compliant with HEP for improved pain, ROM, strength, and function.   Baseline:  Goal status: MET  2.  Pt will report at least a 25% improvement in pain overall.  Baseline:  Goal status: IN PROGRESS  3.  Pt will demo improved L shoulder AROM to 145-150 deg in flexion and 115 deg in abduction for improved reaching and shoulder mobility.  Baseline:  Goal status: INITIAL    [place] TERM GOALS: Target date: 04/29/2023   Pt will demo improved cervical AROM to at least 65/60 deg in R/L rotation and 35 deg in bilat Sb'ing for improved stiffness and mobility with daily activities Baseline:  Goal status: INITIAL  2.  Pt will be able to dress without difficulty and without increased pain.  Baseline:  Goal status: INITIAL  3.  Pt will be able to perform her normal household chores without significant difficulty.  Baseline:  Goal status: INITIAL  4.  Pt will deny having pain distal to L shoulder and report her worst pain to be no > than 3/10. Baseline:  Goal status: INITIAL  5.  Pt will reports she is able to perform her normal reaching activities without significant pain and difficulty.  Baseline:  Goal status: INITIAL    PLAN:  PT FREQUENCY: 2x/week  PT DURATION: 6 weeks  PLANNED INTERVENTIONS:  Therapeutic exercises, Therapeutic activity, Neuromuscular re-education, Patient/Family education, Self Care, Joint mobilization, Stair training, Aquatic Therapy, Dry Needling, Electrical stimulation, Spinal mobilization, Cryotherapy, Moist heat, Taping, Ultrasound, Manual therapy, and Re-evaluation  PLAN FOR NEXT SESSION: review and perform HEP.  Cervical ROM and flexibility, postural strengthening, and manual techniques including STM and cupping.   Audie Clear III PT, DPT 04/26/23 12:18 PM

## 2023-04-27 ENCOUNTER — Encounter (HOSPITAL_BASED_OUTPATIENT_CLINIC_OR_DEPARTMENT_OTHER): Payer: Self-pay | Admitting: Family Medicine

## 2023-04-27 NOTE — Telephone Encounter (Signed)
Mychart sent by pt:  Megan Murray [facility] (supporting Alyson Reedy, FNP)8 minutes ago (11:28 AM)    It has been regimented by my DIL's OB that we are current on our TDAP vacanation  how can I find out if I am? Also am I eligible for the RSV vaccine? Thanks   Last documented Tdap and also last one showing for pt in [facility] was done in 2013.  Foye Clock, please advise on pt's message.

## 2023-04-27 NOTE — Telephone Encounter (Signed)
Medication was refilled 10/14.

## 2023-04-28 ENCOUNTER — Ambulatory Visit (HOSPITAL_BASED_OUTPATIENT_CLINIC_OR_DEPARTMENT_OTHER): Payer: Medicare Other | Admitting: Physical Therapy

## 2023-04-28 DIAGNOSIS — M542 Cervicalgia: Secondary | ICD-10-CM

## 2023-04-28 DIAGNOSIS — M79602 Pain in left arm: Secondary | ICD-10-CM

## 2023-04-28 DIAGNOSIS — M25612 Stiffness of left shoulder, not elsewhere classified: Secondary | ICD-10-CM

## 2023-04-28 NOTE — Therapy (Signed)
OUTPATIENT PHYSICAL THERAPY CERVICAL TREATMENT   Patient Name: Megan Murray MRN: [id number] DOB:April 11, 1966, 57 y.o., female Today's Date: 04/29/2023  END OF SESSION:   PT End of Session - 04/28/23       Visit Number 7     Number of Visits 12     Date for PT Re-Evaluation 04/29/23     Authorization Type UHC MCR     PT Start Time 1150     PT Stop Time 1240     PT Time Calculation (min) 50 min     Activity Tolerance Patient tolerated treatment well     Behavior During Therapy [facility] for tasks assessed/performed            Past Medical History:  Diagnosis Date   Anxiety    Asthma    B12 deficiency 01/23/2015   Contusion of left wrist 07/29/2015   Coronary artery disease moderate based on coronary CT angio of circumflex 04/29/2021   Essential hypertension 11/09/2016   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   Fatty liver 05/16/2018   By CT scan   Fatty liver    Fibromyalgia    Gastric bypass status for obesity 09/21/2022   Gastroesophageal reflux disease without esophagitis 04/26/2019   Hypertension associated with diabetes (HCC) 11/09/2016   Joint pain    Major depression, recurrent, chronic (HCC) 04/25/2018   Dr. Evelene Croon   Mixed hyperlipidemia 11/04/2016   Failed statins; hyperglycemia with repatha. Stopped 06/2018    Myofascial pain 11/12/2019   Neck pain 02/14/2023   Obesity due to excess calories without serious comorbidity    Osteoarthritis, multiple sites 04/25/2018   Pancreatic disease    Pneumonia    PONV (postoperative nausea and vomiting)    Primary insomnia 02/14/2023   Primary osteoarthritis of left knee 11/12/2019   Primary osteoarthritis of right knee 11/12/2019   Prolonged QT interval 09/27/2021   Seasonal allergic rhinitis due to pollen 05/19/2018   Statin intolerance 06/21/2018   Type 2 diabetes mellitus without complication, without Viens-term current use of insulin (HCC) 11/09/2016   Vitamin D deficiency    Past Surgical History:   Procedure Laterality Date   ABDOMINAL HYSTERECTOMY     CESAREAN SECTION  1994   GALLBLADDER SURGERY  1995   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   Procedure: LAPAROSCOPIC ROUX-EN-Y GASTRIC BYPASS WITH UPPER ENDOSCOPY, LYSIS OF ADHESIONS X 30 MIN;  Surgeon: Gaynelle Adu, MD;  Location: [facility];  Service: General;  Laterality: N/A;   REDUCTION MAMMAPLASTY     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   Anxiety    Pneumonia    PONV (postoperative nausea and vomiting)    Neck pain 02/14/2023   Primary insomnia 02/14/2023   Gastric bypass status for obesity 09/21/2022   Asthma 05/05/2022   Joint pain 05/05/2022   Pancreatic disease 05/05/2022   Vitamin D deficiency 01/20/2022   Prolonged QT interval 09/27/2021   Coronary artery disease moderate based on coronary CT angio of circumflex 04/29/2021   Obesity due to excess calories without serious comorbidity 02/19/2021   Myofascial pain 11/12/2019   Primary osteoarthritis of left knee 11/12/2019   Primary osteoarthritis of right knee 11/12/2019   Gastroesophageal reflux disease without esophagitis 04/26/2019   Seasonal allergic rhinitis due to pollen 05/19/2018   Fatty liver 05/16/2018   Fibromyalgia 04/25/2018   Major depression, recurrent, chronic (HCC) 04/25/2018   Osteoarthritis, multiple sites 04/25/2018   Hypertension associated with diabetes (HCC) 11/09/2016  Type 2 diabetes mellitus without complication, without Filion-term current use of insulin (HCC) 11/09/2016   Essential hypertension 11/09/2016   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   Mixed hyperlipidemia 11/04/2016   Contusion of left wrist 07/29/2015   B12 deficiency 01/23/2015     REFERRING PROVIDER: Alyson Reedy, FNP  REFERRING DIAG: M54.2 (ICD-10-CM) - Neck pain  THERAPY DIAG:  Cervicalgia  Stiffness of left shoulder, not elsewhere classified  Pain in left arm  M25.60  Stiffness of unspecified joint, not  elsewhere classified.  Rationale for Evaluation and Treatment: Rehabilitation  ONSET DATE: May 2024  SUBJECTIVE:                                                                                                                                                                                                         SUBJECTIVE STATEMENT:  Pt states she has felt tight.  Pt is having pain in bilat anterior shoulders and cervical.  She has tried meds, heat, exercises, and soft tissue work.  Pt states she was feeling better overall though this week her sx's have worsened.  Pt has difficulty and pain with doffing/donning shirt.  Pt has pain and difficulty picking up an object and putting it on a shelf.    Hand dominance: Right  PERTINENT HISTORY:  DM type 2, fibromyalgia, myofascial pain, HTN, depression, insomnia, OA including knees Gastric bypass 09/2022  PAIN:   NPRS:  5-6/10  Location:  Central cervical, bilat clavicle to anterior shoulders.  PRECAUTIONS: Other: fibromyalgia  RED FLAGS: None     WEIGHT BEARING RESTRICTIONS: No  FALLS:  Has patient fallen in last 6 months? No  LIVING ENVIRONMENT: Lives with: lives with their spouse Lives in: one level home   OCCUPATION: Pt is on disability.   PLOF: Independent  PATIENT GOALS: to be pain free and move more freely.  OBJECTIVE:   DIAGNOSTIC FINDINGS:  Pt had x rays at chiropractor office.  PT unable to view findings.       TODAY'S TREATMENT:  Cercial SB AROM:  Initial/Current:  R: 19/25 with pain, L:  22/15 with pain  UPPER EXTREMITY ROM:   Active ROM Right eval Left eval Right 10/17 Left 10/17  Shoulder flexion WFL Pain at 111 deg, 131 total AROM Pain at 90 deg, 128 deg total Pain at 70 deg, 88 deg total  Shoulder extension        Shoulder abduction 130 92 with pain 70 with pain 80  with pain  Shoulder adduction        Shoulder extension        Shoulder internal rotation        Shoulder external rotation        Elbow flexion        Elbow extension        Wrist flexion        Wrist extension        Wrist ulnar deviation        Wrist radial deviation        Wrist pronation        Wrist supination         (Blank rows = not tested)   Pulleys flex and abd approx 2 min each Seated UT and LS stretch 2x30 sec bilat Doorway pec stretch 3x20 sec Supine wand flexion  Manual Therapy: (to improve soft tissue tightness and mobility and to reduce myofascial adhesions and restrictions) Pt received STM to bilat cervical paraspinals. Static Cupping to bil UT and cervical paraspinals seated     PATIENT EDUCATION:  Education details: dx, response to cupping, objective findings, relevant anatomy, prognosis, HEP, POC, and rationale of interventions.  PT answered pt's questions.  Person educated: Patient Education method: Explanation, Demonstration, Tactile cues, Verbal cues Education comprehension: verbalized understanding, returned demonstration, verbal cues required, tactile cues required, and needs further education  HOME EXERCISE PROGRAM: Access Code: [email or web address] URL: [email or web address] Date: 03/18/2023 Prepared by: Aaron Edelman  Exercises - Seated Upper Trapezius Stretch  - 2 x daily - 7 x weekly - 2-3 reps - 20 seconds hold - Seated Cervical Retraction  - 2 x daily - 7 x weekly - 2 sets - 10 reps  ASSESSMENT:  CLINICAL IMPRESSION: Pt has felt tight this past week and her sx's have worsened.  She has difficulty and pain with doffing/donning shirt and picking up an object and putting it on a shelf.  PT assessed shoulder ROM and she has decreased ROM in bilat shoulders compared to initial eval.  Pt also has pain with shoulder AROM.  Pt demonstrates improved R cervical Sb'ing and worse L cervical Sb'ing AROM.  Pt performed exercises to improve  flexibility and cervical and shoulder ROM.  Pt has soft tissue tightness in bilat UT and cervical paraspinals.  She had an appropriate response to cupping.  Pt responded well to Rx reporting improved neck mobility and states her L shoulder feels better.  Pt reports improved pain to 3-4/10 pain after Rx.  OBJECTIVE IMPAIRMENTS: decreased activity tolerance, decreased ROM, decreased strength, hypomobility, increased fascial restrictions, increased muscle spasms, impaired flexibility, impaired UE functional use, and pain.   ACTIVITY LIMITATIONS: lifting, sitting, and dressing  PARTICIPATION LIMITATIONS: cleaning  PERSONAL FACTORS: 3+ comorbidities: DM type 2, fibromyalgia, myofascial pain, and OA  are also affecting patient's functional outcome.   REHAB POTENTIAL: Good  CLINICAL DECISION MAKING: Stable/uncomplicated  EVALUATION COMPLEXITY: Low   GOALS:  SHORT TERM GOALS: Target date: 04/08/2023   Pt will be independent and compliant with HEP for improved pain,  ROM, strength, and function.   Baseline:  Goal status: MET  2.  Pt will report at least a 25% improvement in pain overall.  Baseline:  Goal status: IN PROGRESS  3.  Pt will demo improved L shoulder AROM to 145-150 deg in flexion and 115 deg in abduction for improved reaching and shoulder mobility.  Baseline:  Goal status: INITIAL    Falls TERM GOALS: Target date: 04/29/2023   Pt will demo improved cervical AROM to at least 65/60 deg in R/L rotation and 35 deg in bilat Sb'ing for improved stiffness and mobility with daily activities Baseline:  Goal status: INITIAL  2.  Pt will be able to dress without difficulty and without increased pain.  Baseline:  Goal status: INITIAL  3.  Pt will be able to perform her normal household chores without significant difficulty.  Baseline:  Goal status: INITIAL  4.  Pt will deny having pain distal to L shoulder and report her worst pain to be no > than 3/10. Baseline:  Goal  status: INITIAL  5.  Pt will reports she is able to perform her normal reaching activities without significant pain and difficulty.  Baseline:  Goal status: INITIAL    PLAN:  PT FREQUENCY: 2x/week  PT DURATION: 6 weeks  PLANNED INTERVENTIONS: Therapeutic exercises, Therapeutic activity, Neuromuscular re-education, Patient/Family education, Self Care, Joint mobilization, Stair training, Aquatic Therapy, Dry Needling, Electrical stimulation, Spinal mobilization, Cryotherapy, Moist heat, Taping, Ultrasound, Manual therapy, and Re-evaluation  PLAN FOR NEXT SESSION: review and perform HEP.  Cervical ROM and flexibility, postural strengthening, and manual techniques including STM and cupping.   Audie Clear III PT, DPT 04/29/23 10:02 PM

## 2023-04-29 ENCOUNTER — Other Ambulatory Visit: Payer: Self-pay | Admitting: Medical

## 2023-04-29 ENCOUNTER — Encounter (HOSPITAL_BASED_OUTPATIENT_CLINIC_OR_DEPARTMENT_OTHER): Payer: Self-pay | Admitting: Family Medicine

## 2023-04-29 ENCOUNTER — Encounter (HOSPITAL_BASED_OUTPATIENT_CLINIC_OR_DEPARTMENT_OTHER): Payer: Self-pay | Admitting: Physical Therapy

## 2023-05-01 ENCOUNTER — Encounter (HOSPITAL_BASED_OUTPATIENT_CLINIC_OR_DEPARTMENT_OTHER): Payer: Self-pay | Admitting: Family Medicine

## 2023-05-01 DIAGNOSIS — M797 Fibromyalgia: Secondary | ICD-10-CM

## 2023-05-02 MED ORDER — TRAMADOL HCL 50 MG PO TABS: ORAL_TABLET | ORAL | 0 refills | Status: DC

## 2023-05-03 ENCOUNTER — Ambulatory Visit (HOSPITAL_BASED_OUTPATIENT_CLINIC_OR_DEPARTMENT_OTHER): Payer: Medicare Other | Admitting: Physical Therapy

## 2023-05-03 ENCOUNTER — Other Ambulatory Visit (HOSPITAL_BASED_OUTPATIENT_CLINIC_OR_DEPARTMENT_OTHER): Payer: Self-pay

## 2023-05-03 DIAGNOSIS — M25612 Stiffness of left shoulder, not elsewhere classified: Secondary | ICD-10-CM

## 2023-05-03 DIAGNOSIS — M542 Cervicalgia: Secondary | ICD-10-CM

## 2023-05-03 DIAGNOSIS — M79602 Pain in left arm: Secondary | ICD-10-CM

## 2023-05-03 MED ORDER — BOOSTRIX 5-2.5-18.5 LF-MCG/0.5 IM SUSY
0.5000 mL | PREFILLED_SYRINGE | Freq: Once | INTRAMUSCULAR | 0 refills | Status: AC
  Filled 2023-05-03: qty 0.5, 1d supply, fill #0

## 2023-05-03 NOTE — Therapy (Addendum)
 OUTPATIENT PHYSICAL THERAPY CERVICAL TREATMENT   Patient Name: Megan Murray MRN: [id number] DOB:08/31/1965, 57 y.o., female Today's Date: 05/04/2023  END OF SESSION:  PT End of Session - 05/03/23 1142     Visit Number 8    Number of Visits 18    Date for PT Re-Evaluation 06/14/23    Authorization Type UHC MCR    PT Start Time 1141    PT Stop Time 1236    PT Time Calculation (min) 55 min    Activity Tolerance Patient tolerated treatment well    Behavior During Therapy [facility] for tasks assessed/performed                 Past Medical History:  Diagnosis Date   Anxiety    Asthma    B12 deficiency 01/23/2015   Contusion of left wrist 07/29/2015   Coronary artery disease moderate based on coronary CT angio of circumflex 04/29/2021   Essential hypertension 11/09/2016   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   Fatty liver 05/16/2018   By CT scan   Fatty liver    Fibromyalgia    Gastric bypass status for obesity 09/21/2022   Gastroesophageal reflux disease without esophagitis 04/26/2019   Hypertension associated with diabetes (HCC) 11/09/2016   Joint pain    Major depression, recurrent, chronic (HCC) 04/25/2018   Dr. Vincente   Mixed hyperlipidemia 11/04/2016   Failed statins; hyperglycemia with repatha . Stopped 06/2018    Myofascial pain 11/12/2019   Neck pain 02/14/2023   Obesity due to excess calories without serious comorbidity    Osteoarthritis, multiple sites 04/25/2018   Pancreatic disease    Pneumonia    PONV (postoperative nausea and vomiting)    Primary insomnia 02/14/2023   Primary osteoarthritis of left knee 11/12/2019   Primary osteoarthritis of right knee 11/12/2019   Prolonged QT interval 09/27/2021   Seasonal allergic rhinitis due to pollen 05/19/2018   Statin intolerance 06/21/2018   Type 2 diabetes mellitus without complication, without [place]-term current use of insulin  (HCC) 11/09/2016   Vitamin D  deficiency    Past Surgical History:   Procedure Laterality Date   ABDOMINAL HYSTERECTOMY     CESAREAN SECTION  1994   GALLBLADDER SURGERY  1995   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   Procedure: LAPAROSCOPIC ROUX-EN-Y GASTRIC BYPASS WITH UPPER ENDOSCOPY, LYSIS OF ADHESIONS X 30 MIN;  Surgeon: Tanda Locus, MD;  Location: [facility];  Service: General;  Laterality: N/A;   REDUCTION MAMMAPLASTY     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   Anxiety    Pneumonia    PONV (postoperative nausea and vomiting)    Neck pain 02/14/2023   Primary insomnia 02/14/2023   Gastric bypass status for obesity 09/21/2022   Asthma 05/05/2022   Joint pain 05/05/2022   Pancreatic disease 05/05/2022   Vitamin D  deficiency 01/20/2022   Prolonged QT interval 09/27/2021   Coronary artery disease moderate based on coronary CT angio of circumflex 04/29/2021   Obesity due to excess calories without serious comorbidity 02/19/2021   Myofascial pain 11/12/2019   Primary osteoarthritis of left knee 11/12/2019   Primary osteoarthritis of right knee 11/12/2019   Gastroesophageal reflux disease without esophagitis 04/26/2019   Seasonal allergic rhinitis due to pollen 05/19/2018   Fatty liver 05/16/2018   Fibromyalgia 04/25/2018   Major depression, recurrent, chronic (HCC) 04/25/2018   Osteoarthritis, multiple sites 04/25/2018   Hypertension associated with diabetes (HCC) 11/09/2016   Type 2 diabetes mellitus without  complication, without Reinard-term current use of insulin  (HCC) 11/09/2016   Essential hypertension 11/09/2016   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   Mixed hyperlipidemia 11/04/2016   Contusion of left wrist 07/29/2015   B12 deficiency 01/23/2015     REFERRING PROVIDER: Towana Small, FNP  REFERRING DIAG: M54.2 (ICD-10-CM) - Neck pain  THERAPY DIAG:  Cervicalgia  Stiffness of left shoulder, not elsewhere classified  Pain in left arm  M25.60  Stiffness of unspecified joint, not  elsewhere classified.  Rationale for Evaluation and Treatment: Rehabilitation  ONSET DATE: May 2024  SUBJECTIVE:                                                                                                                                                                                                         SUBJECTIVE STATEMENT:  Pt states she is feeling better today though had significant pain yesterday and the day before.  Pt states her pain was a 6 yesterday and a 7-8 on Sunday.  Pt states she felt better in the beginning of PT and began to regress last week.  Pt states she is feeling worse now.   FUNCTIONAL IMPROVEMENTS:  none currently.  Pt states she was feeling better when first starting PT but not now.   FUNCTIONAL LIMITATIONS:  At times, she cannot reach out, and other times she can.  Donning shirt and bra.  unable to vacuum and mop and has pain with household chores. Reaching overhead.  pain and difficulty picking up an object and putting it on a shelf.   Pt reports having knots in her biceps/proximal UE.  Pt used a theracane on those knots which helped.  Pt states she felt better after prior Rx.  Pt reports she has relief for a few hours after Rx.    Pt is interested in dry needling.  She had it approx 3-4 years ago for her cervical which did help.  Pt states DN helped her plantar fasciitis.       Hand dominance: Right  PERTINENT HISTORY:  DM type 2, fibromyalgia, myofascial pain, HTN, depression, insomnia, OA including knees Gastric bypass 09/2022  PAIN:   NPRS:  6/10 current, 3/10 worst, 7-8/10 worst Location:  Central cervical, bilat clavicle to anterior shoulders. Pt has constant pain.  Alleviating factors:  walking, tylenol , ice, massage, movement Aggravating factors:  reaching laterally, prolonged sitting including in the car, lifting objects.  Worse 1st thing in AM   PRECAUTIONS: Other: fibromyalgia  RED FLAGS: None     WEIGHT BEARING RESTRICTIONS:  No  FALLS:  Has patient  fallen in last 6 months? No  LIVING ENVIRONMENT: Lives with: lives with their spouse Lives in: one level home   OCCUPATION: Pt is on disability.   PLOF: Independent  PATIENT GOALS: to be pain free and move more freely.  OBJECTIVE:   DIAGNOSTIC FINDINGS:  Pt had x rays at chiropractor office.  PT unable to view findings.       TODAY'S TREATMENT:                                                                                                                                  CERVICAL ROM:    Active ROM A/PROM (deg) eval AROM 10/17 AROM 10/22  Flexion [facility]  WFL  Extension 51  53  Right lateral flexion 19 with pain 25 with pain 21 with pain  Left lateral flexion 22 15 with pain 27 with pain  Right rotation 57  65 with pain  Left rotation 45  49 with pain   (Blank rows = not tested)   UPPER EXTREMITY ROM:   Active ROM Right eval Left eval Right 10/17 Left 10/17 Right 10/22 Left 10/22  Shoulder flexion WFL Pain at 111 deg, 131 total AROM Pain at 90 deg, 128 deg total Pain at 70 deg, 88 deg total 110 with pain Pain at 68, 106 deg total  Shoulder extension          Shoulder abduction 130 92 with pain 70 with pain 80 with pain 98 with pain 82 with pain  Shoulder adduction          Shoulder extension          Shoulder internal rotation          Shoulder external rotation          Elbow flexion          Elbow extension          Wrist flexion          Wrist extension          Wrist ulnar deviation          Wrist radial deviation          Wrist pronation          Wrist supination           (Blank rows = not tested)  PATIENT SURVEYS:  NDI:  Initial/Current:  22/23    Manual Therapy: (to improve soft tissue tightness and mobility and to reduce myofascial adhesions and restrictions) Pt received STM to bilat cervical paraspinals and suboccipitals and suboccipital release in supine. Static and dynamic Cupping to bil UT and cervical  paraspinals seated     PATIENT EDUCATION:  Education details: dx, response to cupping, objective findings, relevant anatomy, prognosis, HEP, POC, and rationale of interventions.  PT answered pt's questions.  Person educated: Patient Education method: Explanation, Demonstration, Tactile cues, Verbal cues Education comprehension: verbalized understanding, returned demonstration, verbal  cues required, tactile cues required, and needs further education  HOME EXERCISE PROGRAM: Access Code: [email or web address] URL: [email or web address] Date: 03/18/2023 Prepared by: Mose Minerva  Exercises - Seated Upper Trapezius Stretch  - 2 x daily - 7 x weekly - 2-3 reps - 20 seconds hold - Seated Cervical Retraction  - 2 x daily - 7 x weekly - 2 sets - 10 reps  ASSESSMENT:  CLINICAL IMPRESSION: Pt felt better in the beginning of PT though began to regress last week.  Pt reports having increased tightness and states she is feeling worse now.  Pt has improved with cervical AROM though has worse elevation AROM in bilat shoulders.  She has difficulty and pain with doffing/donning shirt and picking up an object and putting it on a shelf. Pt denies any functional improvements.  Pt only receives short term pain relief after PT appointments.  Pt has made very minimal progress toward goals.  She met STG #1 and partially met LTG #1.  Pt is planning to make an appt with ortho MD.  PT agreed with pt and instructed her she needed to return to see her primary MD or see an ortho MD.  Pt is interested in dry needling which she has had in the past.  Pt to see MD though also to continue with PT as she may benefit from further PT to improve tightness, ROM, pain, sx's, and function.  Pt reports improved tightness and sx's after Rx today.           OBJECTIVE IMPAIRMENTS: decreased activity tolerance, decreased ROM, decreased strength, hypomobility, increased fascial restrictions, increased muscle spasms, impaired  flexibility, impaired UE functional use, and pain.   ACTIVITY LIMITATIONS: lifting, sitting, and dressing  PARTICIPATION LIMITATIONS: cleaning  PERSONAL FACTORS: 3+ comorbidities: DM type 2, fibromyalgia, myofascial pain, and OA are also affecting patient's functional outcome.   REHAB POTENTIAL: Good  CLINICAL DECISION MAKING: Stable/uncomplicated  EVALUATION COMPLEXITY: Low   GOALS:  SHORT TERM GOALS: Target date: 04/08/2023   Pt will be independent and compliant with HEP for improved pain, ROM, strength, and function.   Baseline:  Goal status: MET  2.  Pt will report at least a 25% improvement in pain overall.  Baseline:  Goal status: NOT MET  10/22  3.  Pt will demo improved L shoulder AROM to 145-150 deg in flexion and 115 deg in abduction for improved reaching and shoulder mobility.  Baseline:  Goal status: NOT MET  10/22    Nudelman TERM GOALS: Target date:  06/14/2023    Pt will demo improved cervical AROM to at least 65/60 deg in R/L rotation and 35 deg in bilat Sb'ing for improved stiffness and mobility with daily activities Baseline:  Goal status: 25% MET  10/22  2.  Pt will be able to dress without difficulty and without increased pain.  Baseline:  Goal status: NOT MET  10/22  3.  Pt will be able to perform her normal household chores without significant difficulty.  Baseline:  Goal status: NOT MET  10/22  4.  Pt will deny having pain distal to L shoulder and report her worst pain to be no > than 3/10. Baseline:  Goal status: NOT MET  10/22  5.  Pt will reports she is able to perform her normal reaching activities without significant pain and difficulty.  Baseline:  Goal status: NOT MET 10/22    PLAN:  PT FREQUENCY: 2x/week  PT DURATION: 5-6 weeks  PLANNED INTERVENTIONS: Therapeutic exercises, Therapeutic activity,  Neuromuscular re-education, Patient/Family education, Self Care, Joint mobilization, Stair training, Aquatic Therapy, Dry Needling,  Electrical stimulation, Spinal mobilization, Cryotherapy, Moist heat, Taping, Ultrasound, Manual therapy, and Re-evaluation  PLAN FOR NEXT SESSION:  Pt to see ortho MD or return to her PCP.  Pt to continue with PT for Cervical ROM and flexibility, postural strengthening, and manual techniques including STM and cupping.  Pt may benefit from DN.     Leigh Minerva III PT, DPT 05/04/23 5:56 PM  PHYSICAL THERAPY DISCHARGE SUMMARY  Visits from Start of Care: 8  Current functional level related to goals / functional outcomes: See above   Remaining deficits: See above   Education / Equipment: See above   Patient was seen in PT from 03/18/2023 - 05/03/2023.  A progress note was completed on her last visit, see above for details.  Pt denied any functional improvements.  Pt  made very minimal progress toward goals.  The plan was for pt to see ortho MD or return to her PCP.  Pt cancelled her remaining PT appointments and will be discharged from skilled PT due to lack of progress.  Leigh Minerva III PT, DPT 05/07/24 3:07 PM

## 2023-05-04 ENCOUNTER — Encounter (HOSPITAL_BASED_OUTPATIENT_CLINIC_OR_DEPARTMENT_OTHER): Payer: Self-pay | Admitting: Physical Therapy

## 2023-05-05 ENCOUNTER — Encounter (HOSPITAL_BASED_OUTPATIENT_CLINIC_OR_DEPARTMENT_OTHER): Payer: Medicare Other

## 2023-05-10 ENCOUNTER — Ambulatory Visit (HOSPITAL_BASED_OUTPATIENT_CLINIC_OR_DEPARTMENT_OTHER): Payer: Medicare Other | Admitting: Physical Therapy

## 2023-05-11 ENCOUNTER — Encounter (HOSPITAL_BASED_OUTPATIENT_CLINIC_OR_DEPARTMENT_OTHER): Payer: Self-pay | Admitting: Orthopaedic Surgery

## 2023-05-11 ENCOUNTER — Ambulatory Visit (HOSPITAL_BASED_OUTPATIENT_CLINIC_OR_DEPARTMENT_OTHER): Payer: Medicare Other

## 2023-05-11 ENCOUNTER — Ambulatory Visit (HOSPITAL_BASED_OUTPATIENT_CLINIC_OR_DEPARTMENT_OTHER): Payer: Medicare Other | Admitting: Orthopaedic Surgery

## 2023-05-11 DIAGNOSIS — M25512 Pain in left shoulder: Secondary | ICD-10-CM

## 2023-05-11 DIAGNOSIS — G8929 Other chronic pain: Secondary | ICD-10-CM

## 2023-05-11 DIAGNOSIS — M7502 Adhesive capsulitis of left shoulder: Secondary | ICD-10-CM

## 2023-05-11 MED ORDER — TRIAMCINOLONE ACETONIDE 40 MG/ML IJ SUSP
80.0000 mg | INTRAMUSCULAR | Status: AC | PRN
  Administered 2023-05-11: 80 mg via INTRA_ARTICULAR

## 2023-05-11 MED ORDER — LIDOCAINE HCL 1 % IJ SOLN
4.0000 mL | INTRAMUSCULAR | Status: AC | PRN
  Administered 2023-05-11: 4 mL

## 2023-05-11 NOTE — Progress Notes (Signed)
Chief Complaint: Left shoulder pain     History of Present Illness:    Megan Murray is a 57 y.o. female presents today with ongoing left shoulder pain since May 2024.  She denies any specific injury or accident.  She has noticed that her motion has been decreasing.  She does have a history of diabetes.  She has been working with physical therapy.  This is somewhat not improving left shoulder.  She has seen a chiropractor for this as well    Surgical History:   None  PMH/PSH/Family History/Social History/Meds/Allergies:    Past Medical History:  Diagnosis Date   Anxiety    Asthma    B12 deficiency 01/23/2015   Contusion of left wrist 07/29/2015   Coronary artery disease moderate based on coronary CT angio of circumflex 04/29/2021   Essential hypertension 11/09/2016   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   Fatty liver 05/16/2018   By CT scan   Fatty liver    Fibromyalgia    Gastric bypass status for obesity 09/21/2022   Gastroesophageal reflux disease without esophagitis 04/26/2019   Hypertension associated with diabetes (HCC) 11/09/2016   Joint pain    Major depression, recurrent, chronic (HCC) 04/25/2018   Dr. Evelene Croon   Mixed hyperlipidemia 11/04/2016   Failed statins; hyperglycemia with repatha. Stopped 06/2018    Myofascial pain 11/12/2019   Neck pain 02/14/2023   Obesity due to excess calories without serious comorbidity    Osteoarthritis, multiple sites 04/25/2018   Pancreatic disease    Pneumonia    PONV (postoperative nausea and vomiting)    Primary insomnia 02/14/2023   Primary osteoarthritis of left knee 11/12/2019   Primary osteoarthritis of right knee 11/12/2019   Prolonged QT interval 09/27/2021   Seasonal allergic rhinitis due to pollen 05/19/2018   Statin intolerance 06/21/2018   Type 2 diabetes mellitus without complication, without Wesolowski-term current use of insulin (HCC) 11/09/2016   Vitamin D deficiency    Past  Surgical History:  Procedure Laterality Date   ABDOMINAL HYSTERECTOMY     CESAREAN SECTION  1994   GALLBLADDER SURGERY  1995   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   Procedure: LAPAROSCOPIC ROUX-EN-Y GASTRIC BYPASS WITH UPPER ENDOSCOPY, LYSIS OF ADHESIONS X 30 MIN;  Surgeon: Gaynelle Adu, MD;  Location: [facility];  Service: General;  Laterality: N/A;   REDUCTION MAMMAPLASTY     Social History   Socioeconomic History   Marital status: Married    Spouse name: Vernell Barrier   Number of children: 1   Years of education: Not on file   Highest education level: Not on file  Occupational History   Occupation: disabled due to fibromyalgia and depression  Tobacco Use   Smoking status: Never   Smokeless tobacco: Never  Vaping Use   Vaping status: Never Used  Substance and Sexual Activity   Alcohol use: No   Drug use: No   Sexual activity: Yes  Other Topics Concern   Not on file  Social History Narrative   Not on file   Social Determinants of Health   Financial Resource Strain: Low Risk  (02/22/2023)   Overall Financial Resource Strain (CARDIA)    Difficulty of Paying Living Expenses: Not hard at all  Food Insecurity: No Food Insecurity (02/22/2023)   Hunger Vital Sign    Worried About  Running Out of Food in the Last Year: Never true    Ran Out of Food in the Last Year: Never true  Transportation Needs: No Transportation Needs (02/22/2023)   PRAPARE - Administrator, Civil Service (Medical): No    Lack of Transportation (Non-Medical): No  Physical Activity: Insufficiently Active (02/22/2023)   Exercise Vital Sign    Days of Exercise per Week: 4 days    Minutes of Exercise per Session: 30 min  Stress: Stress Concern Present (02/22/2023)   [employer] of Occupational Health - Occupational Stress Questionnaire    Feeling of Stress : To some extent  Social Connections: Moderately Integrated (02/22/2023)   Social Connection and Isolation Panel [NHANES]    Frequency of  Communication with Friends and Family: More than three times a week    Frequency of Social Gatherings with Friends and Family: More than three times a week    Attends Religious Services: More than 4 times per year    Active Member of [employer] or Organizations: No    Attends Engineer, structural: Never    Marital Status: Married   Family History  Problem Relation Age of Onset   Heart disease Mother    Hypertension Mother    COPD Mother        Died at 88   Heart failure Mother        Not sure of details   Depression Mother    Liver disease Father    Esophageal cancer Father        Survived cancer treatment and surgery   Drug abuse Father    Cirrhosis Father        NAFLD   Diabetes Mellitus II Sister    Heart attack Sister    Diabetes Brother    Depression Brother    Heart attack Brother    Heart disease Brother    Diabetes Maternal Grandmother    Arthritis Maternal Grandmother    Diabetes Maternal Grandfather    Cancer Paternal Grandmother    Hearing loss Paternal Grandmother    Liver disease Paternal Grandfather    Allergies  Allergen Reactions   Codeine Palpitations    Natural or Synthetic Codeine   Atorvastatin Nausea Only and Other (See Comments)    Myalgias   Cholestyramine Diarrhea and Nausea Only   Crestor [Rosuvastatin Calcium] Nausea Only and Other (See Comments)    Myalgias   Fenofibrate Diarrhea and Nausea And Vomiting   Simvastatin Nausea Only and Other (See Comments)    Myalgias   Soy Allergy Diarrhea and Nausea And Vomiting   Statins Nausea Only and Other (See Comments)     Myalgias   Ozempic (0.25 Or 0.5 Mg-Dose) [Semaglutide(0.25 Or 0.5mg -Dos)]     Caused pancreatitis   Repatha [Evolocumab] Other (See Comments)    flu   Synjardy [Empagliflozin-Metformin Hcl] Other (See Comments)    Caused pancreatitis   Trulicity [Dulaglutide] Other (See Comments)    Pancreatitis    Meloxicam Rash   Current Outpatient Medications  Medication Sig  Dispense Refill   Bempedoic Acid-Ezetimibe (NEXLIZET) 180-10 MG TABS Take 1 tablet by mouth daily. 90 tablet 0   Continuous Blood Gluc Receiver (FREESTYLE LIBRE 2 READER) DEVI Use as directed 1 each 0   Continuous Glucose Sensor (FREESTYLE LIBRE 2 SENSOR) MISC USE 1 SENSOR TO TEST BLOOD SUGAR 2 each 5   FLUoxetine (PROZAC) 40 MG capsule Take 1 capsule (40 mg total) by mouth daily. 90 capsule 3  fluticasone-salmeterol (WIXELA INHUB) 250-50 MCG/ACT AEPB Inhale 1 puff into the lungs 2 (two) times daily. 180 each 0   insulin lispro (HUMALOG KWIKPEN) 100 UNIT/ML KwikPen Inject 6 Units into the skin 3 (three) times daily. 15 mL 11   losartan (COZAAR) 25 MG tablet Take 1 tablet (25 mg total) by mouth daily. 90 tablet 2   ONETOUCH ULTRA test strip USE TO CHECK THREE TIMES DAILY 100 strip 0   traMADol (ULTRAM) 50 MG tablet 1 tab po bid prn pain 30 tablet 0   TRESIBA FLEXTOUCH 200 UNIT/ML FlexTouch Pen Inject 25 Units into the skin as directed. 25 units in the morning and 15 units at night     zolpidem (AMBIEN) 10 MG tablet TAKE 1 TABLET BY MOUTH EVERY NIGHT AT BEDTIME AS NEEDED FOR INSOMNIA 30 tablet 0   No current facility-administered medications for this visit.   No results found.  Review of Systems:   A ROS was performed including pertinent positives and negatives as documented in the HPI.  Physical Exam :   Constitutional: NAD and appears stated age Neurological: Alert and oriented Psych: Appropriate affect and cooperative There were no vitals taken for this visit.   Comprehensive Musculoskeletal Exam:    Left shoulder with forward elevation to 140 degrees with pain.  External rotation of the side is to 20 although this is guarding with pain.  Internal rotation is to back pocket  Imaging:   Xray (3 views left shoulder): Normal   I personally reviewed and interpreted the radiographs.   Assessment:   57 y.o. female with left shoulder pain consistent with adhesive capsulitis.   Overall I described that I would recommend an initial ultrasound-guided injection.  Will plan to proceed with this today.  I will see her back in 2 weeks for reassessment  Plan :    -Left shoulder ultrasound-guided injection provided after verbal consent obtained    Procedure Note  Patient: Megan Murray             Date of Birth: Oct 17, 1965           MRN: [id number]             Visit Date: 05/11/2023  Procedures: Visit Diagnoses:  1. Chronic left shoulder pain     Large Joint Inj: L glenohumeral on 05/11/2023 12:04 PM Indications: pain Details: 22 G 1.5 in needle, ultrasound-guided anterior approach  Arthrogram: No  Medications: 4 mL lidocaine 1 %; 80 mg triamcinolone acetonide 40 MG/ML Outcome: tolerated well, no immediate complications Procedure, treatment alternatives, risks and benefits explained, specific risks discussed. Consent was given by the patient. Immediately prior to procedure a time out was called to verify the correct patient, procedure, equipment, support staff and site/side marked as required. Patient was prepped and draped in the usual sterile fashion.            I personally saw and evaluated the patient, and participated in the management and treatment plan.  Huel Cote, MD Attending Physician, Orthopedic Surgery  This document was dictated using Dragon voice recognition software. A reasonable attempt at proof reading has been made to minimize errors.

## 2023-05-12 ENCOUNTER — Ambulatory Visit (HOSPITAL_BASED_OUTPATIENT_CLINIC_OR_DEPARTMENT_OTHER): Payer: Medicare Other | Admitting: Physical Therapy

## 2023-05-25 ENCOUNTER — Other Ambulatory Visit (HOSPITAL_BASED_OUTPATIENT_CLINIC_OR_DEPARTMENT_OTHER): Payer: Self-pay | Admitting: Family Medicine

## 2023-05-25 ENCOUNTER — Telehealth (HOSPITAL_BASED_OUTPATIENT_CLINIC_OR_DEPARTMENT_OTHER): Payer: Self-pay | Admitting: Family Medicine

## 2023-05-25 DIAGNOSIS — F5101 Primary insomnia: Secondary | ICD-10-CM

## 2023-05-25 MED ORDER — ZOLPIDEM TARTRATE 10 MG PO TABS: ORAL_TABLET | ORAL | 0 refills | Status: DC

## 2023-05-25 NOTE — Telephone Encounter (Signed)
Pease send in  zolpidem (AMBIEN) 10 MG table

## 2023-05-25 NOTE — Progress Notes (Signed)
PDMP reviewed, no red flags present. 30-day supply sent in for Ambien.

## 2023-05-26 ENCOUNTER — Ambulatory Visit (HOSPITAL_BASED_OUTPATIENT_CLINIC_OR_DEPARTMENT_OTHER): Payer: Medicare Other | Admitting: Orthopaedic Surgery

## 2023-06-02 ENCOUNTER — Encounter (HOSPITAL_BASED_OUTPATIENT_CLINIC_OR_DEPARTMENT_OTHER): Payer: Self-pay | Admitting: Family Medicine

## 2023-06-07 ENCOUNTER — Other Ambulatory Visit (HOSPITAL_BASED_OUTPATIENT_CLINIC_OR_DEPARTMENT_OTHER): Payer: Self-pay

## 2023-06-07 MED ORDER — TIMOLOL MALEATE 0.5 % OP SOLN
1.0000 [drp] | Freq: Two times a day (BID) | OPHTHALMIC | 5 refills | Status: AC
  Filled 2023-06-16: qty 5, 50d supply, fill #0
  Filled 2023-08-11 – 2023-08-12 (×2): qty 5, 50d supply, fill #1

## 2023-06-07 MED ORDER — BRIMONIDINE TARTRATE 0.1 % OP SOLN
1.0000 [drp] | Freq: Two times a day (BID) | OPHTHALMIC | 5 refills | Status: AC
  Filled 2023-06-16: qty 5, 50d supply, fill #0

## 2023-06-07 MED ORDER — BAQSIMI TWO PACK 3 MG/DOSE NA POWD
NASAL | 11 refills | Status: AC
  Filled 2023-06-16: qty 2, 7d supply, fill #0
  Filled 2023-07-01: qty 2, 7d supply, fill #1
  Filled 2023-07-09: qty 2, 7d supply, fill #2
  Filled 2023-07-17: qty 2, 7d supply, fill #3
  Filled 2023-08-11 – 2023-08-12 (×2): qty 2, 7d supply, fill #4
  Filled 2023-09-08: qty 2, 7d supply, fill #5
  Filled 2023-09-19: qty 2, 7d supply, fill #6
  Filled 2023-12-12: qty 2, 7d supply, fill #7
  Filled 2023-12-18: qty 2, 7d supply, fill #8
  Filled 2024-02-21: qty 2, 7d supply, fill #9
  Filled 2024-02-27: qty 2, 7d supply, fill #10

## 2023-06-07 MED ORDER — LEVOCETIRIZINE DIHYDROCHLORIDE 5 MG PO TABS
5.0000 mg | ORAL_TABLET | Freq: Every evening | ORAL | 4 refills | Status: DC
  Filled 2023-06-16: qty 30, 30d supply, fill #0
  Filled 2023-07-17: qty 30, 30d supply, fill #1
  Filled 2023-08-14: qty 30, 30d supply, fill #2
  Filled 2023-09-13: qty 30, 30d supply, fill #3

## 2023-06-07 MED ORDER — INSULIN LISPRO (1 UNIT DIAL) 100 UNIT/ML (KWIKPEN)
3.0000 [IU] | PEN_INJECTOR | Freq: Three times a day (TID) | SUBCUTANEOUS | 5 refills | Status: DC
  Filled 2023-06-16: qty 3, 34d supply, fill #0
  Filled 2023-07-19: qty 3, 28d supply, fill #1
  Filled 2023-08-14: qty 3, 28d supply, fill #2
  Filled 2023-08-24 – 2023-09-13 (×2): qty 3, 28d supply, fill #3
  Filled 2023-10-01 – 2023-10-07 (×2): qty 3, 28d supply, fill #4
  Filled 2023-10-12 – 2023-12-21 (×2): qty 3, 28d supply, fill #5
  Filled ????-??-??: fill #5

## 2023-06-16 ENCOUNTER — Other Ambulatory Visit (HOSPITAL_BASED_OUTPATIENT_CLINIC_OR_DEPARTMENT_OTHER): Payer: Self-pay

## 2023-06-16 ENCOUNTER — Other Ambulatory Visit (HOSPITAL_BASED_OUTPATIENT_CLINIC_OR_DEPARTMENT_OTHER): Payer: Self-pay | Admitting: Family Medicine

## 2023-06-16 ENCOUNTER — Other Ambulatory Visit: Payer: Self-pay | Admitting: Medical

## 2023-06-16 ENCOUNTER — Other Ambulatory Visit: Payer: Self-pay

## 2023-06-16 DIAGNOSIS — M797 Fibromyalgia: Secondary | ICD-10-CM

## 2023-06-16 DIAGNOSIS — E119 Type 2 diabetes mellitus without complications: Secondary | ICD-10-CM

## 2023-06-16 DIAGNOSIS — F5101 Primary insomnia: Secondary | ICD-10-CM

## 2023-06-16 MED ORDER — MAXITROL 3.5-10000-0.1 OP OINT
TOPICAL_OINTMENT | OPHTHALMIC | 0 refills | Status: AC
  Filled 2023-06-16: qty 3.5, 14d supply, fill #0

## 2023-06-16 MED FILL — Bempedoic Acid-Ezetimibe Tab 180-10 MG: ORAL | 30 days supply | Qty: 30 | Fill #0 | Status: AC

## 2023-06-16 MED FILL — Continuous Glucose System Sensor: 28 days supply | Qty: 2 | Fill #0 | Status: AC

## 2023-06-17 ENCOUNTER — Other Ambulatory Visit (HOSPITAL_BASED_OUTPATIENT_CLINIC_OR_DEPARTMENT_OTHER): Payer: Self-pay

## 2023-06-17 MED ORDER — ZOLPIDEM TARTRATE 10 MG PO TABS
ORAL_TABLET | ORAL | 0 refills | Status: DC
  Filled 2023-06-17 – 2023-06-22 (×3): qty 30, 30d supply, fill #0

## 2023-06-17 MED ORDER — ONETOUCH ULTRA VI STRP
ORAL_STRIP | 0 refills | Status: DC
  Filled 2023-06-17 – 2023-06-20 (×2): qty 100, 30d supply, fill #0

## 2023-06-17 MED ORDER — TRESIBA FLEXTOUCH 200 UNIT/ML ~~LOC~~ SOPN
25.0000 [IU] | PEN_INJECTOR | SUBCUTANEOUS | 2 refills | Status: DC
  Filled 2023-06-17 – 2023-06-20 (×2): qty 3, 9d supply, fill #0
  Filled 2023-07-01: qty 3, 9d supply, fill #1
  Filled 2023-07-11: qty 3, 9d supply, fill #2

## 2023-06-17 MED ORDER — TRAMADOL HCL 50 MG PO TABS
50.0000 mg | ORAL_TABLET | Freq: Two times a day (BID) | ORAL | 0 refills | Status: DC | PRN
  Filled 2023-06-17 – 2023-06-20 (×2): qty 30, 15d supply, fill #0

## 2023-06-18 ENCOUNTER — Other Ambulatory Visit (HOSPITAL_BASED_OUTPATIENT_CLINIC_OR_DEPARTMENT_OTHER): Payer: Self-pay

## 2023-06-19 ENCOUNTER — Other Ambulatory Visit (HOSPITAL_BASED_OUTPATIENT_CLINIC_OR_DEPARTMENT_OTHER): Payer: Self-pay

## 2023-06-20 ENCOUNTER — Other Ambulatory Visit (HOSPITAL_BASED_OUTPATIENT_CLINIC_OR_DEPARTMENT_OTHER): Payer: Self-pay

## 2023-06-20 ENCOUNTER — Other Ambulatory Visit: Payer: Self-pay

## 2023-06-22 ENCOUNTER — Other Ambulatory Visit: Payer: Self-pay

## 2023-06-22 ENCOUNTER — Other Ambulatory Visit (HOSPITAL_BASED_OUTPATIENT_CLINIC_OR_DEPARTMENT_OTHER): Payer: Self-pay

## 2023-07-09 ENCOUNTER — Other Ambulatory Visit (HOSPITAL_BASED_OUTPATIENT_CLINIC_OR_DEPARTMENT_OTHER): Payer: Self-pay

## 2023-07-17 MED FILL — Bempedoic Acid-Ezetimibe Tab 180-10 MG: ORAL | 30 days supply | Qty: 30 | Fill #1 | Status: CN

## 2023-07-17 MED FILL — Continuous Glucose System Sensor: 28 days supply | Qty: 2 | Fill #1 | Status: AC

## 2023-07-18 ENCOUNTER — Other Ambulatory Visit (HOSPITAL_BASED_OUTPATIENT_CLINIC_OR_DEPARTMENT_OTHER): Payer: Self-pay

## 2023-07-18 ENCOUNTER — Other Ambulatory Visit (HOSPITAL_BASED_OUTPATIENT_CLINIC_OR_DEPARTMENT_OTHER): Payer: Self-pay | Admitting: Family Medicine

## 2023-07-18 ENCOUNTER — Other Ambulatory Visit: Payer: Self-pay

## 2023-07-18 DIAGNOSIS — Z1231 Encounter for screening mammogram for malignant neoplasm of breast: Secondary | ICD-10-CM

## 2023-07-22 ENCOUNTER — Other Ambulatory Visit (HOSPITAL_BASED_OUTPATIENT_CLINIC_OR_DEPARTMENT_OTHER): Payer: Self-pay | Admitting: Family Medicine

## 2023-07-22 ENCOUNTER — Other Ambulatory Visit: Payer: Self-pay | Admitting: Family Medicine

## 2023-07-22 DIAGNOSIS — F5101 Primary insomnia: Secondary | ICD-10-CM

## 2023-07-22 DIAGNOSIS — E119 Type 2 diabetes mellitus without complications: Secondary | ICD-10-CM

## 2023-07-25 ENCOUNTER — Other Ambulatory Visit (HOSPITAL_BASED_OUTPATIENT_CLINIC_OR_DEPARTMENT_OTHER): Payer: Self-pay

## 2023-07-25 ENCOUNTER — Ambulatory Visit (HOSPITAL_BASED_OUTPATIENT_CLINIC_OR_DEPARTMENT_OTHER)
Admission: RE | Admit: 2023-07-25 | Discharge: 2023-07-25 | Disposition: A | Discharge status: Home / Self Care | Payer: Self-pay | Source: Ambulatory Visit | Attending: Family Medicine | Admitting: Family Medicine

## 2023-07-25 ENCOUNTER — Telehealth (HOSPITAL_BASED_OUTPATIENT_CLINIC_OR_DEPARTMENT_OTHER): Payer: Self-pay | Admitting: Family Medicine

## 2023-07-25 ENCOUNTER — Encounter (HOSPITAL_BASED_OUTPATIENT_CLINIC_OR_DEPARTMENT_OTHER): Payer: Self-pay | Admitting: Radiology

## 2023-07-25 DIAGNOSIS — Z1231 Encounter for screening mammogram for malignant neoplasm of breast: Secondary | ICD-10-CM | POA: Insufficient documentation

## 2023-07-25 NOTE — Telephone Encounter (Signed)
 Patient came by the office due to having issues trying to get her Ambien  refilled.  Patient states that she has been on Ambien  every night since 2004 so she can be able to sleep at night.  She states without the Ambien , she is not able to sleep.  Patient did go to the pharmacy to see if there was an Rx at the pharmacy and they told her that there was not one there.   Pt is a former pt of [facility] and has an upcoming appt scheduled with Thersia in March 2025.  Patient states that she only has one pill left of her Ambien  and is in dire need to have this refilled and is wanting to know if this might be able to be refilled prior to her appt with Thersia.   Sending to [place] for her to review.  Pharmacy is [facility].  Alexis, please advise.

## 2023-07-26 ENCOUNTER — Other Ambulatory Visit (HOSPITAL_BASED_OUTPATIENT_CLINIC_OR_DEPARTMENT_OTHER): Payer: Self-pay

## 2023-07-26 ENCOUNTER — Other Ambulatory Visit (HOSPITAL_BASED_OUTPATIENT_CLINIC_OR_DEPARTMENT_OTHER): Payer: Self-pay | Admitting: Family Medicine

## 2023-07-26 ENCOUNTER — Other Ambulatory Visit: Payer: Self-pay

## 2023-07-26 DIAGNOSIS — F5101 Primary insomnia: Secondary | ICD-10-CM

## 2023-07-26 MED ORDER — ZOLPIDEM TARTRATE 10 MG PO TABS
ORAL_TABLET | ORAL | 3 refills | Status: DC
  Filled 2023-07-26: qty 30, 30d supply, fill #0
  Filled 2023-08-24: qty 30, 30d supply, fill #1
  Filled 2023-09-19 – 2023-09-22 (×2): qty 30, 30d supply, fill #2
  Filled 2023-10-22 (×2): qty 30, 30d supply, fill #3

## 2023-07-26 MED ORDER — ONETOUCH ULTRA TEST VI STRP
ORAL_STRIP | 0 refills | Status: DC
  Filled 2023-07-26: qty 100, 100d supply, fill #0

## 2023-07-26 MED ORDER — TRESIBA FLEXTOUCH 200 UNIT/ML ~~LOC~~ SOPN
25.0000 [IU] | PEN_INJECTOR | SUBCUTANEOUS | 2 refills | Status: DC
  Filled 2023-07-26: qty 6, 30d supply, fill #0
  Filled 2023-09-08: qty 3, 15d supply, fill #1

## 2023-07-26 NOTE — Telephone Encounter (Signed)
 Called and spoke with pt letting her know that her Remus Loffler had been refilled and she verbalized understanding. Nothing further needed.

## 2023-07-29 ENCOUNTER — Other Ambulatory Visit (HOSPITAL_BASED_OUTPATIENT_CLINIC_OR_DEPARTMENT_OTHER): Payer: Self-pay

## 2023-08-01 ENCOUNTER — Other Ambulatory Visit (HOSPITAL_BASED_OUTPATIENT_CLINIC_OR_DEPARTMENT_OTHER): Payer: Self-pay

## 2023-08-05 ENCOUNTER — Other Ambulatory Visit: Payer: Self-pay

## 2023-08-11 MED FILL — Continuous Glucose System Sensor: 28 days supply | Qty: 2 | Fill #2 | Status: CN

## 2023-08-11 MED FILL — Bempedoic Acid-Ezetimibe Tab 180-10 MG: ORAL | 30 days supply | Qty: 30 | Fill #1 | Status: CN

## 2023-08-12 ENCOUNTER — Other Ambulatory Visit (HOSPITAL_BASED_OUTPATIENT_CLINIC_OR_DEPARTMENT_OTHER): Payer: Self-pay

## 2023-08-12 ENCOUNTER — Other Ambulatory Visit: Payer: Self-pay

## 2023-08-12 MED ORDER — FREESTYLE LIBRE 3 PLUS SENSOR MISC
5 refills | Status: DC
  Filled 2023-08-12: qty 2, 30d supply, fill #0
  Filled 2023-09-11: qty 2, 30d supply, fill #1
  Filled 2023-12-21 – 2023-12-28 (×2): qty 2, 30d supply, fill #2
  Filled 2024-02-01: qty 2, 30d supply, fill #3
  Filled 2024-02-21 – 2024-02-27 (×2): qty 2, 30d supply, fill #4
  Filled 2024-03-14 – 2024-03-27 (×2): qty 2, 30d supply, fill #5

## 2023-08-12 MED ORDER — FREESTYLE LIBRE 3 READER DEVI
0 refills | Status: DC
  Filled 2023-08-12: qty 1, 1d supply, fill #0

## 2023-08-14 MED FILL — Continuous Glucose System Sensor: 28 days supply | Qty: 2 | Fill #2 | Status: AC

## 2023-08-15 ENCOUNTER — Other Ambulatory Visit (HOSPITAL_BASED_OUTPATIENT_CLINIC_OR_DEPARTMENT_OTHER): Payer: Self-pay

## 2023-08-15 ENCOUNTER — Other Ambulatory Visit: Payer: Self-pay

## 2023-08-17 ENCOUNTER — Other Ambulatory Visit (HOSPITAL_BASED_OUTPATIENT_CLINIC_OR_DEPARTMENT_OTHER): Payer: Self-pay

## 2023-08-19 DIAGNOSIS — E113312 Type 2 diabetes mellitus with moderate nonproliferative diabetic retinopathy with macular edema, left eye: Secondary | ICD-10-CM | POA: Diagnosis not present

## 2023-08-20 ENCOUNTER — Other Ambulatory Visit (HOSPITAL_BASED_OUTPATIENT_CLINIC_OR_DEPARTMENT_OTHER): Payer: Self-pay

## 2023-08-25 ENCOUNTER — Other Ambulatory Visit: Payer: Self-pay

## 2023-08-25 ENCOUNTER — Other Ambulatory Visit (HOSPITAL_BASED_OUTPATIENT_CLINIC_OR_DEPARTMENT_OTHER): Payer: Self-pay

## 2023-08-31 ENCOUNTER — Other Ambulatory Visit: Payer: Self-pay

## 2023-09-05 ENCOUNTER — Other Ambulatory Visit: Payer: Self-pay

## 2023-09-05 ENCOUNTER — Other Ambulatory Visit (HOSPITAL_BASED_OUTPATIENT_CLINIC_OR_DEPARTMENT_OTHER): Payer: Self-pay

## 2023-09-08 ENCOUNTER — Other Ambulatory Visit (HOSPITAL_BASED_OUTPATIENT_CLINIC_OR_DEPARTMENT_OTHER): Payer: Self-pay

## 2023-09-08 ENCOUNTER — Other Ambulatory Visit: Payer: Self-pay

## 2023-09-08 ENCOUNTER — Other Ambulatory Visit: Payer: Self-pay | Admitting: Family Medicine

## 2023-09-08 DIAGNOSIS — E119 Type 2 diabetes mellitus without complications: Secondary | ICD-10-CM

## 2023-09-08 MED ORDER — ONETOUCH ULTRA TEST VI STRP
ORAL_STRIP | 12 refills | Status: AC
  Filled 2023-09-08: qty 100, fill #0
  Filled 2023-09-13: qty 100, 90d supply, fill #0
  Filled 2023-09-26: qty 100, fill #0
  Filled 2023-11-01: qty 100, 100d supply, fill #0
  Filled 2024-02-21: qty 100, 100d supply, fill #1
  Filled 2024-03-05 – 2024-06-04 (×2): qty 100, 100d supply, fill #2
  Filled 2024-06-26 – 2024-07-09 (×2): qty 100, 100d supply, fill #3

## 2023-09-08 NOTE — Telephone Encounter (Signed)
 Former Market researcher pt. Has an upcoming appt scheduled with Alexis. Please advise if you are okay refilling pt's med.

## 2023-09-11 ENCOUNTER — Other Ambulatory Visit (HOSPITAL_BASED_OUTPATIENT_CLINIC_OR_DEPARTMENT_OTHER): Payer: Self-pay | Admitting: Family Medicine

## 2023-09-11 DIAGNOSIS — I152 Hypertension secondary to endocrine disorders: Secondary | ICD-10-CM

## 2023-09-11 MED FILL — Bempedoic Acid-Ezetimibe Tab 180-10 MG: ORAL | 30 days supply | Qty: 30 | Fill #1 | Status: CN

## 2023-09-12 ENCOUNTER — Other Ambulatory Visit (HOSPITAL_BASED_OUTPATIENT_CLINIC_OR_DEPARTMENT_OTHER): Payer: Self-pay

## 2023-09-12 MED ORDER — LOSARTAN POTASSIUM 25 MG PO TABS
25.0000 mg | ORAL_TABLET | Freq: Every day | ORAL | 2 refills | Status: DC
  Filled 2023-09-12: qty 90, 90d supply, fill #0
  Filled 2023-12-12: qty 90, 90d supply, fill #1
  Filled 2024-03-14: qty 90, 90d supply, fill #2

## 2023-09-13 ENCOUNTER — Other Ambulatory Visit: Payer: Self-pay | Admitting: Medical

## 2023-09-14 ENCOUNTER — Other Ambulatory Visit (HOSPITAL_COMMUNITY): Payer: Self-pay

## 2023-09-14 ENCOUNTER — Telehealth: Payer: Self-pay

## 2023-09-14 ENCOUNTER — Telehealth: Payer: Self-pay | Admitting: Pharmacy Technician

## 2023-09-14 ENCOUNTER — Other Ambulatory Visit (HOSPITAL_BASED_OUTPATIENT_CLINIC_OR_DEPARTMENT_OTHER): Payer: Self-pay

## 2023-09-14 ENCOUNTER — Other Ambulatory Visit: Payer: Self-pay

## 2023-09-14 MED ORDER — FLUOXETINE HCL 40 MG PO CAPS
40.0000 mg | ORAL_CAPSULE | Freq: Every day | ORAL | 3 refills | Status: AC
  Filled 2023-09-14: qty 90, 90d supply, fill #0
  Filled 2023-12-12: qty 90, 90d supply, fill #1
  Filled 2024-05-03: qty 90, 90d supply, fill #2

## 2023-09-14 NOTE — Telephone Encounter (Signed)
 Noted.

## 2023-09-14 NOTE — Telephone Encounter (Signed)
 Pharmacy Patient Advocate Encounter   Received notification from Pt Calls Messages that prior authorization for nexlizet is required/requested.   Insurance verification completed.   The patient is insured through [facility] ADVANTAGE/RX ADVANCE .   Per test claim: PA required; PA submitted to above mentioned insurance via CoverMyMeds Key/confirmation #/EOC [id number] Status is pending

## 2023-09-15 ENCOUNTER — Other Ambulatory Visit (HOSPITAL_COMMUNITY): Payer: Self-pay

## 2023-09-15 ENCOUNTER — Other Ambulatory Visit (HOSPITAL_BASED_OUTPATIENT_CLINIC_OR_DEPARTMENT_OTHER): Payer: Self-pay

## 2023-09-15 NOTE — Telephone Encounter (Signed)
 Patient notified

## 2023-09-15 NOTE — Telephone Encounter (Signed)
 Pharmacy Patient Advocate Encounter  Received notification from [facility] ADVANTAGE/RX ADVANCE that Prior Authorization for Nexlizet has been APPROVED from 09/15/23 to 03/17/24. Ran test claim, Copay is $12.15- one month. This test claim was processed through [facility]- copay amounts may vary at other pharmacies due to pharmacy/plan contracts, or as the patient moves through the different stages of their insurance plan.   PA #/Case ID/Reference #: [id number]

## 2023-09-20 ENCOUNTER — Other Ambulatory Visit: Payer: Self-pay

## 2023-09-20 ENCOUNTER — Other Ambulatory Visit (HOSPITAL_BASED_OUTPATIENT_CLINIC_OR_DEPARTMENT_OTHER): Payer: Self-pay

## 2023-09-21 ENCOUNTER — Other Ambulatory Visit (HOSPITAL_BASED_OUTPATIENT_CLINIC_OR_DEPARTMENT_OTHER): Payer: Self-pay

## 2023-09-21 MED FILL — Bempedoic Acid-Ezetimibe Tab 180-10 MG: ORAL | 30 days supply | Qty: 30 | Fill #1 | Status: AC

## 2023-09-22 ENCOUNTER — Other Ambulatory Visit: Payer: Self-pay

## 2023-09-23 DIAGNOSIS — Z9884 Bariatric surgery status: Secondary | ICD-10-CM | POA: Diagnosis not present

## 2023-09-23 DIAGNOSIS — I10 Essential (primary) hypertension: Secondary | ICD-10-CM | POA: Diagnosis not present

## 2023-09-23 DIAGNOSIS — E119 Type 2 diabetes mellitus without complications: Secondary | ICD-10-CM | POA: Diagnosis not present

## 2023-09-23 DIAGNOSIS — E66811 Obesity, class 1: Secondary | ICD-10-CM | POA: Diagnosis not present

## 2023-09-26 ENCOUNTER — Encounter (INDEPENDENT_AMBULATORY_CARE_PROVIDER_SITE_OTHER): Payer: Self-pay

## 2023-09-26 ENCOUNTER — Encounter (HOSPITAL_BASED_OUTPATIENT_CLINIC_OR_DEPARTMENT_OTHER): Payer: Self-pay | Admitting: Family Medicine

## 2023-09-26 ENCOUNTER — Ambulatory Visit (INDEPENDENT_AMBULATORY_CARE_PROVIDER_SITE_OTHER): Payer: Medicare Other | Admitting: Family Medicine

## 2023-09-26 ENCOUNTER — Other Ambulatory Visit: Payer: Self-pay

## 2023-09-26 ENCOUNTER — Other Ambulatory Visit (HOSPITAL_BASED_OUTPATIENT_CLINIC_OR_DEPARTMENT_OTHER): Payer: Self-pay

## 2023-09-26 VITALS — BP 121/73 | HR 67 | Ht 61.0 in | Wt 187.8 lb

## 2023-09-26 DIAGNOSIS — Z794 Long term (current) use of insulin: Secondary | ICD-10-CM

## 2023-09-26 DIAGNOSIS — M797 Fibromyalgia: Secondary | ICD-10-CM

## 2023-09-26 DIAGNOSIS — F5101 Primary insomnia: Secondary | ICD-10-CM

## 2023-09-26 DIAGNOSIS — E119 Type 2 diabetes mellitus without complications: Secondary | ICD-10-CM

## 2023-09-26 DIAGNOSIS — I10 Essential (primary) hypertension: Secondary | ICD-10-CM | POA: Diagnosis not present

## 2023-09-26 DIAGNOSIS — Z9884 Bariatric surgery status: Secondary | ICD-10-CM | POA: Diagnosis not present

## 2023-09-26 DIAGNOSIS — E113311 Type 2 diabetes mellitus with moderate nonproliferative diabetic retinopathy with macular edema, right eye: Secondary | ICD-10-CM | POA: Diagnosis not present

## 2023-09-26 LAB — POCT URINE DRUG SCREEN
Morphine: NOT DETECTED
POC Cocaine UR: NOT DETECTED
POC Marijuana UR: NOT DETECTED
POC Opiate Ur: NOT DETECTED
POC Oxycodone UR: NOT DETECTED

## 2023-09-26 MED ORDER — ZOLPIDEM TARTRATE 10 MG PO TABS
10.0000 mg | ORAL_TABLET | Freq: Every evening | ORAL | 3 refills | Status: DC | PRN
  Filled 2023-09-26: qty 30, 30d supply, fill #0

## 2023-09-26 MED ORDER — SHINGRIX 50 MCG/0.5ML IM SUSR
0.5000 mL | Freq: Once | INTRAMUSCULAR | 0 refills | Status: AC
  Filled 2023-09-26: qty 0.5, 1d supply, fill #0

## 2023-09-26 MED ORDER — TRAMADOL HCL 50 MG PO TABS
50.0000 mg | ORAL_TABLET | Freq: Two times a day (BID) | ORAL | 0 refills | Status: DC | PRN
  Filled 2023-09-26 – 2023-11-18 (×3): qty 30, 15d supply, fill #0

## 2023-09-26 NOTE — Progress Notes (Signed)
 Subjective:   Megan Murray [birth year] 09/26/2023  Chief Complaint  Patient presents with   Medical Management of Chronic Issues    69-month follow up; denies any main concerns for today's visit.    HPI: Megan Murray presents today for re-assessment and management of chronic medical conditions.   BARIATRIC SURGERY FOLLOW UP:  Patient had bariatric surgery (gastric roux en y)  approx. 1 year ago with [facility]. She is requesting labs to be drawn per recommendations of her surgeon. Patient is concerned about her thyroid function due to difficulty with losing weight at this time and feels as if she has hit a plateau in her progress. She is meeting with [facility] coming up to discuss nutrition guidance.    DIABETES MELLITUS: Megan Murray presents for the medical management of diabetes. [facility].  HLD is managed by Cardiology and patient sees yearly for Lipid check. Patient sees [facility] for DM eye exam today.   Current diabetes medication regimen: Humalog sliding scale, Tresiba 26 units daily Patient is  adhering to a diabetic diet.  Patient is  exercising regularly.  Patient is  checking BS regularly.  Patient is  checking their feet regularly.  Denies polydipsia, polyphagia, polyuria, open wounds or ulcers on feet.  Lab Results  Component Value Date   HGBA1C 6.8 (H) 09/08/2022    Foot Exam: 09/26/2023 Lab Results  Component Value Date   MICROALBUR 0.8 03/05/2020   MICROALBUR 1.0 06/21/2018    Wt Readings from Last 3 Encounters:  09/26/23 187 lb 12.8 oz (85.2 kg)  04/19/23 182 lb 9.6 oz (82.8 kg)  04/06/23 182 lb (82.6 kg)    HYPERTENSION: Megan Murray presents for the medical management of hypertension.  Patient's current hypertension medication regimen is: Losartan 25mg   Patient is  currently taking prescribed medications for HTN.  Patient is  regularly keeping a check on  BP at home.  Adhering to low sodium diet: Yes Exercising Regularly: yes, walkes 3-5 miles and lifting weights daily.  Denies headache, dizziness, CP, SHOB, vision changes.   BP Readings from Last 3 Encounters:  09/26/23 121/73  03/28/23 123/70  03/10/23 130/68   INSOMNIA: Megan Murray presents for the medical management of Insomnia. Patient reports control of insomnia currently with Zolpidem 10mg  PRN.   FIBROMYALGIA:  Patient currently taking Tramadol 50mg  PRN for chronic pain relief due to fibromyalgia. PDMP reviewed and refills appropriate. Patient needing refill of Tramadol today and will complete controlled substance agreement and UDS.   The following portions of the patient's history were reviewed and updated as appropriate: past medical history, past surgical history, family history, social history, allergies, medications, and problem list.   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   Anxiety    Pneumonia    PONV (postoperative nausea and vomiting)    Neck pain 02/14/2023   Primary insomnia 02/14/2023   Gastric bypass status for obesity 09/21/2022   Asthma 05/05/2022   Joint pain 05/05/2022   Pancreatic disease 05/05/2022   Vitamin D deficiency 01/20/2022   Prolonged QT interval 09/27/2021   Coronary artery disease moderate based on coronary CT angio of circumflex 04/29/2021   Obesity due to excess calories without serious comorbidity 02/19/2021   Myofascial pain 11/12/2019   Primary osteoarthritis of left knee 11/12/2019   Primary osteoarthritis of right knee 11/12/2019  Gastroesophageal reflux disease without esophagitis 04/26/2019   Seasonal allergic rhinitis due to pollen 05/19/2018   Fatty liver 05/16/2018   Fibromyalgia 04/25/2018   Major depression, recurrent, chronic (HCC) 04/25/2018   Osteoarthritis, multiple sites 04/25/2018   Hypertension associated with diabetes (HCC) 11/09/2016   Type 2 diabetes mellitus  without complication, with Azzaro-term current use of insulin (HCC) 11/09/2016   Essential hypertension 11/09/2016   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   Mixed hyperlipidemia 11/04/2016   Contusion of left wrist 07/29/2015   B12 deficiency 01/23/2015   Past Medical History:  Diagnosis Date   Anxiety    Asthma    B12 deficiency 01/23/2015   Contusion of left wrist 07/29/2015   Coronary artery disease moderate based on coronary CT angio of circumflex 04/29/2021   Essential hypertension 11/09/2016   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   Fatty liver 05/16/2018   By CT scan   Fatty liver    Fibromyalgia    Gastric bypass status for obesity 09/21/2022   Gastroesophageal reflux disease without esophagitis 04/26/2019   Hypertension associated with diabetes (HCC) 11/09/2016   Joint pain    Major depression, recurrent, chronic (HCC) 04/25/2018   Dr. Evelene Croon   Mixed hyperlipidemia 11/04/2016   Failed statins; hyperglycemia with repatha. Stopped 06/2018    Myofascial pain 11/12/2019   Neck pain 02/14/2023   Obesity due to excess calories without serious comorbidity    Osteoarthritis, multiple sites 04/25/2018   Pain in toe 08/20/2020   Pancreatic disease    Pneumonia    PONV (postoperative nausea and vomiting)    Primary insomnia 02/14/2023   Primary osteoarthritis of left knee 11/12/2019   Primary osteoarthritis of right knee 11/12/2019   Prolonged QT interval 09/27/2021   Seasonal allergic rhinitis due to pollen 05/19/2018   Statin intolerance 06/21/2018   Type 2 diabetes mellitus without complication, without Caraveo-term current use of insulin (HCC) 11/09/2016   Vitamin D deficiency    Past Surgical History:  Procedure Laterality Date   ABDOMINAL HYSTERECTOMY     CESAREAN SECTION  1994   GALLBLADDER SURGERY  1995   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   Procedure: LAPAROSCOPIC ROUX-EN-Y GASTRIC BYPASS WITH UPPER ENDOSCOPY, LYSIS OF ADHESIONS X 30 MIN;  Surgeon: Gaynelle Adu,  MD;  Location: [facility];  Service: General;  Laterality: N/A;   REDUCTION MAMMAPLASTY     Family History  Problem Relation Age of Onset   Heart disease Mother    Hypertension Mother    COPD Mother        Died at 72   Heart failure Mother        Not sure of details   Depression Mother    Liver disease Father    Esophageal cancer Father        Survived cancer treatment and surgery   Drug abuse Father    Cirrhosis Father        NAFLD   Diabetes Mellitus II Sister    Heart attack Sister    Diabetes Brother    Depression Brother    Heart attack Brother    Heart disease Brother    Diabetes Maternal Grandmother    Arthritis Maternal Grandmother    Diabetes Maternal Grandfather    Cancer Paternal Grandmother    Hearing loss Paternal Grandmother    Liver disease Paternal Grandfather    Outpatient Medications Prior to Visit  Medication Sig Dispense Refill   Bempedoic Acid-Ezetimibe (NEXLIZET) 180-10 MG TABS Take 1 tablet by mouth daily. 90 tablet 0  brimonidine (ALPHAGAN P) 0.1 % SOLN Place 1 drop into the right eye 2 (two) times daily. 5 mL 5   Continuous Blood Gluc Receiver (FREESTYLE LIBRE 2 READER) DEVI Use as directed 1 each 0   Continuous Glucose Receiver (FREESTYLE LIBRE 3 READER) DEVI Use as directed to check blood sugar 1 each 0   Continuous Glucose Sensor (FREESTYLE LIBRE 2 SENSOR) MISC Use 1 sensor as directed to check blood sugar. Change ever 14 days 2 each 5   Continuous Glucose Sensor (FREESTYLE LIBRE 3 PLUS SENSOR) MISC USe as directed to check blood sugar. Change every 15 days. 2 each 5   FLUoxetine (PROZAC) 40 MG capsule Take 1 capsule (40 mg total) by mouth daily. 90 capsule 3   fluticasone-salmeterol (WIXELA INHUB) 250-50 MCG/ACT AEPB Inhale 1 puff into the lungs 2 (two) times daily. 180 each 0   Glucagon (BAQSIMI TWO PACK) 3 MG/DOSE POWD Use nasally as directed as needed 2 each 11   glucose blood (ONETOUCH ULTRA TEST) test strip Use as instructed 100 strip 12    insulin lispro (HUMALOG KWIKPEN) 100 UNIT/ML KwikPen Inject 6 Units into the skin 3 (three) times daily. 15 mL 11   insulin lispro (HUMALOG KWIKPEN) 100 UNIT/ML KwikPen Inject 3 Units into the skin 3 (three) times daily. 3 mL 5   levocetirizine (XYZAL) 5 MG tablet Take 1 tablet (5 mg total) by mouth every evening. 30 tablet 4   losartan (COZAAR) 25 MG tablet Take 1 tablet (25 mg total) by mouth daily. 90 tablet 2   neomycin-polymyxin-dexameth (MAXITROL) 0.1 % OINT Administer small amount into left eye twice daily for 3 days. 3.5 g 0   timolol (TIMOPTIC) 0.5 % ophthalmic solution Place 1 drop into the right eye 2 (two) times daily. 5 mL 5   TRESIBA FLEXTOUCH 200 UNIT/ML FlexTouch Pen Inject  25 units in the morning and 15 units at night 3 mL 2   zolpidem (AMBIEN) 10 MG tablet TAKE 1 TABLET BY MOUTH EVERY NIGHT AT BEDTIME AS NEEDED FOR INSOMNIA (Patient taking differently: Take 10 mg by mouth at bedtime.) 30 tablet 3   traMADol (ULTRAM) 50 MG tablet Take 1 tablet (50 mg total) by mouth 2 (two) times daily as needed for pain. 30 tablet 0   No facility-administered medications prior to visit.   Allergies  Allergen Reactions   Codeine Palpitations    Natural or Synthetic Codeine   Atorvastatin Nausea Only and Other (See Comments)    Myalgias   Cholestyramine Diarrhea and Nausea Only   Crestor [Rosuvastatin Calcium] Nausea Only and Other (See Comments)    Myalgias   Fenofibrate Diarrhea and Nausea And Vomiting   Simvastatin Nausea Only and Other (See Comments)    Myalgias   Soy Allergy (Obsolete) Diarrhea and Nausea And Vomiting   Statins Nausea Only and Other (See Comments)     Myalgias   Ozempic (0.25 Or 0.5 Mg-Dose) [Semaglutide(0.25 Or 0.5mg -Dos)]     Caused pancreatitis   Repatha [Evolocumab] Other (See Comments)    flu   Synjardy [Empagliflozin-Metformin Hcl] Other (See Comments)    Caused pancreatitis   Trulicity [Dulaglutide] Other (See Comments)    Pancreatitis    Meloxicam Rash      ROS: A complete ROS was performed with pertinent positives/negatives noted in the HPI. The remainder of the ROS are negative.    Objective:   Today's Vitals   09/26/23 0819  BP: 121/73  Pulse: 67  SpO2: 99%  Weight: 187  lb 12.8 oz (85.2 kg)  Height: 5\' 1"  (1.549 m)    Physical Exam          GENERAL: Well-appearing, in NAD. Well nourished.  SKIN: Pink, warm and dry.  Head: Normocephalic. NECK: Trachea midline. Full ROM w/o pain or tenderness. No Thyromegaly or palpable nodules.  EYES: Conjunctiva clear without exudates. EOMI, PERRL, no drainage present.  RESPIRATORY: Chest wall symmetrical. Respirations even and non-labored. Breath sounds clear to auscultation bilaterally.  CARDIAC: S1, S2 present, regular rate and rhythm without murmur or gallops. Peripheral pulses 2+ bilaterally.  MSK: Muscle tone and strength appropriate for age.  NEUROLOGIC: No motor or sensory deficits. Steady, even gait. C2-C12 intact.  PSYCH/MENTAL STATUS: Alert, oriented x 3. Cooperative, appropriate mood and affect.   Diabetic Foot Exam - Simple   Simple Foot Form Diabetic Foot exam was performed with the following findings: Yes 09/26/2023  8:56 AM  Visual Inspection No deformities, no ulcerations, no other skin breakdown bilaterally: Yes Sensation Testing Intact to touch and monofilament testing bilaterally: Yes Pulse Check Posterior Tibialis and Dorsalis pulse intact bilaterally: Yes Comments     Results for orders placed or performed in visit on 09/26/23  POCT Urine Drug Screen   Collection Time: 09/26/23  9:05 AM  Result Value Ref Range   POC Methamphetamine UR     POC Opiate Ur None Detected None Detected   POC Barbiturate UR     POC Amphetamine UR     POC Oxycodone UR None Detected None Detected   POC Cocaine UR None Detected None Detected   POC Ecstasy UR     POC TRICYCLICS UR     POC PHENCYCLIDINE UR     POC Marijuana UR None Detected None Detected   POC Methadone UR      POC BENZODIAZEPINES UR     URINE TEMPERATURE     POC DRUG SCREEN OXIDANTS URINE     POC SPECIFIC GRAVITY URINE     POC PH URINE     Methylenedioxyamphetamine       Morphine none detected     Health Maintenance Due  Topic Date Due   Zoster Vaccines- Shingrix (2 of 2) 01/05/2021   Diabetic kidney evaluation - Urine ACR  03/05/2021   OPHTHALMOLOGY EXAM  01/02/2022   HEMOGLOBIN A1C  03/09/2023      Assessment & Plan:  1. Type 2 diabetes mellitus without complication, with Pata-term current use of insulin (HCC) (Primary) Well-controlled and managed by endocrinology.  Will check A1c and CMP per patient request and she will provide lab results to endocrinology at [facility].  Foot exam completed today.  Patient states she will complete urine albumin at her next appointment with Endo.  Patient doing well with diet, exercise and monitoring glucose. - Comprehensive metabolic panel - Hemoglobin A1c  2. Essential hypertension Well-controlled.  Continue losartan as directed, regular exercise and management of diet.  3. Gastric bypass status for obesity Patient doing well postsurgery with lifestyle changes, nutrition and regular exercise.  Will obtain lab work per patient request for follow-up of gastric bypass history that were requested by [facility] surgery..  Will also obtain TSH per patient request due to difficulty with losing weight per patient with dietary changes and regular exercise.  - CBC with Differential/Platelet - Comprehensive metabolic panel - Vitamin B12 - Iron, TIBC and Ferritin Panel - Folate - Vitamin D 1,25 dihydroxy - Vitamin B1 - TSH Rfx on Abnormal to Free T4  4.  Primary insomnia Controlled.  Continue zolpidem 10 mg as needed.  Safe use of medication reviewed with patient.  PDMP reviewed - zolpidem (AMBIEN) 10 MG tablet; Take 1 tablet (10 mg total) by mouth at bedtime as needed for sleep.  Dispense: 30 tablet; Refill: 3  5.  Fibromyalgia Controlled.  Continue tramadol 50 mg as needed.  PDMP reviewed and safe use of medication reviewed with patient.  UDS negative.  Controlled substance agreement updated - traMADol (ULTRAM) 50 MG tablet; Take 1 tablet (50 mg total) by mouth 2 (two) times daily as needed for pain.  Dispense: 30 tablet; Refill: 0 - POCT Urine Drug Screen    Meds ordered this encounter  Medications   zolpidem (AMBIEN) 10 MG tablet    Sig: Take 1 tablet (10 mg total) by mouth at bedtime as needed for sleep.    Dispense:  30 tablet    Refill:  3    Supervising Provider:   DE [place], RAYMOND J [[id number]]   traMADol (ULTRAM) 50 MG tablet    Sig: Take 1 tablet (50 mg total) by mouth 2 (two) times daily as needed for pain.    Dispense:  30 tablet    Refill:  0    Supervising Provider:   DE [place], RAYMOND J [[id number]]   Lab Orders         CBC with Differential/Platelet         Comprehensive metabolic panel         Vitamin B12         Iron, TIBC and Ferritin Panel         Folate         Vitamin D 1,25 dihydroxy         Vitamin B1         Hemoglobin A1c         TSH Rfx on Abnormal to Free T4         POCT Urine Drug Screen      Return in about 6 months (around 03/28/2024).    Patient to reach out to office if new, worrisome, or unresolved symptoms arise or if no improvement in patient's condition. Patient verbalized understanding and is agreeable to treatment plan. All questions answered to patient's satisfaction.    Hilbert Bible, [place]

## 2023-09-26 NOTE — Patient Instructions (Signed)
 Please get 2nd Shingrix in Pharmacy

## 2023-09-27 ENCOUNTER — Other Ambulatory Visit: Payer: Self-pay

## 2023-09-29 DIAGNOSIS — E7800 Pure hypercholesterolemia, unspecified: Secondary | ICD-10-CM | POA: Diagnosis not present

## 2023-09-29 DIAGNOSIS — E139 Other specified diabetes mellitus without complications: Secondary | ICD-10-CM | POA: Diagnosis not present

## 2023-10-01 ENCOUNTER — Other Ambulatory Visit (HOSPITAL_BASED_OUTPATIENT_CLINIC_OR_DEPARTMENT_OTHER): Payer: Self-pay

## 2023-10-05 ENCOUNTER — Encounter: Admitting: Family Medicine

## 2023-10-06 ENCOUNTER — Encounter (HOSPITAL_BASED_OUTPATIENT_CLINIC_OR_DEPARTMENT_OTHER): Payer: Self-pay | Admitting: Family Medicine

## 2023-10-06 ENCOUNTER — Encounter (HOSPITAL_BASED_OUTPATIENT_CLINIC_OR_DEPARTMENT_OTHER): Payer: Self-pay | Admitting: *Deleted

## 2023-10-07 ENCOUNTER — Other Ambulatory Visit (HOSPITAL_BASED_OUTPATIENT_CLINIC_OR_DEPARTMENT_OTHER): Payer: Self-pay

## 2023-10-11 ENCOUNTER — Encounter (HOSPITAL_BASED_OUTPATIENT_CLINIC_OR_DEPARTMENT_OTHER): Payer: Self-pay | Admitting: Family Medicine

## 2023-10-11 ENCOUNTER — Other Ambulatory Visit (HOSPITAL_BASED_OUTPATIENT_CLINIC_OR_DEPARTMENT_OTHER): Payer: Self-pay

## 2023-10-11 ENCOUNTER — Other Ambulatory Visit: Payer: Self-pay

## 2023-10-11 DIAGNOSIS — Z9884 Bariatric surgery status: Secondary | ICD-10-CM | POA: Diagnosis not present

## 2023-10-11 DIAGNOSIS — E7800 Pure hypercholesterolemia, unspecified: Secondary | ICD-10-CM | POA: Diagnosis not present

## 2023-10-11 DIAGNOSIS — E139 Other specified diabetes mellitus without complications: Secondary | ICD-10-CM | POA: Diagnosis not present

## 2023-10-11 DIAGNOSIS — I10 Essential (primary) hypertension: Secondary | ICD-10-CM | POA: Diagnosis not present

## 2023-10-11 MED ORDER — FREESTYLE LIBRE 2 PLUS SENSOR MISC
5 refills | Status: DC
  Filled 2023-10-11: qty 2, 30d supply, fill #0
  Filled 2023-11-01 – 2023-11-08 (×2): qty 2, 30d supply, fill #1
  Filled 2023-12-05: qty 2, 30d supply, fill #2

## 2023-10-11 MED ORDER — OMNIPOD 5 LIBRE2 PLUS G6 KIT
PACK | 0 refills | Status: AC
  Filled 2023-10-11: qty 1, 90d supply, fill #0
  Filled 2023-10-11: qty 1, 30d supply, fill #0

## 2023-10-11 MED ORDER — OMNIPOD 5 LIBRE2 PLUS G6 PODS MISC
5 refills | Status: AC
  Filled 2023-10-11: qty 10, 30d supply, fill #0
  Filled 2023-11-08: qty 10, 30d supply, fill #1
  Filled 2023-12-07: qty 10, 30d supply, fill #2
  Filled 2024-06-30: qty 10, 30d supply, fill #3

## 2023-10-12 ENCOUNTER — Other Ambulatory Visit (HOSPITAL_BASED_OUTPATIENT_CLINIC_OR_DEPARTMENT_OTHER): Payer: Self-pay

## 2023-10-13 ENCOUNTER — Other Ambulatory Visit (HOSPITAL_BASED_OUTPATIENT_CLINIC_OR_DEPARTMENT_OTHER): Payer: Self-pay

## 2023-10-13 LAB — CBC WITH DIFFERENTIAL/PLATELET
Basophils Absolute: 0 10*3/uL (ref 0.0–0.2)
Basos: 1 %
EOS (ABSOLUTE): 0.2 10*3/uL (ref 0.0–0.4)
Eos: 2 %
Hematocrit: 45.3 % (ref 34.0–46.6)
Hemoglobin: 14.9 g/dL (ref 11.1–15.9)
Immature Grans (Abs): 0 10*3/uL (ref 0.0–0.1)
Immature Granulocytes: 0 %
Lymphocytes Absolute: 2.8 10*3/uL (ref 0.7–3.1)
Lymphs: 38 %
MCH: 29.6 pg (ref 26.6–33.0)
MCHC: 32.9 g/dL (ref 31.5–35.7)
MCV: 90 fL (ref 79–97)
Monocytes Absolute: 0.5 10*3/uL (ref 0.1–0.9)
Monocytes: 6 %
Neutrophils Absolute: 3.8 10*3/uL (ref 1.4–7.0)
Neutrophils: 53 %
Platelets: 217 10*3/uL (ref 150–450)
RBC: 5.04 x10E6/uL (ref 3.77–5.28)
RDW: 12.5 % (ref 11.7–15.4)
WBC: 7.3 10*3/uL (ref 3.4–10.8)

## 2023-10-13 LAB — HEMOGLOBIN A1C
Est. average glucose Bld gHb Est-mCnc: 120 mg/dL
Hgb A1c MFr Bld: 5.8 % — ABNORMAL HIGH (ref 4.8–5.6)

## 2023-10-13 LAB — COMPREHENSIVE METABOLIC PANEL WITH GFR
ALT: 20 IU/L (ref 0–32)
AST: 14 IU/L (ref 0–40)
Albumin: 4.2 g/dL (ref 3.8–4.9)
Alkaline Phosphatase: 116 IU/L (ref 44–121)
BUN/Creatinine Ratio: 24 — ABNORMAL HIGH (ref 9–23)
BUN: 12 mg/dL (ref 6–24)
Bilirubin Total: 0.4 mg/dL (ref 0.0–1.2)
CO2: 21 mmol/L (ref 20–29)
Calcium: 9.2 mg/dL (ref 8.7–10.2)
Chloride: 103 mmol/L (ref 96–106)
Creatinine, Ser: 0.49 mg/dL — ABNORMAL LOW (ref 0.57–1.00)
Globulin, Total: 2.5 g/dL (ref 1.5–4.5)
Glucose: 138 mg/dL — ABNORMAL HIGH (ref 70–99)
Potassium: 4.2 mmol/L (ref 3.5–5.2)
Sodium: 141 mmol/L (ref 134–144)
Total Protein: 6.7 g/dL (ref 6.0–8.5)
eGFR: 109 mL/min/{1.73_m2} (ref >59)

## 2023-10-13 LAB — VITAMIN D 1,25 DIHYDROXY
Vitamin D 1, 25 (OH)2 Total: 76 pg/mL — ABNORMAL HIGH
Vitamin D2 1, 25 (OH)2: <10 pg/mL
Vitamin D3 1, 25 (OH)2: 73 pg/mL

## 2023-10-13 LAB — TSH RFX ON ABNORMAL TO FREE T4: TSH: 1.66 u[IU]/mL (ref 0.450–4.500)

## 2023-10-13 LAB — IRON,TIBC AND FERRITIN PANEL
Ferritin: 110 ng/mL (ref 15–150)
Iron Saturation: 23 % (ref 15–55)
Iron: 79 ug/dL (ref 27–159)
Total Iron Binding Capacity: 346 ug/dL (ref 250–450)
UIBC: 267 ug/dL (ref 131–425)

## 2023-10-13 LAB — FOLATE: Folate: 11.1 ng/mL (ref >3.0)

## 2023-10-13 LAB — VITAMIN B1: Thiamine: 166.5 nmol/L (ref 66.5–200.0)

## 2023-10-13 LAB — VITAMIN B12: Vitamin B-12: 764 pg/mL (ref 232–1245)

## 2023-10-17 ENCOUNTER — Encounter: Admitting: Family Medicine

## 2023-10-18 ENCOUNTER — Other Ambulatory Visit (HOSPITAL_BASED_OUTPATIENT_CLINIC_OR_DEPARTMENT_OTHER): Payer: Self-pay

## 2023-10-18 MED ORDER — INSULIN LISPRO (1 UNIT DIAL) 100 UNIT/ML (KWIKPEN)
3.0000 [IU] | PEN_INJECTOR | Freq: Three times a day (TID) | SUBCUTANEOUS | 5 refills | Status: DC
Start: 2023-10-18 — End: 2024-04-19
  Filled 2023-10-18: qty 9, 100d supply, fill #0
  Filled 2024-02-09: qty 9, 84d supply, fill #0

## 2023-10-20 ENCOUNTER — Other Ambulatory Visit (HOSPITAL_BASED_OUTPATIENT_CLINIC_OR_DEPARTMENT_OTHER): Payer: Self-pay

## 2023-10-20 ENCOUNTER — Telehealth: Payer: Self-pay | Admitting: Cardiology

## 2023-10-20 NOTE — Telephone Encounter (Signed)
 Patient called back and was informed that Dr. Tomie China preferred for her to have an in office appointment instead of a telehealth appointment. Patient stated that she just started a new job and was unable to get time off for an in office appointment at this time. I explained that we could reschedule her appointment for a later date and she preferred to cancel the current appointment and reschedule it at a later time. Patient had no further questions at this time.

## 2023-10-20 NOTE — Telephone Encounter (Signed)
 Patient is scheduled with Dr. Tomie China on 11/08/23 at 9:20 AM. Patient would like to know if Dr. Tomie China would do a televisit with her instead of coming into the office. Please advise.

## 2023-10-20 NOTE — Telephone Encounter (Signed)
 Spoke to Dr. Tomie China about doing a telehealth visit with this patient and he preferred that the patient come into the office to have her appointment with him.

## 2023-10-20 NOTE — Telephone Encounter (Signed)
 Left message for the patient to call back.

## 2023-10-21 ENCOUNTER — Other Ambulatory Visit (HOSPITAL_BASED_OUTPATIENT_CLINIC_OR_DEPARTMENT_OTHER): Payer: Self-pay

## 2023-10-21 MED FILL — Bempedoic Acid-Ezetimibe Tab 180-10 MG: ORAL | 30 days supply | Qty: 30 | Fill #2 | Status: CN

## 2023-10-22 ENCOUNTER — Other Ambulatory Visit (HOSPITAL_BASED_OUTPATIENT_CLINIC_OR_DEPARTMENT_OTHER): Payer: Self-pay

## 2023-10-24 ENCOUNTER — Other Ambulatory Visit (HOSPITAL_BASED_OUTPATIENT_CLINIC_OR_DEPARTMENT_OTHER): Payer: Self-pay

## 2023-10-24 ENCOUNTER — Encounter (HOSPITAL_BASED_OUTPATIENT_CLINIC_OR_DEPARTMENT_OTHER): Payer: Self-pay | Admitting: Family Medicine

## 2023-10-24 MED ORDER — INSULIN LISPRO 100 UNIT/ML IJ SOLN
100.0000 [IU] | Freq: Every day | INTRAMUSCULAR | 5 refills | Status: DC
Start: 2023-10-24 — End: 2024-03-20
  Filled 2023-11-18 (×2): qty 30, 30d supply, fill #0
  Filled 2024-02-07: qty 30, 30d supply, fill #1

## 2023-10-24 MED ORDER — INSULIN LISPRO 100 UNIT/ML IJ SOLN
100.0000 [IU] | Freq: Every day | INTRAMUSCULAR | 5 refills | Status: DC
  Filled 2023-10-24: qty 30, 30d supply, fill #0

## 2023-10-24 NOTE — Progress Notes (Signed)
 Megan Murray,  I apologize for the delay in reviewing your labs.  Your electrolytes and liver function is well controlled. Please make sure you continue to drink water frequently to support healthy kidney function. Your Vitamin D1 level is still slightly elevated from prior check one year ago. Your Vitamin d3 is at a healthy level. You may need to decrease your Vitamin D supplementation or frequency of supplement depending on how much you are taking. Please let me know your dosage and I will be happy to guide you in dosing. Your A1C is excellent at 5.8 and your blood counts are normal. No signs of anemia. Your B12, and iron panel results are normal. Your thiamine and thyroid function are normal as well. Please let me know if you have any questions.

## 2023-10-26 DIAGNOSIS — I10 Essential (primary) hypertension: Secondary | ICD-10-CM | POA: Diagnosis not present

## 2023-10-26 DIAGNOSIS — Z4681 Encounter for fitting and adjustment of insulin pump: Secondary | ICD-10-CM | POA: Diagnosis not present

## 2023-10-26 DIAGNOSIS — E139 Other specified diabetes mellitus without complications: Secondary | ICD-10-CM | POA: Diagnosis not present

## 2023-10-26 DIAGNOSIS — E7800 Pure hypercholesterolemia, unspecified: Secondary | ICD-10-CM | POA: Diagnosis not present

## 2023-10-27 NOTE — Telephone Encounter (Signed)
 Alexis, please see the supplements listed by pt that she takes.

## 2023-11-01 ENCOUNTER — Other Ambulatory Visit (HOSPITAL_BASED_OUTPATIENT_CLINIC_OR_DEPARTMENT_OTHER): Payer: Self-pay

## 2023-11-08 ENCOUNTER — Ambulatory Visit: Admitting: Cardiology

## 2023-11-08 ENCOUNTER — Other Ambulatory Visit: Payer: Self-pay

## 2023-11-08 ENCOUNTER — Other Ambulatory Visit (HOSPITAL_BASED_OUTPATIENT_CLINIC_OR_DEPARTMENT_OTHER): Payer: Self-pay

## 2023-11-09 ENCOUNTER — Other Ambulatory Visit (HOSPITAL_BASED_OUTPATIENT_CLINIC_OR_DEPARTMENT_OTHER): Payer: Self-pay

## 2023-11-09 MED ORDER — INSULIN LISPRO 100 UNIT/ML IJ SOLN
100.0000 [IU] | Freq: Every day | INTRAMUSCULAR | 5 refills | Status: DC
Start: 2023-11-09 — End: 2024-03-20

## 2023-11-18 ENCOUNTER — Other Ambulatory Visit (HOSPITAL_BASED_OUTPATIENT_CLINIC_OR_DEPARTMENT_OTHER): Payer: Self-pay

## 2023-11-18 ENCOUNTER — Other Ambulatory Visit (HOSPITAL_BASED_OUTPATIENT_CLINIC_OR_DEPARTMENT_OTHER): Payer: Self-pay | Admitting: Family Medicine

## 2023-11-18 DIAGNOSIS — F5101 Primary insomnia: Secondary | ICD-10-CM

## 2023-11-21 ENCOUNTER — Other Ambulatory Visit (HOSPITAL_BASED_OUTPATIENT_CLINIC_OR_DEPARTMENT_OTHER): Payer: Self-pay

## 2023-11-21 MED ORDER — ZOLPIDEM TARTRATE 10 MG PO TABS
10.0000 mg | ORAL_TABLET | Freq: Every day | ORAL | 3 refills | Status: DC
  Filled 2023-11-21: qty 30, 30d supply, fill #0
  Filled 2023-12-18: qty 30, 30d supply, fill #1
  Filled 2024-01-18: qty 30, 30d supply, fill #2
  Filled 2024-02-17: qty 30, 30d supply, fill #3

## 2023-11-29 DIAGNOSIS — E139 Other specified diabetes mellitus without complications: Secondary | ICD-10-CM | POA: Diagnosis not present

## 2023-11-29 DIAGNOSIS — E7800 Pure hypercholesterolemia, unspecified: Secondary | ICD-10-CM | POA: Diagnosis not present

## 2023-11-29 DIAGNOSIS — I10 Essential (primary) hypertension: Secondary | ICD-10-CM | POA: Diagnosis not present

## 2023-11-29 DIAGNOSIS — Z4681 Encounter for fitting and adjustment of insulin pump: Secondary | ICD-10-CM | POA: Diagnosis not present

## 2023-12-06 ENCOUNTER — Other Ambulatory Visit (HOSPITAL_BASED_OUTPATIENT_CLINIC_OR_DEPARTMENT_OTHER): Payer: Self-pay

## 2023-12-07 ENCOUNTER — Other Ambulatory Visit (HOSPITAL_BASED_OUTPATIENT_CLINIC_OR_DEPARTMENT_OTHER): Payer: Self-pay

## 2023-12-19 ENCOUNTER — Other Ambulatory Visit: Payer: Self-pay

## 2023-12-19 DIAGNOSIS — E113312 Type 2 diabetes mellitus with moderate nonproliferative diabetic retinopathy with macular edema, left eye: Secondary | ICD-10-CM | POA: Diagnosis not present

## 2023-12-21 ENCOUNTER — Other Ambulatory Visit (HOSPITAL_BASED_OUTPATIENT_CLINIC_OR_DEPARTMENT_OTHER): Payer: Self-pay

## 2023-12-21 ENCOUNTER — Other Ambulatory Visit: Payer: Self-pay

## 2023-12-27 ENCOUNTER — Other Ambulatory Visit (HOSPITAL_BASED_OUTPATIENT_CLINIC_OR_DEPARTMENT_OTHER): Payer: Self-pay

## 2024-01-06 ENCOUNTER — Other Ambulatory Visit: Payer: Self-pay | Admitting: Medical

## 2024-01-06 ENCOUNTER — Other Ambulatory Visit (HOSPITAL_BASED_OUTPATIENT_CLINIC_OR_DEPARTMENT_OTHER): Payer: Self-pay

## 2024-01-06 MED ORDER — INSULIN LISPRO (1 UNIT DIAL) 100 UNIT/ML (KWIKPEN)
3.0000 [IU] | PEN_INJECTOR | Freq: Three times a day (TID) | SUBCUTANEOUS | 5 refills | Status: DC
  Filled 2024-01-06 – 2024-01-11 (×3): qty 3, 34d supply, fill #0
  Filled 2024-03-27 – 2024-03-28 (×2): qty 3, 34d supply, fill #1
  Filled ????-??-??: fill #1

## 2024-01-06 MED FILL — Levocetirizine Dihydrochloride Tab 5 MG: ORAL | 30 days supply | Qty: 30 | Fill #0 | Status: AC

## 2024-01-09 ENCOUNTER — Other Ambulatory Visit (HOSPITAL_BASED_OUTPATIENT_CLINIC_OR_DEPARTMENT_OTHER): Payer: Self-pay

## 2024-01-10 ENCOUNTER — Other Ambulatory Visit (HOSPITAL_BASED_OUTPATIENT_CLINIC_OR_DEPARTMENT_OTHER): Payer: Self-pay

## 2024-01-11 ENCOUNTER — Other Ambulatory Visit (HOSPITAL_BASED_OUTPATIENT_CLINIC_OR_DEPARTMENT_OTHER): Payer: Self-pay

## 2024-01-11 ENCOUNTER — Other Ambulatory Visit: Payer: Self-pay

## 2024-01-11 MED ORDER — TECHLITE PEN NEEDLES 32G X 4 MM MISC
5 refills | Status: AC
  Filled 2024-01-11: qty 400, 90d supply, fill #0
  Filled 2024-04-10: qty 400, 90d supply, fill #1

## 2024-01-19 ENCOUNTER — Other Ambulatory Visit: Payer: Self-pay

## 2024-01-23 DIAGNOSIS — E113311 Type 2 diabetes mellitus with moderate nonproliferative diabetic retinopathy with macular edema, right eye: Secondary | ICD-10-CM | POA: Diagnosis not present

## 2024-01-25 ENCOUNTER — Other Ambulatory Visit (HOSPITAL_BASED_OUTPATIENT_CLINIC_OR_DEPARTMENT_OTHER): Payer: Self-pay

## 2024-02-01 ENCOUNTER — Other Ambulatory Visit (HOSPITAL_BASED_OUTPATIENT_CLINIC_OR_DEPARTMENT_OTHER): Payer: Self-pay

## 2024-02-07 ENCOUNTER — Other Ambulatory Visit (HOSPITAL_BASED_OUTPATIENT_CLINIC_OR_DEPARTMENT_OTHER): Payer: Self-pay | Admitting: Family Medicine

## 2024-02-07 ENCOUNTER — Other Ambulatory Visit: Payer: Self-pay

## 2024-02-07 ENCOUNTER — Other Ambulatory Visit (HOSPITAL_BASED_OUTPATIENT_CLINIC_OR_DEPARTMENT_OTHER): Payer: Self-pay

## 2024-02-07 DIAGNOSIS — M797 Fibromyalgia: Secondary | ICD-10-CM

## 2024-02-07 MED ORDER — TRAMADOL HCL 50 MG PO TABS
50.0000 mg | ORAL_TABLET | Freq: Two times a day (BID) | ORAL | 0 refills | Status: DC | PRN
  Filled 2024-02-07: qty 30, 15d supply, fill #0

## 2024-02-07 MED FILL — Levocetirizine Dihydrochloride Tab 5 MG: ORAL | 30 days supply | Qty: 30 | Fill #1 | Status: AC

## 2024-02-09 ENCOUNTER — Other Ambulatory Visit (HOSPITAL_BASED_OUTPATIENT_CLINIC_OR_DEPARTMENT_OTHER): Payer: Self-pay

## 2024-02-10 ENCOUNTER — Ambulatory Visit (HOSPITAL_BASED_OUTPATIENT_CLINIC_OR_DEPARTMENT_OTHER): Admitting: Orthopaedic Surgery

## 2024-02-17 ENCOUNTER — Other Ambulatory Visit: Payer: Self-pay

## 2024-02-21 ENCOUNTER — Other Ambulatory Visit (HOSPITAL_BASED_OUTPATIENT_CLINIC_OR_DEPARTMENT_OTHER): Payer: Self-pay

## 2024-02-21 ENCOUNTER — Other Ambulatory Visit: Payer: Self-pay

## 2024-02-21 ENCOUNTER — Other Ambulatory Visit (HOSPITAL_BASED_OUTPATIENT_CLINIC_OR_DEPARTMENT_OTHER): Payer: Self-pay | Admitting: Family Medicine

## 2024-02-21 DIAGNOSIS — M797 Fibromyalgia: Secondary | ICD-10-CM

## 2024-02-24 ENCOUNTER — Other Ambulatory Visit (HOSPITAL_BASED_OUTPATIENT_CLINIC_OR_DEPARTMENT_OTHER): Payer: Self-pay

## 2024-02-24 MED ORDER — TRAMADOL HCL 50 MG PO TABS
50.0000 mg | ORAL_TABLET | Freq: Two times a day (BID) | ORAL | 0 refills | Status: DC | PRN
  Filled 2024-02-24: qty 30, 15d supply, fill #0

## 2024-02-27 DIAGNOSIS — H2513 Age-related nuclear cataract, bilateral: Secondary | ICD-10-CM | POA: Diagnosis not present

## 2024-02-27 DIAGNOSIS — E113313 Type 2 diabetes mellitus with moderate nonproliferative diabetic retinopathy with macular edema, bilateral: Secondary | ICD-10-CM | POA: Diagnosis not present

## 2024-02-27 DIAGNOSIS — H3582 Retinal ischemia: Secondary | ICD-10-CM | POA: Diagnosis not present

## 2024-03-05 ENCOUNTER — Other Ambulatory Visit: Payer: Self-pay

## 2024-03-05 ENCOUNTER — Other Ambulatory Visit (HOSPITAL_BASED_OUTPATIENT_CLINIC_OR_DEPARTMENT_OTHER): Payer: Self-pay

## 2024-03-05 MED FILL — Levocetirizine Dihydrochloride Tab 5 MG: ORAL | 30 days supply | Qty: 30 | Fill #2 | Status: AC

## 2024-03-14 ENCOUNTER — Other Ambulatory Visit (HOSPITAL_BASED_OUTPATIENT_CLINIC_OR_DEPARTMENT_OTHER): Payer: Self-pay | Admitting: Family Medicine

## 2024-03-14 DIAGNOSIS — F5101 Primary insomnia: Secondary | ICD-10-CM

## 2024-03-15 ENCOUNTER — Other Ambulatory Visit: Payer: Self-pay

## 2024-03-15 ENCOUNTER — Other Ambulatory Visit (HOSPITAL_BASED_OUTPATIENT_CLINIC_OR_DEPARTMENT_OTHER): Payer: Self-pay

## 2024-03-15 MED ORDER — ZOLPIDEM TARTRATE 10 MG PO TABS
10.0000 mg | ORAL_TABLET | Freq: Every day | ORAL | 3 refills | Status: DC
  Filled 2024-03-15: qty 30, 30d supply, fill #0
  Filled 2024-05-15: qty 30, 30d supply, fill #1
  Filled 2024-06-14: qty 30, 30d supply, fill #2
  Filled 2024-07-09 – 2024-07-13 (×2): qty 30, 30d supply, fill #3

## 2024-03-20 ENCOUNTER — Other Ambulatory Visit (HOSPITAL_BASED_OUTPATIENT_CLINIC_OR_DEPARTMENT_OTHER): Payer: Self-pay

## 2024-03-20 ENCOUNTER — Ambulatory Visit (HOSPITAL_BASED_OUTPATIENT_CLINIC_OR_DEPARTMENT_OTHER): Admitting: Family Medicine

## 2024-03-20 ENCOUNTER — Encounter (HOSPITAL_BASED_OUTPATIENT_CLINIC_OR_DEPARTMENT_OTHER): Payer: Self-pay | Admitting: Family Medicine

## 2024-03-20 VITALS — BP 136/71 | HR 83 | Ht 61.0 in | Wt 188.5 lb

## 2024-03-20 DIAGNOSIS — R051 Acute cough: Secondary | ICD-10-CM

## 2024-03-20 DIAGNOSIS — J069 Acute upper respiratory infection, unspecified: Secondary | ICD-10-CM | POA: Diagnosis not present

## 2024-03-20 LAB — POC COVID19 BINAXNOW: SARS Coronavirus 2 Ag: NEGATIVE

## 2024-03-20 LAB — POCT INFLUENZA A/B
Influenza A, POC: NEGATIVE
Influenza B, POC: NEGATIVE

## 2024-03-20 MED ORDER — PROMETHAZINE-DM 6.25-15 MG/5ML PO SYRP
5.0000 mL | ORAL_SOLUTION | Freq: Four times a day (QID) | ORAL | 0 refills | Status: DC | PRN
  Filled 2024-03-20: qty 118, 6d supply, fill #0

## 2024-03-20 NOTE — Progress Notes (Signed)
    Procedures performed today:    None.  Independent interpretation of notes and tests performed by another provider:   None.  Brief History, Exam, Impression, and Recommendations:    BP 136/71 (BP Location: Left Arm, Patient Position: Sitting, Cuff Size: Normal)   Pulse 83   Ht 5' 1 (1.549 m)   Wt 188 lb 8 oz (85.5 kg)   SpO2 97%   BMI 35.62 kg/m   Discussed the use of AI scribe software for clinical note transcription with the patient, who gave verbal consent to proceed.  History of Present Illness Megan Murray is a 58 year old female who presents with sinus pressure, sore throat, and ear discomfort.  Symptoms began suddenly on Friday afternoon with sinus pressure, sore throat, and sinus drainage, which have progressively worsened. She now experiences a low-grade fever and significant ear discomfort, describing a sensation of being 'under water ' with pressure in her ears.  She has a history of colds progressing to a persistent cough lasting weeks, although she did not experience this last year. Currently, she is using Tylenol  and Nyquil for symptom relief. She notes elevated blood sugars, which she attributes to Nyquil and being unwell.  She tested positive for COVID-19 at home a month ago but only experienced fatigue at that time, with no significant symptoms. She tested negative five days later.  She has not used any nasal sprays or other oral medications for her sinuses recently, except for starting [employer] on Friday. She has a history of using a prescription cough syrup without codeine, which she finds effective, but she did not bring the bottle to the appointment.  No shortness of breath but reports fatigue. She regularly uses a nasal saline spray, which she and her husband use up to three times a day.  Physical Exam HEENT: Ears normal with clear fluid behind ears, no bulging or infection.  Mild erythema noted over tympanic membranes CHEST: Cardiovascular exam with  regular rate and rhythm.  Lungs clear to auscultation bilaterally.  Viral URI Assessment & Plan: Acute viral upper respiratory infection with sinus pressure, sore throat, and cough, likely viral etiology. Swabs negative for other infections. Expected improvement in 5-7 days, cough may persist 4-6 weeks. Antibiotics not indicated. Serous otitis media with clear fluid behind eardrums, causing pressure and discomfort. No infection signs. Likely related to sinus issues from viral infection. Expected to improve as viral symptoms resolve. - Prescribed promethazine  dextromethorphan syrup for cough management. - Advised use of honey in tea or alone for cough relief. - If symptoms worsen or fever spikes after 7-10 days, consider bacterial infection and reassess for potential antibiotic treatment. - Advised use of nasal saline spray up to three times a day to alleviate sinus pressure. - Monitor for worsening symptoms or signs of infection, such as increased pain or fever.   Acute cough -     POC COVID-19 BinaxNow -     POCT Influenza A/B  Other orders -     Promethazine -DM; Take 5 mLs by mouth 4 (four) times daily as needed for cough.  Dispense: 118 mL; Refill: 0  Return if symptoms worsen or fail to improve.   ___________________________________________ Brodin Gelpi de [place], MD, ABFM, CAQSM Primary Care and Sports Medicine [facility]

## 2024-03-20 NOTE — Assessment & Plan Note (Signed)
 Acute viral upper respiratory infection with sinus pressure, sore throat, and cough, likely viral etiology. Swabs negative for other infections. Expected improvement in 5-7 days, cough may persist 4-6 weeks. Antibiotics not indicated. Serous otitis media with clear fluid behind eardrums, causing pressure and discomfort. No infection signs. Likely related to sinus issues from viral infection. Expected to improve as viral symptoms resolve. - Prescribed promethazine  dextromethorphan syrup for cough management. - Advised use of honey in tea or alone for cough relief. - If symptoms worsen or fever spikes after 7-10 days, consider bacterial infection and reassess for potential antibiotic treatment. - Advised use of nasal saline spray up to three times a day to alleviate sinus pressure. - Monitor for worsening symptoms or signs of infection, such as increased pain or fever.

## 2024-03-20 NOTE — Patient Instructions (Signed)
  Medication Instructions:  Your physician recommends that you continue on your current medications as directed. Please refer to the Current Medication list given to you today. --If you need a refill on any your medications before your next appointment, please call your pharmacy first. If no refills are authorized on file call the office.--  Follow-Up: Your next appointment:   Your physician recommends that you schedule a follow-up appointment in: as needed   You will receive a text message or e-mail with a link to a survey about your care and experience with us  today! We would greatly appreciate your feedback!   Thanks for letting us  be apart of your health journey!!  Primary Care and Sports Medicine   Dr. Quintin sheerer [place]   We encourage you to activate your patient portal called MyChart.  Sign up information is provided on this After Visit Summary.  MyChart is used to connect with patients for Virtual Visits (Telemedicine).  Patients are able to view lab/test results, encounter notes, upcoming appointments, etc.  Non-urgent messages can be sent to your provider as well. To learn more about what you can do with MyChart, please visit --  [email or web address].

## 2024-03-23 ENCOUNTER — Encounter (HOSPITAL_BASED_OUTPATIENT_CLINIC_OR_DEPARTMENT_OTHER): Payer: Self-pay | Admitting: Family Medicine

## 2024-03-23 ENCOUNTER — Encounter (HOSPITAL_BASED_OUTPATIENT_CLINIC_OR_DEPARTMENT_OTHER): Payer: Self-pay | Admitting: *Deleted

## 2024-03-27 ENCOUNTER — Other Ambulatory Visit (HOSPITAL_BASED_OUTPATIENT_CLINIC_OR_DEPARTMENT_OTHER): Payer: Self-pay | Admitting: Family Medicine

## 2024-03-27 ENCOUNTER — Other Ambulatory Visit: Payer: Self-pay | Admitting: Cardiology

## 2024-03-27 DIAGNOSIS — M797 Fibromyalgia: Secondary | ICD-10-CM

## 2024-03-28 ENCOUNTER — Other Ambulatory Visit: Payer: Self-pay

## 2024-03-28 ENCOUNTER — Ambulatory Visit (HOSPITAL_BASED_OUTPATIENT_CLINIC_OR_DEPARTMENT_OTHER): Admitting: Family Medicine

## 2024-03-28 ENCOUNTER — Other Ambulatory Visit (HOSPITAL_BASED_OUTPATIENT_CLINIC_OR_DEPARTMENT_OTHER): Payer: Self-pay

## 2024-03-28 MED ORDER — TRAMADOL HCL 50 MG PO TABS
50.0000 mg | ORAL_TABLET | Freq: Two times a day (BID) | ORAL | 0 refills | Status: DC | PRN
  Filled 2024-03-28: qty 30, 15d supply, fill #0

## 2024-03-28 MED ORDER — NEXLIZET 180-10 MG PO TABS
1.0000 | ORAL_TABLET | Freq: Every day | ORAL | 0 refills | Status: DC
  Filled 2024-03-28: qty 30, 30d supply, fill #0

## 2024-03-28 NOTE — Telephone Encounter (Signed)
 Pt has an appt today 9/17. Please advise if you are okay refilling her tramadol .

## 2024-03-29 ENCOUNTER — Other Ambulatory Visit (HOSPITAL_BASED_OUTPATIENT_CLINIC_OR_DEPARTMENT_OTHER): Payer: Self-pay

## 2024-04-03 ENCOUNTER — Other Ambulatory Visit (HOSPITAL_BASED_OUTPATIENT_CLINIC_OR_DEPARTMENT_OTHER): Payer: Self-pay

## 2024-04-03 MED ORDER — INSULIN LISPRO (1 UNIT DIAL) 100 UNIT/ML (KWIKPEN)
3.0000 [IU] | PEN_INJECTOR | Freq: Three times a day (TID) | SUBCUTANEOUS | 0 refills | Status: DC
  Filled 2024-04-04: qty 9, 100d supply, fill #0
  Filled 2024-04-05: qty 9, 84d supply, fill #0

## 2024-04-04 ENCOUNTER — Other Ambulatory Visit: Payer: Self-pay

## 2024-04-04 ENCOUNTER — Other Ambulatory Visit (HOSPITAL_BASED_OUTPATIENT_CLINIC_OR_DEPARTMENT_OTHER): Payer: Self-pay

## 2024-04-04 MED FILL — Levocetirizine Dihydrochloride Tab 5 MG: ORAL | 30 days supply | Qty: 30 | Fill #3 | Status: AC

## 2024-04-05 ENCOUNTER — Other Ambulatory Visit (HOSPITAL_BASED_OUTPATIENT_CLINIC_OR_DEPARTMENT_OTHER): Payer: Self-pay

## 2024-04-10 ENCOUNTER — Other Ambulatory Visit (HOSPITAL_BASED_OUTPATIENT_CLINIC_OR_DEPARTMENT_OTHER): Payer: Self-pay

## 2024-04-10 ENCOUNTER — Other Ambulatory Visit (HOSPITAL_BASED_OUTPATIENT_CLINIC_OR_DEPARTMENT_OTHER): Payer: Self-pay | Admitting: Family Medicine

## 2024-04-10 DIAGNOSIS — E139 Other specified diabetes mellitus without complications: Secondary | ICD-10-CM | POA: Diagnosis not present

## 2024-04-10 DIAGNOSIS — E7800 Pure hypercholesterolemia, unspecified: Secondary | ICD-10-CM | POA: Diagnosis not present

## 2024-04-10 LAB — BASIC METABOLIC PANEL WITH GFR
Creatinine: 0.5 (ref 0.5–1.1)
Glucose: 177

## 2024-04-10 LAB — LIPID PANEL
Cholesterol: 210 — AB (ref 0–200)
HDL: 46 (ref 35–70)
LDL Cholesterol: 119
Triglycerides: 258 — AB (ref 40–160)

## 2024-04-10 LAB — HEMOGLOBIN A1C: Hemoglobin A1C: 6.4

## 2024-04-10 LAB — HEPATIC FUNCTION PANEL
ALT: 22 U/L (ref 7–35)
AST: 15 (ref 13–35)
Alkaline Phosphatase: 137 — AB (ref 25–125)

## 2024-04-10 LAB — MICROALBUMIN, URINE: Microalb, Ur: 55.8

## 2024-04-10 LAB — COMPREHENSIVE METABOLIC PANEL WITH GFR: eGFR: 110

## 2024-04-10 LAB — TSH: TSH: 1.29 (ref 0.41–5.90)

## 2024-04-11 ENCOUNTER — Other Ambulatory Visit: Payer: Self-pay

## 2024-04-12 ENCOUNTER — Other Ambulatory Visit: Payer: Self-pay | Admitting: Medical Genetics

## 2024-04-16 ENCOUNTER — Other Ambulatory Visit (HOSPITAL_BASED_OUTPATIENT_CLINIC_OR_DEPARTMENT_OTHER): Payer: Self-pay | Admitting: Family Medicine

## 2024-04-16 DIAGNOSIS — F5101 Primary insomnia: Secondary | ICD-10-CM

## 2024-04-17 ENCOUNTER — Other Ambulatory Visit (HOSPITAL_BASED_OUTPATIENT_CLINIC_OR_DEPARTMENT_OTHER): Payer: Self-pay

## 2024-04-17 DIAGNOSIS — I10 Essential (primary) hypertension: Secondary | ICD-10-CM | POA: Diagnosis not present

## 2024-04-17 DIAGNOSIS — Z9884 Bariatric surgery status: Secondary | ICD-10-CM | POA: Diagnosis not present

## 2024-04-17 DIAGNOSIS — E139 Other specified diabetes mellitus without complications: Secondary | ICD-10-CM | POA: Diagnosis not present

## 2024-04-17 DIAGNOSIS — E7800 Pure hypercholesterolemia, unspecified: Secondary | ICD-10-CM | POA: Diagnosis not present

## 2024-04-17 MED ORDER — ZOLPIDEM TARTRATE 10 MG PO TABS
10.0000 mg | ORAL_TABLET | Freq: Every evening | ORAL | 3 refills | Status: DC | PRN
  Filled 2024-04-17: qty 30, 30d supply, fill #0

## 2024-04-17 MED ORDER — INSULIN LISPRO (1 UNIT DIAL) 100 UNIT/ML (KWIKPEN)
10.0000 [IU] | PEN_INJECTOR | Freq: Three times a day (TID) | SUBCUTANEOUS | 5 refills | Status: AC
  Filled 2024-05-03 – 2024-05-04 (×2): qty 15, 28d supply, fill #0
  Filled 2024-05-07: qty 15, 33d supply, fill #0
  Filled 2024-05-09 – 2024-05-10 (×3): qty 15, 28d supply, fill #0
  Filled 2024-06-04: qty 15, 28d supply, fill #1
  Filled 2024-06-30: qty 15, 28d supply, fill #2
  Filled 2024-07-30: qty 15, 28d supply, fill #3

## 2024-04-19 ENCOUNTER — Ambulatory Visit (INDEPENDENT_AMBULATORY_CARE_PROVIDER_SITE_OTHER): Admitting: Family Medicine

## 2024-04-19 ENCOUNTER — Other Ambulatory Visit (HOSPITAL_BASED_OUTPATIENT_CLINIC_OR_DEPARTMENT_OTHER): Payer: Self-pay

## 2024-04-19 ENCOUNTER — Encounter (HOSPITAL_BASED_OUTPATIENT_CLINIC_OR_DEPARTMENT_OTHER): Payer: Self-pay | Admitting: Family Medicine

## 2024-04-19 VITALS — BP 120/59 | HR 69 | Ht 61.0 in | Wt 185.0 lb

## 2024-04-19 DIAGNOSIS — J018 Other acute sinusitis: Secondary | ICD-10-CM | POA: Diagnosis not present

## 2024-04-19 DIAGNOSIS — Z794 Long term (current) use of insulin: Secondary | ICD-10-CM | POA: Diagnosis not present

## 2024-04-19 DIAGNOSIS — E119 Type 2 diabetes mellitus without complications: Secondary | ICD-10-CM | POA: Diagnosis not present

## 2024-04-19 DIAGNOSIS — Z23 Encounter for immunization: Secondary | ICD-10-CM

## 2024-04-19 DIAGNOSIS — Z9884 Bariatric surgery status: Secondary | ICD-10-CM

## 2024-04-19 MED ORDER — AZITHROMYCIN 250 MG PO TABS
ORAL_TABLET | ORAL | 0 refills | Status: AC
  Filled 2024-04-19: qty 6, 5d supply, fill #0

## 2024-04-19 NOTE — Progress Notes (Signed)
 Subjective:   Megan Murray 19-Jun-1966 04/19/2024  Chief Complaint  Patient presents with   Medical Management of Chronic Issues    42-month follow up; states she was seen beginning September for URI but prior to being seen stated she tested positive for covid August. Covid and flu were negative at [facility] visit. Since that visit, pt has been feeling like her ears are full and having a lot of pressure.     HPI: LEILANI CESPEDES presents today for re-assessment and management of chronic medical conditions.   WEIGHT MANAGEMENT: Eknoor K Richoux presents for weight management. She is s/p gastric surgery. She has follow up annually with her surgeon. Denies need for repeat of labs including vitamins today.  Adhering to healthy diet: Yes Regular exercise regimen: Yes, walking 3-5 miles daily ,taking care of grandchild  Wt Readings from Last 3 Encounters:  04/19/24 185 lb (83.9 kg)  03/20/24 188 lb 8 oz (85.5 kg)  09/26/23 187 lb 12.8 oz (85.2 kg)    DIABETES MELLITUS: Stephinie Battisti Schrom presents for the medical management of diabetes. She is currently managed by Endocrinology with Dr. Tommas. She did see her endocrinologist 2 days ago. A1C was 6.4 and no medication changes needed. Will review labs in Care Everywhere. She sees Dr. Tobie (retina specialist) every 4 months as retinal specialist.   Current diabetes medication regimen: Insulin  Humalog , Tresiba , Freestyle Libre Patient is  adhering to a diabetic diet.  Patient is  exercising regularly.  Patient is  checking BS regularly. Patient is  checking their feet regularly.  Denies polydipsia, polyphagia, polyuria, open wounds or ulcers on feet.    Lab Results  Component Value Date   HGBA1C 6.4 04/10/2024    Foot Exam: 09/26/2023 Lab Results  Component Value Date   MICROALBUR 55.8 04/10/2024    Wt Readings from Last 3 Encounters:  04/19/24 185 lb (83.9 kg)  03/20/24 188 lb 8 oz (85.5 kg)  09/26/23 187 lb 12.8 oz (85.2 kg)      URI:  Patient states she had COVID in August and had a URI in September. She states she is having some fluid on her right ear and a recurrent cough. She denies fever, chills, N/V/D or shortness of breath. Denies recent travel.   The following portions of the patient's history were reviewed and updated as appropriate: past medical history, past surgical history, family history, social history, allergies, medications, and problem list.   Patient Active Problem List   Diagnosis Date Noted   Anxiety    Primary insomnia 02/14/2023   Gastric bypass status for obesity 09/21/2022   Asthma 05/05/2022   Joint pain 05/05/2022   Pancreatic disease 05/05/2022   Vitamin D  deficiency 01/20/2022   Coronary artery disease moderate based on coronary CT angio of circumflex 04/29/2021   Obesity due to excess calories without serious comorbidity 02/19/2021   Myofascial pain 11/12/2019   Primary osteoarthritis of left knee 11/12/2019   Primary osteoarthritis of right knee 11/12/2019   Gastroesophageal reflux disease without esophagitis 04/26/2019   Seasonal allergic rhinitis due to pollen 05/19/2018   Fatty liver 05/16/2018   Fibromyalgia 04/25/2018   Major depression, recurrent, chronic 04/25/2018   Osteoarthritis, multiple sites 04/25/2018   Type 2 diabetes mellitus without complication, with [place]-term current use of insulin  (HCC) 11/09/2016   Essential hypertension 11/09/2016   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   Mixed hyperlipidemia 11/04/2016   B12 deficiency 01/23/2015   Past Medical History:  Diagnosis Date  Anxiety    Asthma    B12 deficiency 01/23/2015   Contusion of left wrist 07/29/2015   Contusion of left wrist 07/29/2015   Coronary artery disease moderate based on coronary CT angio of circumflex 04/29/2021   Essential hypertension 11/09/2016   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   Fatty liver 05/16/2018   By CT scan   Fatty liver    Fibromyalgia    Gastric  bypass status for obesity 09/21/2022   Gastroesophageal reflux disease without esophagitis 04/26/2019   Hypertension associated with diabetes (HCC) 11/09/2016   Joint pain    Major depression, recurrent, chronic 04/25/2018   Dr. Vincente   Mixed hyperlipidemia 11/04/2016   Failed statins; hyperglycemia with repatha . Stopped 06/2018    Myofascial pain 11/12/2019   Neck pain 02/14/2023   Obesity due to excess calories without serious comorbidity    Osteoarthritis, multiple sites 04/25/2018   Pain in toe 08/20/2020   Pancreatic disease    Pneumonia    PONV (postoperative nausea and vomiting)    Primary insomnia 02/14/2023   Primary osteoarthritis of left knee 11/12/2019   Primary osteoarthritis of right knee 11/12/2019   Prolonged QT interval 09/27/2021   Seasonal allergic rhinitis due to pollen 05/19/2018   Statin intolerance 06/21/2018   Type 2 diabetes mellitus without complication, without [place]-term current use of insulin  (HCC) 11/09/2016   Vitamin D  deficiency    Past Surgical History:  Procedure Laterality Date   CESAREAN SECTION  1994   GALLBLADDER SURGERY  1995   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   Procedure: LAPAROSCOPIC ROUX-EN-Y GASTRIC BYPASS WITH UPPER ENDOSCOPY, LYSIS OF ADHESIONS X 30 MIN;  Surgeon: Tanda Locus, MD;  Location: [facility];  Service: General;  Laterality: N/A;   REDUCTION MAMMAPLASTY     TOTAL ABDOMINAL HYSTERECTOMY     Family History  Problem Relation Age of Onset   Heart disease Mother    Hypertension Mother    COPD Mother        Died at 59   Heart failure Mother        Not sure of details   Depression Mother    Liver disease Father    Esophageal cancer Father        Survived cancer treatment and surgery   Drug abuse Father    Cirrhosis Father        NAFLD   Diabetes Mellitus II Sister    Heart attack Sister    Diabetes Brother    Depression Brother    Heart attack Brother    Heart disease Brother    Diabetes Maternal Grandmother     Arthritis Maternal Grandmother    Diabetes Maternal Grandfather    Cancer Paternal Grandmother    Hearing loss Paternal Grandmother    Liver disease Paternal Grandfather    Outpatient Medications Prior to Visit  Medication Sig Dispense Refill   brimonidine  (ALPHAGAN  P) 0.1 % SOLN Place 1 drop into the right eye 2 (two) times daily. 5 mL 5   Continuous Glucose Sensor (FREESTYLE LIBRE 3 PLUS SENSOR) MISC USe as directed to check blood sugar. Change every 15 days. 2 each 5   FLUoxetine  (PROZAC ) 40 MG capsule Take 1 capsule (40 mg total) by mouth daily. 90 capsule 3   fluticasone -salmeterol (WIXELA INHUB) 250-50 MCG/ACT AEPB Inhale 1 puff into the lungs 2 (two) times daily. 180 each 0   Glucagon  (BAQSIMI  TWO PACK) 3 MG/DOSE POWD Use nasally as directed as needed 2 each 11   glucose blood (  ONETOUCH ULTRA TEST) test strip Use as instructed 100 strip 12   insulin  lispro (HUMALOG  KWIKPEN) 100 UNIT/ML KwikPen Inject 10-18 Units into the skin 3 (three) times daily before meals. 15 mL 5   Insulin  Pen Needle (TECHLITE PEN NEEDLES) 32G X 4 MM MISC Test 4 times a day 400 each 5   levocetirizine (XYZAL ) 5 MG tablet Take 1 tablet (5 mg total) by mouth every evening. 30 tablet 4   losartan  (COZAAR ) 25 MG tablet Take 1 tablet (25 mg total) by mouth daily. 90 tablet 2   neomycin -polymyxin-dexameth (MAXITROL ) 0.1 % OINT Administer small amount into left eye twice daily for 3 days. 3.5 g 0   timolol  (TIMOPTIC ) 0.5 % ophthalmic solution Place 1 drop into the right eye 2 (two) times daily. 5 mL 5   traMADol  (ULTRAM ) 50 MG tablet Take 1 tablet (50 mg total) by mouth 2 (two) times daily as needed. 30 tablet 0   TRESIBA  FLEXTOUCH 200 UNIT/ML FlexTouch Pen Inject  25 units in the morning and 15 units at night 3 mL 2   zolpidem  (AMBIEN ) 10 MG tablet Take 1 tablet (10 mg total) by mouth at bedtime as needed for insomnia 30 tablet 3   Insulin  Disposable Pump (OMNIPOD 5 LIBRE2 PLUS G6 PODS) MISC Use every 3 days as directed  10 each 5   Insulin  Disposable Pump (OMNIPOD 5 LIBRE2 PLUS G6) KIT Use as directed 1 kit 0   Bempedoic Acid -Ezetimibe  (NEXLIZET ) 180-10 MG TABS Take 1 tablet by mouth daily. 30 tablet 0   Continuous Blood Gluc Receiver (FREESTYLE LIBRE 2 READER) DEVI Use as directed 1 each 0   Continuous Glucose Receiver (FREESTYLE LIBRE 3 READER) DEVI Use as directed to check blood sugar 1 each 0   Continuous Glucose Sensor (FREESTYLE LIBRE 2 PLUS SENSOR) MISC Use sensor to continuously monitor blood sugar. Change every 15 days. 2 each 5   Continuous Glucose Sensor (FREESTYLE LIBRE 2 SENSOR) MISC Use 1 sensor as directed to check blood sugar. Change ever 14 days 2 each 5   insulin  lispro (HUMALOG  KWIKPEN) 100 UNIT/ML KwikPen Inject 6 Units into the skin 3 (three) times daily. 15 mL 11   insulin  lispro (HUMALOG  KWIKPEN) 100 UNIT/ML KwikPen Inject 3 Units into the skin 3 (three) times daily. 9 mL 5   insulin  lispro (HUMALOG  KWIKPEN) 100 UNIT/ML KwikPen Inject 3 Units into the skin 3 (three) times daily. 3 mL 5   insulin  lispro (HUMALOG  KWIKPEN) 100 UNIT/ML KwikPen Inject 3 Units subcutaneously into the skin 3 (three) times daily before meals. 9 mL 0   promethazine -dextromethorphan (PROMETHAZINE -DM) 6.25-15 MG/5ML syrup Take 5 mLs by mouth 4 (four) times daily as needed for cough. 118 mL 0   zolpidem  (AMBIEN ) 10 MG tablet Take 1 tablet (10 mg total) by mouth at bedtime as needed for sleep. 30 tablet 3   No facility-administered medications prior to visit.   Allergies  Allergen Reactions   Codeine Palpitations    Natural or Synthetic Codeine   Atorvastatin Nausea Only and Other (See Comments)    Myalgias   Cholestyramine Diarrhea and Nausea Only   Crestor [Rosuvastatin Calcium] Nausea Only and Other (See Comments)    Myalgias   Fenofibrate  Diarrhea and Nausea And Vomiting   Simvastatin Nausea Only and Other (See Comments)    Myalgias   Soy Allergy (Obsolete) Diarrhea and Nausea And Vomiting   Statins Nausea  Only and Other (See Comments)     Myalgias   Benadryl [  Diphenhydramine]    Ozempic  (0.25 Or 0.5 Mg-Dose) [Semaglutide (0.25 Or 0.5mg -Dos)]     Caused pancreatitis   Promethazine  Itching   Repatha  [Evolocumab ] Other (See Comments)    flu   Synjardy  [Empagliflozin -Metformin  Hcl] Other (See Comments)    Caused pancreatitis   Trulicity  [Dulaglutide ] Other (See Comments)    Pancreatitis    Meloxicam Rash     ROS: A complete ROS was performed with pertinent positives/negatives noted in the HPI. The remainder of the ROS are negative.    Objective:   Today's Vitals   04/19/24 1024  BP: (!) 120/59  Pulse: 69  SpO2: 98%  Weight: 185 lb (83.9 kg)  Height: 5' 1 (1.549 m)    Physical Exam   GENERAL: Well-appearing, in NAD. Well nourished.  SKIN: Pink, warm and dry.  Head: Normocephalic. NECK: Trachea midline. Full ROM w/o pain or tenderness. No lymphadenopathy.  EARS: Tympanic membranes are intact, translucent with mild injection to right ear without bulging and without drainage. Appropriate landmarks visualized.  EYES: Conjunctiva clear without exudates. EOMI, PERRL, no drainage present.  NOSE: Septum midline w/o deformity. Nares patent, mucosa pink and non-inflamed w/o drainage. No sinus tenderness.  THROAT: Uvula midline. Oropharynx clear.Mucous membranes pink and moist.  RESPIRATORY: Chest wall symmetrical. Respirations even and non-labored. Breath sounds clear to auscultation bilaterally. Cough is present, dry, non productive.  CARDIAC: S1, S2 present, regular rate and rhythm without murmur or gallops. Peripheral pulses 2+ bilaterally.  MSK: Muscle tone and strength appropriate for age. SABRA  NEUROLOGIC: No motor or sensory deficits. Steady, even gait. C2-C12 intact.  PSYCH/MENTAL STATUS: Alert, oriented x 3. Cooperative, appropriate mood and affect.   Health Maintenance Due  Topic Date Due   OPHTHALMOLOGY EXAM  01/02/2022   Medicare Annual Wellness (AWV)  02/22/2024     Results for orders placed or performed in visit on 04/19/24  Microalbumin, urine  Result Value Ref Range   Microalb, Ur 55.8   Basic metabolic panel with GFR  Result Value Ref Range   Glucose 177    Creatinine 0.5 0.5 - 1.1  Comprehensive metabolic panel with GFR  Result Value Ref Range   eGFR 110   Lipid panel  Result Value Ref Range   Triglycerides 258 (A) 40 - 160   Cholesterol 210 (A) 0 - 200   HDL 46 35 - 70   LDL Cholesterol 119   Hepatic function panel  Result Value Ref Range   Alkaline Phosphatase 137 (A) 25 - 125   ALT 22 7 - 35 U/L   AST 15 13 - 35  Hemoglobin A1c  Result Value Ref Range   Hemoglobin A1C 6.4   TSH  Result Value Ref Range   TSH 1.29 0.41 - 5.90    The 10-year ASCVD risk score (Arnett DK, et al., 2019) is: 7.2%* (Cholesterol units were assumed)     Assessment & Plan:  1. Encounter for immunization (Primary) VIS provided.  - Flu vaccine trivalent PF, 6mos and older(Flulaval,Afluria,Fluarix ,Fluzone)  2. Acute non-recurrent sinusitis of other sinus Start Z-pack as tolerated well in the past. Will continue anthistamin and nasal steroid if needed and follow up with PCP if needed in 1-2 weeks.   3. Type 2 diabetes mellitus without complication, with [place]-term current use of insulin  (HCC) Controlled currently. Will continue management by endocrinology. UTD on retinal exam and urine albumin. Labs abstracted from [facility] into patient's chart.   4. Gastric bypass status for obesity Stable. Declined repeat of labs today.  Doing well with diet and exercise currently.    Meds ordered this encounter  Medications   azithromycin  (ZITHROMAX ) 250 MG tablet    Sig: Take 2 tablets by mouth on day 1, then take 1 tablet daily on days 2 through 5    Dispense:  6 tablet    Refill:  0    Supervising Provider:   DE PERU, RAYMOND J [[id number]]   Lab Orders         Microalbumin, urine         Basic metabolic panel with GFR         Comprehensive metabolic panel  with GFR         Lipid panel         Hepatic function panel         Hemoglobin A1c         TSH      Return in about 6 months (around 10/18/2024) for ANNUAL PHYSICAL.    Patient to reach out to office if new, worrisome, or unresolved symptoms arise or if no improvement in patient's condition. Patient verbalized understanding and is agreeable to treatment plan. All questions answered to patient's satisfaction.    Thersia Schuyler Stark, [place]

## 2024-05-04 ENCOUNTER — Other Ambulatory Visit (HOSPITAL_BASED_OUTPATIENT_CLINIC_OR_DEPARTMENT_OTHER): Payer: Self-pay

## 2024-05-04 ENCOUNTER — Other Ambulatory Visit: Payer: Self-pay

## 2024-05-04 MED ORDER — FREESTYLE LIBRE 3 PLUS SENSOR MISC
5 refills | Status: DC
  Filled 2024-05-04: qty 2, 30d supply, fill #0

## 2024-05-04 MED FILL — Levocetirizine Dihydrochloride Tab 5 MG: ORAL | 30 days supply | Qty: 30 | Fill #4 | Status: AC

## 2024-05-08 ENCOUNTER — Other Ambulatory Visit (HOSPITAL_BASED_OUTPATIENT_CLINIC_OR_DEPARTMENT_OTHER): Payer: Self-pay

## 2024-05-09 ENCOUNTER — Other Ambulatory Visit (HOSPITAL_BASED_OUTPATIENT_CLINIC_OR_DEPARTMENT_OTHER): Payer: Self-pay

## 2024-05-10 ENCOUNTER — Other Ambulatory Visit (HOSPITAL_BASED_OUTPATIENT_CLINIC_OR_DEPARTMENT_OTHER): Payer: Self-pay

## 2024-05-14 ENCOUNTER — Encounter: Payer: Self-pay | Admitting: Radiology

## 2024-05-16 ENCOUNTER — Other Ambulatory Visit: Payer: Self-pay

## 2024-05-18 ENCOUNTER — Other Ambulatory Visit (HOSPITAL_BASED_OUTPATIENT_CLINIC_OR_DEPARTMENT_OTHER): Payer: Self-pay | Admitting: Family Medicine

## 2024-05-18 ENCOUNTER — Other Ambulatory Visit (HOSPITAL_BASED_OUTPATIENT_CLINIC_OR_DEPARTMENT_OTHER): Payer: Self-pay

## 2024-05-18 MED ORDER — FREESTYLE LIBRE 2 PLUS SENSOR MISC
5 refills | Status: AC
  Filled 2024-05-18 – 2024-05-28 (×3): qty 2, 30d supply, fill #0
  Filled 2024-06-26: qty 2, 30d supply, fill #1
  Filled 2024-07-25: qty 2, 30d supply, fill #2

## 2024-05-18 MED ORDER — FREESTYLE LIBRE 2 PLUS SENSOR MISC
0 refills | Status: DC
  Filled 2024-05-18: qty 2, 28d supply, fill #0

## 2024-05-28 ENCOUNTER — Other Ambulatory Visit (HOSPITAL_BASED_OUTPATIENT_CLINIC_OR_DEPARTMENT_OTHER): Payer: Self-pay

## 2024-06-01 ENCOUNTER — Other Ambulatory Visit (HOSPITAL_BASED_OUTPATIENT_CLINIC_OR_DEPARTMENT_OTHER): Payer: Self-pay

## 2024-06-04 ENCOUNTER — Other Ambulatory Visit: Payer: Self-pay

## 2024-06-04 ENCOUNTER — Other Ambulatory Visit (HOSPITAL_BASED_OUTPATIENT_CLINIC_OR_DEPARTMENT_OTHER): Payer: Self-pay

## 2024-06-04 ENCOUNTER — Other Ambulatory Visit (HOSPITAL_BASED_OUTPATIENT_CLINIC_OR_DEPARTMENT_OTHER): Payer: Self-pay | Admitting: Family Medicine

## 2024-06-04 ENCOUNTER — Other Ambulatory Visit: Payer: Self-pay | Admitting: Medical

## 2024-06-04 DIAGNOSIS — E1159 Type 2 diabetes mellitus with other circulatory complications: Secondary | ICD-10-CM

## 2024-06-04 MED ORDER — LOSARTAN POTASSIUM 25 MG PO TABS
25.0000 mg | ORAL_TABLET | Freq: Every day | ORAL | 2 refills | Status: AC
  Filled 2024-06-04 – 2024-06-08 (×2): qty 90, 90d supply, fill #0

## 2024-06-08 ENCOUNTER — Other Ambulatory Visit: Payer: Self-pay

## 2024-06-08 ENCOUNTER — Other Ambulatory Visit (HOSPITAL_COMMUNITY): Payer: Self-pay

## 2024-06-08 ENCOUNTER — Other Ambulatory Visit: Payer: Self-pay | Admitting: Medical

## 2024-06-14 ENCOUNTER — Other Ambulatory Visit: Payer: Self-pay | Admitting: Medical

## 2024-06-15 ENCOUNTER — Other Ambulatory Visit: Payer: Self-pay

## 2024-06-21 ENCOUNTER — Other Ambulatory Visit

## 2024-06-23 ENCOUNTER — Encounter (HOSPITAL_BASED_OUTPATIENT_CLINIC_OR_DEPARTMENT_OTHER): Payer: Self-pay

## 2024-06-23 ENCOUNTER — Other Ambulatory Visit (HOSPITAL_BASED_OUTPATIENT_CLINIC_OR_DEPARTMENT_OTHER): Payer: Self-pay

## 2024-06-26 ENCOUNTER — Other Ambulatory Visit: Payer: Self-pay | Admitting: Medical

## 2024-06-27 ENCOUNTER — Other Ambulatory Visit (HOSPITAL_BASED_OUTPATIENT_CLINIC_OR_DEPARTMENT_OTHER): Payer: Self-pay

## 2024-06-27 ENCOUNTER — Other Ambulatory Visit: Payer: Self-pay

## 2024-06-27 MED ORDER — LEVOCETIRIZINE DIHYDROCHLORIDE 5 MG PO TABS
5.0000 mg | ORAL_TABLET | Freq: Every evening | ORAL | 4 refills | Status: AC
  Filled 2024-06-27: qty 30, 30d supply, fill #0
  Filled 2024-07-25: qty 30, 30d supply, fill #1

## 2024-06-30 ENCOUNTER — Other Ambulatory Visit (HOSPITAL_BASED_OUTPATIENT_CLINIC_OR_DEPARTMENT_OTHER): Payer: Self-pay

## 2024-07-02 ENCOUNTER — Encounter (HOSPITAL_BASED_OUTPATIENT_CLINIC_OR_DEPARTMENT_OTHER): Payer: Self-pay

## 2024-07-02 ENCOUNTER — Telehealth (HOSPITAL_BASED_OUTPATIENT_CLINIC_OR_DEPARTMENT_OTHER): Payer: Self-pay | Admitting: Family Medicine

## 2024-07-02 NOTE — Telephone Encounter (Signed)
 Copied from CRM #[id number]. Topic: Medicare AWV >> Jul 02, 2024  3:22 PM [name] wrote: Called 07/02/2024 to sched AWV - NO VOICEMAIL  [name]; Care Guide Ambulatory Clinical Support [place] [facility] Direct Dial : [phone]

## 2024-07-03 ENCOUNTER — Other Ambulatory Visit (HOSPITAL_BASED_OUTPATIENT_CLINIC_OR_DEPARTMENT_OTHER): Payer: Self-pay

## 2024-07-10 ENCOUNTER — Other Ambulatory Visit (HOSPITAL_BASED_OUTPATIENT_CLINIC_OR_DEPARTMENT_OTHER): Payer: Self-pay

## 2024-07-10 ENCOUNTER — Other Ambulatory Visit: Payer: Self-pay

## 2024-07-11 ENCOUNTER — Other Ambulatory Visit (HOSPITAL_BASED_OUTPATIENT_CLINIC_OR_DEPARTMENT_OTHER): Payer: Self-pay

## 2024-07-13 ENCOUNTER — Other Ambulatory Visit: Payer: Self-pay

## 2024-07-17 ENCOUNTER — Other Ambulatory Visit (HOSPITAL_BASED_OUTPATIENT_CLINIC_OR_DEPARTMENT_OTHER): Payer: Self-pay

## 2024-07-25 ENCOUNTER — Other Ambulatory Visit: Payer: Self-pay | Admitting: Family Medicine

## 2024-07-25 ENCOUNTER — Other Ambulatory Visit (HOSPITAL_BASED_OUTPATIENT_CLINIC_OR_DEPARTMENT_OTHER): Payer: Self-pay | Admitting: Family Medicine

## 2024-07-25 ENCOUNTER — Other Ambulatory Visit (HOSPITAL_BASED_OUTPATIENT_CLINIC_OR_DEPARTMENT_OTHER): Payer: Self-pay

## 2024-07-25 DIAGNOSIS — M797 Fibromyalgia: Secondary | ICD-10-CM

## 2024-07-25 MED ORDER — TRAMADOL HCL 50 MG PO TABS: 50.0000 mg | ORAL_TABLET | Freq: Two times a day (BID) | ORAL | 0 refills | Status: DC | PRN

## 2024-07-25 MED ORDER — TRESIBA FLEXTOUCH 200 UNIT/ML ~~LOC~~ SOPN
25.0000 [IU] | PEN_INJECTOR | SUBCUTANEOUS | 2 refills | Status: AC
  Filled 2024-07-25: qty 3, 15d supply, fill #0
  Filled 2024-08-06: qty 3, 15d supply, fill #1

## 2024-07-25 NOTE — Telephone Encounter (Signed)
 Refill request was received for pt's tresiba  and tramadol . Accidentally signed encounter when meant to route the tramadol  Rx to provider. Routing to [place] for review of refill request and if approved, so it can be electronically sent to the pharmacy.

## 2024-07-26 ENCOUNTER — Other Ambulatory Visit: Payer: Self-pay

## 2024-07-26 ENCOUNTER — Other Ambulatory Visit (HOSPITAL_BASED_OUTPATIENT_CLINIC_OR_DEPARTMENT_OTHER): Payer: Self-pay

## 2024-07-27 ENCOUNTER — Other Ambulatory Visit (HOSPITAL_BASED_OUTPATIENT_CLINIC_OR_DEPARTMENT_OTHER): Payer: Self-pay

## 2024-07-30 ENCOUNTER — Ambulatory Visit (HOSPITAL_BASED_OUTPATIENT_CLINIC_OR_DEPARTMENT_OTHER)
Admission: RE | Admit: 2024-07-30 | Discharge: 2024-07-30 | Disposition: A | Discharge status: Home / Self Care | Source: Ambulatory Visit | Attending: Family Medicine | Admitting: Family Medicine

## 2024-07-30 ENCOUNTER — Encounter (HOSPITAL_BASED_OUTPATIENT_CLINIC_OR_DEPARTMENT_OTHER): Payer: Self-pay | Admitting: Radiology

## 2024-07-30 ENCOUNTER — Other Ambulatory Visit (HOSPITAL_BASED_OUTPATIENT_CLINIC_OR_DEPARTMENT_OTHER): Payer: Self-pay | Admitting: Family Medicine

## 2024-07-30 ENCOUNTER — Other Ambulatory Visit (HOSPITAL_BASED_OUTPATIENT_CLINIC_OR_DEPARTMENT_OTHER): Payer: Self-pay

## 2024-07-30 DIAGNOSIS — Z1231 Encounter for screening mammogram for malignant neoplasm of breast: Secondary | ICD-10-CM | POA: Diagnosis present

## 2024-07-30 MED ORDER — TRAMADOL HCL 50 MG PO TABS
50.0000 mg | ORAL_TABLET | Freq: Two times a day (BID) | ORAL | 0 refills | Status: AC | PRN
  Filled 2024-07-30: qty 14, 7d supply, fill #0

## 2024-08-01 ENCOUNTER — Ambulatory Visit (HOSPITAL_BASED_OUTPATIENT_CLINIC_OR_DEPARTMENT_OTHER): Payer: Self-pay | Admitting: Family Medicine

## 2024-08-01 NOTE — Progress Notes (Signed)
 Noa,   Your mammogram results show no evidence of breast cancer. We will plan to repeat this in 1 year for routine screening.  If you should have concerns or changes in your breasts within the next year, please let us  know.   Thersia Stark, FNP-C

## 2024-08-07 ENCOUNTER — Other Ambulatory Visit: Payer: Self-pay | Admitting: Medical Genetics

## 2024-08-07 ENCOUNTER — Other Ambulatory Visit (HOSPITAL_BASED_OUTPATIENT_CLINIC_OR_DEPARTMENT_OTHER): Payer: Self-pay

## 2024-08-07 DIAGNOSIS — Z006 Encounter for examination for normal comparison and control in clinical research program: Secondary | ICD-10-CM

## 2024-08-11 ENCOUNTER — Other Ambulatory Visit (HOSPITAL_BASED_OUTPATIENT_CLINIC_OR_DEPARTMENT_OTHER): Payer: Self-pay | Admitting: Family Medicine

## 2024-08-11 DIAGNOSIS — F5101 Primary insomnia: Secondary | ICD-10-CM

## 2024-08-12 MED ORDER — ZOLPIDEM TARTRATE 10 MG PO TABS
10.0000 mg | ORAL_TABLET | Freq: Every day | ORAL | 3 refills | Status: AC
  Filled 2024-08-12: qty 30, 30d supply, fill #0

## 2024-08-13 ENCOUNTER — Other Ambulatory Visit (HOSPITAL_BASED_OUTPATIENT_CLINIC_OR_DEPARTMENT_OTHER): Payer: Self-pay

## 2024-08-14 ENCOUNTER — Encounter (HOSPITAL_BASED_OUTPATIENT_CLINIC_OR_DEPARTMENT_OTHER): Admitting: Radiology

## 2024-08-14 DIAGNOSIS — Z1231 Encounter for screening mammogram for malignant neoplasm of breast: Secondary | ICD-10-CM

## 2024-08-24 ENCOUNTER — Other Ambulatory Visit (HOSPITAL_COMMUNITY): Payer: Self-pay

## 2024-10-18 ENCOUNTER — Encounter (HOSPITAL_BASED_OUTPATIENT_CLINIC_OR_DEPARTMENT_OTHER): Admitting: Family Medicine
